# Patient Record
Sex: Male | Born: 1955 | Race: White | Hispanic: No | Smoking: Former smoker
Health system: Southern US, Community
[De-identification: ages and names within clinical notes are randomized; demographics above are authoritative.]

## PROBLEM LIST (undated history)

## (undated) DIAGNOSIS — E1142 Type 2 diabetes mellitus with diabetic polyneuropathy: Secondary | ICD-10-CM

## (undated) DIAGNOSIS — I509 Heart failure, unspecified: Secondary | ICD-10-CM

## (undated) DIAGNOSIS — N189 Chronic kidney disease, unspecified: Secondary | ICD-10-CM

## (undated) DIAGNOSIS — I219 Acute myocardial infarction, unspecified: Secondary | ICD-10-CM

## (undated) DIAGNOSIS — Z5189 Encounter for other specified aftercare: Secondary | ICD-10-CM

## (undated) DIAGNOSIS — E119 Type 2 diabetes mellitus without complications: Secondary | ICD-10-CM

## (undated) DIAGNOSIS — I10 Essential (primary) hypertension: Secondary | ICD-10-CM

## (undated) DIAGNOSIS — H269 Unspecified cataract: Secondary | ICD-10-CM

## (undated) DIAGNOSIS — E079 Disorder of thyroid, unspecified: Secondary | ICD-10-CM

## (undated) DIAGNOSIS — I4891 Unspecified atrial fibrillation: Secondary | ICD-10-CM

## (undated) DIAGNOSIS — Q249 Congenital malformation of heart, unspecified: Secondary | ICD-10-CM

## (undated) DIAGNOSIS — E7800 Pure hypercholesterolemia, unspecified: Secondary | ICD-10-CM

## (undated) HISTORY — DX: Disorder of thyroid, unspecified: E07.9

## (undated) HISTORY — DX: Essential (primary) hypertension: I10

## (undated) HISTORY — DX: Chronic kidney disease, unspecified: N18.9

## (undated) HISTORY — DX: Heart failure, unspecified: I50.9

## (undated) HISTORY — DX: Unspecified atrial fibrillation: I48.91

## (undated) HISTORY — DX: Pure hypercholesterolemia, unspecified: E78.00

## (undated) HISTORY — DX: Encounter for other specified aftercare: Z51.89

## (undated) HISTORY — DX: Acute myocardial infarction, unspecified: I21.9

## (undated) HISTORY — DX: Congenital malformation of heart, unspecified: Q24.9

## (undated) HISTORY — DX: Unspecified cataract: H26.9

## (undated) HISTORY — PX: OTHER SURGICAL HISTORY: SHX169

---

## 2016-07-07 ENCOUNTER — Emergency Department (HOSPITAL_COMMUNITY)
Admission: EM | Admit: 2016-07-07 | Discharge: 2016-07-07 | Disposition: A | Discharge status: Home / Self Care | Payer: BLUE CROSS/BLUE SHIELD | Attending: Emergency Medicine | Admitting: Emergency Medicine

## 2016-07-07 ENCOUNTER — Emergency Department (HOSPITAL_COMMUNITY): Payer: BLUE CROSS/BLUE SHIELD

## 2016-07-07 ENCOUNTER — Encounter (HOSPITAL_COMMUNITY): Payer: Self-pay | Admitting: Emergency Medicine

## 2016-07-07 DIAGNOSIS — Z7984 Long term (current) use of oral hypoglycemic drugs: Secondary | ICD-10-CM | POA: Insufficient documentation

## 2016-07-07 DIAGNOSIS — Z87891 Personal history of nicotine dependence: Secondary | ICD-10-CM | POA: Diagnosis not present

## 2016-07-07 DIAGNOSIS — M545 Low back pain, unspecified: Secondary | ICD-10-CM

## 2016-07-07 DIAGNOSIS — R739 Hyperglycemia, unspecified: Secondary | ICD-10-CM | POA: Insufficient documentation

## 2016-07-07 DIAGNOSIS — R109 Unspecified abdominal pain: Secondary | ICD-10-CM | POA: Diagnosis not present

## 2016-07-07 LAB — CBC WITH DIFFERENTIAL/PLATELET
BASOS PCT: 1 %
Basophils Absolute: 0.1 10*3/uL (ref 0.0–0.1)
EOS ABS: 0.3 10*3/uL (ref 0.0–0.7)
EOS PCT: 3 %
HCT: 42.5 % (ref 39.0–52.0)
Hemoglobin: 15.2 g/dL (ref 13.0–17.0)
LYMPHS PCT: 21 %
Lymphs Abs: 1.8 10*3/uL (ref 0.7–4.0)
MCH: 31.4 pg (ref 26.0–34.0)
MCHC: 35.8 g/dL (ref 30.0–36.0)
MCV: 87.8 fL (ref 78.0–100.0)
MONO ABS: 0.4 10*3/uL (ref 0.1–1.0)
Monocytes Relative: 5 %
Neutro Abs: 6.1 10*3/uL (ref 1.7–7.7)
Neutrophils Relative %: 70 %
PLATELETS: 203 10*3/uL (ref 150–400)
RBC: 4.84 MIL/uL (ref 4.22–5.81)
RDW: 12.9 % (ref 11.5–15.5)
WBC: 8.6 10*3/uL (ref 4.0–10.5)

## 2016-07-07 LAB — URINALYSIS, ROUTINE W REFLEX MICROSCOPIC
BACTERIA UA: NONE SEEN
BILIRUBIN URINE: NEGATIVE
Glucose, UA: >=500 mg/dL — AB
Ketones, ur: 5 mg/dL — AB
Leukocytes, UA: NEGATIVE
Nitrite: NEGATIVE
PROTEIN: NEGATIVE mg/dL
SPECIFIC GRAVITY, URINE: 1.029 (ref 1.005–1.030)
WBC UA: NONE SEEN WBC/hpf (ref 0–5)
pH: 5 (ref 5.0–8.0)

## 2016-07-07 LAB — BASIC METABOLIC PANEL
Anion gap: 14 (ref 5–15)
BUN: 19 mg/dL (ref 6–20)
CO2: 22 mmol/L (ref 22–32)
Calcium: 8.8 mg/dL — ABNORMAL LOW (ref 8.9–10.3)
Chloride: 91 mmol/L — ABNORMAL LOW (ref 101–111)
Creatinine, Ser: 1.07 mg/dL (ref 0.61–1.24)
GFR calc Af Amer: >60 mL/min (ref >60)
GLUCOSE: 494 mg/dL — AB (ref 65–99)
POTASSIUM: 4.3 mmol/L (ref 3.5–5.1)
SODIUM: 127 mmol/L — AB (ref 135–145)

## 2016-07-07 LAB — CBG MONITORING, ED
GLUCOSE-CAPILLARY: 430 mg/dL — AB (ref 65–99)
Glucose-Capillary: 503 mg/dL (ref 65–99)

## 2016-07-07 MED ORDER — KETOROLAC TROMETHAMINE 30 MG/ML IJ SOLN
15.0000 mg | Freq: Once | INTRAMUSCULAR | Status: AC
  Administered 2016-07-07: 15 mg via INTRAVENOUS
  Filled 2016-07-07: qty 1

## 2016-07-07 MED ORDER — ONDANSETRON HCL 4 MG/2ML IJ SOLN
4.0000 mg | Freq: Once | INTRAMUSCULAR | Status: AC
  Administered 2016-07-07: 4 mg via INTRAVENOUS
  Filled 2016-07-07: qty 2

## 2016-07-07 MED ORDER — SODIUM CHLORIDE 0.9 % IV BOLUS (SEPSIS)
1000.0000 mL | Freq: Once | INTRAVENOUS | Status: AC
  Administered 2016-07-07: 1000 mL via INTRAVENOUS

## 2016-07-07 MED ORDER — MORPHINE SULFATE (PF) 4 MG/ML IV SOLN
4.0000 mg | Freq: Once | INTRAVENOUS | Status: AC
Start: 2016-07-07 — End: 2016-07-07
  Administered 2016-07-07: 4 mg via INTRAVENOUS
  Filled 2016-07-07: qty 1

## 2016-07-07 MED ORDER — METFORMIN HCL 500 MG PO TABS: 500.0000 mg | ORAL_TABLET | Freq: Two times a day (BID) | ORAL | 0 refills | Status: DC

## 2016-07-07 NOTE — Discharge Instructions (Signed)
Your blood sugar was elevated here today. Please start taking the metformin 2 times a day. If it causes use severe upset stomach may reduced to 1 time a day. Make sure you are avoiding sugars and carbohydrates. As directed to follow-up with a primary care doctor this week or next week please try to make an appointment as soon as possible. CAT scan showed no signs of kidney stones. Your x-rays were normal. This could be nerve pain caused by her sciatica. Motrin and Tylenol for pain. Heating pad to the affected area. Warm compresses. May discuss further treatment with your primary care doctor. Return to the ED if he develops vomiting, fevers, lose control of her bowel or bladder, unable to urinate, numbness in her groin, numbness in her legs or for any reason.

## 2016-07-07 NOTE — ED Provider Notes (Signed)
[facility]EMERGENCY DEPT Provider Note   CSN: [id number] Arrival date & time: 07/07/16  4098     History   Chief Complaint Chief Complaint  Patient presents with  . Leg Pain    HPI LADELL LEA is a 61 y.o. male.  HPI 61 year old Caucasian male with no significant past medical history presents to the ED today with complaints of right-sided lower back pain that radiates to his right buttocks and down to his right thigh. Patient states that the pain started last night. States that it hurts to walk but does not appear his ambulation. He denies any known injury or trauma. He does not have a history of previous low back pain. Nothing makes better. Movement makes the pain worse. Reports associated urgency and frequency with mild irritation when urinating. Denies any hematuria. Patient describes the pain as a stabbing sensation that is constant. Patient denies any fever, chills, headache, vision changes, lightheadedness, dizziness, chest pain, shortness of breath, abdominal pain, nausea, emesis, change in bowel habits, melena, hematochezia, loss of bowel or bladder, urinary retention, saddle paresthesias, lower extremity paresthesias. Patient is able to ambulate with normal gait. Denies any history of IV drug use or cancer. History reviewed. No pertinent past medical history.  There are no active problems to display for this patient.   History reviewed. No pertinent surgical history.     Home Medications    Prior to Admission medications   Medication Sig Start Date End Date Taking? Authorizing Provider  ibuprofen (ADVIL,MOTRIN) 200 MG tablet Take 200 mg by mouth every 6 (six) hours as needed for mild pain.   Yes Historical Provider, MD  metFORMIN (GLUCOPHAGE) 500 MG tablet Take 1 tablet (500 mg total) by mouth 2 (two) times daily with a meal. 07/07/16   Rise Mu, PA-C    Family History No family history on file.  Social History Social History  Substance Use Topics  .  Smoking status: Former Smoker    Types: Cigarettes    Quit date: 64  . Smokeless tobacco: Never Used  . Alcohol use No     Allergies   Patient has no known allergies.   Review of Systems Review of Systems  Constitutional: Negative for chills and fever.  HENT: Negative for congestion.   Eyes: Negative for visual disturbance.  Respiratory: Negative for cough and shortness of breath.   Cardiovascular: Negative for chest pain, palpitations and leg swelling.  Gastrointestinal: Negative for abdominal pain, blood in stool, diarrhea, nausea and vomiting.  Endocrine: Positive for polydipsia and polyuria.  Genitourinary: Positive for frequency and urgency. Negative for dysuria, flank pain and hematuria.  Musculoskeletal: Positive for back pain. Negative for gait problem, neck pain and neck stiffness.  Skin: Negative for wound.  Neurological: Negative for dizziness, syncope, weakness, light-headedness, numbness and headaches.  All other systems reviewed and are negative.    Physical Exam Updated Vital Signs BP 118/56 (BP Location: Right Arm)   Pulse 82   Temp 97.9 F (36.6 C) (Oral)   Resp 16   Ht 5\' 3"  (1.6 m)   Wt 90.3 kg   SpO2 94%   BMI 35.25 kg/m   Physical Exam  Constitutional: He is oriented to person, place, and time. He appears well-developed and well-nourished. No distress.  Nontoxic appearing. Resting comfortably on the bed in no acute distress.  HENT:  Head: Normocephalic and atraumatic.  Mouth/Throat: Oropharynx is clear and moist.  Eyes: Conjunctivae are normal. Right eye exhibits no discharge. Left eye exhibits  no discharge. No scleral icterus.  Neck: Normal range of motion. Neck supple. No thyromegaly present.  Cardiovascular: Normal rate, regular rhythm, normal heart sounds and intact distal pulses.  Exam reveals no gallop and no friction rub.   No murmur heard. Pulmonary/Chest: Effort normal and breath sounds normal. No respiratory distress. He has no  wheezes. He has no rales. He exhibits no tenderness.  Abdominal: Soft. Bowel sounds are normal. He exhibits no distension. There is no tenderness. There is no rebound and no guarding.  No CVA tenderness.  Musculoskeletal: Normal range of motion.  Mild paraspinal tenderness that radiates down to his right buttock. However range of motion does not reproduce pain. Negative straight leg raise test on the right. No midline L-spine or T-spine tenderness. Full range of motion without any pain. No edema, ecchymosis, erythema to the lower sternum raise bilaterally. DP pulses are 2+ bilaterally. Cap refill normal. Sensation intact to sharp/total.  Lymphadenopathy:    He has no cervical adenopathy.  Neurological: He is alert and oriented to person, place, and time. He has normal strength and normal reflexes. No sensory deficit. GCS eye subscore is 4. GCS verbal subscore is 5. GCS motor subscore is 6.  Able to ambulate with normal gait and Romberg negative.  Skin: Skin is warm and dry. Capillary refill takes less than 2 seconds.  Nursing note and vitals reviewed.    ED Treatments / Results  Labs (all labs ordered are listed, but only abnormal results are displayed) Labs Reviewed  URINALYSIS, ROUTINE W REFLEX MICROSCOPIC - Abnormal; Notable for the following:       Result Value   Color, Urine STRAW (*)    Glucose, UA >=500 (*)    Hgb urine dipstick SMALL (*)    Ketones, ur 5 (*)    Squamous Epithelial / LPF 0-5 (*)    All other components within normal limits  BASIC METABOLIC PANEL - Abnormal; Notable for the following:    Sodium 127 (*)    Chloride 91 (*)    Glucose, Bld 494 (*)    Calcium 8.8 (*)    All other components within normal limits  CBG MONITORING, ED - Abnormal; Notable for the following:    Glucose-Capillary 503 (*)    All other components within normal limits  CBG MONITORING, ED - Abnormal; Notable for the following:    Glucose-Capillary 430 (*)    All other components within  normal limits  CBC WITH DIFFERENTIAL/PLATELET    EKG  EKG Interpretation None       Radiology Dg Lumbar Spine Complete  Result Date: 07/07/2016 CLINICAL DATA:  Lumbago with right-sided radicular symptoms EXAM: LUMBAR SPINE - COMPLETE 4+ VIEW COMPARISON:  None. FINDINGS: Frontal, lateral, spot lumbosacral lateral, and bilateral oblique views were obtained. There are 5 non-rib-bearing lumbar type vertebral bodies. S1 vertebra is transitional. There is no fracture or spondylolisthesis. There is mild disc space narrowing at L3-4. There are anterior osteophytes at all levels. There is facet osteoarthritic change at L4-5 and L5-S1 bilaterally. There are foci of atherosclerotic calcification in the aorta. IMPRESSION: Areas of osteoarthritic change. No fracture or spondylolisthesis. There is aortic atherosclerosis. Electronically Signed   By: Bretta BangWilliam  Woodruff III M.D.   On: 07/07/2016 07:57   Ct Renal Stone Study  Result Date: 07/07/2016 CLINICAL DATA:  Right-sided pain.  Evaluate for calculi EXAM: CT ABDOMEN AND PELVIS WITHOUT CONTRAST TECHNIQUE: Multidetector CT imaging of the abdomen and pelvis was performed following the standard protocol without IV  contrast. COMPARISON:  None. FINDINGS: Lower chest:  No contributory findings. Hepatobiliary: Hepatic steatosisNo evidence of biliary obstruction or stone. Pancreas: Unremarkable. Spleen: Unremarkable. Adrenals/Urinary Tract: Negative adrenals. No hydronephrosis or stone. Symmetric nonspecific perinephric stranding. Unremarkable bladder. Stomach/Bowel:  No obstruction. No appendicitis. Vascular/Lymphatic: No acute vascular abnormality. No mass or adenopathy. Reproductive:No pathologic findings. Other: No ascites or pneumoperitoneum. Musculoskeletal: No acute abnormalities. Spondylosis and low lumbar facet arthropathy. IMPRESSION: 1. No acute finding.  No urolithiasis or appendicitis. 2. Hepatic steatosis. Electronically Signed   By: Marnee Spring M.D.    On: 07/07/2016 09:25    Procedures Procedures (including critical care time)  Medications Ordered in ED Medications  morphine 4 MG/ML injection 4 mg (4 mg Intravenous Given 07/07/16 0905)  ondansetron (ZOFRAN) injection 4 mg (4 mg Intravenous Given 07/07/16 0905)  sodium chloride 0.9 % bolus 1,000 mL (0 mLs Intravenous Stopped 07/07/16 1041)  sodium chloride 0.9 % bolus 1,000 mL (0 mLs Intravenous Stopped 07/07/16 1215)  ketorolac (TORADOL) 30 MG/ML injection 15 mg (15 mg Intravenous Given 07/07/16 1214)     Initial Impression / Assessment and Plan / ED Course  I have reviewed the triage vital signs and the nursing notes.  Pertinent labs & imaging results that were available during my care of the patient were reviewed by me and considered in my medical decision making (see chart for details).     Patient presents to the ED with complaints of right lower back pain that radiates to his right buttocks into his right thigh. No associated trauma. No associated edema of the right lower extremity. Patient is able to ambulate but with mild pain. Range of motion does not reproduce pain. Abdominal exam is benign. Lumbar x-ray shows no acute abnormalities. No neurological deficits and normal neuro exam.  Patient can walk but states is painful.  No loss of bowel or bladder control.  No concern for cauda equina.  No fever, night sweats, weight loss, h/o cancer, IVDU. Urine with glucose small amount of hemoglobin and ketones. No signs of urinary tract infection. Patient does not have a history of diabetes. Ordered CBG that showed to be 500. This seems to be new onset for patient. Denies any abdominal pain, nausea, emesis. Does report mild increase in urinary urgency and frequency. Basic labs were obtained to assess for DKA. Glucose 494. Hyponatremia noted however corrected with the hyperglycemia is 133. No leukocytosis noted. Creatinine normal. Anion gap normal at 14. Bicarbonate normal at 22. Doubt DKA. All  other lab normal. Patient given 2 fluid boluses. Patient was given pain medicine was playing resolution in his symptoms. Patient was assessed by Dr. Tanna Savoy who recommended CT renal study due to patient's acute onset and does not have any pain with range of motion of his right hip. CT scan shows no acute abdomen allergies. No concern for kidney stone, appendicitis, metastases to the lumbar region, prostate cancer. Mild improvement of patient's blood sugar after 2 boluses. We'll start patient on metformin 2 times a day. Patient seen and evaluated by Dr. Tanna Savoy who was agreeable with the above plan. Patient is able to cannulate. Feels much improved. We'll give small dose of Toradol in case low back pain is due to radiculopathy. RICE protocol and pain medicine indicated and discussed with patient.  Have encouraged patient to find a high rate care doctor the next 1-2 weeks and follow-up. Have given her strict return precautions. He verbalized understanding the plan of care. He'll stable for discharge. All questions rancher prior  to discharge.      Final Clinical Impressions(s) / ED Diagnoses   Final diagnoses:  Acute right-sided low back pain without sciatica  Elevated blood sugar    New Prescriptions New Prescriptions   METFORMIN (GLUCOPHAGE) 500 MG TABLET    Take 1 tablet (500 mg total) by mouth 2 (two) times daily with a meal.     Rise Mu, PA-C 07/07/16 1240    Gwyneth Sprout, MD 07/07/16 2039

## 2016-07-07 NOTE — ED Triage Notes (Addendum)
Pt from home reports stabbing right sided leg and right buttock/lower back pain that started last night. Pt denies injury. Pt reports it hurts to walk but doesn't impair ambulation. Pt reports some irritation and difficulty when urinating.

## 2016-07-07 NOTE — ED Notes (Signed)
Patient transported to X-ray 

## 2016-07-08 ENCOUNTER — Encounter (HOSPITAL_COMMUNITY): Payer: Self-pay | Admitting: Emergency Medicine

## 2016-07-08 ENCOUNTER — Emergency Department (HOSPITAL_BASED_OUTPATIENT_CLINIC_OR_DEPARTMENT_OTHER)
Admit: 2016-07-08 | Discharge: 2016-07-08 | Disposition: A | Discharge status: Home / Self Care | Payer: BLUE CROSS/BLUE SHIELD

## 2016-07-08 ENCOUNTER — Emergency Department (HOSPITAL_COMMUNITY)
Admission: EM | Admit: 2016-07-08 | Discharge: 2016-07-08 | Disposition: A | Discharge status: Home / Self Care | Payer: BLUE CROSS/BLUE SHIELD | Attending: Emergency Medicine | Admitting: Emergency Medicine

## 2016-07-08 DIAGNOSIS — M79609 Pain in unspecified limb: Secondary | ICD-10-CM | POA: Diagnosis not present

## 2016-07-08 DIAGNOSIS — M79651 Pain in right thigh: Secondary | ICD-10-CM | POA: Insufficient documentation

## 2016-07-08 DIAGNOSIS — M79604 Pain in right leg: Secondary | ICD-10-CM

## 2016-07-08 DIAGNOSIS — Z7984 Long term (current) use of oral hypoglycemic drugs: Secondary | ICD-10-CM | POA: Diagnosis not present

## 2016-07-08 DIAGNOSIS — Z79899 Other long term (current) drug therapy: Secondary | ICD-10-CM | POA: Insufficient documentation

## 2016-07-08 DIAGNOSIS — Z87891 Personal history of nicotine dependence: Secondary | ICD-10-CM | POA: Diagnosis not present

## 2016-07-08 DIAGNOSIS — E119 Type 2 diabetes mellitus without complications: Secondary | ICD-10-CM | POA: Diagnosis not present

## 2016-07-08 MED ORDER — KETOROLAC TROMETHAMINE 60 MG/2ML IM SOLN
30.0000 mg | Freq: Once | INTRAMUSCULAR | Status: AC
  Administered 2016-07-08: 30 mg via INTRAMUSCULAR
  Filled 2016-07-08: qty 2

## 2016-07-08 NOTE — ED Notes (Signed)
Pt states he is ready to leave and is upset by how long he has been waiting. Dr. Moody BruinsIrick aware.

## 2016-07-08 NOTE — ED Notes (Signed)
Patient transported back from Ultrasound 

## 2016-07-08 NOTE — ED Notes (Signed)
Patient transported to Ultrasound 

## 2016-07-08 NOTE — Progress Notes (Signed)
*  PRELIMINARY RESULTS* Vascular Ultrasound Right lower extremity venous duplex has been completed.  Preliminary findings: No evidence of DVT or baker's cyst.   Farrel DemarkJill Eunice, RDMS, RVT  07/08/2016, 6:34 PM

## 2016-07-08 NOTE — ED Triage Notes (Addendum)
Rt groin pain was seen for same yeasterday was told to come back if it returned and it did states feels a swelling there today that was not there yesterday

## 2016-07-08 NOTE — ED Provider Notes (Signed)
[facility]PT Provider Note   CSN: [id number] Arrival date & time: 07/08/16  1339     History   Chief Complaint Chief Complaint  Patient presents with  . Groin Pain    HPI Antonio Cervantes is a 61 y.o. male.  HPI  61 year old male presenting with pain in his right thigh. Of note, patient was seen in the ED yesterday for similar symptoms. At that time he was diagnosed with new onset diabetes and started on metformin. States he's been taking this as prescribed. Yesterday his symptoms were thought to be secondary to a pinched nerve. He had a CT abdomen and pelvis that was negative, had no red flag symptoms of back pain, and was discharged with outpatient follow-up.  Reports that the pain initially started yesterday. Described as starting in the back, shooting down the front of the right thigh. Described as a severe, sharp, electrical sensation that wakes him up. Pain is constant. No numbness or tingling. Denies history of back trauma or back surgeries. No fevers or chills. Denies bowel or bladder incontinence or saddle anesthesia. He states that he initially felt well after leaving the ED yesterday, but once the medicine he was given wore off, the pain restarted so he decided to come back. He is also concerned that he may have a blood clot as he has a friend recently died from this. He denies history of DVT or PE.  History reviewed. No pertinent past medical history.  There are no active problems to display for this patient.   History reviewed. No pertinent surgical history.     Home Medications    Prior to Admission medications   Medication Sig Start Date End Date Taking? Authorizing Provider  ibuprofen (ADVIL,MOTRIN) 200 MG tablet Take 400 mg by mouth every 6 (six) hours as needed (for pain).    Yes Historical Provider, MD  metFORMIN (GLUCOPHAGE) 500 MG tablet Take 1 tablet (500 mg total) by mouth 2 (two) times daily with a meal. 07/07/16  Yes Rise MuKenneth T Leaphart, PA-C     Family History No family history on file.  Social History Social History  Substance Use Topics  . Smoking status: Former Smoker    Types: Cigarettes    Quit date: [id number]  . Smokeless tobacco: Never Used  . Alcohol use No     Allergies   Patient has no known allergies.   Review of Systems Review of Systems  Constitutional: Negative for chills and fever.  HENT: Negative for congestion.   Eyes: Negative for visual disturbance.  Respiratory: Negative for cough and shortness of breath.   Cardiovascular: Negative for chest pain and leg swelling.  Gastrointestinal: Negative for abdominal pain, nausea and vomiting.  Genitourinary: Negative for difficulty urinating, dysuria and frequency.  Musculoskeletal: Positive for back pain (R lower back) and myalgias (R thigh). Negative for arthralgias, gait problem, joint swelling, neck pain and neck stiffness.  Skin: Negative for rash.  Neurological: Negative for dizziness, weakness, light-headedness and headaches.  Psychiatric/Behavioral: Negative for agitation, behavioral problems and confusion.     Physical Exam Updated Vital Signs BP 153/72   Pulse 76   Temp 98.9 F (37.2 C) (Oral)   Resp 16   SpO2 96%   Physical Exam  Constitutional: He is oriented to person, place, and time. He appears well-developed and well-nourished. No distress.  HENT:  Head: Normocephalic and atraumatic.  Right Ear: External ear normal.  Left Ear: External ear normal.  Nose: Nose normal.  Eyes: Conjunctivae are  normal. Pupils are equal, round, and reactive to light. Right eye exhibits no discharge. Left eye exhibits no discharge. No scleral icterus.  Neck: Normal range of motion. Neck supple.  Cardiovascular: Normal rate, regular rhythm, normal heart sounds and intact distal pulses.  Exam reveals no gallop and no friction rub.   No murmur heard. Pulmonary/Chest: Effort normal and breath sounds normal. No respiratory distress. He has no wheezes. He  has no rales.  Abdominal: Soft. Bowel sounds are normal. He exhibits no distension. There is no tenderness.  Musculoskeletal:  TTP over the R perispinous muscles of the lower lumbar spine. No midline TTP over the C, T, or L spine. TTP over the anterior R thigh. TTP of the R groin, with no palpable inguinal hernias.   Neurological: He is alert and oriented to person, place, and time. He exhibits normal muscle tone.  Intact sensation to light touch in the R lower extremity, with no saddle anesthesia.   Skin: Skin is warm and dry. He is not diaphoretic.  Psychiatric: He has a normal mood and affect. His behavior is normal. Judgment and thought content normal.     ED Treatments / Results  Labs (all labs ordered are listed, but only abnormal results are displayed) Labs Reviewed - No data to display  EKG  EKG Interpretation None       Radiology Dg Lumbar Spine Complete  Result Date: 07/07/2016 CLINICAL DATA:  Lumbago with right-sided radicular symptoms EXAM: LUMBAR SPINE - COMPLETE 4+ VIEW COMPARISON:  None. FINDINGS: Frontal, lateral, spot lumbosacral lateral, and bilateral oblique views were obtained. There are 5 non-rib-bearing lumbar type vertebral bodies. S1 vertebra is transitional. There is no fracture or spondylolisthesis. There is mild disc space narrowing at L3-4. There are anterior osteophytes at all levels. There is facet osteoarthritic change at L4-5 and L5-S1 bilaterally. There are foci of atherosclerotic calcification in the aorta. IMPRESSION: Areas of osteoarthritic change. No fracture or spondylolisthesis. There is aortic atherosclerosis. Electronically Signed   By: Bretta Bang III M.D.   On: 07/07/2016 07:57   Ct Renal Stone Study  Result Date: 07/07/2016 CLINICAL DATA:  Right-sided pain.  Evaluate for calculi EXAM: CT ABDOMEN AND PELVIS WITHOUT CONTRAST TECHNIQUE: Multidetector CT imaging of the abdomen and pelvis was performed following the standard protocol without  IV contrast. COMPARISON:  None. FINDINGS: Lower chest:  No contributory findings. Hepatobiliary: Hepatic steatosisNo evidence of biliary obstruction or stone. Pancreas: Unremarkable. Spleen: Unremarkable. Adrenals/Urinary Tract: Negative adrenals. No hydronephrosis or stone. Symmetric nonspecific perinephric stranding. Unremarkable bladder. Stomach/Bowel:  No obstruction. No appendicitis. Vascular/Lymphatic: No acute vascular abnormality. No mass or adenopathy. Reproductive:No pathologic findings. Other: No ascites or pneumoperitoneum. Musculoskeletal: No acute abnormalities. Spondylosis and low lumbar facet arthropathy. IMPRESSION: 1. No acute finding.  No urolithiasis or appendicitis. 2. Hepatic steatosis. Electronically Signed   By: Marnee Spring M.D.   On: 07/07/2016 09:25    Procedures Procedures (including critical care time)  Medications Ordered in ED Medications  ketorolac (TORADOL) injection 30 mg (30 mg Intramuscular Given 07/08/16 1740)     Initial Impression / Assessment and Plan / ED Course  I have reviewed the triage vital signs and the nursing notes.  Pertinent labs & imaging results that were available during my care of the patient were reviewed by me and considered in my medical decision making (see chart for details).     Generally well-appearing. Afebrile and hemodynamically stable. Report of pain that begins in the right lower back and shoots down  the right leg is most consistent with radicular pain, however patient expresses concern for DVT and does have some tenderness to palpation of the right groin. No palpable inguinal hernias. Ultrasound of the right lower extremity ordered. Patient eloped prior to me being able to discuss these results with him in the emergency department, but I was able to find him out in the waiting room, and let him know that his study was negative. I encouraged him to follow up with his primary care physician for referral to physical therapy or a  spine doctor if his symptoms worsen, and to return to the emergency department for any bowel or bladder incontinence, saddle anesthesia, lower extremity weakness, or sudden worsening of his pain, none of which are present today. I have a low suspicion for cord compression or cauda equina.   Care of patient overseen by my attending, Dr. Rhunette Croft.   Final Clinical Impressions(s) / ED Diagnoses   Final diagnoses:  Leg pain, anterior, right    New Prescriptions Discharge Medication List as of 07/08/2016  8:42 PM       Jethro Radke Ernestina Penna, MD 07/09/16 1610    Derwood Kaplan, MD 07/10/16 [phone]

## 2016-07-08 NOTE — Progress Notes (Addendum)
Spoke to RN about vascular order. It was ordered routine and vascular tech not paged, but RN states this does need to be done today. Will get to patient next. 6:05PM  Farrel DemarkJill Eunice, RDMS, RVT 07/08/2016 Vascular lab

## 2016-07-08 NOTE — ED Notes (Signed)
Pt states he understands the risks of leaving without results and further treatment.

## 2016-07-14 ENCOUNTER — Emergency Department (HOSPITAL_COMMUNITY)
Admission: EM | Admit: 2016-07-14 | Discharge: 2016-07-14 | Disposition: A | Discharge status: Home / Self Care | Payer: BLUE CROSS/BLUE SHIELD | Attending: Emergency Medicine | Admitting: Emergency Medicine

## 2016-07-14 ENCOUNTER — Encounter (HOSPITAL_COMMUNITY): Payer: Self-pay

## 2016-07-14 DIAGNOSIS — Z7984 Long term (current) use of oral hypoglycemic drugs: Secondary | ICD-10-CM | POA: Diagnosis not present

## 2016-07-14 DIAGNOSIS — M5416 Radiculopathy, lumbar region: Secondary | ICD-10-CM | POA: Diagnosis not present

## 2016-07-14 DIAGNOSIS — E119 Type 2 diabetes mellitus without complications: Secondary | ICD-10-CM | POA: Insufficient documentation

## 2016-07-14 DIAGNOSIS — R109 Unspecified abdominal pain: Secondary | ICD-10-CM

## 2016-07-14 DIAGNOSIS — Z87891 Personal history of nicotine dependence: Secondary | ICD-10-CM | POA: Diagnosis not present

## 2016-07-14 DIAGNOSIS — M549 Dorsalgia, unspecified: Secondary | ICD-10-CM | POA: Diagnosis present

## 2016-07-14 HISTORY — DX: Type 2 diabetes mellitus without complications: E11.9

## 2016-07-14 LAB — URINALYSIS, MICROSCOPIC (REFLEX)
RBC / HPF: NONE SEEN RBC/hpf (ref 0–5)
WBC UA: NONE SEEN WBC/hpf (ref 0–5)

## 2016-07-14 LAB — URINALYSIS, ROUTINE W REFLEX MICROSCOPIC
BILIRUBIN URINE: NEGATIVE
Hgb urine dipstick: NEGATIVE
KETONES UR: 15 mg/dL — AB
LEUKOCYTES UA: NEGATIVE
NITRITE: NEGATIVE
PH: 5.5 (ref 5.0–8.0)
Protein, ur: NEGATIVE mg/dL
SPECIFIC GRAVITY, URINE: 1.01 (ref 1.005–1.030)

## 2016-07-14 MED ORDER — ACETAMINOPHEN 325 MG PO TABS: 650.0000 mg | ORAL_TABLET | Freq: Four times a day (QID) | ORAL | 0 refills | Status: DC | PRN

## 2016-07-14 MED ORDER — LIDOCAINE 5 % EX OINT
1.0000 | TOPICAL_OINTMENT | CUTANEOUS | 0 refills | Status: DC | PRN
Start: 2016-07-14 — End: 2021-05-23

## 2016-07-14 MED ORDER — LIDOCAINE 5 % EX PTCH
1.0000 | MEDICATED_PATCH | CUTANEOUS | Status: DC
  Administered 2016-07-14: 1 via TRANSDERMAL
  Filled 2016-07-14: qty 1

## 2016-07-14 MED ORDER — NAPROXEN 250 MG PO TABS
500.0000 mg | ORAL_TABLET | Freq: Once | ORAL | Status: AC
  Administered 2016-07-14: 500 mg via ORAL
  Filled 2016-07-14: qty 2

## 2016-07-14 MED ORDER — ACETAMINOPHEN 325 MG PO TABS
650.0000 mg | ORAL_TABLET | Freq: Once | ORAL | Status: AC
  Administered 2016-07-14: 650 mg via ORAL
  Filled 2016-07-14: qty 2

## 2016-07-14 MED ORDER — IBUPROFEN 200 MG PO TABS: 400.0000 mg | ORAL_TABLET | Freq: Four times a day (QID) | ORAL | 0 refills | Status: DC | PRN

## 2016-07-14 NOTE — ED Triage Notes (Signed)
PT is having RIGHT flank/back pain.He was seen multiple times this week for same. He reports pain is sharp and constant. PT states the pain radiates down right leg. Endorses burning with urination

## 2016-07-14 NOTE — Discharge Instructions (Signed)
Likely have lumbar impingement syndrome. Take the medicine prescribed. See your primary care doctor for further evaluation.

## 2016-07-14 NOTE — ED Provider Notes (Signed)
[facility]-EMERGENCY DEPT Provider Note   CSN: [id number] Arrival date & time: 07/14/16  0455     History   Chief Complaint Chief Complaint  Patient presents with  . Back Pain    HPI Antonio Cervantes is a 61 y.o. male.  HPI Pt comes in with cc of back pain. Pt reports that he has been having back pain, R sided for a while now. Pt has pain in the flank region and has pain radiating to the hip and sometimes even down his leg. Pain is constant. No specific aggravating or relieving factors. Pt has no n/v/f/c. Pt has some urinary burning, but he reports it is intermittent.  Pt has no associated numbness, weakness, urinary incontinence, urinary retention, bowel incontinence, pins and needle sensation in the perineal area.    Past Medical History:  Diagnosis Date  . Diabetes mellitus without complication (HCC)     There are no active problems to display for this patient.   History reviewed. No pertinent surgical history.     Home Medications    Prior to Admission medications   Medication Sig Start Date End Date Taking? Authorizing Provider  metFORMIN (GLUCOPHAGE) 500 MG tablet Take 1 tablet (500 mg total) by mouth 2 (two) times daily with a meal. 07/07/16  Yes Rise MuKenneth T Leaphart, PA-C  naproxen (NAPROSYN) 500 MG tablet Take 500 mg by mouth 2 (two) times daily with a meal.   Yes Historical Provider, MD  acetaminophen (TYLENOL) 325 MG tablet Take 2 tablets (650 mg total) by mouth every 6 (six) hours as needed. 07/14/16   Derwood KaplanAnkit Denym Rahimi, MD  ibuprofen (ADVIL,MOTRIN) 200 MG tablet Take 2 tablets (400 mg total) by mouth every 6 (six) hours as needed (for pain). 07/14/16   Derwood KaplanAnkit Kurk Corniel, MD  lidocaine (XYLOCAINE) 5 % ointment Apply 1 application topically as needed. 07/14/16   Derwood KaplanAnkit Adalina Dopson, MD    Family History No family history on file.  Social History Social History  Substance Use Topics  . Smoking status: Former Smoker    Types: Cigarettes    Quit date: [id number]  . Smokeless  tobacco: Never Used  . Alcohol use No     Allergies   Patient has no known allergies.   Review of Systems Review of Systems  Gastrointestinal: Negative for nausea and vomiting.  Musculoskeletal: Positive for back pain.   ROS 10 Systems reviewed and are negative for acute change except as noted in the HPI.      Physical Exam Updated Vital Signs BP (!) 150/75   Pulse 64   Temp 97.7 F (36.5 C) (Oral)   Resp 16   Ht 5\' 3"  (1.6 m)   Wt 199 lb (90.3 kg)   SpO2 95%   BMI 35.25 kg/m   Physical Exam  Constitutional: He is oriented to person, place, and time. He appears well-developed.  HENT:  Head: Normocephalic and atraumatic.  Eyes: Conjunctivae and EOM are normal. Pupils are equal, round, and reactive to light.  Neck: Normal range of motion. Neck supple.  Cardiovascular: Normal rate and regular rhythm.   Pulmonary/Chest: Effort normal and breath sounds normal.  Abdominal: Soft. Bowel sounds are normal. He exhibits no distension and no mass. There is no tenderness. There is no rebound and no guarding.  No reproducible flank tenderness  Genitourinary: Prostate normal.  Genitourinary Comments: Intact rectal tone  Musculoskeletal: He exhibits no deformity.  Pt has tenderness over the lumbar region No step offs, no erythema. Pt has 2+ patellar reflex  bilaterally. Able to discriminate between sharp and dull. Able to ambulate  + passive straight leg raise  Neurological: He is alert and oriented to person, place, and time. No cranial nerve deficit.  Skin: Skin is warm.  Nursing note and vitals reviewed.    ED Treatments / Results  Labs (all labs ordered are listed, but only abnormal results are displayed) Labs Reviewed  URINALYSIS, ROUTINE W REFLEX MICROSCOPIC - Abnormal; Notable for the following:       Result Value   Glucose, UA >=500 (*)    Ketones, ur 15 (*)    All other components within normal limits  URINALYSIS, MICROSCOPIC (REFLEX) - Abnormal; Notable  for the following:    Bacteria, UA RARE (*)    Squamous Epithelial / LPF 0-5 (*)    All other components within normal limits  URINE CULTURE    EKG  EKG Interpretation None       Radiology No results found.  Procedures Procedures (including critical care time)  Medications Ordered in ED Medications  lidocaine (LIDODERM) 5 % 1 patch (1 patch Transdermal Patch Applied 07/14/16 0550)  naproxen (NAPROSYN) tablet 500 mg (500 mg Oral Given 07/14/16 0550)  acetaminophen (TYLENOL) tablet 650 mg (650 mg Oral Given 07/14/16 0550)     Initial Impression / Assessment and Plan / ED Course  I have reviewed the triage vital signs and the nursing notes.  Pertinent labs & imaging results that were available during my care of the patient were reviewed by me and considered in my medical decision making (see chart for details).     Pt comes in with cc of back pain. There is some component of impingement present. 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 Pt also has some urinary complains intermittently - will check UA. Pt's prostate exam was normal.  Advised PCP f/u.  Final Clinical Impressions(s) / ED Diagnoses   Final diagnoses:  Lumbar nerve root impingement  Flank pain    New Prescriptions New Prescriptions   ACETAMINOPHEN (TYLENOL) 325 MG TABLET    Take 2 tablets (650 mg total) by mouth every 6 (six) hours as needed.   LIDOCAINE (XYLOCAINE) 5 % OINTMENT    Apply 1 application topically as needed.     Derwood Kaplan, MD 07/14/16 [phone]

## 2016-07-15 LAB — URINE CULTURE: CULTURE: NO GROWTH

## 2021-05-19 ENCOUNTER — Inpatient Hospital Stay (HOSPITAL_COMMUNITY)
Admission: EM | Admit: 2021-05-19 | Discharge: 2021-05-23 | DRG: 280 | Disposition: A | Discharge status: Home / Self Care | Payer: Medicare Other | Attending: Internal Medicine | Admitting: Internal Medicine

## 2021-05-19 ENCOUNTER — Emergency Department (HOSPITAL_COMMUNITY): Payer: Medicare Other

## 2021-05-19 ENCOUNTER — Encounter (HOSPITAL_COMMUNITY)
Admission: EM | Disposition: A | Discharge status: Home / Self Care | Payer: Self-pay | Source: Home / Self Care | Attending: Internal Medicine

## 2021-05-19 ENCOUNTER — Encounter (HOSPITAL_COMMUNITY): Payer: Self-pay | Admitting: Internal Medicine

## 2021-05-19 DIAGNOSIS — I499 Cardiac arrhythmia, unspecified: Secondary | ICD-10-CM | POA: Diagnosis not present

## 2021-05-19 DIAGNOSIS — E119 Type 2 diabetes mellitus without complications: Secondary | ICD-10-CM

## 2021-05-19 DIAGNOSIS — Z20822 Contact with and (suspected) exposure to covid-19: Secondary | ICD-10-CM | POA: Diagnosis present

## 2021-05-19 DIAGNOSIS — I2510 Atherosclerotic heart disease of native coronary artery without angina pectoris: Secondary | ICD-10-CM | POA: Diagnosis not present

## 2021-05-19 DIAGNOSIS — E871 Hypo-osmolality and hyponatremia: Secondary | ICD-10-CM | POA: Diagnosis present

## 2021-05-19 DIAGNOSIS — N179 Acute kidney failure, unspecified: Secondary | ICD-10-CM | POA: Diagnosis not present

## 2021-05-19 DIAGNOSIS — E1151 Type 2 diabetes mellitus with diabetic peripheral angiopathy without gangrene: Secondary | ICD-10-CM | POA: Diagnosis not present

## 2021-05-19 DIAGNOSIS — E1142 Type 2 diabetes mellitus with diabetic polyneuropathy: Secondary | ICD-10-CM

## 2021-05-19 DIAGNOSIS — I517 Cardiomegaly: Secondary | ICD-10-CM | POA: Diagnosis not present

## 2021-05-19 DIAGNOSIS — E1122 Type 2 diabetes mellitus with diabetic chronic kidney disease: Secondary | ICD-10-CM | POA: Diagnosis present

## 2021-05-19 DIAGNOSIS — J810 Acute pulmonary edema: Secondary | ICD-10-CM | POA: Diagnosis present

## 2021-05-19 DIAGNOSIS — J9601 Acute respiratory failure with hypoxia: Secondary | ICD-10-CM

## 2021-05-19 DIAGNOSIS — E785 Hyperlipidemia, unspecified: Secondary | ICD-10-CM | POA: Diagnosis present

## 2021-05-19 DIAGNOSIS — I214 Non-ST elevation (NSTEMI) myocardial infarction: Secondary | ICD-10-CM | POA: Diagnosis not present

## 2021-05-19 DIAGNOSIS — N183 Chronic kidney disease, stage 3 unspecified: Secondary | ICD-10-CM | POA: Diagnosis not present

## 2021-05-19 DIAGNOSIS — R0789 Other chest pain: Secondary | ICD-10-CM | POA: Diagnosis not present

## 2021-05-19 DIAGNOSIS — E1169 Type 2 diabetes mellitus with other specified complication: Secondary | ICD-10-CM | POA: Diagnosis not present

## 2021-05-19 DIAGNOSIS — I4891 Unspecified atrial fibrillation: Secondary | ICD-10-CM | POA: Diagnosis not present

## 2021-05-19 DIAGNOSIS — Z743 Need for continuous supervision: Secondary | ICD-10-CM | POA: Diagnosis not present

## 2021-05-19 DIAGNOSIS — R6889 Other general symptoms and signs: Secondary | ICD-10-CM | POA: Diagnosis not present

## 2021-05-19 DIAGNOSIS — I110 Hypertensive heart disease with heart failure: Secondary | ICD-10-CM | POA: Diagnosis not present

## 2021-05-19 DIAGNOSIS — I440 Atrioventricular block, first degree: Secondary | ICD-10-CM | POA: Diagnosis present

## 2021-05-19 DIAGNOSIS — M109 Gout, unspecified: Secondary | ICD-10-CM | POA: Diagnosis present

## 2021-05-19 DIAGNOSIS — E039 Hypothyroidism, unspecified: Secondary | ICD-10-CM | POA: Diagnosis present

## 2021-05-19 DIAGNOSIS — R0602 Shortness of breath: Secondary | ICD-10-CM | POA: Diagnosis not present

## 2021-05-19 DIAGNOSIS — Z79899 Other long term (current) drug therapy: Secondary | ICD-10-CM

## 2021-05-19 DIAGNOSIS — Z87891 Personal history of nicotine dependence: Secondary | ICD-10-CM

## 2021-05-19 DIAGNOSIS — I5040 Unspecified combined systolic (congestive) and diastolic (congestive) heart failure: Secondary | ICD-10-CM | POA: Diagnosis not present

## 2021-05-19 DIAGNOSIS — I509 Heart failure, unspecified: Secondary | ICD-10-CM

## 2021-05-19 DIAGNOSIS — I5021 Acute systolic (congestive) heart failure: Secondary | ICD-10-CM | POA: Diagnosis not present

## 2021-05-19 DIAGNOSIS — D72829 Elevated white blood cell count, unspecified: Secondary | ICD-10-CM | POA: Diagnosis present

## 2021-05-19 DIAGNOSIS — I480 Paroxysmal atrial fibrillation: Secondary | ICD-10-CM

## 2021-05-19 DIAGNOSIS — R079 Chest pain, unspecified: Secondary | ICD-10-CM | POA: Diagnosis not present

## 2021-05-19 DIAGNOSIS — I5043 Acute on chronic combined systolic (congestive) and diastolic (congestive) heart failure: Secondary | ICD-10-CM | POA: Diagnosis not present

## 2021-05-19 DIAGNOSIS — I4892 Unspecified atrial flutter: Secondary | ICD-10-CM | POA: Diagnosis not present

## 2021-05-19 HISTORY — DX: Type 2 diabetes mellitus with diabetic polyneuropathy: E11.42

## 2021-05-19 LAB — LIPID PANEL
Cholesterol: 204 mg/dL — ABNORMAL HIGH (ref 0–200)
HDL: 25 mg/dL — ABNORMAL LOW (ref >40)
LDL Cholesterol: 148 mg/dL — ABNORMAL HIGH (ref 0–99)
Total CHOL/HDL Ratio: 8.2 RATIO
Triglycerides: 154 mg/dL — ABNORMAL HIGH (ref <150)
VLDL: 31 mg/dL (ref 0–40)

## 2021-05-19 LAB — COMPREHENSIVE METABOLIC PANEL
ALT: 11 U/L (ref 0–44)
AST: 16 U/L (ref 15–41)
Albumin: 3.5 g/dL (ref 3.5–5.0)
Alkaline Phosphatase: 50 U/L (ref 38–126)
Anion gap: 9 (ref 5–15)
BUN: 15 mg/dL (ref 8–23)
CO2: 18 mmol/L — ABNORMAL LOW (ref 22–32)
Calcium: 8.4 mg/dL — ABNORMAL LOW (ref 8.9–10.3)
Chloride: 103 mmol/L (ref 98–111)
Creatinine, Ser: 1.4 mg/dL — ABNORMAL HIGH (ref 0.61–1.24)
GFR, Estimated: 55 mL/min — ABNORMAL LOW (ref >60)
Glucose, Bld: 283 mg/dL — ABNORMAL HIGH (ref 70–99)
Potassium: 4.4 mmol/L (ref 3.5–5.1)
Sodium: 130 mmol/L — ABNORMAL LOW (ref 135–145)
Total Bilirubin: 0.5 mg/dL (ref 0.3–1.2)
Total Protein: 6.9 g/dL (ref 6.5–8.1)

## 2021-05-19 LAB — CBC WITH DIFFERENTIAL/PLATELET
Abs Immature Granulocytes: 0.04 10*3/uL (ref 0.00–0.07)
Basophils Absolute: 0.2 10*3/uL — ABNORMAL HIGH (ref 0.0–0.1)
Basophils Relative: 2 %
Eosinophils Absolute: 0.6 10*3/uL — ABNORMAL HIGH (ref 0.0–0.5)
Eosinophils Relative: 5 %
HCT: 42.3 % (ref 39.0–52.0)
Hemoglobin: 13.4 g/dL (ref 13.0–17.0)
Immature Granulocytes: 0 %
Lymphocytes Relative: 31 %
Lymphs Abs: 4.1 10*3/uL — ABNORMAL HIGH (ref 0.7–4.0)
MCH: 30.2 pg (ref 26.0–34.0)
MCHC: 31.7 g/dL (ref 30.0–36.0)
MCV: 95.5 fL (ref 80.0–100.0)
Monocytes Absolute: 0.8 10*3/uL (ref 0.1–1.0)
Monocytes Relative: 6 %
Neutro Abs: 7.4 10*3/uL (ref 1.7–7.7)
Neutrophils Relative %: 56 %
Platelets: 285 10*3/uL (ref 150–400)
RBC: 4.43 MIL/uL (ref 4.22–5.81)
RDW: 13.6 % (ref 11.5–15.5)
WBC: 13.2 10*3/uL — ABNORMAL HIGH (ref 4.0–10.5)
nRBC: 0 % (ref 0.0–0.2)

## 2021-05-19 LAB — I-STAT CHEM 8, ED
BUN: 16 mg/dL (ref 8–23)
Calcium, Ion: 1.05 mmol/L — ABNORMAL LOW (ref 1.15–1.40)
Chloride: 103 mmol/L (ref 98–111)
Creatinine, Ser: 1.3 mg/dL — ABNORMAL HIGH (ref 0.61–1.24)
Glucose, Bld: 274 mg/dL — ABNORMAL HIGH (ref 70–99)
HCT: 43 % (ref 39.0–52.0)
Hemoglobin: 14.6 g/dL (ref 13.0–17.0)
Potassium: 4.6 mmol/L (ref 3.5–5.1)
Sodium: 133 mmol/L — ABNORMAL LOW (ref 135–145)
TCO2: 20 mmol/L — ABNORMAL LOW (ref 22–32)

## 2021-05-19 LAB — PROTIME-INR
INR: 1.1 (ref 0.8–1.2)
Prothrombin Time: 14.2 seconds (ref 11.4–15.2)

## 2021-05-19 LAB — BRAIN NATRIURETIC PEPTIDE: B Natriuretic Peptide: 814.9 pg/mL — ABNORMAL HIGH (ref 0.0–100.0)

## 2021-05-19 LAB — RESP PANEL BY RT-PCR (FLU A&B, COVID) ARPGX2
Influenza A by PCR: NEGATIVE
Influenza B by PCR: NEGATIVE
SARS Coronavirus 2 by RT PCR: NEGATIVE

## 2021-05-19 LAB — TROPONIN I (HIGH SENSITIVITY)
Troponin I (High Sensitivity): 17 ng/L (ref <18)
Troponin I (High Sensitivity): 1188 ng/L (ref <18)

## 2021-05-19 LAB — APTT: aPTT: 29 seconds (ref 24–36)

## 2021-05-19 LAB — CBG MONITORING, ED: Glucose-Capillary: 184 mg/dL — ABNORMAL HIGH (ref 70–99)

## 2021-05-19 SURGERY — LEFT HEART CATH AND CORONARY ANGIOGRAPHY
Anesthesia: LOCAL

## 2021-05-19 MED ORDER — ASPIRIN 81 MG PO CHEW
324.0000 mg | CHEWABLE_TABLET | Freq: Once | ORAL | Status: AC
  Administered 2021-05-19: 324 mg via ORAL

## 2021-05-19 MED ORDER — SODIUM CHLORIDE 0.9 % IV SOLN: INTRAVENOUS | Status: DC

## 2021-05-19 MED ORDER — FUROSEMIDE 10 MG/ML IJ SOLN
80.0000 mg | Freq: Once | INTRAMUSCULAR | Status: AC
  Administered 2021-05-19: 80 mg via INTRAVENOUS
  Filled 2021-05-19: qty 8

## 2021-05-19 MED ORDER — HEPARIN (PORCINE) 25000 UT/250ML-% IV SOLN
1250.0000 [IU]/h | INTRAVENOUS | Status: DC
  Administered 2021-05-19: 23:00:00 1000 [IU]/h via INTRAVENOUS
  Filled 2021-05-19 (×2): qty 250

## 2021-05-19 MED ORDER — NITROGLYCERIN IN D5W 200-5 MCG/ML-% IV SOLN: 0.0000 ug/min | INTRAVENOUS | Status: DC

## 2021-05-19 MED ORDER — HEPARIN SODIUM (PORCINE) 5000 UNIT/ML IJ SOLN
4000.0000 [IU] | Freq: Once | INTRAMUSCULAR | Status: AC
  Administered 2021-05-19: 4000 [IU] via INTRAVENOUS

## 2021-05-19 NOTE — Assessment & Plan Note (Signed)
Continue with NIV.  Wean to supplemental oxygen as needed.  Patient does not use supplemental oxygen at home.

## 2021-05-19 NOTE — ED Notes (Signed)
Dr. Imogene Burn ( admitting ) MD notified on elevated Troponin result .

## 2021-05-19 NOTE — ED Notes (Signed)
Admitting MD paged by secretary for RN for update on recent elevated troponin result.

## 2021-05-19 NOTE — Subjective & Objective (Signed)
CC: SOB, chest burning HPI:  66 year old male with a history of type 2 diabetes not taking medications, diabetic peripheral neuropathy presents to the ER today with respiratory distress.  Patient states that he has been having increasing shortness of breath, dyspnea exertion with only walking approximately 35 steps over the past 1 to 2 months.  This is affirmed by his son Antonio Cervantes.  Patient not only walking a few steps without shortness of breath.  Today, the patient was sitting in his chair developed sudden onset of shortness of breath.  This is associate with chest burning.  Never had any pain.  Never had any radiation.  EMS was called.  Initial O2 sats were 84% on room air.  Heart rate was 120.  Patient was noted to be pale and diaphoretic on arrival.  Patient mainly placed on NIV.  Per EDP report, patient almost need to be intubated due to respiratory distress.  Patient denies any recent weight gain.  There is been no lower extremity edema.  Patient has not had a family doctor in several years.  He has a history of diabetes but does not take any medications.  Only medication he takes on a regular basis is indomethacin for reported gout.  Lab evaluation showed a white count of 13  Total cholesterol 204 BNP of 814. BUN of 15, creatinine 1.4 Sodium 130 COVID negative, influenza negative. Chest x-ray positive for pulmonary edema. EKG shows atrial fibrillation.  Initially STEMI doctor was contacted but this was canceled.  [facility] hospitalist contacted for admission.

## 2021-05-19 NOTE — Assessment & Plan Note (Signed)
Chronic. 

## 2021-05-19 NOTE — ED Triage Notes (Addendum)
Pt here via EMS dt SOB and chest tightening beginning at 1600. Pt pale and diaphoretic on arrival.  140/60 120 HR 84% RA

## 2021-05-19 NOTE — ED Provider Notes (Addendum)
[facility] EMERGENCY DEPARTMENT Provider Note   CSN: [id number] Arrival date & time: 05/19/21  1830     History  Chief Complaint  Patient presents with   Shortness of Breath    Antonio Cervantes is a 66 y.o. male.  Patient is a 66 year old male who presents in respiratory distress.  He actually had initially presented as a code STEMI that was activated by EMS.  However he was brought into an ED room as the Cath Lab was not ready.  On my assessment, he was in respiratory distress.  He was complaining of some shortness of breath that had started earlier this afternoon.  He denies any associated chest pain.  No leg edema.  No known history of heart or lung problems.  He is not on diuretics.  He is not on any inhalers.  No recent illnesses.      Home Medications Prior to Admission medications   Medication Sig Start Date End Date Taking? Authorizing Provider  indomethacin (INDOCIN) 25 MG capsule Take 25 mg by mouth 2 (two) times daily as needed (gout flares).   Yes [provider]  acetaminophen (TYLENOL) 325 MG tablet Take 2 tablets (650 mg total) by mouth every 6 (six) hours as needed. Patient not taking: Reported on 05/19/2021 07/14/16   Varney Biles, MD  ibuprofen (ADVIL,MOTRIN) 200 MG tablet Take 2 tablets (400 mg total) by mouth every 6 (six) hours as needed (for pain). Patient not taking: Reported on 05/19/2021 07/14/16   Varney Biles, MD  lidocaine (XYLOCAINE) 5 % ointment Apply 1 application topically as needed. Patient not taking: Reported on 05/19/2021 07/14/16   Varney Biles, MD  metFORMIN (GLUCOPHAGE) 500 MG tablet Take 1 tablet (500 mg total) by mouth 2 (two) times daily with a meal. Patient not taking: Reported on 05/19/2021 07/07/16   Ocie Cornfield T, PA-C  naproxen (NAPROSYN) 500 MG tablet Take 500 mg by mouth 2 (two) times daily with a meal. Patient not taking: Reported on 05/19/2021    [provider]      Allergies     Patient has no known allergies.    Review of Systems   Review of Systems  Constitutional:  Positive for fatigue. Negative for chills, diaphoresis and fever.  HENT:  Negative for congestion, rhinorrhea and sneezing.   Eyes: Negative.   Respiratory:  Positive for cough and shortness of breath. Negative for chest tightness.   Cardiovascular:  Negative for chest pain and leg swelling.  Gastrointestinal:  Negative for abdominal pain, blood in stool, diarrhea, nausea and vomiting.  Genitourinary:  Negative for difficulty urinating, flank pain, frequency and hematuria.  Musculoskeletal:  Negative for arthralgias and back pain.  Skin:  Negative for rash.  Neurological:  Negative for dizziness, speech difficulty, weakness, numbness and headaches.   Physical Exam Updated Vital Signs BP 108/85    Pulse 93    Temp (!) 97.1 F (36.2 C) (Rectal)    Resp 15    Ht 5\' 5"  (1.651 m)    Wt 81.6 kg    SpO2 100%    BMI 29.95 kg/m  Physical Exam Constitutional:      General: He is in acute distress.     Appearance: He is well-developed. He is diaphoretic.  HENT:     Head: Normocephalic and atraumatic.  Eyes:     Pupils: Pupils are equal, round, and reactive to light.  Cardiovascular:     Rate and Rhythm: Normal rate and regular rhythm.  Heart sounds: Normal heart sounds.  Pulmonary:     Effort: Tachypnea and accessory muscle usage present. No respiratory distress.     Breath sounds: Decreased breath sounds and rales present. No wheezing.  Chest:     Chest wall: No tenderness.  Abdominal:     General: Bowel sounds are normal.     Palpations: Abdomen is soft.     Tenderness: There is no abdominal tenderness. There is no guarding or rebound.  Musculoskeletal:        General: Normal range of motion.     Cervical back: Normal range of motion and neck supple.     Right lower leg: No edema.     Left lower leg: No edema.  Lymphadenopathy:     Cervical: No cervical adenopathy.  Skin:    General:  Skin is warm.     Findings: No rash.  Neurological:     Mental Status: He is alert and oriented to person, place, and time.    ED Results / Procedures / Treatments   Labs (all labs ordered are listed, but only abnormal results are displayed) Labs Reviewed  CBC WITH DIFFERENTIAL/PLATELET - Abnormal; Notable for the following components:      Result Value   WBC 13.2 (*)    Lymphs Abs 4.1 (*)    Eosinophils Absolute 0.6 (*)    Basophils Absolute 0.2 (*)    All other components within normal limits  COMPREHENSIVE METABOLIC PANEL - Abnormal; Notable for the following components:   Sodium 130 (*)    CO2 18 (*)    Glucose, Bld 283 (*)    Creatinine, Ser 1.40 (*)    Calcium 8.4 (*)    GFR, Estimated 55 (*)    All other components within normal limits  LIPID PANEL - Abnormal; Notable for the following components:   Cholesterol 204 (*)    Triglycerides 154 (*)    HDL 25 (*)    LDL Cholesterol 148 (*)    All other components within normal limits  BRAIN NATRIURETIC PEPTIDE - Abnormal; Notable for the following components:   B Natriuretic Peptide 814.9 (*)    All other components within normal limits  I-STAT CHEM 8, ED - Abnormal; Notable for the following components:   Sodium 133 (*)    Creatinine, Ser 1.30 (*)    Glucose, Bld 274 (*)    Calcium, Ion 1.05 (*)    TCO2 20 (*)    All other components within normal limits  CBG MONITORING, ED - Abnormal; Notable for the following components:   Glucose-Capillary 184 (*)    All other components within normal limits  TROPONIN I (HIGH SENSITIVITY) - Abnormal; Notable for the following components:   Troponin I (High Sensitivity) 1,188 (*)    All other components within normal limits  RESP PANEL BY RT-PCR (FLU A&B, COVID) ARPGX2  PROTIME-INR  APTT  HEMOGLOBIN A1C  HEPARIN LEVEL (UNFRACTIONATED)  CBC  TROPONIN I (HIGH SENSITIVITY)    EKG EKG Interpretation  Date/Time:  Sunday May 19 2021 21:44:39 EST Ventricular Rate:  94 PR  Interval:  237 QRS Duration: 94 QT Interval:  380 QTC Calculation: 476 R Axis:   17 Text Interpretation: Incomplete analysis due to missing data in precordial lead(s) Sinus or ectopic atrial rhythm Prolonged PR interval Repol abnrm suggests ischemia, anterolateral Missing lead(s): V4 Confirmed by Malvin Johns [phone]) on 05/19/2021 10:57:22 PM  Radiology DG Chest Portable 1 View  Result Date: 05/19/2021 CLINICAL DATA:  Shortness  of breath code STEMI EXAM: PORTABLE CHEST 1 VIEW COMPARISON:  09/16/2017 FINDINGS: Diffuse interstitial opacity, suspicious for edema. No pleural effusion. Borderline cardiomegaly. No pneumothorax IMPRESSION: Moderate diffuse interstitial opacity is suspicious for pulmonary edema. Electronically Signed   By: Donavan Foil M.D.   On: 05/19/2021 18:52    Procedures Procedures    Medications Ordered in ED Medications  0.9 %  sodium chloride infusion ( Intravenous New Bag/Given 05/19/21 1917)  nitroGLYCERIN 50 mg in dextrose 5 % 250 mL (0.2 mg/mL) infusion (0 mcg/min Intravenous Not Given 05/19/21 1909)  heparin ADULT infusion 100 units/mL (25000 units/244mL) (1,000 Units/hr Intravenous New Bag/Given 05/19/21 2235)  aspirin chewable tablet 324 mg (324 mg Oral Given 05/19/21 1837)  heparin injection 4,000 Units (4,000 Units Intravenous Given 05/19/21 1840)  furosemide (LASIX) injection 80 mg (80 mg Intravenous Given 05/19/21 1847)    ED Course/ Medical Decision Making/ A&P                           Medical Decision Making Amount and/or Complexity of Data Reviewed Independent Historian: EMS External Data Reviewed: labs and notes. Labs: ordered. Decision-making details documented in ED Course. Radiology: ordered and independent interpretation performed. Decision-making details documented in ED Course. ECG/medicine tests: ordered and independent interpretation performed. Decision-making details documented in ED Course.  Risk OTC drugs. Prescription drug  management. Decision regarding hospitalization.  Critical Care Total time providing critical care: 30-74 minutes  Patient is a 66 year old male who presents in respiratory distress.  Initially he was activated as a code STEMI.  However on arrival to the ED, it appeared to be that he was in severe respiratory distress and was hypoxic, requiring nonrebreather.  EKG done at bedside does not show any ischemic changes.  Appears the patient is in atrial fibrillation.  Dr. Burt Knack at bedside and decision was made to cancel code STEMI.  Patient was immediately placed on BiPAP.  He was given IV Lasix.  He had marked improvement after this.  He was maintained on BiPAP for a couple of hours and was feeling much better.  Chest x-ray shows evidence of pulmonary edema.  This was interpreted by me.  His troponins are negative.  No ST elevation noted on EKG.  He does have some mild STdepressions.  He appears to be in atrial fibrillation. This was interpreted by me.  His COVID test is negative.  His BNP is elevated.  Patient likely has new onset congestive heart failure and possibly new onset atrial fibrillation.  I reviewed his records and do not see any known heart history.  He says he currently does not have a primary care provider.  I spoke with Dr. Bridgett Larsson he will admit the patient for further treatment.  Final Clinical Impression(s) / ED Diagnoses Final diagnoses:  Acute congestive heart failure, unspecified heart failure type ([place])  Acute respiratory failure with hypoxia (HCC)  Atrial fibrillation, unspecified type [facility])    Rx / DC Orders ED Discharge Orders     None         Malvin Johns, MD 05/19/21 2113    Malvin Johns, MD 05/19/21 2259    Malvin Johns, MD 05/19/21 2300

## 2021-05-19 NOTE — H&P (Signed)
History and Physical    Antonio Cervantes [id number] DOB: April 25, 1956 DOA: 05/19/2021  PCP: Patient, No Pcp Per (Inactive)   Patient coming from: Home  I have personally briefly reviewed patient's old medical records in [place]  CC: SOB, chest burning HPI:  66 year old male with a history of type 2 diabetes not taking medications, diabetic peripheral neuropathy presents to the ER today with respiratory distress.  Patient states that he has been having increasing shortness of breath, dyspnea exertion with only walking approximately 35 steps over the past 1 to 2 months.  This is affirmed by his son Corene Cornea.  Patient not only walking a few steps without shortness of breath.  Today, the patient was sitting in his chair developed sudden onset of shortness of breath.  This is associate with chest burning.  Never had any pain.  Never had any radiation.  EMS was called.  Initial O2 sats were 84% on room air.  Heart rate was 120.  Patient was noted to be pale and diaphoretic on arrival.  Patient mainly placed on NIV.  Per EDP report, patient almost need to be intubated due to respiratory distress.  Patient denies any recent weight gain.  There is been no lower extremity edema.  Patient has not had a family doctor in several years.  He has a history of diabetes but does not take any medications.  Only medication he takes on a regular basis is indomethacin for reported gout.  Lab evaluation showed a white count of 13  Total cholesterol 204 BNP of 814. BUN of 15, creatinine 1.4 Sodium 130 COVID negative, influenza negative. Chest x-ray positive for pulmonary edema. EKG shows atrial fibrillation.  Initially STEMI doctor was contacted but this was canceled.  [facility] hospitalist contacted for admission.   ED Course: started on NIV. CXR shows pulmonary edema.  Review of Systems:  Review of Systems  Constitutional:  Positive for malaise/fatigue.  HENT: Negative.    Eyes: Negative.    Respiratory:  Positive for shortness of breath.   Cardiovascular:        Increasing exertional shortness of breath.  Was able to walk 35 steps about a month ago.  Now becomes extremely short of breath with even walking 5 steps.  ??anginal equivalent??  +chest burning sensation. No pain or pressure. No radiation  Gastrointestinal: Negative.   Genitourinary: Negative.   Musculoskeletal: Negative.   Skin: Negative.   Neurological: Negative.   Endo/Heme/Allergies: Negative.   Psychiatric/Behavioral: Negative.    All other systems reviewed and are negative.  Past Medical History:  Diagnosis Date   Diabetes mellitus without complication ([place])    Diabetic peripheral neuropathy associated with type 2 diabetes mellitus ([place])     No past surgical history on file.   reports that he quit smoking about 40 years ago. His smoking use included cigarettes. He has never used smokeless tobacco. He reports that he does not drink alcohol and does not use drugs.  No Known Allergies  No family history on file.  Prior to Admission medications   Medication Sig Start Date End Date Taking? Authorizing Provider  acetaminophen (TYLENOL) 325 MG tablet Take 2 tablets (650 mg total) by mouth every 6 (six) hours as needed. 07/14/16   Varney Biles, MD  ibuprofen (ADVIL,MOTRIN) 200 MG tablet Take 2 tablets (400 mg total) by mouth every 6 (six) hours as needed (for pain). 07/14/16   Varney Biles, MD  lidocaine (XYLOCAINE) 5 % ointment Apply 1 application topically as  needed. 07/14/16   Varney Biles, MD  metFORMIN (GLUCOPHAGE) 500 MG tablet Take 1 tablet (500 mg total) by mouth 2 (two) times daily with a meal. 07/07/16   Leaphart, Zack Seal, PA-C  naproxen (NAPROSYN) 500 MG tablet Take 500 mg by mouth 2 (two) times daily with a meal.    [provider]    Physical Exam: Vitals:   05/19/21 1850 05/19/21 1900 05/19/21 2000 05/19/21 2030  BP:  105/77 101/73 117/87  Pulse:  (!) 102 95 94  Resp:  (!)  22 20 16   Temp:  (!) 97.1 F (36.2 C)    TempSrc:  Rectal    SpO2:  98% 100% 100%  Weight: 81.6 kg     Height: 5\' 5"  (1.651 m)       Physical Exam Vitals and nursing note reviewed.  Constitutional:      General: He is not in acute distress.    Appearance: Normal appearance. He is obese. He is not ill-appearing, toxic-appearing or diaphoretic.  HENT:     Head: Normocephalic and atraumatic.     Nose: Nose normal. No rhinorrhea.  Eyes:     General:        Right eye: No discharge.        Left eye: No discharge.  Cardiovascular:     Rate and Rhythm: Regular rhythm. Tachycardia present.     Pulses: Normal pulses.  Pulmonary:     Effort: Pulmonary effort is normal.     Breath sounds: Examination of the right-upper field reveals decreased breath sounds. Examination of the left-upper field reveals decreased breath sounds. Examination of the right-middle field reveals decreased breath sounds. Examination of the left-middle field reveals decreased breath sounds. Examination of the right-lower field reveals decreased breath sounds. Examination of the left-lower field reveals decreased breath sounds. Decreased breath sounds present. No wheezing or rhonchi.  Abdominal:     General: Bowel sounds are normal. There is no distension.     Tenderness: There is no abdominal tenderness. There is no guarding or rebound.  Musculoskeletal:     Right lower leg: No edema.     Left lower leg: No edema.  Skin:    General: Skin is warm and dry.     Capillary Refill: Capillary refill takes less than 2 seconds.  Neurological:     General: No focal deficit present.     Mental Status: He is alert and oriented to person, place, and time.     Labs on Admission: I have personally reviewed following labs and imaging studies  CBC: Recent Labs  Lab 05/19/21 1832 05/19/21 1847  WBC 13.2*  --   NEUTROABS 7.4  --   HGB 13.4 14.6  HCT 42.3 43.0  MCV 95.5  --   PLT 285  --    Basic Metabolic Panel: Recent  Labs  Lab 05/19/21 1832 05/19/21 1847  NA 130* 133*  K 4.4 4.6  CL 103 103  CO2 18*  --   GLUCOSE 283* 274*  BUN 15 16  CREATININE 1.40* 1.30*  CALCIUM 8.4*  --    GFR: Estimated Creatinine Clearance: 54.9 mL/min (A) (by C-G formula based on SCr of 1.3 mg/dL (H)). Liver Function Tests: Recent Labs  Lab 05/19/21 1832  AST 16  ALT 11  ALKPHOS 50  BILITOT 0.5  PROT 6.9  ALBUMIN 3.5   No results for input(s): LIPASE, AMYLASE in the last 168 hours. No results for input(s): AMMONIA in the last 168 hours.  Coagulation Profile: Recent Labs  Lab 05/19/21 1900  INR 1.1   Cardiac Enzymes: No results for input(s): CKTOTAL, CKMB, CKMBINDEX, TROPONINI in the last 168 hours. BNP (last 3 results) No results for input(s): PROBNP in the last 8760 hours. HbA1C: No results for input(s): HGBA1C in the last 72 hours. CBG: Recent Labs  Lab 05/19/21 1842  GLUCAP 184*   Lipid Profile: Recent Labs    05/19/21 1832  CHOL 204*  HDL 25*  LDLCALC 148*  TRIG 154*  CHOLHDL 8.2   Thyroid Function Tests: No results for input(s): TSH, T4TOTAL, FREET4, T3FREE, THYROIDAB in the last 72 hours. Anemia Panel: No results for input(s): VITAMINB12, FOLATE, FERRITIN, TIBC, IRON, RETICCTPCT in the last 72 hours. Urine analysis:    Component Value Date/Time   COLORURINE YELLOW 07/14/2016 [place] 07/14/2016 0551   LABSPEC 1.010 07/14/2016 0551   PHURINE 5.5 07/14/2016 0551   GLUCOSEU >=500 (A) 07/14/2016 0551   HGBUR NEGATIVE 07/14/2016 0551   BILIRUBINUR NEGATIVE 07/14/2016 0551   KETONESUR 15 (A) 07/14/2016 0551   PROTEINUR NEGATIVE 07/14/2016 0551   NITRITE NEGATIVE 07/14/2016 0551   LEUKOCYTESUR NEGATIVE 07/14/2016 0551    Radiological Exams on Admission: I have personally reviewed images DG Chest Portable 1 View  Result Date: 05/19/2021 CLINICAL DATA:  Shortness of breath code STEMI EXAM: PORTABLE CHEST 1 VIEW COMPARISON:  09/16/2017 FINDINGS: Diffuse  interstitial opacity, suspicious for edema. No pleural effusion. Borderline cardiomegaly. No pneumothorax IMPRESSION: Moderate diffuse interstitial opacity is suspicious for pulmonary edema. Electronically Signed   By: Donavan Foil M.D.   On: 05/19/2021 18:52    EKG: I have personally reviewed EKG: afib    Assessment/Plan Principal Problem:   Flash pulmonary edema (HCC) Active Problems:   Acute respiratory failure with hypoxia (HCC)   Unspecified atrial fibrillation (HCC)   Diabetic peripheral neuropathy associated with type 2 diabetes mellitus (HCC)   AKI (acute kidney injury) ([place])   DM (diabetes mellitus), type 2 ([place])    Flash pulmonary edema (HCC) Admit to progressive bed.  Continue with IV Lasix.  Monitor serum creatinine.  Likely heart failure.  Check echo.  Acute respiratory failure with hypoxia (HCC) Continue with NIV.  Wean to supplemental oxygen as needed.  Patient does not use supplemental oxygen at home.  Unspecified atrial fibrillation (HCC) Check echo.  Would not be surprised if he had decreased LV function.  Start Lovenox for now.  May need to switch to a NOAC later.  Diabetic peripheral neuropathy associated with type 2 diabetes mellitus (HCC) Chronic.  AKI (acute kidney injury) ([place]) Unclear of his elevated serum creatinine is due to nonsteroidal anti-inflammatories or chronic kidney disease.  He has no recent labs.  We will need to assume his elevated creatinine is from acute kidney injury until serial monitoring of his creatinine is performed.  DM (diabetes mellitus), type 2 (HCC) Check A1c.  Patient needs a PCP at discharge.  Start sliding scale insulin for now.  DVT prophylaxis: Lovenox Code Status: Full Code Family Communication: discussed with pt and son jason at bedside  Disposition Plan: return home  Consults called: cardiology consulted by EDP  Admission status: Inpatient,  progressive   Kristopher Oppenheim, DO [facility] 05/19/2021, 9:32 PM

## 2021-05-19 NOTE — Assessment & Plan Note (Signed)
Unclear of his elevated serum creatinine is due to nonsteroidal anti-inflammatories or chronic kidney disease.  He has no recent labs.  We will need to assume his elevated creatinine is from acute kidney injury until serial monitoring of his creatinine is performed.

## 2021-05-19 NOTE — Progress Notes (Signed)
ANTICOAGULATION CONSULT NOTE - Initial Consult  Pharmacy Consult for heparin Indication: chest pain/ACS  No Known Allergies  Patient Measurements: Height: 5\' 5"  (165.1 cm) Weight: 81.6 kg (180 lb) IBW/kg (Calculated) : 61.5 Heparin Dosing Weight: 78 kg   Vital Signs: Temp: 97.1 F (36.2 C) (01/22 1900) Temp Source: Rectal (01/22 1900) BP: 108/85 (01/22 2045) Pulse Rate: 93 (01/22 2045)  Labs: Recent Labs    05/19/21 1832 05/19/21 1847 05/19/21 1900 05/19/21 2032  HGB 13.4 14.6  --   --   HCT 42.3 43.0  --   --   PLT 285  --   --   --   APTT  --   --  29  --   LABPROT  --   --  14.2  --   INR  --   --  1.1  --   CREATININE 1.40* 1.30*  --   --   TROPONINIHS 17  --   --  1,188*    Estimated Creatinine Clearance: 54.9 mL/min (A) (by C-G formula based on SCr of 1.3 mg/dL (H)).   Medical History: Past Medical History:  Diagnosis Date   Diabetes mellitus without complication ([place])    Diabetic peripheral neuropathy associated with type 2 diabetes mellitus ([place])     Medications:  (Not in a hospital admission)   Assessment: 8 YOM who presents with respiratory distress and chest burning. Initial troponin is elevated. Pharmacy consulted to start IV heparin.   H/H and Plt wnl. SCr elevated   Goal of Therapy:  Heparin level 0.3-0.7 units/ml Monitor platelets by anticoagulation protocol: Yes   Plan:  -Heparin 4000 units IV bolus followed by heparin infusion at 1000 units/hr -F/u 6 hr HL -Monitor daily HL, CBC and s/s of bleeding  Albertina Parr, PharmD., BCPS, BCCCP Clinical Pharmacist Please refer to [facility] for unit-specific pharmacist

## 2021-05-19 NOTE — Assessment & Plan Note (Signed)
Check A1c.  Patient needs a PCP at discharge.  Start sliding scale insulin for now.

## 2021-05-19 NOTE — Assessment & Plan Note (Signed)
Admit to progressive bed.  Continue with IV Lasix.  Monitor serum creatinine.  Likely heart failure.  Check echo.

## 2021-05-19 NOTE — Progress Notes (Signed)
Pt taken off bipap and placed on [place] per RN.

## 2021-05-19 NOTE — Assessment & Plan Note (Signed)
Check echo.  Would not be surprised if he had decreased LV function.  Start Lovenox for now.  May need to switch to a NOAC later.

## 2021-05-20 ENCOUNTER — Other Ambulatory Visit (HOSPITAL_COMMUNITY): Payer: Self-pay

## 2021-05-20 ENCOUNTER — Inpatient Hospital Stay (HOSPITAL_COMMUNITY): Payer: Medicare Other

## 2021-05-20 DIAGNOSIS — J9601 Acute respiratory failure with hypoxia: Secondary | ICD-10-CM

## 2021-05-20 DIAGNOSIS — I214 Non-ST elevation (NSTEMI) myocardial infarction: Secondary | ICD-10-CM

## 2021-05-20 DIAGNOSIS — I4892 Unspecified atrial flutter: Secondary | ICD-10-CM

## 2021-05-20 DIAGNOSIS — J810 Acute pulmonary edema: Secondary | ICD-10-CM

## 2021-05-20 DIAGNOSIS — I5043 Acute on chronic combined systolic (congestive) and diastolic (congestive) heart failure: Secondary | ICD-10-CM

## 2021-05-20 DIAGNOSIS — I4891 Unspecified atrial fibrillation: Secondary | ICD-10-CM

## 2021-05-20 DIAGNOSIS — I5040 Unspecified combined systolic (congestive) and diastolic (congestive) heart failure: Secondary | ICD-10-CM

## 2021-05-20 LAB — CBC WITH DIFFERENTIAL/PLATELET
Abs Immature Granulocytes: 0.04 10*3/uL (ref 0.00–0.07)
Basophils Absolute: 0.1 10*3/uL (ref 0.0–0.1)
Basophils Relative: 1 %
Eosinophils Absolute: 0.2 10*3/uL (ref 0.0–0.5)
Eosinophils Relative: 2 %
HCT: 34.1 % — ABNORMAL LOW (ref 39.0–52.0)
Hemoglobin: 11.3 g/dL — ABNORMAL LOW (ref 13.0–17.0)
Immature Granulocytes: 0 %
Lymphocytes Relative: 21 %
Lymphs Abs: 2 10*3/uL (ref 0.7–4.0)
MCH: 30.6 pg (ref 26.0–34.0)
MCHC: 33.1 g/dL (ref 30.0–36.0)
MCV: 92.4 fL (ref 80.0–100.0)
Monocytes Absolute: 0.7 10*3/uL (ref 0.1–1.0)
Monocytes Relative: 8 %
Neutro Abs: 6.5 10*3/uL (ref 1.7–7.7)
Neutrophils Relative %: 68 %
Platelets: 242 10*3/uL (ref 150–400)
RBC: 3.69 MIL/uL — ABNORMAL LOW (ref 4.22–5.81)
RDW: 13.6 % (ref 11.5–15.5)
WBC: 9.5 10*3/uL (ref 4.0–10.5)
nRBC: 0 % (ref 0.0–0.2)

## 2021-05-20 LAB — ECHOCARDIOGRAM COMPLETE
AR max vel: 2.06 cm2
AV Area VTI: 1.74 cm2
AV Area mean vel: 1.99 cm2
AV Mean grad: 10 mmHg
AV Peak grad: 17.5 mmHg
Ao pk vel: 2.09 m/s
Area-P 1/2: 4.52 cm2
Calc EF: 28.7 %
Height: 65 in
P 1/2 time: 358 msec
S' Lateral: 4.3 cm
Single Plane A2C EF: 30.9 %
Single Plane A4C EF: 22 %
Weight: 2880 oz

## 2021-05-20 LAB — CBC
HCT: 35.9 % — ABNORMAL LOW (ref 39.0–52.0)
Hemoglobin: 12 g/dL — ABNORMAL LOW (ref 13.0–17.0)
MCH: 30.4 pg (ref 26.0–34.0)
MCHC: 33.4 g/dL (ref 30.0–36.0)
MCV: 90.9 fL (ref 80.0–100.0)
Platelets: 257 10*3/uL (ref 150–400)
RBC: 3.95 MIL/uL — ABNORMAL LOW (ref 4.22–5.81)
RDW: 13.5 % (ref 11.5–15.5)
WBC: 9.9 10*3/uL (ref 4.0–10.5)
nRBC: 0 % (ref 0.0–0.2)

## 2021-05-20 LAB — COMPREHENSIVE METABOLIC PANEL
ALT: 14 U/L (ref 0–44)
AST: 52 U/L — ABNORMAL HIGH (ref 15–41)
Albumin: 3.2 g/dL — ABNORMAL LOW (ref 3.5–5.0)
Alkaline Phosphatase: 44 U/L (ref 38–126)
Anion gap: 7 (ref 5–15)
BUN: 18 mg/dL (ref 8–23)
CO2: 22 mmol/L (ref 22–32)
Calcium: 8.3 mg/dL — ABNORMAL LOW (ref 8.9–10.3)
Chloride: 105 mmol/L (ref 98–111)
Creatinine, Ser: 1.48 mg/dL — ABNORMAL HIGH (ref 0.61–1.24)
GFR, Estimated: 52 mL/min — ABNORMAL LOW (ref >60)
Glucose, Bld: 118 mg/dL — ABNORMAL HIGH (ref 70–99)
Potassium: 4.5 mmol/L (ref 3.5–5.1)
Sodium: 134 mmol/L — ABNORMAL LOW (ref 135–145)
Total Bilirubin: 0.7 mg/dL (ref 0.3–1.2)
Total Protein: 6.2 g/dL — ABNORMAL LOW (ref 6.5–8.1)

## 2021-05-20 LAB — MAGNESIUM: Magnesium: 1.7 mg/dL (ref 1.7–2.4)

## 2021-05-20 LAB — GLUCOSE, CAPILLARY: Glucose-Capillary: 98 mg/dL (ref 70–99)

## 2021-05-20 LAB — HEPARIN LEVEL (UNFRACTIONATED)
Heparin Unfractionated: 0.14 IU/mL — ABNORMAL LOW (ref 0.30–0.70)
Heparin Unfractionated: 0.33 IU/mL (ref 0.30–0.70)
Heparin Unfractionated: 0.34 IU/mL (ref 0.30–0.70)

## 2021-05-20 LAB — CBG MONITORING, ED
Glucose-Capillary: 117 mg/dL — ABNORMAL HIGH (ref 70–99)
Glucose-Capillary: 127 mg/dL — ABNORMAL HIGH (ref 70–99)

## 2021-05-20 LAB — HIV ANTIBODY (ROUTINE TESTING W REFLEX): HIV Screen 4th Generation wRfx: NONREACTIVE

## 2021-05-20 LAB — T4, FREE: Free T4: 0.46 ng/dL — ABNORMAL LOW (ref 0.61–1.12)

## 2021-05-20 LAB — TSH: TSH: 109.243 u[IU]/mL — ABNORMAL HIGH (ref 0.350–4.500)

## 2021-05-20 MED ORDER — ALBUTEROL SULFATE (2.5 MG/3ML) 0.083% IN NEBU: 2.5000 mg | INHALATION_SOLUTION | RESPIRATORY_TRACT | Status: DC | PRN

## 2021-05-20 MED ORDER — CARVEDILOL 3.125 MG PO TABS: 3.1250 mg | ORAL_TABLET | Freq: Two times a day (BID) | ORAL | Status: DC

## 2021-05-20 MED ORDER — ACETAMINOPHEN 650 MG RE SUPP: 650.0000 mg | Freq: Four times a day (QID) | RECTAL | Status: DC | PRN

## 2021-05-20 MED ORDER — FUROSEMIDE 10 MG/ML IJ SOLN
40.0000 mg | Freq: Two times a day (BID) | INTRAMUSCULAR | Status: DC
  Administered 2021-05-20 (×2): 40 mg via INTRAVENOUS
  Filled 2021-05-20 (×2): qty 4

## 2021-05-20 MED ORDER — MAGNESIUM SULFATE 2 GM/50ML IV SOLN
2.0000 g | Freq: Once | INTRAVENOUS | Status: AC
  Administered 2021-05-20: 2 g via INTRAVENOUS
  Filled 2021-05-20: qty 50

## 2021-05-20 MED ORDER — SODIUM CHLORIDE 0.9% FLUSH
3.0000 mL | Freq: Two times a day (BID) | INTRAVENOUS | Status: DC
  Administered 2021-05-20 – 2021-05-23 (×5): 3 mL via INTRAVENOUS

## 2021-05-20 MED ORDER — INSULIN ASPART 100 UNIT/ML IJ SOLN
0.0000 [IU] | Freq: Three times a day (TID) | INTRAMUSCULAR | Status: DC
  Administered 2021-05-20 – 2021-05-23 (×2): 2 [IU] via SUBCUTANEOUS
  Administered 2021-05-23: 3 [IU] via SUBCUTANEOUS

## 2021-05-20 MED ORDER — ACETAMINOPHEN 325 MG PO TABS: 650.0000 mg | ORAL_TABLET | Freq: Four times a day (QID) | ORAL | Status: DC | PRN

## 2021-05-20 MED ORDER — ONDANSETRON HCL 4 MG/2ML IJ SOLN: 4.0000 mg | Freq: Four times a day (QID) | INTRAMUSCULAR | Status: DC | PRN

## 2021-05-20 MED ORDER — HEPARIN BOLUS VIA INFUSION
2000.0000 [IU] | Freq: Once | INTRAVENOUS | Status: AC
Start: 2021-05-20 — End: 2021-05-20
  Administered 2021-05-20: 2000 [IU] via INTRAVENOUS
  Filled 2021-05-20: qty 2000

## 2021-05-20 MED ORDER — NITROGLYCERIN IN D5W 200-5 MCG/ML-% IV SOLN
0.0000 ug/min | INTRAVENOUS | Status: DC
  Administered 2021-05-20: 10 ug/min via INTRAVENOUS
  Filled 2021-05-20: qty 250

## 2021-05-20 MED ORDER — ONDANSETRON HCL 4 MG PO TABS: 4.0000 mg | ORAL_TABLET | Freq: Four times a day (QID) | ORAL | Status: DC | PRN

## 2021-05-20 MED ORDER — INSULIN ASPART 100 UNIT/ML IJ SOLN: 0.0000 [IU] | Freq: Every day | INTRAMUSCULAR | Status: DC

## 2021-05-20 MED ORDER — ATORVASTATIN CALCIUM 80 MG PO TABS
80.0000 mg | ORAL_TABLET | Freq: Every day | ORAL | Status: DC
  Administered 2021-05-20 – 2021-05-23 (×4): 80 mg via ORAL
  Filled 2021-05-20: qty 2
  Filled 2021-05-20 (×3): qty 1

## 2021-05-20 MED ORDER — ASPIRIN EC 81 MG PO TBEC
81.0000 mg | DELAYED_RELEASE_TABLET | Freq: Every day | ORAL | Status: DC
  Administered 2021-05-20 – 2021-05-21 (×2): 81 mg via ORAL
  Filled 2021-05-20 (×2): qty 1

## 2021-05-20 NOTE — Progress Notes (Signed)
Discussed elevated troponin with cardiology fellow. He will see patient in consult. Placed on IV heparin gtts. Will likely need ischemic eval with LHC.

## 2021-05-20 NOTE — ED Notes (Signed)
Echo in process at bedside.

## 2021-05-20 NOTE — ED Notes (Signed)
[name] daughter in law [phone] requesting an update on the patient

## 2021-05-20 NOTE — Progress Notes (Signed)
ANTICOAGULATION CONSULT NOTE  Pharmacy Consult for heparin Indication: chest pain/ACS  No Known Allergies  Patient Measurements: Height: 5\' 5"  (165.1 cm) Weight: 81.6 kg (180 lb) IBW/kg (Calculated) : 61.5 Heparin Dosing Weight: 78 kg   Vital Signs: BP: 110/81 (01/23 0645) Pulse Rate: 80 (01/23 0645)  Labs: Recent Labs    05/19/21 1832 05/19/21 1847 05/19/21 1900 05/19/21 2032 05/20/21 0340 05/20/21 0655  HGB 13.4 14.6  --   --  12.0*  --   HCT 42.3 43.0  --   --  35.9*  --   PLT 285  --   --   --  257  --   APTT  --   --  29  --   --   --   LABPROT  --   --  14.2  --   --   --   INR  --   --  1.1  --   --   --   HEPARINUNFRC  --   --   --   --   --  0.14*  CREATININE 1.40* 1.30*  --   --  1.48*  --   TROPONINIHS 17  --   --  1,188*  --   --      Estimated Creatinine Clearance: 48.3 mL/min (A) (by C-G formula based on SCr of 1.48 mg/dL (H)).   Medical History: Past Medical History:  Diagnosis Date   Diabetes mellitus without complication (HCC)    Diabetic peripheral neuropathy associated with type 2 diabetes mellitus (HCC)     Medications:  (Not in a hospital admission)  Assessment: 67 YOM who presents with respiratory distress and chest burning. Pharmacy consulted to start IV heparin.   Heparin level came back subtherapeutic at 0.14, on 1000 units/hr. Hgb down slightly to 12, plt 257. No s/sx of bleeding or infusion issues.   Goal of Therapy:  Heparin level 0.3-0.7 units/ml Monitor platelets by anticoagulation protocol: Yes   Plan:  -Order heparin bolus 2000 units -Increase heparin infusion to 1200 units/hr -F/u 6 hr HL -Monitor daily HL, CBC and s/s of bleeding  70, PharmD, BCCCP Clinical Pharmacist  Phone: [phone] 05/20/2021 7:37 AM  Please check AMION for all [facility] Pharmacy phone numbers After 10:00 PM, call Main Pharmacy [phone]

## 2021-05-20 NOTE — ED Notes (Signed)
Cardiologist at bedside evaluating patient , explained plan of care to patient .

## 2021-05-20 NOTE — ED Notes (Signed)
Breakfast order placed ?

## 2021-05-20 NOTE — Progress Notes (Addendum)
The patient has been seen in conjunction with Vin Bhagat, PAC. All aspects of care have been considered and discussed. The patient has been personally interviewed, examined, and all clinical data has been reviewed.  1 month history of chest tightness and shortness of breath that suddenly worsened last evening prior to eating supper.  Having ongoing mild chest pressure. Medical problems include diabetes mellitus, prior smoker, and hyperlipidemia.  Scant prior records within [facility] since 2018. IMPRESSIONS: Progressive heart failure over past 1 to 2 months with decompensation leading to admission.  2D Doppler echocardiogram to assess LV systolic function.  IV diuresis.  Serial markers and coronary angiography once more stable.  Troponin suggest non-ST elevation MI.  Initial EKG raise question of atrial fibrillation.  Repeat EKG 3 to 4 hours later demonstrated sinus rhythm with first-degree AV block and ST-T wave abnormality.  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  2D Doppler echocardiogram.  Hydration as tolerated prior to cath.   Critical care time 35 minutes  Progress Note  Patient Name: Antonio Cervantes Date of Encounter: 05/20/2021  [facility] Cardiologist: None New to Dr. Tamala Julian  Subjective   Breathing stable. No chest pain.   Inpatient Medications    Scheduled Meds:  aspirin EC  81 mg Oral Daily   atorvastatin  80 mg Oral Daily   furosemide  40 mg  Intravenous Q12H   insulin aspart  0-15 Units Subcutaneous TID WC   insulin aspart  0-5 Units Subcutaneous QHS   Continuous Infusions:  heparin 1,200 Units/hr (05/20/21 0747)   magnesium sulfate bolus IVPB     PRN Meds: acetaminophen **OR** acetaminophen, albuterol, ondansetron **OR** ondansetron (ZOFRAN) IV   Vital Signs    Vitals:   05/20/21 0615 05/20/21 0630 05/20/21 0645 05/20/21 0730  BP: 105/77 102/71 110/81 127/86  Pulse: 75 73 80 (!) 108  Resp: 17 17 16 16   Temp:      TempSrc:      SpO2: 100% 100% 100% 100%  Weight:      Height:        Intake/Output Summary (Last 24 hours) at 05/20/2021 0800 Last data filed at 05/20/2021 0747 Gross per 24 hour  Intake 91.83 ml  Output 650 ml  Net -558.17 ml   Last 3 Weights 05/19/2021 07/14/2016 07/07/2016  Weight (lbs) 180 lb 199 lb 199 lb  Weight (kg) 81.647 kg 90.266 kg 90.266 kg      Telemetry    Sinus rhythm - Personally Reviewed  ECG    Sr with lateral ST depression.  - Personally Reviewed  Physical Exam   GEN: No acute distress.   Neck: No JVD Cardiac: RRR, no murmurs, rubs, or gallops.  Respiratory: Clear to auscultation bilaterally. GI: Soft, nontender, non-distended  MS: No edema; No deformity. Neuro:  Nonfocal  Psych: Normal affect   Labs    High Sensitivity Troponin:   Recent Labs  Lab 05/19/21 1832 05/19/21 2032  TROPONINIHS 17 1,188*     Chemistry Recent Labs  Lab  05/19/21 1832 05/19/21 1847 05/20/21 0340  NA 130* 133* 134*  K 4.4 4.6 4.5  CL 103 103 105  CO2 18*  --  22  GLUCOSE 283* 274* 118*  BUN 15 16 18   CREATININE 1.40* 1.30* 1.48*  CALCIUM 8.4*  --  8.3*  MG  --   --  1.7  PROT 6.9  --  6.2*  ALBUMIN 3.5  --  3.2*  AST 16  --  52*  ALT 11  --  14  ALKPHOS 50  --  44  BILITOT 0.5  --  0.7  GFRNONAA 55*  --  52*  ANIONGAP 9  --  7    Lipids  Recent Labs  Lab 05/19/21 1832  CHOL 204*  TRIG 154*  HDL 25*  LDLCALC 148*  CHOLHDL 8.2    Hematology Recent Labs  Lab  05/19/21 1832 05/19/21 1847 05/20/21 0340  WBC 13.2*  --  9.9  RBC 4.43  --  3.95*  HGB 13.4 14.6 12.0*  HCT 42.3 43.0 35.9*  MCV 95.5  --  90.9  MCH 30.2  --  30.4  MCHC 31.7  --  33.4  RDW 13.6  --  13.5  PLT 285  --  257   Thyroid No results for input(s): TSH, FREET4 in the last 168 hours.  BNP Recent Labs  Lab 05/19/21 1847  BNP 814.9*    DDimer No results for input(s): DDIMER in the last 168 hours.   Radiology    DG Chest Portable 1 View  Result Date: 05/19/2021 CLINICAL DATA:  Shortness of breath code STEMI EXAM: PORTABLE CHEST 1 VIEW COMPARISON:  09/16/2017 FINDINGS: Diffuse interstitial opacity, suspicious for edema. No pleural effusion. Borderline cardiomegaly. No pneumothorax IMPRESSION: Moderate diffuse interstitial opacity is suspicious for pulmonary edema. Electronically Signed   By: Donavan Foil M.D.   On: 05/19/2021 18:52    Cardiac Studies   Pending echo and cath   Patient Profile     66 y.o. male with a hx of DM2 and distant prior tobacco use (~15 py, 40 years ago) presented for acute onset SOB/DOE and palpitations and found to have elevated troponin and Afib RVR.   Assessment & Plan    1.NSTEMI - Cancelled code STEMI.  ECG with AF/RVR with subsequent ECG with NSR and lateral ST depressions. Breathing stable. Continue heparin. Keep NPO. Cath today or tomorrow. Continue ASA, Statin or BB.  2. New onset afib RVR - He felt palpitations with SOB. No prior similar episode. Spontaneously converted. Continue BB. Pending TSH and echo. Continue heparin>> change to NOAC prior to discharge.   3. AKI vs CKD  - Unknown baseline. Scr 1.3-1.4 here. Suspects CKD III due to DM  4. DM - Per primary team   5. Volume overload - BNP 814. CXR with ? Pulmonary edema. Given IV lasix x 1. Likely due to elevated HR. Breathing stable now. Pending echo. 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   For questions or updates, please contact [place] Please consult [email or web address] for contact info under        Signed, Leanor Kail, PA  05/20/2021, 8:00 AM

## 2021-05-20 NOTE — ED Notes (Signed)
Espiridion Supinski brother [phone] requesting an update on the patient

## 2021-05-20 NOTE — Progress Notes (Signed)
°  Echocardiogram 2D Echocardiogram has been performed.  Antonio Cervantes 05/20/2021, 4:13 PM

## 2021-05-20 NOTE — ED Notes (Signed)
Attempted to call report

## 2021-05-20 NOTE — Progress Notes (Signed)
PROGRESS NOTE  Antonio Cervantes  WVP:[id number] DOB: [birth year] DOA: 05/19/2021 PCP: Oneita Hurt, No  Brief Narrative: Antonio Cervantes is a 66 y.o. male with a history of T2DM no longer on medications, and gout who presented to the ED 1/22 with 1-2 months of progressive exertional dyspnea and abrupt onset of chest burning and dyspnea after dinner. EMS was called, found the patient to be hypoxic to 86%, tachycardic, pale and diaphoretic. NIPPV was placed and code STEMI called, though later cancelled based on ECG on arrival which showed no ST elevation, but suggested atrial fibrillation. CXR revealed pulmonary edema and the patient improved significantly with IV lasix. Initial troponin was 17, increased to 1,188 at time of admission. Cardiology consulted, IV heparin initiated.    Assessment & Plan: Principal Problem:   Flash pulmonary edema (HCC) Active Problems:   Acute respiratory failure with hypoxia (HCC)   Diabetic peripheral neuropathy associated with type 2 diabetes mellitus (HCC)   AKI (acute kidney injury) (HCC)   Unspecified atrial fibrillation (HCC)   DM (diabetes mellitus), type 2 (HCC)   Acute respiratory failure with hypoxia due to flash pulmonary edema: Improving.  - Continue supplemental oxygen as needed to maintain adequate oxygenation.  - Treat conditions below  NSTEMI:  - Heparin IV, ASA 81mg  daily - LHC/RHC tentatively planned 1/24  Acute CHF, NOS:  - Echocardiogram pending.  - Continue lasix 40mg  IV BID, monitoring I/O, weights.   Possible newly diagnosed PAF: Most recent ECG shows sinus vs. ectopic atrial rhythm.  - Anticoagulation as above for NSTEMI, will discuss with cardiology after cath.  - Rate is currently controlled. Consider coreg vs. metop succinate. - TSH pending  T2DM:  - HbA1c still pending - Will need to initiate therapy and reestablish PCP care going forward.  - SSI  HLD: LDL 148, HDL 25 - Start atorvastatin  Gout: Quiescent currently.  -  Monitor clinically with diuresis.   AKI: Putative diagnosis to be specified with trend. Element of CKD to be expected with untreated DM.  Leukocytosis:  WBC 13.2k > 9.9k. Afebrile, no consolidation on CXR. Covid and flu negative. Monitor off abx for now.   Hyponatremia: Monitor with diuresis.   Suspected PAD: Diminished peripheral pulses without evidence of critical limb ischemia.  - ASA, statin as above, suggest vascular surgery follow up.  DVT prophylaxis: IV heparin Code Status: Full Family Communication: None at bedside this AM Disposition Plan:  Status is: Inpatient  Remains inpatient appropriate because: NSTEMI, respiratory failure  Consultants:  Cardiology  Procedures:  LHC/RHC planned  Antimicrobials: None   Subjective: Breathing much better, no chest complaints now. No bleeding. Had nausea with contrast given for what sounds like LLE angiogram previously. No respiratory complaints or skin changes at that time.   Objective: Vitals:   05/20/21 0630 05/20/21 0645 05/20/21 0730 05/20/21 0800  BP: 102/71 110/81 127/86 117/85  Pulse: 73 80 (!) 108 74  Resp: 17 16 16 17   Temp:      TempSrc:      SpO2: 100% 100% 100% 100%  Weight:      Height:        Intake/Output Summary (Last 24 hours) at 05/20/2021 1120 Last data filed at 05/20/2021 0747 Gross per 24 hour  Intake 91.83 ml  Output 650 ml  Net -558.17 ml   Filed Weights   05/19/21 1850  Weight: 81.6 kg    Gen: 66 y.o. male in no distress Pulm: Non-labored breathing supplemental oxygen with SpO2 100%. Clear  to auscultation bilaterally.  CV: Regular rate and rhythm. No murmur, rub, or gallop. No JVD, no pitting pedal edema. Diminished radial and DP pulses bilaterally.  GI: Abdomen soft, non-tender, non-distended, with normoactive bowel sounds. No organomegaly or masses felt. Ext: Warm, no deformities, brisk cap refill Skin: Eschar on left lower shin without surrounding erythema (from fire ant bite) without  other rashes, lesions or ulcers on visualized skin Neuro: Alert and oriented. No focal neurological deficits. Psych: Judgement and insight appear normal. Mood & affect appropriate.   Data Reviewed: I have personally reviewed following labs and imaging studies  CBC: Recent Labs  Lab 05/19/21 1832 05/19/21 1847 05/20/21 0340  WBC 13.2*  --  9.9  NEUTROABS 7.4  --   --   HGB 13.4 14.6 12.0*  HCT 42.3 43.0 35.9*  MCV 95.5  --  90.9  PLT 285  --  99991111   Basic Metabolic Panel: Recent Labs  Lab 05/19/21 1832 05/19/21 1847 05/20/21 0340  NA 130* 133* 134*  K 4.4 4.6 4.5  CL 103 103 105  CO2 18*  --  22  GLUCOSE 283* 274* 118*  BUN 15 16 18   CREATININE 1.40* 1.30* 1.48*  CALCIUM 8.4*  --  8.3*  MG  --   --  1.7   GFR: Estimated Creatinine Clearance: 48.3 mL/min (A) (by C-G formula based on SCr of 1.48 mg/dL (H)). Liver Function Tests: Recent Labs  Lab 05/19/21 1832 05/20/21 0340  AST 16 52*  ALT 11 14  ALKPHOS 50 44  BILITOT 0.5 0.7  PROT 6.9 6.2*  ALBUMIN 3.5 3.2*   No results for input(s): LIPASE, AMYLASE in the last 168 hours. No results for input(s): AMMONIA in the last 168 hours. Coagulation Profile: Recent Labs  Lab 05/19/21 1900  INR 1.1   Cardiac Enzymes: No results for input(s): CKTOTAL, CKMB, CKMBINDEX, TROPONINI in the last 168 hours. BNP (last 3 results) No results for input(s): PROBNP in the last 8760 hours. HbA1C: No results for input(s): HGBA1C in the last 72 hours. CBG: Recent Labs  Lab 05/19/21 1842  GLUCAP 184*   Lipid Profile: Recent Labs    05/19/21 1832  CHOL 204*  HDL 25*  LDLCALC 148*  TRIG 154*  CHOLHDL 8.2   Thyroid Function Tests: No results for input(s): TSH, T4TOTAL, FREET4, T3FREE, THYROIDAB in the last 72 hours. Anemia Panel: No results for input(s): VITAMINB12, FOLATE, FERRITIN, TIBC, IRON, RETICCTPCT in the last 72 hours. Urine analysis:    Component Value Date/Time   COLORURINE YELLOW 07/14/2016 [place] 07/14/2016 0551   LABSPEC 1.010 07/14/2016 0551   PHURINE 5.5 07/14/2016 0551   GLUCOSEU >=500 (A) 07/14/2016 0551   HGBUR NEGATIVE 07/14/2016 0551   BILIRUBINUR NEGATIVE 07/14/2016 0551   KETONESUR 15 (A) 07/14/2016 0551   PROTEINUR NEGATIVE 07/14/2016 0551   NITRITE NEGATIVE 07/14/2016 0551   LEUKOCYTESUR NEGATIVE 07/14/2016 0551   Recent Results (from the past 240 hour(s))  Resp Panel by RT-PCR (Flu A&B, Covid) Nasopharyngeal Swab     Status: None   Collection Time: 05/19/21  6:34 PM   Specimen: Nasopharyngeal Swab; Nasopharyngeal(NP) swabs in vial transport medium  Result Value Ref Range Status   SARS Coronavirus 2 by RT PCR NEGATIVE NEGATIVE Final    Comment: (NOTE) SARS-CoV-2 target nucleic acids are NOT DETECTED.  The SARS-CoV-2 RNA is generally detectable in upper respiratory specimens during the acute phase of infection. The lowest concentration of SARS-CoV-2 viral copies this assay  can detect is 138 copies/mL. A negative result does not preclude SARS-Cov-2 infection and should not be used as the sole basis for treatment or other patient management decisions. A negative result may occur with  improper specimen collection/handling, submission of specimen other than nasopharyngeal swab, presence of viral mutation(s) within the areas targeted by this assay, and inadequate number of viral copies(<138 copies/mL). A negative result must be combined with clinical observations, patient history, and epidemiological information. The expected result is Negative.  Fact Sheet for Patients:  EntrepreneurPulse.com.au  Fact Sheet for Healthcare Providers:  IncredibleEmployment.be  This test is no t yet approved or cleared by the [place] FDA and  has been authorized for detection and/or diagnosis of SARS-CoV-2 by FDA under an Emergency Use Authorization (EUA). This EUA will remain  in effect (meaning this test can be used)  for the duration of the COVID-19 declaration under Section 564(b)(1) of the Act, 21 U.S.C.section 360bbb-3(b)(1), unless the authorization is terminated  or revoked sooner.       Influenza A by PCR NEGATIVE NEGATIVE Final   Influenza B by PCR NEGATIVE NEGATIVE Final    Comment: (NOTE) The Xpert Xpress SARS-CoV-2/FLU/RSV plus assay is intended as an aid in the diagnosis of influenza from Nasopharyngeal swab specimens and should not be used as a sole basis for treatment. Nasal washings and aspirates are unacceptable for Xpert Xpress SARS-CoV-2/FLU/RSV testing.  Fact Sheet for Patients: EntrepreneurPulse.com.au  Fact Sheet for Healthcare Providers: IncredibleEmployment.be  This test is not yet approved or cleared by the [place] FDA and has been authorized for detection and/or diagnosis of SARS-CoV-2 by FDA under an Emergency Use Authorization (EUA). This EUA will remain in effect (meaning this test can be used) for the duration of the COVID-19 declaration under Section 564(b)(1) of the Act, 21 U.S.C. section 360bbb-3(b)(1), unless the authorization is terminated or revoked.  Performed at [facility], [place] [address], [place], [place] [address]       Radiology Studies: DG Chest Portable 1 View  Result Date: 05/19/2021 CLINICAL DATA:  Shortness of breath code STEMI EXAM: PORTABLE CHEST 1 VIEW COMPARISON:  09/16/2017 FINDINGS: Diffuse interstitial opacity, suspicious for edema. No pleural effusion. Borderline cardiomegaly. No pneumothorax IMPRESSION: Moderate diffuse interstitial opacity is suspicious for pulmonary edema. Electronically Signed   By: Donavan Foil M.D.   On: 05/19/2021 18:52    Scheduled Meds:  aspirin EC  81 mg Oral Daily   atorvastatin  80 mg Oral Daily   furosemide  40 mg Intravenous Q12H   insulin aspart  0-15 Units Subcutaneous TID WC   insulin aspart  0-5 Units Subcutaneous QHS   Continuous Infusions:   heparin 1,200 Units/hr (05/20/21 0747)   nitroGLYCERIN 10 mcg/min (05/20/21 0912)     LOS: 1 day     Patrecia Pour, MD [facility] [email or web address] 05/20/2021, 11:20 AM

## 2021-05-20 NOTE — Consult Note (Signed)
Cardiology Consultation:   Patient ID: [name] MRN: [id number]; DOB: June 12, 1955  Admit date: 05/19/2021 Date of Consult: 05/20/2021  Primary Care Provider: Merryl Hacker No [facility] Cardiologist: None  [facility] Electrophysiologist:  None   Patient Profile:   Antonio Cervantes is a 66 y.o. male with a hx of DM2 and distant prior tobacco use (~15 py, 40 years ago) who is being seen today for the evaluation of SOB/DOE at the request of Dr. Kristopher Oppenheim.  History of Present Illness:   Antonio Cervantes reports that he was at his house after dinner and was sitting down to watch TV when he noticed acute onset shortness of breath at rest.  He denied any chest pressure or pain.  He has had no recent orthopnea, PND, lower extremity swelling.  He does report that over the past week he has had new onset dyspnea on exertion without any associated symptoms and that whenever he stopped to catch his breath his symptoms were improving.  He sleeps on his side at night and does not have any orthopnea.  He has not noticed any weight gain or lower extremity swelling is actually lost almost 50 pounds in the past year intentionally with change in diet.  He lives at home with his mother and is otherwise functional on his own.  He has no restrictions to his ADLs.    On initial presentation he was initially activated for STEMI by EMS however ECG in the ED did not confirm this and so he was not taken to the Cath Lab emergently.  He has not had any prior coronary assessment or stress test.  He does report CT scan last year to "check for blood clots" but otherwise has had no recent imaging.  VS on ED presentation: P 120, BP 140/60, O2 84%/RA, pale/diaphoretic.   He was in respiratory distress on presentation.  Lab work notable for mild leukocytosis, elevated BNP (814), mild hyponatremia (Na 130), flu/covid negative, CXR with pulmonary edema. ECG with AF/RVR with subsequent ECG with NSR and lateral ST  depressions.  During my evaluation he is symptom-free following Lasix and BiPAP and on heparin gtt.   Past Medical History:  Diagnosis Date   Diabetes mellitus without complication ([place])    Diabetic peripheral neuropathy associated with type 2 diabetes mellitus ([place])    No past surgical history on file.   Home Medications:  Prior to Admission medications   Medication Sig Start Date End Date Taking? Authorizing Provider  indomethacin (INDOCIN) 25 MG capsule Take 25 mg by mouth 2 (two) times daily as needed (gout flares).   Yes [provider]  acetaminophen (TYLENOL) 325 MG tablet Take 2 tablets (650 mg total) by mouth every 6 (six) hours as needed. Patient not taking: Reported on 05/19/2021 07/14/16   Varney Biles, MD  ibuprofen (ADVIL,MOTRIN) 200 MG tablet Take 2 tablets (400 mg total) by mouth every 6 (six) hours as needed (for pain). Patient not taking: Reported on 05/19/2021 07/14/16   Varney Biles, MD  lidocaine (XYLOCAINE) 5 % ointment Apply 1 application topically as needed. Patient not taking: Reported on 05/19/2021 07/14/16   Varney Biles, MD  metFORMIN (GLUCOPHAGE) 500 MG tablet Take 1 tablet (500 mg total) by mouth 2 (two) times daily with a meal. Patient not taking: Reported on 05/19/2021 07/07/16   Ocie Cornfield T, PA-C  naproxen (NAPROSYN) 500 MG tablet Take 500 mg by mouth 2 (two) times daily with a meal. Patient not taking: Reported on 05/19/2021  [provider]    Inpatient Medications: Scheduled Meds:  furosemide  40 mg Intravenous Q12H   insulin aspart  0-15 Units Subcutaneous TID WC   insulin aspart  0-5 Units Subcutaneous QHS   Continuous Infusions:  heparin 1,000 Units/hr (05/20/21 0724)   PRN Meds: acetaminophen **OR** acetaminophen, albuterol, ondansetron **OR** ondansetron (ZOFRAN) IV  Allergies:   No Known Allergies  Social History:   Social History   Socioeconomic History   Marital status: Married    Spouse name: Not on  file   Number of children: Not on file   Years of education: Not on file   Highest education level: Not on file  Occupational History   Not on file  Tobacco Use   Smoking status: Former    Types: Cigarettes    Quit date: 52    Years since quitting: 40.0   Smokeless tobacco: Never  Substance and Sexual Activity   Alcohol use: No   Drug use: No   Sexual activity: Not on file  Other Topics Concern   Not on file  Social History Narrative   Not on file   Social Determinants of Health   Financial Resource Strain: Not on file  Food Insecurity: Not on file  Transportation Needs: Not on file  Physical Activity: Not on file  Stress: Not on file  Social Connections: Not on file  Intimate Partner Violence: Not on file    Family History:   No family history on file.   ROS:  Review of Systems: [y] = yes, [ ]  = no      General: Weight gain [ ] ; Weight loss [ ] ; Anorexia [ ] ; Fatigue [ ] ; Fever [ ] ; Chills [ ] ; Weakness [ ]    Cardiac: Chest pain/pressure [ ] ; Resting SOB [y]; Exertional SOB [y]; Orthopnea [ ] ; Pedal Edema [ ] ; Palpitations [ ] ; Syncope [ ] ; Presyncope [ ] ; Paroxysmal nocturnal dyspnea [ ]    Pulmonary: Cough [ ] ; Wheezing [ ] ; Hemoptysis [ ] ; Sputum [ ] ; Snoring [ ]    GI: Vomiting [ ] ; Dysphagia [ ] ; Melena [ ] ; Hematochezia [ ] ; Heartburn [ ] ; Abdominal pain [ ] ; Constipation [ ] ; Diarrhea [ ] ; BRBPR [ ]    GU: Hematuria [ ] ; Dysuria [ ] ; Nocturia [ ]  Vascular: Pain in legs with walking [ ] ; Pain in feet with lying flat [ ] ; Non-healing sores [ ] ; Stroke [ ] ; TIA [ ] ; Slurred speech [ ] ;   Neuro: Headaches [ ] ; Vertigo [ ] ; Seizures [ ] ; Paresthesias [ ] ;Blurred vision [ ] ; Diplopia [ ] ; Vision changes [ ]    Ortho/Skin: Arthritis [ ] ; Joint pain [ ] ; Muscle pain [ ] ; Joint swelling [ ] ; Back Pain [ ] ; Rash [ ]    Psych: Depression [ ] ; Anxiety [ ]    Heme: Bleeding problems [ ] ; Clotting disorders [ ] ; Anemia [ ]    Endocrine: Diabetes [ ] ; Thyroid dysfunction [ ]     Physical Exam/Data:   Vitals:   05/20/21 0600 05/20/21 0615 05/20/21 0630 05/20/21 0645  BP: 103/75 105/77 102/71 110/81  Pulse: 75 75 73 80  Resp: 15 17 17 16   Temp:      TempSrc:      SpO2: 100% 100% 100% 100%  Weight:      Height:        Intake/Output Summary (Last 24 hours) at 05/20/2021 0727 Last data filed at 05/20/2021 0724 Gross per 24 hour  Intake 87.98 ml  Output  650 ml  Net -562.02 ml   Last 3 Weights 05/19/2021 07/14/2016 07/07/2016  Weight (lbs) 180 lb 199 lb 199 lb  Weight (kg) 81.647 kg 90.266 kg 90.266 kg     Body mass index is 29.95 kg/m.  General:  Well nourished, well developed, in no acute distress HEENT: normal Lymph: no adenopathy Neck: no JVD Endocrine:  No thryomegaly Vascular: No carotid bruits; FA pulses 2+ bilaterally without bruits  Cardiac:  normal S1, S2; RRR; no murmur  Lungs:  clear to auscultation bilaterally, no wheezing, rhonchi or rales  Abd: soft, nontender, no hepatomegaly  Ext: no edema Musculoskeletal:  No deformities, BUE and BLE strength normal and equal Skin: warm and dry  Neuro:  CNs 2-12 intact, no focal abnormalities noted Psych:  Normal affect   EKG:  The EKG was personally reviewed and demonstrates: (05/19/21, 18:31:34) with AF/RVR with ventricular rate of 111, PR 192, QRS 111, Qtc 411. Repeat ECG with NSR (05/19/21, 21:44:39), ventricular rate 94, PR 237, QRS 94, Qtc 476, lateral ST depressions and 1st deg AVB Telemetry:  Telemetry was personally reviewed and demonstrates: AF and NSR  Relevant CV Studies: None   Laboratory Data:  High Sensitivity Troponin:   Recent Labs  Lab 05/19/21 1832 05/19/21 2032  TROPONINIHS 17 1,188*     Chemistry Recent Labs  Lab 05/19/21 1832 05/19/21 1847 05/20/21 0340  NA 130* 133* 134*  K 4.4 4.6 4.5  CL 103 103 105  CO2 18*  --  22  GLUCOSE 283* 274* 118*  BUN 15 16 18   CREATININE 1.40* 1.30* 1.48*  CALCIUM 8.4*  --  8.3*  GFRNONAA 55*  --  52*  ANIONGAP 9  --  7     Recent Labs  Lab 05/19/21 1832 05/20/21 0340  PROT 6.9 6.2*  ALBUMIN 3.5 3.2*  AST 16 52*  ALT 11 14  ALKPHOS 50 44  BILITOT 0.5 0.7   Hematology Recent Labs  Lab 05/19/21 1832 05/19/21 1847 05/20/21 0340  WBC 13.2*  --  9.9  RBC 4.43  --  3.95*  HGB 13.4 14.6 12.0*  HCT 42.3 43.0 35.9*  MCV 95.5  --  90.9  MCH 30.2  --  30.4  MCHC 31.7  --  33.4  RDW 13.6  --  13.5  PLT 285  --  257   BNP Recent Labs  Lab 05/19/21 1847  BNP 814.9*    DDimer No results for input(s): DDIMER in the last 168 hours.  Radiology/Studies:  DG Chest Portable 1 View  Result Date: 05/19/2021 CLINICAL DATA:  Shortness of breath code STEMI EXAM: PORTABLE CHEST 1 VIEW COMPARISON:  09/16/2017 FINDINGS: Diffuse interstitial opacity, suspicious for edema. No pleural effusion. Borderline cardiomegaly. No pneumothorax IMPRESSION: Moderate diffuse interstitial opacity is suspicious for pulmonary edema. Electronically Signed   By: Donavan Foil M.D.   On: 05/19/2021 18:52    TIMI Risk Score for Unstable Angina or Non-ST Elevation MI:   The patient's TIMI risk score is 2, which indicates a 8% risk of all cause mortality, new or recurrent myocardial infarction or need for urgent revascularization in the next 14 days.  Assessment and Plan:   NSTEMI AF/RVR DM2 HF Antonio Cervantes presents for signs and symptoms consistent with ACS along with significant hsT delta (17->1118).  He has had symptoms of stable angina and possible heart failure the past week but had an acute episode that presentation consistent with an NSTEMI.  Risk factors include DM2 without any medications  and distant tobacco use.  He does not have significant family history of premature CAD or sudden cardiac death.  Plan for echo this morning and coronary angiography (+/- RHC) following echo.  He is currently symptom-free other than mild shortness of breath at rest which has significantly improved from presentation.  Plan to continue diuresis  with elevated BNP and pulmonary edema on presentation.  New onset AF/RVR now with CHA2DS2-VASc (age, DM2, CAD) of 4 so should be continued on [place] at discharge.  Plan for aspirin/Plavix if obstructive CAD present with plan for transition to Plavix/apixaban at discharge. - continue heparin gtt - start asa 81 mg daily  - continue lasix 40 mg IV bid - TTE today  - coronary angiography (+/- RHC) today/tomorrow pending availability and sx, RRA access, no prior issues with contrast, no recent bleeding  - start atorva 80 mg daily, LDL (148), TG (154) - A1c pending, TSH added on  - coreg 3.125 mg bid started, HR 70s, BP  - hold ACEi/ARB and BB with borderline BP (100/70s), have HR to add BB once stable (HR 70s)  For questions or updates, please contact [facility] Please consult [email or web address] for contact info under   Signed, Dion Body, MD  05/20/2021 7:27 AM

## 2021-05-20 NOTE — Progress Notes (Signed)
ANTICOAGULATION CONSULT NOTE  Pharmacy Consult for heparin Indication: chest pain/ACS  No Known Allergies  Patient Measurements: Height: 5\' 5"  (165.1 cm) Weight: 81.6 kg (180 lb) IBW/kg (Calculated) : 61.5 Heparin Dosing Weight: 78 kg   Vital Signs: BP: 99/71 (01/23 1500) Pulse Rate: 71 (01/23 1500)  Labs: Recent Labs    05/19/21 1832 05/19/21 1847 05/19/21 1900 05/19/21 2032 05/20/21 0340 05/20/21 0655 05/20/21 1420  HGB 13.4 14.6  --   --  12.0*  --  11.3*  HCT 42.3 43.0  --   --  35.9*  --  34.1*  PLT 285  --   --   --  257  --  242  APTT  --   --  29  --   --   --   --   LABPROT  --   --  14.2  --   --   --   --   INR  --   --  1.1  --   --   --   --   HEPARINUNFRC  --   --   --   --   --  0.14* 0.33  CREATININE 1.40* 1.30*  --   --  1.48*  --   --   TROPONINIHS 17  --   --  1,188*  --   --   --      Estimated Creatinine Clearance: 48.3 mL/min (A) (by C-G formula based on SCr of 1.48 mg/dL (H)).   Medical History: Past Medical History:  Diagnosis Date   Diabetes mellitus without complication ([place])    Diabetic peripheral neuropathy associated with type 2 diabetes mellitus ([place])     Medications:  (Not in a hospital admission)  Assessment: 53 YOM who presents with respiratory distress and chest burning. Pharmacy consulted to start IV heparin.   Heparin level 0.31 on check and therapeutic, however on lower end of goal. Will plan to proactively increase by 50 units/h to keep in goal.  Hgb decreased at 11.3, platelets within normal limits. No signs of bleeding noted  Goal of Therapy:  Heparin level 0.3-0.7 units/ml Monitor platelets by anticoagulation protocol: Yes   Plan:  Increase IV heparin gtt to 1250 units/hr 6h confirmatory heparin level check Daily heparin level, CBC Monitor for signs/symptoms of bleeding  Thank you for involving pharmacy in this patient's care.  Elita Quick, PharmD PGY1 Ambulatory Care Pharmacy Resident 05/20/2021 4:15  PM  **Pharmacist phone directory can be found on [email or web address] listed under [place]**

## 2021-05-20 NOTE — TOC Benefit Eligibility Note (Addendum)
Patient Product/process development scientist completed.    The patient is currently admitted and upon discharge could be taking Eliquis 5 mg.  The current 30 day co-pay is, $47.00.   The patient is currently admitted and upon discharge could be taking Entresto 24-26 mg.  The current 30 day co-pay is, $47.00.   The patient is currently admitted and upon discharge could be taking Farxiga 10 mg.  The current 30 day co-pay is, $47.00.   The patient is currently admitted and upon discharge could be taking Jardiance 10 mg.  The current 30 day co-pay is, $47.00.   The patient is insured through [employer]     Antonio Cervantes, CPhT Pharmacy Patient Advocate Specialist [facility] Patient Advocate Team Direct Number: [phone]  Fax: [phone]

## 2021-05-21 ENCOUNTER — Encounter (HOSPITAL_COMMUNITY): Payer: Self-pay | Admitting: Internal Medicine

## 2021-05-21 ENCOUNTER — Other Ambulatory Visit (HOSPITAL_COMMUNITY): Payer: Self-pay

## 2021-05-21 ENCOUNTER — Encounter (HOSPITAL_COMMUNITY)
Admission: EM | Disposition: A | Discharge status: Home / Self Care | Payer: Self-pay | Source: Home / Self Care | Attending: Internal Medicine

## 2021-05-21 DIAGNOSIS — I5021 Acute systolic (congestive) heart failure: Secondary | ICD-10-CM

## 2021-05-21 DIAGNOSIS — I2510 Atherosclerotic heart disease of native coronary artery without angina pectoris: Secondary | ICD-10-CM

## 2021-05-21 DIAGNOSIS — E1169 Type 2 diabetes mellitus with other specified complication: Secondary | ICD-10-CM

## 2021-05-21 HISTORY — PX: RIGHT/LEFT HEART CATH AND CORONARY ANGIOGRAPHY: CATH118266

## 2021-05-21 LAB — CBC
HCT: 35.2 % — ABNORMAL LOW (ref 39.0–52.0)
Hemoglobin: 11.7 g/dL — ABNORMAL LOW (ref 13.0–17.0)
MCH: 30.1 pg (ref 26.0–34.0)
MCHC: 33.2 g/dL (ref 30.0–36.0)
MCV: 90.5 fL (ref 80.0–100.0)
Platelets: 239 10*3/uL (ref 150–400)
RBC: 3.89 MIL/uL — ABNORMAL LOW (ref 4.22–5.81)
RDW: 13.3 % (ref 11.5–15.5)
WBC: 9.8 10*3/uL (ref 4.0–10.5)
nRBC: 0 % (ref 0.0–0.2)

## 2021-05-21 LAB — POCT I-STAT EG7
Acid-Base Excess: 0 mmol/L (ref 0.0–2.0)
Acid-base deficit: 2 mmol/L (ref 0.0–2.0)
Bicarbonate: 23.6 mmol/L (ref 20.0–28.0)
Bicarbonate: 25.3 mmol/L (ref 20.0–28.0)
Calcium, Ion: 1.21 mmol/L (ref 1.15–1.40)
Calcium, Ion: 1.21 mmol/L (ref 1.15–1.40)
HCT: 37 % — ABNORMAL LOW (ref 39.0–52.0)
HCT: 38 % — ABNORMAL LOW (ref 39.0–52.0)
Hemoglobin: 12.6 g/dL — ABNORMAL LOW (ref 13.0–17.0)
Hemoglobin: 12.9 g/dL — ABNORMAL LOW (ref 13.0–17.0)
O2 Saturation: 69 %
O2 Saturation: 71 %
Potassium: 4.1 mmol/L (ref 3.5–5.1)
Potassium: 4.1 mmol/L (ref 3.5–5.1)
Sodium: 134 mmol/L — ABNORMAL LOW (ref 135–145)
Sodium: 135 mmol/L (ref 135–145)
TCO2: 25 mmol/L (ref 22–32)
TCO2: 27 mmol/L (ref 22–32)
pCO2, Ven: 42.6 mmHg — ABNORMAL LOW (ref 44.0–60.0)
pCO2, Ven: 44.5 mmHg (ref 44.0–60.0)
pH, Ven: 7.352 (ref 7.250–7.430)
pH, Ven: 7.363 (ref 7.250–7.430)
pO2, Ven: 38 mmHg (ref 32.0–45.0)
pO2, Ven: 39 mmHg (ref 32.0–45.0)

## 2021-05-21 LAB — BASIC METABOLIC PANEL
Anion gap: 11 (ref 5–15)
BUN: 21 mg/dL (ref 8–23)
CO2: 20 mmol/L — ABNORMAL LOW (ref 22–32)
Calcium: 8.6 mg/dL — ABNORMAL LOW (ref 8.9–10.3)
Chloride: 99 mmol/L (ref 98–111)
Creatinine, Ser: 1.6 mg/dL — ABNORMAL HIGH (ref 0.61–1.24)
GFR, Estimated: 47 mL/min — ABNORMAL LOW (ref >60)
Glucose, Bld: 104 mg/dL — ABNORMAL HIGH (ref 70–99)
Potassium: 4.2 mmol/L (ref 3.5–5.1)
Sodium: 130 mmol/L — ABNORMAL LOW (ref 135–145)

## 2021-05-21 LAB — POCT I-STAT 7, (LYTES, BLD GAS, ICA,H+H)
Acid-base deficit: 1 mmol/L (ref 0.0–2.0)
Bicarbonate: 23.8 mmol/L (ref 20.0–28.0)
Calcium, Ion: 1.17 mmol/L (ref 1.15–1.40)
HCT: 37 % — ABNORMAL LOW (ref 39.0–52.0)
Hemoglobin: 12.6 g/dL — ABNORMAL LOW (ref 13.0–17.0)
O2 Saturation: 99 %
Potassium: 4.1 mmol/L (ref 3.5–5.1)
Sodium: 134 mmol/L — ABNORMAL LOW (ref 135–145)
TCO2: 25 mmol/L (ref 22–32)
pCO2 arterial: 37.4 mmHg (ref 32.0–48.0)
pH, Arterial: 7.41 (ref 7.350–7.450)
pO2, Arterial: 123 mmHg — ABNORMAL HIGH (ref 83.0–108.0)

## 2021-05-21 LAB — HEPARIN LEVEL (UNFRACTIONATED): Heparin Unfractionated: 0.38 IU/mL (ref 0.30–0.70)

## 2021-05-21 LAB — TROPONIN I (HIGH SENSITIVITY): Troponin I (High Sensitivity): 8787 ng/L (ref <18)

## 2021-05-21 LAB — GLUCOSE, CAPILLARY
Glucose-Capillary: 110 mg/dL — ABNORMAL HIGH (ref 70–99)
Glucose-Capillary: 106 mg/dL — ABNORMAL HIGH (ref 70–99)
Glucose-Capillary: 88 mg/dL (ref 70–99)
Glucose-Capillary: 142 mg/dL — ABNORMAL HIGH (ref 70–99)

## 2021-05-21 LAB — HEMOGLOBIN A1C
Hgb A1c MFr Bld: 7.3 % — ABNORMAL HIGH (ref 4.8–5.6)
Mean Plasma Glucose: 163 mg/dL

## 2021-05-21 LAB — TSH: TSH: 108.608 u[IU]/mL — ABNORMAL HIGH (ref 0.350–4.500)

## 2021-05-21 SURGERY — RIGHT/LEFT HEART CATH AND CORONARY ANGIOGRAPHY
Anesthesia: LOCAL

## 2021-05-21 MED ORDER — SODIUM CHLORIDE 0.9 % IV SOLN: 250.0000 mL | INTRAVENOUS | Status: DC | PRN

## 2021-05-21 MED ORDER — HEPARIN SODIUM (PORCINE) 1000 UNIT/ML IJ SOLN
INTRAMUSCULAR | Status: DC | PRN
  Administered 2021-05-21: 4000 [IU] via INTRAVENOUS

## 2021-05-21 MED ORDER — MIDAZOLAM HCL 2 MG/2ML IJ SOLN
INTRAMUSCULAR | Status: AC
  Filled 2021-05-21: qty 2

## 2021-05-21 MED ORDER — HYDRALAZINE HCL 20 MG/ML IJ SOLN: 10.0000 mg | INTRAMUSCULAR | Status: AC | PRN

## 2021-05-21 MED ORDER — VERAPAMIL HCL 2.5 MG/ML IV SOLN
INTRAVENOUS | Status: AC
  Filled 2021-05-21: qty 2

## 2021-05-21 MED ORDER — LIDOCAINE HCL (PF) 1 % IJ SOLN
INTRAMUSCULAR | Status: DC | PRN
  Administered 2021-05-21 (×2): 2 mL

## 2021-05-21 MED ORDER — HEPARIN (PORCINE) IN NACL 1000-0.9 UT/500ML-% IV SOLN
INTRAVENOUS | Status: DC | PRN
  Administered 2021-05-21 (×2): 500 mL

## 2021-05-21 MED ORDER — ASPIRIN 81 MG PO CHEW: 81.0000 mg | CHEWABLE_TABLET | ORAL | Status: DC

## 2021-05-21 MED ORDER — SODIUM CHLORIDE 0.9 % IV SOLN: INTRAVENOUS | Status: DC

## 2021-05-21 MED ORDER — MIDAZOLAM HCL 2 MG/2ML IJ SOLN
INTRAMUSCULAR | Status: DC | PRN
  Administered 2021-05-21: 2 mg via INTRAVENOUS

## 2021-05-21 MED ORDER — HEPARIN SODIUM (PORCINE) 1000 UNIT/ML IJ SOLN
INTRAMUSCULAR | Status: AC
  Filled 2021-05-21: qty 10

## 2021-05-21 MED ORDER — LABETALOL HCL 5 MG/ML IV SOLN: 10.0000 mg | INTRAVENOUS | Status: AC | PRN

## 2021-05-21 MED ORDER — SODIUM CHLORIDE 0.9% FLUSH: 3.0000 mL | INTRAVENOUS | Status: DC | PRN

## 2021-05-21 MED ORDER — ONDANSETRON HCL 4 MG/2ML IJ SOLN: 4.0000 mg | Freq: Four times a day (QID) | INTRAMUSCULAR | Status: DC | PRN

## 2021-05-21 MED ORDER — SODIUM CHLORIDE 0.9 % WEIGHT BASED INFUSION
3.0000 mL/kg/h | INTRAVENOUS | Status: DC
  Administered 2021-05-21: 11:00:00 3 mL/kg/h via INTRAVENOUS

## 2021-05-21 MED ORDER — VERAPAMIL HCL 2.5 MG/ML IV SOLN
INTRAVENOUS | Status: DC | PRN
  Administered 2021-05-21 (×2): 10 mL via INTRA_ARTERIAL

## 2021-05-21 MED ORDER — CLOPIDOGREL BISULFATE 75 MG PO TABS
ORAL_TABLET | ORAL | Status: AC
  Filled 2021-05-21: qty 1

## 2021-05-21 MED ORDER — METOPROLOL SUCCINATE ER 25 MG PO TB24
12.5000 mg | ORAL_TABLET | Freq: Every day | ORAL | Status: DC
  Administered 2021-05-22 – 2021-05-23 (×2): 12.5 mg via ORAL
  Filled 2021-05-21 (×3): qty 1

## 2021-05-21 MED ORDER — DAPAGLIFLOZIN PROPANEDIOL 10 MG PO TABS
10.0000 mg | ORAL_TABLET | Freq: Every day | ORAL | Status: DC
  Administered 2021-05-21 – 2021-05-23 (×3): 10 mg via ORAL
  Filled 2021-05-21 (×3): qty 1

## 2021-05-21 MED ORDER — CLOPIDOGREL BISULFATE 75 MG PO TABS
75.0000 mg | ORAL_TABLET | Freq: Every day | ORAL | Status: DC
  Administered 2021-05-21: 19:00:00 75 mg via ORAL

## 2021-05-21 MED ORDER — SODIUM CHLORIDE 0.9% FLUSH
3.0000 mL | Freq: Two times a day (BID) | INTRAVENOUS | Status: DC
  Administered 2021-05-21 – 2021-05-23 (×3): 3 mL via INTRAVENOUS

## 2021-05-21 MED ORDER — LEVOTHYROXINE SODIUM 100 MCG/5ML IV SOLN
25.0000 ug | Freq: Every day | INTRAVENOUS | Status: DC
  Administered 2021-05-21: 09:00:00 25 ug via INTRAVENOUS
  Filled 2021-05-21 (×2): qty 5

## 2021-05-21 MED ORDER — FENTANYL CITRATE (PF) 100 MCG/2ML IJ SOLN
INTRAMUSCULAR | Status: DC | PRN
  Administered 2021-05-21: 25 ug via INTRAVENOUS

## 2021-05-21 MED ORDER — HEPARIN (PORCINE) IN NACL 1000-0.9 UT/500ML-% IV SOLN
INTRAVENOUS | Status: AC
  Filled 2021-05-21: qty 1000

## 2021-05-21 MED ORDER — LIDOCAINE HCL (PF) 1 % IJ SOLN
INTRAMUSCULAR | Status: AC
  Filled 2021-05-21: qty 30

## 2021-05-21 MED ORDER — IOHEXOL 350 MG/ML SOLN
INTRAVENOUS | Status: DC | PRN
  Administered 2021-05-21: 17:00:00 40 mL

## 2021-05-21 MED ORDER — DIGOXIN 125 MCG PO TABS
0.1250 mg | ORAL_TABLET | Freq: Every day | ORAL | Status: DC
  Administered 2021-05-21 – 2021-05-23 (×3): 0.125 mg via ORAL
  Filled 2021-05-21 (×3): qty 1

## 2021-05-21 MED ORDER — ACETAMINOPHEN 325 MG PO TABS
650.0000 mg | ORAL_TABLET | ORAL | Status: DC | PRN
  Administered 2021-05-22: 08:00:00 650 mg via ORAL
  Filled 2021-05-21: qty 2

## 2021-05-21 MED ORDER — FENTANYL CITRATE (PF) 100 MCG/2ML IJ SOLN
INTRAMUSCULAR | Status: AC
  Filled 2021-05-21: qty 2

## 2021-05-21 MED ORDER — SODIUM CHLORIDE 0.9 % WEIGHT BASED INFUSION: 1.0000 mL/kg/h | INTRAVENOUS | Status: DC

## 2021-05-21 SURGICAL SUPPLY — 12 items
CATH 5FR JL3.5 JR4 ANG PIG MP (CATHETERS) ×1 IMPLANT
CATH BALLN WEDGE 5F 110CM (CATHETERS) ×1 IMPLANT
DEVICE RAD COMP TR BAND LRG (VASCULAR PRODUCTS) ×1 IMPLANT
GLIDESHEATH SLEND SS 6F .021 (SHEATH) ×1 IMPLANT
GUIDEWIRE INQWIRE 1.5J.035X260 (WIRE) IMPLANT
INQWIRE 1.5J .035X260CM (WIRE) ×2
KIT HEART LEFT (KITS) ×2 IMPLANT
PACK CARDIAC CATHETERIZATION (CUSTOM PROCEDURE TRAY) ×2 IMPLANT
SHEATH GLIDE SLENDER 4/5FR (SHEATH) ×1 IMPLANT
SHEATH PROBE COVER 6X72 (BAG) ×1 IMPLANT
TRANSDUCER W/STOPCOCK (MISCELLANEOUS) ×2 IMPLANT
TUBING CIL FLEX 10 FLL-RA (TUBING) ×2 IMPLANT

## 2021-05-21 NOTE — Progress Notes (Signed)
Progress Note  Patient Name: Antonio Cervantes Date of Encounter: 05/21/2021  [facility] HeartCare Cardiologist: None New  Subjective   No chest pain. Breathing better.  Having cath at time of note.  Inpatient Medications    Scheduled Meds:  aspirin  81 mg Oral Pre-Cath   [MAR Hold] aspirin EC  81 mg Oral Daily   [MAR Hold] atorvastatin  80 mg Oral Daily   [MAR Hold] insulin aspart  0-15 Units Subcutaneous TID WC   [MAR Hold] insulin aspart  0-5 Units Subcutaneous QHS   [MAR Hold] levothyroxine  25 mcg Intravenous Daily   [MAR Hold] sodium chloride flush  3 mL Intravenous Q12H   Continuous Infusions:  sodium chloride     sodium chloride 10 mL/hr at 05/21/21 0924   sodium chloride 1 mL/kg/hr (05/21/21 1225)   heparin 1,250 Units/hr (05/20/21 1634)   nitroGLYCERIN 10 mcg/min (05/20/21 0912)   PRN Meds: sodium chloride, [MAR Hold] acetaminophen **OR** [MAR Hold] acetaminophen, [MAR Hold] albuterol, fentaNYL, Heparin (Porcine) in NaCl, heparin sodium (porcine), iohexol, lidocaine (PF), midazolam, [MAR Hold] ondansetron **OR** [MAR Hold] ondansetron (ZOFRAN) IV, ondansetron (ZOFRAN) IV, Radial Cocktail/Verapamil only, sodium chloride flush   Vital Signs    Vitals:   05/21/21 1627 05/21/21 1632 05/21/21 1656 05/21/21 1700  BP: 116/71 107/70  107/67  Pulse: 83 85 78 79  Resp: 13 13 14 15   Temp:      TempSrc:      SpO2: 99% 100% 96% 99%  Weight:      Height:        Intake/Output Summary (Last 24 hours) at 05/21/2021 1726 Last data filed at 05/21/2021 1511 Gross per 24 hour  Intake 1189.8 ml  Output 600 ml  Net 589.8 ml   Last 3 Weights 05/19/2021 07/14/2016 07/07/2016  Weight (lbs) 180 lb 199 lb 199 lb  Weight (kg) 81.647 kg 90.266 kg 90.266 kg      Telemetry    Atrial fib with borderline rate control. - Personally Reviewed  ECG    Not repeated today. - Personally Reviewed  Physical Exam  Appears older than chronological age. HHands and skin warm and dry c/w  yesterday. GEN: No acute distress.   Neck: No JVD Cardiac: RRR, no murmurs, rubs, or gallops.  Respiratory: Clear to auscultation bilaterally. GI: Soft, nontender, non-distended  MS: No edema; No deformity. Neuro:  Nonfocal  Psych: Normal affect   Labs    High Sensitivity Troponin:   Recent Labs  Lab 05/19/21 1832 05/19/21 2032 05/21/21 0101  TROPONINIHS 17 1,188* 8,787*     Chemistry Recent Labs  Lab 05/19/21 1832 05/19/21 1847 05/20/21 0340 05/21/21 0101 05/21/21 1617 05/21/21 1622  NA 130* 133* 134* 130* 134* 135   134*  K 4.4 4.6 4.5 4.2 4.1 4.1   4.1  CL 103 103 105 99  --   --   CO2 18*  --  22 20*  --   --   GLUCOSE 283* 274* 118* 104*  --   --   BUN 15 16 18 21   --   --   CREATININE 1.40* 1.30* 1.48* 1.60*  --   --   CALCIUM 8.4*  --  8.3* 8.6*  --   --   MG  --   --  1.7  --   --   --   PROT 6.9  --  6.2*  --   --   --   ALBUMIN 3.5  --  3.2*  --   --   --  AST 16  --  52*  --   --   --   ALT 11  --  14  --   --   --   ALKPHOS 50  --  44  --   --   --   BILITOT 0.5  --  0.7  --   --   --   GFRNONAA 55*  --  52* 47*  --   --   ANIONGAP 9  --  7 11  --   --     Lipids  Recent Labs  Lab 05/19/21 1832  CHOL 204*  TRIG 154*  HDL 25*  LDLCALC 148*  CHOLHDL 8.2    Hematology Recent Labs  Lab 05/20/21 0340 05/20/21 1420 05/21/21 0101 05/21/21 1617 05/21/21 1622  WBC 9.9 9.5 9.8  --   --   RBC 3.95* 3.69* 3.89*  --   --   HGB 12.0* 11.3* 11.7* 12.6* 12.6*   12.9*  HCT 35.9* 34.1* 35.2* 37.0* 37.0*   38.0*  MCV 90.9 92.4 90.5  --   --   MCH 30.4 30.6 30.1  --   --   MCHC 33.4 33.1 33.2  --   --   RDW 13.5 13.6 13.3  --   --   PLT 257 242 239  --   --    Thyroid  Recent Labs  Lab 05/20/21 2231  TSH 108.608*  FREET4 0.46*    BNP Recent Labs  Lab 05/19/21 1847  BNP 814.9*    DDimer No results for input(s): DDIMER in the last 168 hours.   Radiology    CARDIAC CATHETERIZATION  Result Date: 05/21/2021   Prox LAD to Mid LAD lesion  is 75% stenosed.   Dist LAD lesion is 80% stenosed.   1st Diag lesion is 80% stenosed.   Ost RCA to Mid RCA lesion is 99% stenosed.   2nd Mrg lesion is 99% stenosed.   Dist Cx lesion is 90% stenosed.   Prox Cx to Mid Cx lesion is 75% stenosed.   LV end diastolic pressure is normal.   There is no aortic valve stenosis. Severe, multivessel coronary artery disease.  Normal LVEDP. Given diffuse, distal vessel disease, there are no good options for revascularization.  Plan for medical therapy to try to improve his LV function.  Probable heart failure consultation as well.   DG Chest Portable 1 View  Result Date: 05/19/2021 CLINICAL DATA:  Shortness of breath code STEMI EXAM: PORTABLE CHEST 1 VIEW COMPARISON:  09/16/2017 FINDINGS: Diffuse interstitial opacity, suspicious for edema. No pleural effusion. Borderline cardiomegaly. No pneumothorax IMPRESSION: Moderate diffuse interstitial opacity is suspicious for pulmonary edema. Electronically Signed   By: Donavan Foil M.D.   On: 05/19/2021 18:52   ECHOCARDIOGRAM COMPLETE  Result Date: 05/20/2021    ECHOCARDIOGRAM REPORT   Patient Name:   Antonio Cervantes Date of Exam: 05/20/2021 Medical Rec #:  [id number]         Height:       65.0 in Accession #:    [id number]        Weight:       180.0 lb Date of Birth:  [birth year]        BSA:          1.892 m Patient Age:    66 years          BP:           113/81 mmHg Patient Gender: M  HR:           78 bpm. Exam Location:  Inpatient Procedure: 2D Echo, 3D Echo, Cardiac Doppler and Color Doppler REPORT CONTAINS CRITICAL RESULT Indications:    I50.40* Unspecified combined systolic (congestive) and diastolic                 (congestive) heart failure  History:        Patient has no prior history of Echocardiogram examinations.                 Abnormal ECG, Arrythmias:Atrial Fibrillation,                 Signs/Symptoms:Shortness of Breath and Dyspnea; Risk                 Factors:Diabetes. Pulmoary edema.   Sonographer:    Roseanna Rainbow RDCS Referring Phys: [place]  1. Septal apical mid/distal anterior wall and inferior apex akinetic suggesting LAD infarct. Left ventricular ejection fraction, by estimation, is 20 to 25%. The left ventricle has severely decreased function. The left ventricle demonstrates regional wall motion abnormalities (see scoring diagram/findings for description). The left ventricular internal cavity size was severely dilated. There is mild left ventricular hypertrophy. Left ventricular diastolic parameters were normal.  2. Right ventricular systolic function is normal. The right ventricular size is normal. There is normal pulmonary artery systolic pressure.  3. The mitral valve is normal in structure. Mild mitral valve regurgitation. No evidence of mitral stenosis.  4. Degree of AS hard to judge due to severe decrease in LV function DVI 0.55 AVA 2 cm2 but morphology and gradients suggest AS could be moderate . The aortic valve is normal in structure. There is moderate calcification of the aortic valve. There is moderate thickening of the aortic valve. Aortic valve regurgitation is mild. Aortic valve sclerosis/calcification is present, without any evidence of aortic stenosis.  5. The inferior vena cava is normal in size with greater than 50% respiratory variability, suggesting right atrial pressure of 3 mmHg. FINDINGS  Left Ventricle: Septal apical mid/distal anterior wall and inferior apex akinetic suggesting LAD infarct. Left ventricular ejection fraction, by estimation, is 20 to 25%. The left ventricle has severely decreased function. The left ventricle demonstrates regional wall motion abnormalities. The left ventricular internal cavity size was severely dilated. There is mild left ventricular hypertrophy. Left ventricular diastolic parameters were normal. Right Ventricle: The right ventricular size is normal. No increase in right ventricular wall thickness. Right ventricular  systolic function is normal. There is normal pulmonary artery systolic pressure. The tricuspid regurgitant velocity is 2.35 m/s, and  with an assumed right atrial pressure of 3 mmHg, the estimated right ventricular systolic pressure is [id number] mmHg. Left Atrium: Left atrial size was normal in size. Right Atrium: Right atrial size was normal in size. Pericardium: There is no evidence of pericardial effusion. Mitral Valve: The mitral valve is normal in structure. Mild mitral valve regurgitation. No evidence of mitral valve stenosis. Tricuspid Valve: The tricuspid valve is normal in structure. Tricuspid valve regurgitation is not demonstrated. No evidence of tricuspid stenosis. Aortic Valve: Degree of AS hard to judge due to severe decrease in LV function DVI 0.55 AVA 2 cm2 but morphology and gradients suggest AS could be moderate. The aortic valve is normal in structure. There is moderate calcification of the aortic valve. There is moderate thickening of the aortic valve. Aortic valve regurgitation is mild. Aortic regurgitation PHT measures 358 msec. Aortic valve sclerosis/calcification is  present, without any evidence of aortic stenosis. Aortic valve mean gradient measures 10.0 mmHg. Aortic valve peak gradient measures 17.5 mmHg. Aortic valve area, by VTI measures 1.74 cm. Pulmonic Valve: The pulmonic valve was normal in structure. Pulmonic valve regurgitation is not visualized. No evidence of pulmonic stenosis. Aorta: The aortic root is normal in size and structure. Venous: The inferior vena cava is normal in size with greater than 50% respiratory variability, suggesting right atrial pressure of 3 mmHg. IAS/Shunts: No atrial level shunt detected by color flow Doppler.  LEFT VENTRICLE PLAX 2D LVIDd:         5.10 cm     Diastology LVIDs:         4.30 cm     LV e' medial:    4.88 cm/s LV PW:         1.00 cm     LV E/e' medial:  13.9 LV IVS:        1.10 cm     LV e' lateral:   7.13 cm/s LVOT diam:     2.00 cm     LV E/e'  lateral: 9.5 LV SV:         69 LV SV Index:   37 LVOT Area:     3.14 cm  LV Volumes (MOD) LV vol d, MOD A2C: 84.4 ml LV vol d, MOD A4C: 95.3 ml LV vol s, MOD A2C: 58.3 ml LV vol s, MOD A4C: 74.3 ml LV SV MOD A2C:     26.1 ml LV SV MOD A4C:     95.3 ml LV SV MOD BP:      27.1 ml RIGHT VENTRICLE            IVC RV S prime:     6.68 cm/s  IVC diam: 1.90 cm TAPSE (M-mode): 1.1 cm LEFT ATRIUM           Index        RIGHT ATRIUM           Index LA diam:      3.60 cm 1.90 cm/m   RA Area:     11.40 cm LA Vol (A2C): 31.2 ml 16.49 ml/m  RA Volume:   23.60 ml  12.48 ml/m LA Vol (A4C): 25.6 ml 13.53 ml/m  AORTIC VALVE AV Area (Vmax):    2.06 cm AV Area (Vmean):   1.99 cm AV Area (VTI):     1.74 cm AV Vmax:           209.40 cm/s AV Vmean:          146.200 cm/s AV VTI:            0.400 m AV Peak Grad:      17.5 mmHg AV Mean Grad:      10.0 mmHg LVOT Vmax:         137.00 cm/s LVOT Vmean:        92.700 cm/s LVOT VTI:          0.221 m LVOT/AV VTI ratio: 0.55 AI PHT:            358 msec  AORTA Ao Root diam: 3.20 cm Ao Asc diam:  3.30 cm MITRAL VALVE               TRICUSPID VALVE MV Area (PHT): 4.52 cm    TR Peak grad:   22.1 mmHg MV Decel Time: 168 msec    TR Vmax:  235.00 cm/s MV E velocity: 67.83 cm/s MV A velocity: 73.13 cm/s  SHUNTS MV E/A ratio:  0.93        Systemic VTI:  0.22 m                            Systemic Diam: 2.00 cm Jenkins Rouge MD Electronically signed by Jenkins Rouge MD Signature Date/Time: 05/20/2021/4:39:12 PM    Final     Cardiac Studies   ECHO : EF 25% suggesting LAD territory infarction.  Patient Profile     66 y.o. male a hx of DM2 and distant prior tobacco use (~15 py, 40 years ago) presented for acute onset SOB/DOE and palpitations and found to have elevated troponin and Afib RVR.   Assessment & Plan    Non-ST elevation myocardial infarction: Severe diffuse underlying CAD with 100% RCA, and diffuse LAD and Cfx disease that is not graftable.. DC IV NTG. Acute on chronic systolic  heart failure: Wedge and EDP < 10 mmHg. Stop IV NTG. Gently hydrate. Atrial fibrillation with rapid ventricular response: add digoxin Diabetes mellitus type II: SGLT-2 will be added. Severe hypothyroidism: TSH > 100. Replacement will need to be very slow to prevent unstable angina.     For questions or updates, please contact [place] Please consult [email or web address] for contact info under        Signed, Sinclair Grooms, MD  05/21/2021, 5:26 PM

## 2021-05-21 NOTE — TOC Initial Note (Signed)
Transition of Care [facility]) - Initial/Assessment Note    Patient Details  Name: Antonio Cervantes MRN: [id number] Date of Birth: [birth year]  [facility] CM/SW Contact:    Harriet Masson, RN Phone Number: 05/21/2021, 3:17 PM  Clinical Narrative:      Spoke to patient regarding no PCP.  Patient would like RNCM to make apt for PCP in [place]. I have sent a request for [facility] assistant to make a PCP apt for this patient.  He lives and helps take care of his 67 year old Mother.    He has someone that can help him get to apts.        Patient Goals and CMS Choice        Expected Discharge Plan and Services                                                Prior Living Arrangements/Services                       Activities of Daily Living      Permission Sought/Granted                  Emotional Assessment              Admission diagnosis:  Flash pulmonary edema (HCC) [J81.0] Acute respiratory failure with hypoxia (HCC) [J96.01] Atrial fibrillation, unspecified type (HCC) [I48.91] Acute congestive heart failure, unspecified heart failure type (HCC) [I50.9] Patient Active Problem List   Diagnosis Date Noted   Flash pulmonary edema (HCC) 05/19/2021   Acute respiratory failure with hypoxia (HCC) 05/19/2021   Diabetic peripheral neuropathy associated with type 2 diabetes mellitus (HCC) 05/19/2021   AKI (acute kidney injury) (HCC) 05/19/2021   Unspecified atrial fibrillation (HCC) 05/19/2021   DM (diabetes mellitus), type 2 (HCC) 05/19/2021   PCP:  Pcp, No Pharmacy:   [id number] [place], [place] - [address] AT [facility] [address] [place] [address] Phone: [phone] Fax: [phone]     Social Determinants of Health (SDOH) Interventions Food Insecurity Interventions: Intervention Not Indicated Financial Strain Interventions: Intervention Not Indicated Housing Interventions:  Intervention Not Indicated Transportation Interventions: Intervention Not Indicated  Readmission Risk Interventions No flowsheet data found.

## 2021-05-21 NOTE — Progress Notes (Signed)
Pt said he didn't want to wear CPAP tonight. Says he didn't like to wear the mask at night. Rt will continue to monitor as needed.

## 2021-05-21 NOTE — Progress Notes (Incomplete)
Heart Failure Stewardship Pharmacist Progress Note   PCP: Pcp, No PCP-Cardiologist: None    HPI:  ***  Current HF Medications: {HF MEDS CURRENT:26665}  Prior to admission HF Medications: {HF MEDS PTA:26664}  Pertinent Lab Values: Serum creatinine ***, BUN ***, Potassium ***, Sodium ***, BNP ***, Magnesium ***, A1c ***, Digoxin ***   Vital Signs: Weight: *** lbs (admission weight: *** lbs) Blood pressure: ***  Heart rate: ***  I/O: -***L yesterday; net -***L ReDS reading: ***  Medication Assistance / Insurance Benefits Check: Does the patient have prescription insurance?  {Yes/No/Pending:24180} Type of insurance plan: ***  Does the patient qualify for medication assistance through manufacturers or grants?   {Yes/No/Pending:24180} Eligible grants and/or patient assistance programs: *** Medication assistance applications in progress: ***  Medication assistance applications approved: *** Approved medication assistance renewals will be completed by: ***  Outpatient Pharmacy:  Prior to admission outpatient pharmacy: *** Is the patient willing to use [facility] TOC pharmacy at discharge? {Yes/No/Pending:24180} Is the patient willing to transition their outpatient pharmacy to utilize a [facility] outpatient pharmacy?   {Yes/No/Pending:24180}    Assessment: 1. ***Acute on chronic systolic CHF (EF ***), due to ***. NYHA class *** symptoms. -    Plan: 1) Medication changes recommended at this time: -  2) Patient assistance: -  3)  Education  - To be completed prior to discharge  Sharen Hones, PharmD, BCPS Heart Failure Stewardship Pharmacist Phone [phone]

## 2021-05-21 NOTE — Progress Notes (Signed)
PROGRESS NOTE  EDDIE ILACQUA  [id number] DOB: 1956-03-31 DOA: 05/19/2021 PCP: Merryl Hacker, No  Brief Narrative: OSHANE BORUNDA is a 66 y.o. male with a history of T2DM and hypothyroidism no longer on medications, and gout who presented to the ED 1/22 with 1-2 months of progressive exertional dyspnea and abrupt onset of chest burning and dyspnea after dinner. EMS was called, found the patient to be hypoxic to 86%, tachycardic, pale and diaphoretic. NIPPV was placed and code STEMI called, though later cancelled based on ECG on arrival which showed no ST elevation, but suggested atrial fibrillation. CXR revealed pulmonary edema and the patient improved significantly with IV lasix. Initial troponin was 17, increased to 1,188 at time of admission. Cardiology consulted, IV heparin initiated. Cardiac catheterization is planned and NTG gtt is continued. Thyroid studies confirm severe, untreated hypothyroidism for which synthroid is restarted.   Assessment & Plan: Principal Problem:   Flash pulmonary edema (HCC) Active Problems:   Acute respiratory failure with hypoxia (HCC)   Diabetic peripheral neuropathy associated with type 2 diabetes mellitus (HCC)   AKI (acute kidney injury) ([place])   Unspecified atrial fibrillation (HCC)   DM (diabetes mellitus), type 2 (HCC)  Acute respiratory failure with hypoxia due to flash pulmonary edema: Improving.  - Continue supplemental oxygen as needed to maintain adequate oxygenation.  - Treat conditions below  NSTEMI: Troponin up to 8k this AM. Based on wall motion abnormalities, LAD infarct suspected. - Heparin and NTG gtt - ASA 81mg  - LHC/RHC tentatively planned 1/24  Acute CHF: Echo reveals LVEF 20-25%.   - Continue lasix 40mg  IV BID, monitoring I/O, weights.  - Will initiate GDMT as tolerated.   Possible newly diagnosed PAF: Most recent ECG shows sinus vs. ectopic atrial rhythm.  - Anticoagulation as above for NSTEMI, will discuss with cardiology after  cath.  - Rate is currently controlled. Consider coreg vs. metop succinate.  Severe untreated hypothyroidism: Pt stopped taking synthroid >1 year ago. TSH 109, free T4 0.46. No myxedema noted, though does confirm cold intolerance, brittle nails on ROS.  - Start IV synthroid currently. Given his active ACS, AFib and chronic nature of TFT derangement, will start at 28mcg (36mcg po equivalent) with plans to raise dose as tolerated.   T2DM: HbA1c 7.3%.  - Will need to initiate therapy and reestablish PCP care going forward.  - Continue SSI here  HLD: LDL 148, HDL 25 - Started atorvastatin  Gout: Quiescent currently.  - Monitor clinically with diuresis.   AKI: Putative diagnosis to be specified with trend. Element of CKD to be expected with untreated DM.  Leukocytosis: Resolved without antimicrobial Tx. Afebrile, no consolidation on CXR. Covid and flu negative.  - Monitor off abx for now.   Hyponatremia: Worsened with diuresis.  - Continue monitoring  Suspected PAD: Diminished peripheral pulses without evidence of critical limb ischemia.  - ASA, statin as above, suggest vascular surgery follow up.  DVT prophylaxis: IV heparin Code Status: Full Family Communication: None at bedside this AM Disposition Plan:  Status is: Inpatient  Remains inpatient appropriate because: NSTEMI, respiratory failure  Consultants:  Cardiology  Procedures:  LHC/RHC planned  Antimicrobials: None   Subjective: Feels his breathing is much better, denies chest pain. Forgot to mention that he had hypothyroidism and hadn't taken any medications in > 1 year, but confirms this now. Denies hair falling out (that is recent), skin texture changes, leg swelling, eye/facial changes, constipation, lethargy, syncope, palpitations. Does have worsening cold intolerance.  Objective: Vitals:   05/21/21 0400 05/21/21 0529 05/21/21 0742 05/21/21 0841  BP: (!) 92/58  (!) 125/94   Pulse: 63 88 74   Resp: 10 13 14     Temp:   98.3 F (36.8 C)   TempSrc:   Oral   SpO2: 99% 96% 99% 99%  Weight:      Height:        Intake/Output Summary (Last 24 hours) at 05/21/2021 1101 Last data filed at 05/21/2021 1027 Gross per 24 hour  Intake 50 ml  Output 1850 ml  Net -1800 ml   Filed Weights   05/19/21 1850  Weight: 81.6 kg   Gen: 66 y.o. male in no distress Pulm: Nonlabored breathing. Clear. CV: Regular rate and rhythm. No murmur, rub, or gallop. No JVD, no pitting dependent edema. Stable diminished peripheral pulses. GI: Abdomen soft, non-tender, non-distended, with normoactive bowel sounds.  Ext: Warm, no deformities. Multiple chipped fingernails. Some cupped. No myxedema.  Skin: No new rashes. Feet, lower left leg with eschars scattered and healed small wounds where fire ants bit him. No chronic wounds. Neuro: Alert and oriented. No focal neurological deficits. Psych: Judgement and insight appear marginal. Mood euthymic & affect congruent. Behavior is appropriate.    Data Reviewed: I have personally reviewed following labs and imaging studies  CBC: Recent Labs  Lab 05/19/21 1832 05/19/21 1847 05/20/21 0340 05/20/21 1420 05/21/21 0101  WBC 13.2*  --  9.9 9.5 9.8  NEUTROABS 7.4  --   --  6.5  --   HGB 13.4 14.6 12.0* 11.3* 11.7*  HCT 42.3 43.0 35.9* 34.1* 35.2*  MCV 95.5  --  90.9 92.4 90.5  PLT 285  --  257 242 [id number]   Basic Metabolic Panel: Recent Labs  Lab 05/19/21 1832 05/19/21 1847 05/20/21 0340 05/21/21 0101  NA 130* 133* 134* 130*  K 4.4 4.6 4.5 4.2  CL 103 103 105 99  CO2 18*  --  22 20*  GLUCOSE 283* 274* 118* 104*  BUN 15 16 18 21   CREATININE 1.40* 1.30* 1.48* 1.60*  CALCIUM 8.4*  --  8.3* 8.6*  MG  --   --  1.7  --    GFR: Estimated Creatinine Clearance: 44.6 mL/min (A) (by C-G formula based on SCr of 1.6 mg/dL (H)). Liver Function Tests: Recent Labs  Lab 05/19/21 1832 05/20/21 0340  AST 16 52*  ALT 11 14  ALKPHOS 50 44  BILITOT 0.5 0.7  PROT 6.9 6.2*   ALBUMIN 3.5 3.2*   No results for input(s): LIPASE, AMYLASE in the last 168 hours. No results for input(s): AMMONIA in the last 168 hours. Coagulation Profile: Recent Labs  Lab 05/19/21 1900  INR 1.1   Cardiac Enzymes: No results for input(s): CKTOTAL, CKMB, CKMBINDEX, TROPONINI in the last 168 hours. BNP (last 3 results) No results for input(s): PROBNP in the last 8760 hours. HbA1C: Recent Labs    05/20/21 1420  HGBA1C 7.3*   CBG: Recent Labs  Lab 05/19/21 1842 05/20/21 1240 05/20/21 1720 05/20/21 2116 05/21/21 0741  GLUCAP 184* 117* 127* 98 110*   Lipid Profile: Recent Labs    05/19/21 1832  CHOL 204*  HDL 25*  LDLCALC 148*  TRIG 154*  CHOLHDL 8.2   Thyroid Function Tests: Recent Labs    05/20/21 2231  TSH 108.608*  FREET4 0.46*   Anemia Panel: No results for input(s): VITAMINB12, FOLATE, FERRITIN, TIBC, IRON, RETICCTPCT in the last 72 hours. Urine analysis:    Component  Value Date/Time   COLORURINE YELLOW 07/14/2016 [place] 07/14/2016 0551   LABSPEC 1.010 07/14/2016 0551   PHURINE 5.5 07/14/2016 0551   GLUCOSEU >=500 (A) 07/14/2016 0551   HGBUR NEGATIVE 07/14/2016 0551   BILIRUBINUR NEGATIVE 07/14/2016 0551   KETONESUR 15 (A) 07/14/2016 0551   PROTEINUR NEGATIVE 07/14/2016 0551   NITRITE NEGATIVE 07/14/2016 0551   LEUKOCYTESUR NEGATIVE 07/14/2016 0551   Recent Results (from the past 240 hour(s))  Resp Panel by RT-PCR (Flu A&B, Covid) Nasopharyngeal Swab     Status: None   Collection Time: 05/19/21  6:34 PM   Specimen: Nasopharyngeal Swab; Nasopharyngeal(NP) swabs in vial transport medium  Result Value Ref Range Status   SARS Coronavirus 2 by RT PCR NEGATIVE NEGATIVE Final    Comment: (NOTE) SARS-CoV-2 target nucleic acids are NOT DETECTED.  The SARS-CoV-2 RNA is generally detectable in upper respiratory specimens during the acute phase of infection. The lowest concentration of SARS-CoV-2 viral copies this assay can  detect is 138 copies/mL. A negative result does not preclude SARS-Cov-2 infection and should not be used as the sole basis for treatment or other patient management decisions. A negative result may occur with  improper specimen collection/handling, submission of specimen other than nasopharyngeal swab, presence of viral mutation(s) within the areas targeted by this assay, and inadequate number of viral copies(<138 copies/mL). A negative result must be combined with clinical observations, patient history, and epidemiological information. The expected result is Negative.  Fact Sheet for Patients:  EntrepreneurPulse.com.au  Fact Sheet for Healthcare Providers:  IncredibleEmployment.be  This test is no t yet approved or cleared by the [place] FDA and  has been authorized for detection and/or diagnosis of SARS-CoV-2 by FDA under an Emergency Use Authorization (EUA). This EUA will remain  in effect (meaning this test can be used) for the duration of the COVID-19 declaration under Section 564(b)(1) of the Act, 21 U.S.C.section 360bbb-3(b)(1), unless the authorization is terminated  or revoked sooner.       Influenza A by PCR NEGATIVE NEGATIVE Final   Influenza B by PCR NEGATIVE NEGATIVE Final    Comment: (NOTE) The Xpert Xpress SARS-CoV-2/FLU/RSV plus assay is intended as an aid in the diagnosis of influenza from Nasopharyngeal swab specimens and should not be used as a sole basis for treatment. Nasal washings and aspirates are unacceptable for Xpert Xpress SARS-CoV-2/FLU/RSV testing.  Fact Sheet for Patients: EntrepreneurPulse.com.au  Fact Sheet for Healthcare Providers: IncredibleEmployment.be  This test is not yet approved or cleared by the [place] FDA and has been authorized for detection and/or diagnosis of SARS-CoV-2 by FDA under an Emergency Use Authorization (EUA). This EUA will remain in  effect (meaning this test can be used) for the duration of the COVID-19 declaration under Section 564(b)(1) of the Act, 21 U.S.C. section 360bbb-3(b)(1), unless the authorization is terminated or revoked.  Performed at [facility], [place] [address], [place], [place] [address]       Radiology Studies: DG Chest Portable 1 View  Result Date: 05/19/2021 CLINICAL DATA:  Shortness of breath code STEMI EXAM: PORTABLE CHEST 1 VIEW COMPARISON:  09/16/2017 FINDINGS: Diffuse interstitial opacity, suspicious for edema. No pleural effusion. Borderline cardiomegaly. No pneumothorax IMPRESSION: Moderate diffuse interstitial opacity is suspicious for pulmonary edema. Electronically Signed   By: Donavan Foil M.D.   On: 05/19/2021 18:52   ECHOCARDIOGRAM COMPLETE  Result Date: 05/20/2021    ECHOCARDIOGRAM REPORT   Patient Name:   CASHTON KUSTRA Date of  Exam: 05/20/2021 Medical Rec #:  [id number]         Height:       65.0 in Accession #:    [id number]        Weight:       180.0 lb Date of Birth:  17-Aug-1955        BSA:          1.892 m Patient Age:    48 years          BP:           113/81 mmHg Patient Gender: M                 HR:           78 bpm. Exam Location:  Inpatient Procedure: 2D Echo, 3D Echo, Cardiac Doppler and Color Doppler REPORT CONTAINS CRITICAL RESULT Indications:    I50.40* Unspecified combined systolic (congestive) and diastolic                 (congestive) heart failure  History:        Patient has no prior history of Echocardiogram examinations.                 Abnormal ECG, Arrythmias:Atrial Fibrillation,                 Signs/Symptoms:Shortness of Breath and Dyspnea; Risk                 Factors:Diabetes. Pulmoary edema.  Sonographer:    Roseanna Rainbow RDCS Referring Phys: [place]  1. Septal apical mid/distal anterior wall and inferior apex akinetic suggesting LAD infarct. Left ventricular ejection fraction, by estimation, is 20 to 25%. The left ventricle has severely  decreased function. The left ventricle demonstrates regional wall motion abnormalities (see scoring diagram/findings for description). The left ventricular internal cavity size was severely dilated. There is mild left ventricular hypertrophy. Left ventricular diastolic parameters were normal.  2. Right ventricular systolic function is normal. The right ventricular size is normal. There is normal pulmonary artery systolic pressure.  3. The mitral valve is normal in structure. Mild mitral valve regurgitation. No evidence of mitral stenosis.  4. Degree of AS hard to judge due to severe decrease in LV function DVI 0.55 AVA 2 cm2 but morphology and gradients suggest AS could be moderate . The aortic valve is normal in structure. There is moderate calcification of the aortic valve. There is moderate thickening of the aortic valve. Aortic valve regurgitation is mild. Aortic valve sclerosis/calcification is present, without any evidence of aortic stenosis.  5. The inferior vena cava is normal in size with greater than 50% respiratory variability, suggesting right atrial pressure of 3 mmHg. FINDINGS  Left Ventricle: Septal apical mid/distal anterior wall and inferior apex akinetic suggesting LAD infarct. Left ventricular ejection fraction, by estimation, is 20 to 25%. The left ventricle has severely decreased function. The left ventricle demonstrates regional wall motion abnormalities. The left ventricular internal cavity size was severely dilated. There is mild left ventricular hypertrophy. Left ventricular diastolic parameters were normal. Right Ventricle: The right ventricular size is normal. No increase in right ventricular wall thickness. Right ventricular systolic function is normal. There is normal pulmonary artery systolic pressure. The tricuspid regurgitant velocity is 2.35 m/s, and  with an assumed right atrial pressure of 3 mmHg, the estimated right ventricular systolic pressure is [id number] mmHg. Left Atrium: Left  atrial size was normal in size. Right Atrium: Right atrial size  was normal in size. Pericardium: There is no evidence of pericardial effusion. Mitral Valve: The mitral valve is normal in structure. Mild mitral valve regurgitation. No evidence of mitral valve stenosis. Tricuspid Valve: The tricuspid valve is normal in structure. Tricuspid valve regurgitation is not demonstrated. No evidence of tricuspid stenosis. Aortic Valve: Degree of AS hard to judge due to severe decrease in LV function DVI 0.55 AVA 2 cm2 but morphology and gradients suggest AS could be moderate. The aortic valve is normal in structure. There is moderate calcification of the aortic valve. There is moderate thickening of the aortic valve. Aortic valve regurgitation is mild. Aortic regurgitation PHT measures 358 msec. Aortic valve sclerosis/calcification is present, without any evidence of aortic stenosis. Aortic valve mean gradient measures 10.0 mmHg. Aortic valve peak gradient measures 17.5 mmHg. Aortic valve area, by VTI measures 1.74 cm. Pulmonic Valve: The pulmonic valve was normal in structure. Pulmonic valve regurgitation is not visualized. No evidence of pulmonic stenosis. Aorta: The aortic root is normal in size and structure. Venous: The inferior vena cava is normal in size with greater than 50% respiratory variability, suggesting right atrial pressure of 3 mmHg. IAS/Shunts: No atrial level shunt detected by color flow Doppler.  LEFT VENTRICLE PLAX 2D LVIDd:         5.10 cm     Diastology LVIDs:         4.30 cm     LV e' medial:    4.88 cm/s LV PW:         1.00 cm     LV E/e' medial:  13.9 LV IVS:        1.10 cm     LV e' lateral:   7.13 cm/s LVOT diam:     2.00 cm     LV E/e' lateral: 9.5 LV SV:         69 LV SV Index:   37 LVOT Area:     3.14 cm  LV Volumes (MOD) LV vol d, MOD A2C: 84.4 ml LV vol d, MOD A4C: 95.3 ml LV vol s, MOD A2C: 58.3 ml LV vol s, MOD A4C: 74.3 ml LV SV MOD A2C:     26.1 ml LV SV MOD A4C:     95.3 ml LV SV MOD  BP:      27.1 ml RIGHT VENTRICLE            IVC RV S prime:     6.68 cm/s  IVC diam: 1.90 cm TAPSE (M-mode): 1.1 cm LEFT ATRIUM           Index        RIGHT ATRIUM           Index LA diam:      3.60 cm 1.90 cm/m   RA Area:     11.40 cm LA Vol (A2C): 31.2 ml 16.49 ml/m  RA Volume:   23.60 ml  12.48 ml/m LA Vol (A4C): 25.6 ml 13.53 ml/m  AORTIC VALVE AV Area (Vmax):    2.06 cm AV Area (Vmean):   1.99 cm AV Area (VTI):     1.74 cm AV Vmax:           209.40 cm/s AV Vmean:          146.200 cm/s AV VTI:            0.400 m AV Peak Grad:      17.5 mmHg AV Mean Grad:      10.0 mmHg  LVOT Vmax:         137.00 cm/s LVOT Vmean:        92.700 cm/s LVOT VTI:          0.221 m LVOT/AV VTI ratio: 0.55 AI PHT:            358 msec  AORTA Ao Root diam: 3.20 cm Ao Asc diam:  3.30 cm MITRAL VALVE               TRICUSPID VALVE MV Area (PHT): 4.52 cm    TR Peak grad:   22.1 mmHg MV Decel Time: 168 msec    TR Vmax:        235.00 cm/s MV E velocity: 67.83 cm/s MV A velocity: 73.13 cm/s  SHUNTS MV E/A ratio:  0.93        Systemic VTI:  0.22 m                            Systemic Diam: 2.00 cm Jenkins Rouge MD Electronically signed by Jenkins Rouge MD Signature Date/Time: 05/20/2021/4:39:12 PM    Final     Scheduled Meds:  aspirin  81 mg Oral Pre-Cath   aspirin EC  81 mg Oral Daily   atorvastatin  80 mg Oral Daily   insulin aspart  0-15 Units Subcutaneous TID WC   insulin aspart  0-5 Units Subcutaneous QHS   levothyroxine  25 mcg Intravenous Daily   sodium chloride flush  3 mL Intravenous Q12H   Continuous Infusions:  sodium chloride     sodium chloride 10 mL/hr at 05/21/21 [id number]   sodium chloride     Followed by   sodium chloride     heparin 1,250 Units/hr (05/20/21 1634)   nitroGLYCERIN 10 mcg/min (05/20/21 0912)     LOS: 2 days     Patrecia Pour, MD [facility] [email or web address] 05/21/2021, 11:01 AM

## 2021-05-21 NOTE — Progress Notes (Signed)
ANTICOAGULATION CONSULT NOTE - Follow Up Consult  Pharmacy Consult for heparin Indication:  NSTEMI  Labs: Recent Labs    05/19/21 1832 05/19/21 1847 05/19/21 1900 05/19/21 2032 05/20/21 0340 05/20/21 0655 05/20/21 1420 05/20/21 2231  HGB 13.4 14.6  --   --  12.0*  --  11.3*  --   HCT 42.3 43.0  --   --  35.9*  --  34.1*  --   PLT 285  --   --   --  257  --  242  --   APTT  --   --  29  --   --   --   --   --   LABPROT  --   --  14.2  --   --   --   --   --   INR  --   --  1.1  --   --   --   --   --   HEPARINUNFRC  --   --   --   --   --  0.14* 0.33 0.34  CREATININE 1.40* 1.30*  --   --  1.48*  --   --   --   TROPONINIHS 17  --   --  1,188*  --   --   --   --     Assessment/Plan:  66yo male therapeutic on heparin after rate change. Will continue infusion at current rate of 1250 units/hr and monitor daily level.   Vernard Gambles, PharmD, BCPS  05/21/2021,12:07 AM

## 2021-05-21 NOTE — Progress Notes (Signed)
Heart Failure Nurse Navigator Progress Note  PCP: Pcp, No PCP-Cardiologist: none (new) Admission Diagnosis: NSTEMI, flash pulmonary edema. NEW CHF (HFrEF) Admitted from: home with mother  Presentation:   Antonio Cervantes presented 1/23 with increased SOB and chest burning. Pt resting in bed, HOB 30 on room air. Patient interactive with interview process. 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 Pt states mother dress and bathes herself and cooks for both of them. Pt states his bother or son are staying with mother at night while the patient is hospitalized.  Pt states he drives a reliable vehicle. Pt is retired, on fixed income. States only medication prior to admission is for gout flares, which he hasn't had to use often. Briefly explained plans to change medication regimen to help increase heart strength. Educated pt on diet and fluid modifications. Pt states he does not have PCP--consulted CSW/RNCM to assist. Lives in [place], [place]. Willing to drive to [facility] for [place] clinic. Explained benefits of Heart & Vascular Transitions of Care Clinic appointment, patient agreeable.    ECHO/ LVEF: 20-25%, mild LVH  Clinical Course:  Past Medical History:  Diagnosis Date   Diabetes mellitus without complication ([place])    Diabetic peripheral neuropathy associated with type 2 diabetes mellitus ([place])      Social History   Socioeconomic History   Marital status: Married    Spouse name: Not on file   Number of children: 1   Years of education: Not on file   Highest education level: 10th grade  Occupational History   Occupation: retired  Tobacco Use   Smoking status: Former    Packs/day: 1.50    Years: 15.00    Pack years: 22.50    Types: Cigarettes    Quit date: 1983    Years since quitting: 40.0   Smokeless tobacco: Never  Vaping Use   Vaping Use: Never used  Substance and Sexual Activity   Alcohol use: No   Drug use: No   Sexual activity: Not on file   Other Topics Concern   Not on file  Social History Narrative   Not on file   Social Determinants of Health   Financial Resource Strain: Low Risk    Difficulty of Paying Living Expenses: Not very hard  Food Insecurity: No Food Insecurity   Worried About Charity fundraiser in the Last Year: Never true   [place] in the Last Year: Never true  Transportation Needs: No Transportation Needs   Lack of Transportation (Medical): No   Lack of Transportation (Non-Medical): No  Physical Activity: Not on file  Stress: Not on file  Social Connections: Not on file    High Risk Criteria for Readmission and/or Poor Patient Outcomes: Heart failure hospital admissions (last 6 months): 1  No Show rate: NA Difficult social situation: no Demonstrates medication adherence: NA Primary Language: English Literacy level: able to read/write and comprehend. Wears reading glasses.  Education Assessment and Provision:  Detailed education and instructions provided on heart failure disease management including the following:  Signs and symptoms of Heart Failure When to call the physician Importance of daily weights Low sodium diet Fluid restriction Medication management Anticipated future follow-up appointments  Patient education given on each of the above topics.  Patient acknowledges understanding via teach back method and acceptance of all instructions.  Education Materials:  "Living Better With Heart Failure" Booklet, HF zone tool, & Daily Weight Tracker Tool.  Patient has scale at home: yes Patient has pill box at home: no, pt considering   Barriers of Care:   -new dx -dietary indiscretions  Considerations/Referrals:   Referral made to Heart Failure Pharmacist Stewardship: yes, appreciated Referral made to Heart Failure CSW/NCM TOC: yes, appreciated (PCP, cardiology around [place], [place]) Referral made to Heart & Vascular TOC clinic: yes, Friday 2/3 @ 11AM  Items for Follow-up on  DC/TOC: -optimize -HF education -dietary modification -cardiology -PCP    Pricilla Holm, MSN, RN Heart Failure Nurse Navigator [phone]

## 2021-05-21 NOTE — Progress Notes (Signed)
ANTICOAGULATION CONSULT NOTE  Pharmacy Consult for heparin Indication: chest pain/ACS  No Known Allergies  Patient Measurements: Height: 5\' 5"  (165.1 cm) Weight: 81.6 kg (180 lb) IBW/kg (Calculated) : 61.5 Heparin Dosing Weight: 78 kg   Vital Signs: Temp: 98.3 F (36.8 C) (01/24 0742) Temp Source: Oral (01/24 0742) BP: 125/94 (01/24 0742) Pulse Rate: 74 (01/24 0742)  Labs: Recent Labs    05/19/21 1832 05/19/21 1847 05/19/21 1900 05/19/21 2032 05/20/21 0340 05/20/21 0655 05/20/21 1420 05/20/21 2231 05/21/21 0101  HGB 13.4 14.6  --   --  12.0*  --  11.3*  --  11.7*  HCT 42.3 43.0  --   --  35.9*  --  34.1*  --  35.2*  PLT 285  --   --   --  257  --  242  --  239  APTT  --   --  29  --   --   --   --   --   --   LABPROT  --   --  14.2  --   --   --   --   --   --   INR  --   --  1.1  --   --   --   --   --   --   HEPARINUNFRC  --   --   --   --   --    < > 0.33 0.34 0.38  CREATININE 1.40* 1.30*  --   --  1.48*  --   --   --  1.60*  TROPONINIHS 17  --   --  1,188*  --   --   --   --  8,787*   < > = values in this interval not displayed.     Estimated Creatinine Clearance: 44.6 mL/min (A) (by C-G formula based on SCr of 1.6 mg/dL (H)).   Medical History: Past Medical History:  Diagnosis Date   Diabetes mellitus without complication ([place])    Diabetic peripheral neuropathy associated with type 2 diabetes mellitus ([facility])     Medications:  Medications Prior to Admission  Medication Sig Dispense Refill Last Dose   indomethacin (INDOCIN) 25 MG capsule Take 25 mg by mouth 2 (two) times daily as needed (gout flares).   05/18/2021   acetaminophen (TYLENOL) 325 MG tablet Take 2 tablets (650 mg total) by mouth every 6 (six) hours as needed. (Patient not taking: Reported on 05/19/2021) 30 tablet 0 Not Taking   ibuprofen (ADVIL,MOTRIN) 200 MG tablet Take 2 tablets (400 mg total) by mouth every 6 (six) hours as needed (for pain). (Patient not taking: Reported on 05/19/2021) 30  tablet 0 Not Taking   lidocaine (XYLOCAINE) 5 % ointment Apply 1 application topically as needed. (Patient not taking: Reported on 05/19/2021) 35.44 g 0 Not Taking   metFORMIN (GLUCOPHAGE) 500 MG tablet Take 1 tablet (500 mg total) by mouth 2 (two) times daily with a meal. (Patient not taking: Reported on 05/19/2021) 60 tablet 0 Not Taking   naproxen (NAPROSYN) 500 MG tablet Take 500 mg by mouth 2 (two) times daily with a meal. (Patient not taking: Reported on 05/19/2021)   Not Taking    Assessment: 33 YOM who presents with respiratory distress and chest burning. Pharmacy consulted to start IV heparin.   Heparin level 0.38 on check this morning, at goal. Hgb stable 11s, platelets within normal limits. No signs of bleeding noted.   Goal of Therapy:  Heparin level  0.3-0.7 units/ml Monitor platelets by anticoagulation protocol: Yes   Plan:  Continue IV heparin gtt at 1250 units/hr Daily heparin level, CBC Monitor for signs/symptoms of bleeding  Thank you for involving pharmacy in this patient's care.  Erin Hearing PharmD., BCPS Clinical Pharmacist 05/21/2021 10:31 AM

## 2021-05-22 ENCOUNTER — Encounter (HOSPITAL_COMMUNITY): Payer: Self-pay | Admitting: Interventional Cardiology

## 2021-05-22 DIAGNOSIS — N179 Acute kidney failure, unspecified: Secondary | ICD-10-CM

## 2021-05-22 LAB — COMPREHENSIVE METABOLIC PANEL
ALT: 12 U/L (ref 0–44)
AST: 30 U/L (ref 15–41)
Albumin: 3.3 g/dL — ABNORMAL LOW (ref 3.5–5.0)
Alkaline Phosphatase: 51 U/L (ref 38–126)
Anion gap: 8 (ref 5–15)
BUN: 20 mg/dL (ref 8–23)
CO2: 23 mmol/L (ref 22–32)
Calcium: 8.9 mg/dL (ref 8.9–10.3)
Chloride: 99 mmol/L (ref 98–111)
Creatinine, Ser: 1.58 mg/dL — ABNORMAL HIGH (ref 0.61–1.24)
GFR, Estimated: 48 mL/min — ABNORMAL LOW (ref >60)
Glucose, Bld: 93 mg/dL (ref 70–99)
Potassium: 4.2 mmol/L (ref 3.5–5.1)
Sodium: 130 mmol/L — ABNORMAL LOW (ref 135–145)
Total Bilirubin: 0.6 mg/dL (ref 0.3–1.2)
Total Protein: 6.6 g/dL (ref 6.5–8.1)

## 2021-05-22 LAB — CBC
HCT: 35.4 % — ABNORMAL LOW (ref 39.0–52.0)
Hemoglobin: 12.5 g/dL — ABNORMAL LOW (ref 13.0–17.0)
MCH: 31.6 pg (ref 26.0–34.0)
MCHC: 35.3 g/dL (ref 30.0–36.0)
MCV: 89.6 fL (ref 80.0–100.0)
Platelets: 261 10*3/uL (ref 150–400)
RBC: 3.95 MIL/uL — ABNORMAL LOW (ref 4.22–5.81)
RDW: 13.1 % (ref 11.5–15.5)
WBC: 9.9 10*3/uL (ref 4.0–10.5)
nRBC: 0 % (ref 0.0–0.2)

## 2021-05-22 LAB — T3, FREE: T3, Free: 1.9 pg/mL — ABNORMAL LOW (ref 2.0–4.4)

## 2021-05-22 LAB — GLUCOSE, CAPILLARY
Glucose-Capillary: 94 mg/dL (ref 70–99)
Glucose-Capillary: 117 mg/dL — ABNORMAL HIGH (ref 70–99)
Glucose-Capillary: 108 mg/dL — ABNORMAL HIGH (ref 70–99)
Glucose-Capillary: 160 mg/dL — ABNORMAL HIGH (ref 70–99)

## 2021-05-22 LAB — MAGNESIUM: Magnesium: 1.7 mg/dL (ref 1.7–2.4)

## 2021-05-22 MED ORDER — LEVOTHYROXINE SODIUM 75 MCG PO TABS
75.0000 ug | ORAL_TABLET | Freq: Every day | ORAL | Status: DC
  Administered 2021-05-22 – 2021-05-23 (×2): 75 ug via ORAL
  Filled 2021-05-22 (×2): qty 1

## 2021-05-22 MED ORDER — FLUTICASONE PROPIONATE 50 MCG/ACT NA SUSP
2.0000 | Freq: Every day | NASAL | Status: DC
  Administered 2021-05-22 – 2021-05-23 (×2): 2 via NASAL
  Filled 2021-05-22: qty 16

## 2021-05-22 MED ORDER — SALINE SPRAY 0.65 % NA SOLN
1.0000 | NASAL | Status: DC | PRN
  Filled 2021-05-22: qty 44

## 2021-05-22 MED ORDER — APIXABAN 5 MG PO TABS
5.0000 mg | ORAL_TABLET | Freq: Two times a day (BID) | ORAL | Status: DC
  Administered 2021-05-22 – 2021-05-23 (×3): 5 mg via ORAL
  Filled 2021-05-22 (×3): qty 1

## 2021-05-22 NOTE — Evaluation (Signed)
Occupational Therapy Evaluation Patient Details Name: Antonio Cervantes MRN: [id number] DOB: 11-Aug-1955 Today's Date: 05/22/2021   History of Present Illness Pt is a 66 y/o M presenting to ED on 1/22 with respiratory distress, 1-2 months of progressive exertional dyspnea. CXR significant for pulmonary edema, on IV lasix. Code STEMI called and later cancelled due to no ST elevation, found to have NSTEMI, A fib with RVR and increased troponins. PMH significant for DMII and tobacco use.   Clinical Impression   Pt reports independence at baseline with ADLs and mobility, lives with parent on the bottom floor/basement of 2-story home. Pt  currently min A - Supervision for ADLs, mod I for bed mobility, and min guard for transfers. Pt VSS throughout session, reports son may be coming to stay with pt at d/c to assist. Pt presenting with impairments listed below, recommend HHOT pending pt progress, may not need if son is able to stay with pt and assist.     Recommendations for follow up therapy are one component of a multi-disciplinary discharge planning process, led by the attending physician.  Recommendations may be updated based on patient status, additional functional criteria and insurance authorization.   Follow Up Recommendations  Home health OT (pending pt progress and if pt is able to get assist from son at home)    Assistance Recommended at Discharge Set up Supervision/Assistance  Patient can return home with the following A little help with bathing/dressing/bathroom;Help with stairs or ramp for entrance;Assistance with cooking/housework;A little help with walking and/or transfers    Functional Status Assessment  Patient has had a recent decline in their functional status and demonstrates the ability to make significant improvements in function in a reasonable and predictable amount of time.  Equipment Recommendations  Tub/shower seat    Recommendations for Other Services PT consult      Precautions / Restrictions Precautions Precautions: None Restrictions Weight Bearing Restrictions: No      Mobility Bed Mobility Overal bed mobility: Modified Independent             General bed mobility comments: able to sit EOB without external assist    Transfers Overall transfer level: Needs assistance Equipment used: None Transfers: Sit to/from Stand Sit to Stand: Min guard                  Balance                                           ADL either performed or assessed with clinical judgement   ADL Overall ADL's : Needs assistance/impaired Eating/Feeding: Independent;Sitting   Grooming: Independent;Sitting   Upper Body Bathing: Minimal assistance;Sitting   Lower Body Bathing: Minimal assistance;Sitting/lateral leans   Upper Body Dressing : Supervision/safety;Sitting   Lower Body Dressing: Supervision/safety;Sitting/lateral leans Lower Body Dressing Details (indicate cue type and reason): reaches down to pull up socks sitting EOB Toilet Transfer: Ambulation;Regular Toilet;Min guard Toilet Transfer Details (indicate cue type and reason): min guard for catheter mgmt Toileting- Clothing Manipulation and Hygiene: Supervision/safety;Sitting/lateral lean Toileting - Clothing Manipulation Details (indicate cue type and reason): able to manage clothing prior to sitting on commode     Functional mobility during ADLs: Min guard       Vision Baseline Vision/History: 1 Wears glasses Vision Assessment?: No apparent visual deficits     Perception     Praxis  Pertinent Vitals/Pain Pain Assessment Pain Assessment: No/denies pain     Hand Dominance Right   Extremity/Trunk Assessment Upper Extremity Assessment Upper Extremity Assessment: Overall WFL for tasks assessed   Lower Extremity Assessment Lower Extremity Assessment: Defer to PT evaluation       Communication Communication Communication: No difficulties    Cognition Arousal/Alertness: Awake/alert Behavior During Therapy: WFL for tasks assessed/performed Overall Cognitive Status: Within Functional Limits for tasks assessed                                       General Comments  VSS on RA    Exercises     Shoulder Instructions      Home Living Family/patient expects to be discharged to:: Private residence Living Arrangements: Parent Available Help at Discharge: Family Type of Home: House Home Access: Other (comment) (reports no steps to enter basement where he resides, has a few steps to enter house from main entrance and a flight if he goes upstairs)     Home Layout: Two level;Able to live on main level with bedroom/bathroom     Bathroom Shower/Tub: Tub/shower unit;Curtain   Firefighter: Standard     Home Equipment: None   Additional Comments: lives with mom in basement level, plans to ask son if he can stay with pt for a while      Prior Functioning/Environment Prior Level of Function : Independent/Modified Independent;Driving             Mobility Comments: no AD ADLs Comments: does IADLs        OT Problem List: Decreased strength;Decreased range of motion;Decreased activity tolerance;Impaired balance (sitting and/or standing);Decreased safety awareness      OT Treatment/Interventions: Self-care/ADL training;Therapeutic exercise;Energy conservation;DME and/or AE instruction;Therapeutic activities;Patient/family education;Balance training    OT Goals(Current goals can be found in the care plan section) Acute Rehab OT Goals Patient Stated Goal: none stated OT Goal Formulation: With patient Time For Goal Achievement: 06/05/21 Potential to Achieve Goals: Good ADL Goals Pt Will Perform Upper Body Dressing: with modified independence;sitting Pt Will Perform Lower Body Dressing: with modified independence;sit to/from stand Pt Will Transfer to Toilet: with modified independence;regular height  toilet;ambulating Pt Will Perform Tub/Shower Transfer: with modified independence;ambulating;shower seat  OT Frequency: Min 2X/week    Co-evaluation              AM-PAC OT "6 Clicks" Daily Activity     Outcome Measure Help from another person eating meals?: None Help from another person taking care of personal grooming?: None Help from another person toileting, which includes using toliet, bedpan, or urinal?: A Little Help from another person bathing (including washing, rinsing, drying)?: A Little Help from another person to put on and taking off regular upper body clothing?: A Little Help from another person to put on and taking off regular lower body clothing?: A Little 6 Click Score: 20   End of Session Nurse Communication: Mobility status;Other (comment) (RN reported no need for chair alarm)  Activity Tolerance: Patient tolerated treatment well Patient left: in chair;with call bell/phone within reach;with chair alarm set  OT Visit Diagnosis: Unsteadiness on feet (R26.81);Other abnormalities of gait and mobility (R26.89);Muscle weakness (generalized) (M62.81)                Time: 3235-5732 OT Time Calculation (min): 23 min Charges:  OT General Charges $OT Visit: 1 Visit OT Evaluation $OT Eval Low Complexity:  1 Low OT Treatments $Self Care/Home Management : 8-22 mins  Alfonzo Beers, OTD, OTR/L Acute Rehab [phone]   Mayer Masker 05/22/2021, 11:19 AM

## 2021-05-22 NOTE — Progress Notes (Signed)
CARDIAC REHAB PHASE I   PRE:  Rate/Rhythm: 75 sR    BP: sitting 110/64    SaO2:   MODE:  Ambulation: 280 ft   POST:  Rate/Rhythm: 109 ST    BP: sitting 109/74     SaO2: 95 RA  Pt slightly unsteady initially, contact guard. Steadier with distance but did c/o SOB and fatigue. VSS, in NSR. To bed after walk.   Discussed with pt MI, HF, daily wts, low sodium diet, DM, exercise, and CRPII. Pt receptive. Did not discuss NTG as he has never had CP. Will refer to [facility]. Left materials for pt to review. [phone]   Harriet Masson CES, ACSM 05/22/2021 1:30 PM

## 2021-05-22 NOTE — Plan of Care (Signed)

## 2021-05-22 NOTE — Progress Notes (Addendum)
The patient has been seen in conjunction with Reino Bellis, NP. All aspects of care have been considered and discussed. The patient has been personally interviewed, examined, and all clinical data has been reviewed.  Agree with note. We will see what her kidney function is tomorrow. We will see how he does with rehab today.   Progress Note  Patient Name: Antonio Cervantes Date of Encounter: 05/22/2021  [facility] Cardiologist: None   Subjective   Feeling well this morning.   Inpatient Medications    Scheduled Meds:  aspirin EC  81 mg Oral Daily   atorvastatin  80 mg Oral Daily   clopidogrel  75 mg Oral Daily   dapagliflozin propanediol  10 mg Oral Daily   digoxin  0.125 mg Oral Daily   insulin aspart  0-15 Units Subcutaneous TID WC   insulin aspart  0-5 Units Subcutaneous QHS   levothyroxine  25 mcg Intravenous Daily   metoprolol succinate  12.5 mg Oral Daily   sodium chloride flush  3 mL Intravenous Q12H   sodium chloride flush  3 mL Intravenous Q12H   Continuous Infusions:  sodium chloride     sodium chloride Stopped (05/21/21 2045)   PRN Meds: sodium chloride, acetaminophen, albuterol, ondansetron **OR** ondansetron (ZOFRAN) IV, ondansetron (ZOFRAN) IV, sodium chloride flush   Vital Signs    Vitals:   05/21/21 1930 05/21/21 2012 05/22/21 0146 05/22/21 0504  BP: (!) 95/51 136/81 113/78 129/82  Pulse: 70 74 83 72  Resp: 16  16 16   Temp:  97.8 F (36.6 C) 98.7 F (37.1 C) 97.8 F (36.6 C)  TempSrc:  Oral Oral Oral  SpO2: 94% 98% 93% 96%  Weight:  75.9 kg    Height:  5\' 6"  (1.676 m)      Intake/Output Summary (Last 24 hours) at 05/22/2021 0747 Last data filed at 05/22/2021 0700 Gross per 24 hour  Intake 1544.72 ml  Output 1800 ml  Net -255.28 ml   Last 3 Weights 05/21/2021 05/19/2021 07/14/2016  Weight (lbs) 167 lb 6.4 oz 180 lb 199 lb  Weight (kg) 75.932 kg 81.647 kg 90.266 kg      Telemetry    SR  - Personally Reviewed  ECG    No new  tracing this morning  Physical Exam   GEN: No acute distress.   Neck: No JVD Cardiac: RRR, no murmurs, rubs, or gallops.  Respiratory: Clear to auscultation bilaterally. GI: Soft, nontender, non-distended  MS: No edema; No deformity. Right radial cath site stable.  Neuro:  Nonfocal  Psych: Normal affect   Labs    High Sensitivity Troponin:   Recent Labs  Lab 05/19/21 1832 05/19/21 2032 05/21/21 0101  TROPONINIHS 17 1,188* 8,787*     Chemistry Recent Labs  Lab 05/19/21 1832 05/19/21 1847 05/20/21 0340 05/21/21 0101 05/21/21 1617 05/21/21 1622 05/22/21 0415  NA 130*   < > 134* 130* 134* 135   134* 130*  K 4.4   < > 4.5 4.2 4.1 4.1   4.1 4.2  CL 103   < > 105 99  --   --  99  CO2 18*  --  22 20*  --   --  23  GLUCOSE 283*   < > 118* 104*  --   --  93  BUN 15   < > 18 21  --   --  20  CREATININE 1.40*   < > 1.48* 1.60*  --   --  1.58*  CALCIUM 8.4*  --  8.3* 8.6*  --   --  8.9  MG  --   --  1.7  --   --   --  1.7  PROT 6.9  --  6.2*  --   --   --  6.6  ALBUMIN 3.5  --  3.2*  --   --   --  3.3*  AST 16  --  52*  --   --   --  30  ALT 11  --  14  --   --   --  12  ALKPHOS 50  --  44  --   --   --  51  BILITOT 0.5  --  0.7  --   --   --  0.6  GFRNONAA 55*  --  52* 47*  --   --  48*  ANIONGAP 9  --  7 11  --   --  8   < > = values in this interval not displayed.    Lipids  Recent Labs  Lab 05/19/21 1832  CHOL 204*  TRIG 154*  HDL 25*  LDLCALC 148*  CHOLHDL 8.2    Hematology Recent Labs  Lab 05/20/21 1420 05/21/21 0101 05/21/21 1617 05/21/21 1622 05/22/21 0415  WBC 9.5 9.8  --   --  9.9  RBC 3.69* 3.89*  --   --  3.95*  HGB 11.3* 11.7* 12.6* 12.6*   12.9* 12.5*  HCT 34.1* 35.2* 37.0* 37.0*   38.0* 35.4*  MCV 92.4 90.5  --   --  89.6  MCH 30.6 30.1  --   --  31.6  MCHC 33.1 33.2  --   --  35.3  RDW 13.6 13.3  --   --  13.1  PLT 242 239  --   --  261   Thyroid  Recent Labs  Lab 05/20/21 2231  TSH 108.608*  FREET4 0.46*    BNP Recent Labs   Lab 05/19/21 1847  BNP 814.9*    DDimer No results for input(s): DDIMER in the last 168 hours.   Radiology    CARDIAC CATHETERIZATION  Result Date: 05/21/2021   Prox LAD to Mid LAD lesion is 75% stenosed.   Dist LAD lesion is 80% stenosed.   1st Diag lesion is 80% stenosed.   Ost RCA to Mid RCA lesion is 99% stenosed.   2nd Mrg lesion is 99% stenosed.   Dist Cx lesion is 90% stenosed.   Prox Cx to Mid Cx lesion is 75% stenosed.   LV end diastolic pressure is normal.   There is no aortic valve stenosis. Severe, multivessel coronary artery disease.  Normal LVEDP. Given diffuse, distal vessel disease, there are no good options for revascularization.  Plan for medical therapy to try to improve his LV function.  Probable heart failure consultation as well.   ECHOCARDIOGRAM COMPLETE  Result Date: 05/20/2021    ECHOCARDIOGRAM REPORT   Patient Name:   Antonio Cervantes Date of Exam: 05/20/2021 Medical Rec #:  [id number]         Height:       65.0 in Accession #:    [id number]        Weight:       180.0 lb Date of Birth:  [birth year]        BSA:          1.892 m Patient Age:    66 years  BP:           113/81 mmHg Patient Gender: M                 HR:           78 bpm. Exam Location:  Inpatient Procedure: 2D Echo, 3D Echo, Cardiac Doppler and Color Doppler REPORT CONTAINS CRITICAL RESULT Indications:    I50.40* Unspecified combined systolic (congestive) and diastolic                 (congestive) heart failure  History:        Patient has no prior history of Echocardiogram examinations.                 Abnormal ECG, Arrythmias:Atrial Fibrillation,                 Signs/Symptoms:Shortness of Breath and Dyspnea; Risk                 Factors:Diabetes. Pulmoary edema.  Sonographer:    Roseanna Rainbow RDCS Referring Phys: [place]  1. Septal apical mid/distal anterior wall and inferior apex akinetic suggesting LAD infarct. Left ventricular ejection fraction, by estimation, is 20 to 25%. The left  ventricle has severely decreased function. The left ventricle demonstrates regional wall motion abnormalities (see scoring diagram/findings for description). The left ventricular internal cavity size was severely dilated. There is mild left ventricular hypertrophy. Left ventricular diastolic parameters were normal.  2. Right ventricular systolic function is normal. The right ventricular size is normal. There is normal pulmonary artery systolic pressure.  3. The mitral valve is normal in structure. Mild mitral valve regurgitation. No evidence of mitral stenosis.  4. Degree of AS hard to judge due to severe decrease in LV function DVI 0.55 AVA 2 cm2 but morphology and gradients suggest AS could be moderate . The aortic valve is normal in structure. There is moderate calcification of the aortic valve. There is moderate thickening of the aortic valve. Aortic valve regurgitation is mild. Aortic valve sclerosis/calcification is present, without any evidence of aortic stenosis.  5. The inferior vena cava is normal in size with greater than 50% respiratory variability, suggesting right atrial pressure of 3 mmHg. FINDINGS  Left Ventricle: Septal apical mid/distal anterior wall and inferior apex akinetic suggesting LAD infarct. Left ventricular ejection fraction, by estimation, is 20 to 25%. The left ventricle has severely decreased function. The left ventricle demonstrates regional wall motion abnormalities. The left ventricular internal cavity size was severely dilated. There is mild left ventricular hypertrophy. Left ventricular diastolic parameters were normal. Right Ventricle: The right ventricular size is normal. No increase in right ventricular wall thickness. Right ventricular systolic function is normal. There is normal pulmonary artery systolic pressure. The tricuspid regurgitant velocity is 2.35 m/s, and  with an assumed right atrial pressure of 3 mmHg, the estimated right ventricular systolic pressure is 99991111  mmHg. Left Atrium: Left atrial size was normal in size. Right Atrium: Right atrial size was normal in size. Pericardium: There is no evidence of pericardial effusion. Mitral Valve: The mitral valve is normal in structure. Mild mitral valve regurgitation. No evidence of mitral valve stenosis. Tricuspid Valve: The tricuspid valve is normal in structure. Tricuspid valve regurgitation is not demonstrated. No evidence of tricuspid stenosis. Aortic Valve: Degree of AS hard to judge due to severe decrease in LV function DVI 0.55 AVA 2 cm2 but morphology and gradients suggest AS could be moderate. The aortic valve is normal in  structure. There is moderate calcification of the aortic valve. There is moderate thickening of the aortic valve. Aortic valve regurgitation is mild. Aortic regurgitation PHT measures 358 msec. Aortic valve sclerosis/calcification is present, without any evidence of aortic stenosis. Aortic valve mean gradient measures 10.0 mmHg. Aortic valve peak gradient measures 17.5 mmHg. Aortic valve area, by VTI measures 1.74 cm. Pulmonic Valve: The pulmonic valve was normal in structure. Pulmonic valve regurgitation is not visualized. No evidence of pulmonic stenosis. Aorta: The aortic root is normal in size and structure. Venous: The inferior vena cava is normal in size with greater than 50% respiratory variability, suggesting right atrial pressure of 3 mmHg. IAS/Shunts: No atrial level shunt detected by color flow Doppler.  LEFT VENTRICLE PLAX 2D LVIDd:         5.10 cm     Diastology LVIDs:         4.30 cm     LV e' medial:    4.88 cm/s LV PW:         1.00 cm     LV E/e' medial:  13.9 LV IVS:        1.10 cm     LV e' lateral:   7.13 cm/s LVOT diam:     2.00 cm     LV E/e' lateral: 9.5 LV SV:         69 LV SV Index:   37 LVOT Area:     3.14 cm  LV Volumes (MOD) LV vol d, MOD A2C: 84.4 ml LV vol d, MOD A4C: 95.3 ml LV vol s, MOD A2C: 58.3 ml LV vol s, MOD A4C: 74.3 ml LV SV MOD A2C:     26.1 ml LV SV MOD A4C:      95.3 ml LV SV MOD BP:      27.1 ml RIGHT VENTRICLE            IVC RV S prime:     6.68 cm/s  IVC diam: 1.90 cm TAPSE (M-mode): 1.1 cm LEFT ATRIUM           Index        RIGHT ATRIUM           Index LA diam:      3.60 cm 1.90 cm/m   RA Area:     11.40 cm LA Vol (A2C): 31.2 ml 16.49 ml/m  RA Volume:   23.60 ml  12.48 ml/m LA Vol (A4C): 25.6 ml 13.53 ml/m  AORTIC VALVE AV Area (Vmax):    2.06 cm AV Area (Vmean):   1.99 cm AV Area (VTI):     1.74 cm AV Vmax:           209.40 cm/s AV Vmean:          146.200 cm/s AV VTI:            0.400 m AV Peak Grad:      17.5 mmHg AV Mean Grad:      10.0 mmHg LVOT Vmax:         137.00 cm/s LVOT Vmean:        92.700 cm/s LVOT VTI:          0.221 m LVOT/AV VTI ratio: 0.55 AI PHT:            358 msec  AORTA Ao Root diam: 3.20 cm Ao Asc diam:  3.30 cm MITRAL VALVE               TRICUSPID VALVE  MV Area (PHT): 4.52 cm    TR Peak grad:   22.1 mmHg MV Decel Time: 168 msec    TR Vmax:        235.00 cm/s MV E velocity: 67.83 cm/s MV A velocity: 73.13 cm/s  SHUNTS MV E/A ratio:  0.93        Systemic VTI:  0.22 m                            Systemic Diam: 2.00 cm Jenkins Rouge MD Electronically signed by Jenkins Rouge MD Signature Date/Time: 05/20/2021/4:39:12 PM    Final     Cardiac Studies   Echo: 05/20/21  IMPRESSIONS     1. Septal apical mid/distal anterior wall and inferior apex akinetic  suggesting LAD infarct. Left ventricular ejection fraction, by estimation,  is 20 to 25%. The left ventricle has severely decreased function. The left  ventricle demonstrates regional  wall motion abnormalities (see scoring diagram/findings for description).  The left ventricular internal cavity size was severely dilated. There is  mild left ventricular hypertrophy. Left ventricular diastolic parameters  were normal.   2. Right ventricular systolic function is normal. The right ventricular  size is normal. There is normal pulmonary artery systolic pressure.   3. The mitral valve is  normal in structure. Mild mitral valve  regurgitation. No evidence of mitral stenosis.   4. Degree of AS hard to judge due to severe decrease in LV function DVI  0.55 AVA 2 cm2 but morphology and gradients suggest AS could be moderate .  The aortic valve is normal in structure. There is moderate calcification  of the aortic valve. There is  moderate thickening of the aortic valve. Aortic valve regurgitation is  mild. Aortic valve sclerosis/calcification is present, without any  evidence of aortic stenosis.   5. The inferior vena cava is normal in size with greater than 50%  respiratory variability, suggesting right atrial pressure of 3 mmHg.   FINDINGS   Left Ventricle: Septal apical mid/distal anterior wall and inferior apex  akinetic suggesting LAD infarct. Left ventricular ejection fraction, by  estimation, is 20 to 25%. The left ventricle has severely decreased  function. The left ventricle  demonstrates regional wall motion abnormalities. The left ventricular  internal cavity size was severely dilated. There is mild left ventricular  hypertrophy. Left ventricular diastolic parameters were normal.   Right Ventricle: The right ventricular size is normal. No increase in  right ventricular wall thickness. Right ventricular systolic function is  normal. There is normal pulmonary artery systolic pressure. The tricuspid  regurgitant velocity is 2.35 m/s, and   with an assumed right atrial pressure of 3 mmHg, the estimated right  ventricular systolic pressure is 99991111 mmHg.   Left Atrium: Left atrial size was normal in size.   Right Atrium: Right atrial size was normal in size.   Pericardium: There is no evidence of pericardial effusion.   Mitral Valve: The mitral valve is normal in structure. Mild mitral valve  regurgitation. No evidence of mitral valve stenosis.   Tricuspid Valve: The tricuspid valve is normal in structure. Tricuspid  valve regurgitation is not demonstrated. No  evidence of tricuspid  stenosis.   Aortic Valve: Degree of AS hard to judge due to severe decrease in LV  function DVI 0.55 AVA 2 cm2 but morphology and gradients suggest AS could  be moderate. The aortic valve is normal in structure. There is moderate  calcification  of the aortic valve.  There is moderate thickening of the aortic valve. Aortic valve  regurgitation is mild. Aortic regurgitation PHT measures 358 msec. Aortic  valve sclerosis/calcification is present, without any evidence of aortic  stenosis. Aortic valve mean gradient  measures 10.0 mmHg. Aortic valve peak gradient measures 17.5 mmHg. Aortic  valve area, by VTI measures 1.74 cm.   Pulmonic Valve: The pulmonic valve was normal in structure. Pulmonic valve  regurgitation is not visualized. No evidence of pulmonic stenosis.   Aorta: The aortic root is normal in size and structure.   Venous: The inferior vena cava is normal in size with greater than 50%  respiratory variability, suggesting right atrial pressure of 3 mmHg.   IAS/Shunts: No atrial level shunt detected by color flow Doppler.   Cath: 05/21/21    Prox LAD to Mid LAD lesion is 75% stenosed.   Dist LAD lesion is 80% stenosed.   1st Diag lesion is 80% stenosed.   Ost RCA to Mid RCA lesion is 99% stenosed.   2nd Mrg lesion is 99% stenosed.   Dist Cx lesion is 90% stenosed.   Prox Cx to Mid Cx lesion is 75% stenosed.   LV end diastolic pressure is normal.   There is no aortic valve stenosis.   Severe, multivessel coronary artery disease.  Normal LVEDP.   Given diffuse, distal vessel disease, there are no good options for revascularization.  Plan for medical therapy to try to improve his LV function.  Probable heart failure consultation as well.  Diagnostic Dominance: Right   Patient Profile     66 y.o. male with PMH of DM, hypothyroidism, tobacco use who presented with dyspnea, palpitations and found to be in Afib RVR.   Assessment & Plan     NSTEMI: hsTn 8787, underwent cardiac cath noted above with severe multivessel CAD, but unfortunately no targets for intervention or revascularization. Recommendations for medical therapy. IV nitro stopped post cath.  -- on ASA, plavix, atorvastatin 80mg  daily, BB. Given no intervention, will stop ASA/plavix for now after discussion with MD -- order CR  HFrEF: LVEF 20-25% with septal apical mid/distal anterior wall and inferior apex akinesis. Normal LVEDP.  -- GDMT: on Toprol 12.5mg  daily, Farxgia, Dig 0.125mg  daily. Unable to add ARB/Entresto with Cr today, consider prior to DC if renal function stable/tolerable BP  Atrial fibrillation with RVR: remains in SR this morning -- needs OAC, will add Eliquis 5mg  BID  -- continue Toprol 12.5mg  daily  DM: Hgb A1c 7.3 -- added Farxgia   HLD: LDL 148 -- on atorvastatin 80mg  daily  Hypothyroidism: TSH 108 -- management per primary   Needs to ambulate today with CR. TOC CHF clinic follow up when ready for DC.   For questions or updates, please contact [place] Please consult [email or web address] for contact info under        Signed, Reino Bellis, NP  05/22/2021, 7:47 AM

## 2021-05-22 NOTE — Progress Notes (Signed)
Heart Failure Stewardship Pharmacist Progress Note   PCP: Jeannene Patella, PA PCP-Cardiologist: None    HPI:  66 yo M with PMH of T2DM and peripheral neuropathy. He presented to the ED on 1/22 with shortness of breath and chest burning (not painful). CXR suspicious of pulmonary edema. An ECHO was done on 1/23 and LVEF is 20-25%. R/LHC on 1/24 with multivessel CAD (medical management), PA 17, PCWP 6, CO 5.4.   Current HF Medications: Beta Blocker: metoprolol XL 12.5 mg daily SGLT2i: Farxiga 10 mg daily Other: digoxin 0.125 mg daily  Prior to admission HF Medications: None  Pertinent Lab Values: Serum creatinine 1.58, BUN 20, Potassium 4.2, Sodium 130, BNP 814.9, Magnesium 1.7, A1c 7.3, Digoxin due in 1 week   Vital Signs: Weight: 167 lbs (admission weight: 167 lbs) Blood pressure: 120/80s  Heart rate: 70s  I/O: -646mL yesterday; net -2.6L  Medication Assistance / Insurance Benefits Check: Does the patient have prescription insurance?  Yes Type of insurance plan: [facility] Medicare  Outpatient Pharmacy:  Prior to admission outpatient pharmacy: [facility] Is the patient willing to use [facility] at discharge? Yes Is the patient willing to transition their outpatient pharmacy to utilize a [facility] outpatient pharmacy?   Pending   Assessment: 1. 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 NYHA class II symptoms. - Continue metoprolol XL 12.5 mg daily - No ACE/ARB/ARNI or spironolactone with Cr bump, stable today  - Continue Farxiga 10 mg daily - Continue digoxin 0.125 mg daily - level due in 1 week   Plan: 1) Medication changes recommended at this time: - None  2) Patient assistance: - Entresto copay $47 per month - Farxiga copay $47 per month - Can help enroll in pt assistance if copays are unaffordable  3)  Education  - To be completed prior to discharge  Kerby Nora, PharmD, BCPS Heart Failure Cytogeneticist Phone [phone]

## 2021-05-22 NOTE — Evaluation (Signed)
Physical Therapy Evaluation Patient Details Name: Antonio Cervantes MRN: [id number] DOB: [birth year] Today's Date: 05/22/2021  History of Present Illness  Pt is a 66 y/o M presenting to ED on 1/22 with respiratory distress, 1-2 months of progressive exertional dyspnea. CXR significant for pulmonary edema, on IV lasix. Found to have NSTEMI, A fib with RVR and increased troponins. PMH significant for DMII and tobacco use.  Clinical Impression  Pt admitted with/for problem as stated above.  Pt needing min guard for generally mobility overall.  Pt presently mildly weak and decondition, but should make steady improvements..  Pt currently limited functionally due to the problems listed below.  (see problems list.)  Pt will benefit from PT to maximize function and safety to be able to get home safely with available assist .        Recommendations for follow up therapy are one component of a multi-disciplinary discharge planning process, led by the attending physician.  Recommendations may be updated based on patient status, additional functional criteria and insurance authorization.  Follow Up Recommendations Other (comment) (Consider CRP2)    Assistance Recommended at Discharge Intermittent Supervision/Assistance (initially)  Patient can return home with the following  Assistance with cooking/housework;Assist for transportation    Equipment Recommendations Other (comment) (TBD)  Recommendations for Other Services       Functional Status Assessment Patient has had a recent decline in their functional status and demonstrates the ability to make significant improvements in function in a reasonable and predictable amount of time.     Precautions / Restrictions Precautions Precautions: None      Mobility  Bed Mobility Overal bed mobility: Modified Independent             General bed mobility comments: able to sit EOB without external assist    Transfers Overall transfer level:  Needs assistance Equipment used: None Transfers: Sit to/from Stand Sit to Stand: Min guard           General transfer comment: using legs against the bed for stability, no assist    Ambulation/Gait Ambulation/Gait assistance: Min guard Gait Distance (Feet): 175 Feet Assistive device: None Gait Pattern/deviations: Step-through pattern   Gait velocity interpretation: 1.31 - 2.62 ft/sec, indicative of limited community ambulator   General Gait Details: mildly unsteady with no LOB.  Pt able to increase speed, change direction abruptly, step over obstacles.  No over dyspnea.  HR in the mid to upper 110's bpm with exertion.  [facility]  Stairs            Wheelchair Mobility    Modified Rankin (Stroke Patients Only)       Balance Overall balance assessment: No apparent balance deficits (not formally assessed), Needs assistance   Sitting balance-Leahy Scale: Good       Standing balance-Leahy Scale: Good                               Pertinent Vitals/Pain Pain Assessment Pain Assessment: No/denies pain    Home Living Family/patient expects to be discharged to:: Private residence Living Arrangements: Parent Available Help at Discharge: Family Type of Home: House Home Access: Other (comment) (reports no steps to enter basement where he resides, has a few steps to enter house from main entrance and a flight if he goes upstairs)       Home Layout: Two level;Able to live on main level with bedroom/bathroom Home Equipment: None Additional Comments: lives with  mom in basement level, plans to ask son if he can stay with pt for a while    Prior Function Prior Level of Function : Independent/Modified Independent;Driving             Mobility Comments: no AD ADLs Comments: does IADLs     Hand Dominance   Dominant Hand: Right    Extremity/Trunk Assessment   Upper Extremity Assessment Upper Extremity Assessment: Overall WFL for tasks assessed     Lower Extremity Assessment Lower Extremity Assessment: Overall WFL for tasks assessed       Communication   Communication: No difficulties  Cognition Arousal/Alertness: Awake/alert Behavior During Therapy: WFL for tasks assessed/performed Overall Cognitive Status: Within Functional Limits for tasks assessed                                          General Comments General comments (skin integrity, edema, etc.): VSS    Exercises     Assessment/Plan    PT Assessment Patient needs continued PT services  PT Problem List Decreased activity tolerance;Decreased mobility;Cardiopulmonary status limiting activity       PT Treatment Interventions Gait training;Stair training;Functional mobility training;Therapeutic activities;Patient/family education    PT Goals (Current goals can be found in the Care Plan section)  Acute Rehab PT Goals Patient Stated Goal: Home independent, PT Goal Formulation: With patient Time For Goal Achievement: 06/05/21 Potential to Achieve Goals: Good    Frequency Min 3X/week     Co-evaluation               AM-PAC PT "6 Clicks" Mobility  Outcome Measure Help needed turning from your back to your side while in a flat bed without using bedrails?: None Help needed moving from lying on your back to sitting on the side of a flat bed without using bedrails?: None Help needed moving to and from a bed to a chair (including a wheelchair)?: A Little Help needed standing up from a chair using your arms (e.g., wheelchair or bedside chair)?: A Little Help needed to walk in hospital room?: A Little Help needed climbing 3-5 steps with a railing? : A Little 6 Click Score: 20    End of Session   Activity Tolerance: Patient tolerated treatment well Patient left: in bed;with call bell/phone within reach Nurse Communication: Mobility status PT Visit Diagnosis: Other abnormalities of gait and mobility (R26.89)    Time: [id number] PT Time  Calculation (min) (ACUTE ONLY): 19 min   Charges:   PT Evaluation $PT Eval Moderate Complexity: 1 Mod          05/22/2021  Ginger Carne., PT Acute Rehabilitation Services [phone]  (pager) [phone]  (office)  Tessie Fass Dave Mannes 05/22/2021, 3:55 PM

## 2021-05-22 NOTE — Progress Notes (Signed)
PROGRESS NOTE    Antonio Cervantes  [id number] DOB: [birth year] DOA: 05/19/2021 PCP: Jeannene Patella, PA   Chief Complain: Chest pain, dyspnea  Brief Narrative:  Patient is a 66 year old male  with history of diabetes type 2, hypothyroidism, gout who presented here with complaints of progressive exertional dyspnea, chest burning after dinner.  On presentation he was found to be hypoxic saturating 86% on room air, tachycardic, diaphoretic.  EKG showed A. fib on presentation.  Chest x-ray showed pulmonary edema.  Patient improved significantly after given IV Lasix.  Found to have elevated troponin.  Cardiology consulted, started on heparin drip.  Cardiac cath was done with finding of diffuse coronary artery disease, medical management recommended.  His respiratory status is currently stable, on room air.  Cardiology following.  Assessment & Plan:   Principal Problem:   Flash pulmonary edema (HCC) Active Problems:   Acute respiratory failure with hypoxia (HCC)   Diabetic peripheral neuropathy associated with type 2 diabetes mellitus (HCC)   AKI (acute kidney injury) ([place])   Unspecified atrial fibrillation (HCC)   DM (diabetes mellitus), type 2 (HCC)   Acute systolic heart failure (HCC)   Acute respiratory failure with hypoxia due to flash pulmonary edema: Resolved with IV Lasix.  Currently on room air.  Currently euvolemic.  NSTEMI: Presented with chest discomfort.  Elevated troponin.  Initially started on heparin drip.  Echo showed regional wall motion abnormalities Underwent cardiac cath with finding of severe multivessel coronary disease, no targets for intervention or revascularization.  Medical management recommended.  On aspirin, Plavix, Lipitor.  Cardiology is recommending cardiac rehab  Heart failure with reduced ejection fraction:  Currently euvolemic.  Echo showed EF of 20 to 25%.  Started on Toprol, Farxiga, digoxin  New onset A. fib: Was in A. fib on presentation.   Continue metoprolol for rate control.  On Eliquis  Diabetes type 2: Hemoglobin A1c of 7.3.  On Farxiga  Severe hypothyroidism: Previously taking levothyroxine but had to stop taking it about a year ago.  TSH more than 100 with low T4 and T3.  Continue levothyroxine 75 mcg for now.  Needs to follow-up with PCP and repeat TFT in 3 to 4 weeks for dose titration.  Hyperlipidemia: Lipitor.  LDL of 148  AKI: Baseline kidney function normal.  Currently gentle IV fluids  Leukocytosis: Resolved  Hyponatremia: Continue monitoring  Suspected peripheral vascular disease: Already on a statin, aspirin, follow-up with vascular surgery, as an outpatient  Debility/deconditioning: PT/OT recommend home health on discharge      DVT prophylaxis:Eliquis Code Status: Full Family Communication: None at bedside Patient status:Inpatient  Dispo: The patient is from: home              Anticipated d/c is to: home               Anticipated d/c date is: tomorrow  Consultants: Cardiology  Procedures:None  Antimicrobials:  Anti-infectives (From admission, onward)    None       Subjective:  Patient seen and examined at the bedside this morning.  Hemodynamically stable comfortable.  No complaints today  Objective: Vitals:   05/22/21 0729 05/22/21 1023 05/22/21 1139 05/22/21 1200  BP: (!) 137/98 116/77 104/72 110/64  Pulse: 90 84 78 76  Resp: 16  16 16   Temp: 98.2 F (36.8 C)  97.8 F (36.6 C) 98.2 F (36.8 C)  TempSrc: Oral  Oral Oral  SpO2: 95%     Weight:  Height:        Intake/Output Summary (Last 24 hours) at 05/22/2021 1321 Last data filed at 05/22/2021 1033 Gross per 24 hour  Intake 1547.72 ml  Output 1800 ml  Net -252.28 ml   Filed Weights   05/19/21 1850 05/21/21 2012  Weight: 81.6 kg 75.9 kg    Examination:  General exam: Overall comfortable, not in distress HEENT: PERRL Respiratory system:  no wheezes or crackles  Cardiovascular system: S1 & S2 heard, RRR.   Gastrointestinal system: Abdomen is nondistended, soft and nontender. Central nervous system: Alert and oriented Extremities: No edema, no clubbing ,no cyanosis Skin: No rashes, no ulcers,no icterus      Data Reviewed: I have personally reviewed following labs and imaging studies  CBC: Recent Labs  Lab 05/19/21 1832 05/19/21 1847 05/20/21 0340 05/20/21 1420 05/21/21 0101 05/21/21 1617 05/21/21 1622 05/22/21 0415  WBC 13.2*  --  9.9 9.5 9.8  --   --  9.9  NEUTROABS 7.4  --   --  6.5  --   --   --   --   HGB 13.4   < > 12.0* 11.3* 11.7* 12.6* 12.6*   12.9* 12.5*  HCT 42.3   < > 35.9* 34.1* 35.2* 37.0* 37.0*   38.0* 35.4*  MCV 95.5  --  90.9 92.4 90.5  --   --  89.6  PLT 285  --  257 242 239  --   --  261   < > = values in this interval not displayed.   Basic Metabolic Panel: Recent Labs  Lab 05/19/21 1832 05/19/21 1847 05/20/21 0340 05/21/21 0101 05/21/21 1617 05/21/21 1622 05/22/21 0415  NA 130* 133* 134* 130* 134* 135   134* 130*  K 4.4 4.6 4.5 4.2 4.1 4.1   4.1 4.2  CL 103 103 105 99  --   --  99  CO2 18*  --  22 20*  --   --  23  GLUCOSE 283* 274* 118* 104*  --   --  93  BUN 15 16 18 21   --   --  20  CREATININE 1.40* 1.30* 1.48* 1.60*  --   --  1.58*  CALCIUM 8.4*  --  8.3* 8.6*  --   --  8.9  MG  --   --  1.7  --   --   --  1.7   GFR: Estimated Creatinine Clearance: 41.5 mL/min (A) (by C-G formula based on SCr of 1.58 mg/dL (H)). Liver Function Tests: Recent Labs  Lab 05/19/21 1832 05/20/21 0340 05/22/21 0415  AST 16 52* 30  ALT 11 14 12   ALKPHOS 50 44 51  BILITOT 0.5 0.7 0.6  PROT 6.9 6.2* 6.6  ALBUMIN 3.5 3.2* 3.3*   No results for input(s): LIPASE, AMYLASE in the last 168 hours. No results for input(s): AMMONIA in the last 168 hours. Coagulation Profile: Recent Labs  Lab 05/19/21 1900  INR 1.1   Cardiac Enzymes: No results for input(s): CKTOTAL, CKMB, CKMBINDEX, TROPONINI in the last 168 hours. BNP (last 3 results) No results for  input(s): PROBNP in the last 8760 hours. HbA1C: Recent Labs    05/20/21 1420  HGBA1C 7.3*   CBG: Recent Labs  Lab 05/21/21 1140 05/21/21 1708 05/21/21 2022 05/22/21 0754 05/22/21 1138  GLUCAP 106* 88 142* 94 117*   Lipid Profile: Recent Labs    05/19/21 1832  CHOL 204*  HDL 25*  LDLCALC 148*  TRIG 154*  CHOLHDL 8.2  Thyroid Function Tests: Recent Labs    05/20/21 2231  TSH 108.608*  FREET4 0.46*  T3FREE 1.9*   Anemia Panel: No results for input(s): VITAMINB12, FOLATE, FERRITIN, TIBC, IRON, RETICCTPCT in the last 72 hours. Sepsis Labs: No results for input(s): PROCALCITON, LATICACIDVEN in the last 168 hours.  Recent Results (from the past 240 hour(s))  Resp Panel by RT-PCR (Flu A&B, Covid) Nasopharyngeal Swab     Status: None   Collection Time: 05/19/21  6:34 PM   Specimen: Nasopharyngeal Swab; Nasopharyngeal(NP) swabs in vial transport medium  Result Value Ref Range Status   SARS Coronavirus 2 by RT PCR NEGATIVE NEGATIVE Final    Comment: (NOTE) SARS-CoV-2 target nucleic acids are NOT DETECTED.  The SARS-CoV-2 RNA is generally detectable in upper respiratory specimens during the acute phase of infection. The lowest concentration of SARS-CoV-2 viral copies this assay can detect is 138 copies/mL. A negative result does not preclude SARS-Cov-2 infection and should not be used as the sole basis for treatment or other patient management decisions. A negative result may occur with  improper specimen collection/handling, submission of specimen other than nasopharyngeal swab, presence of viral mutation(s) within the areas targeted by this assay, and inadequate number of viral copies(<138 copies/mL). A negative result must be combined with clinical observations, patient history, and epidemiological information. The expected result is Negative.  Fact Sheet for Patients:  EntrepreneurPulse.com.au  Fact Sheet for Healthcare Providers:   IncredibleEmployment.be  This test is no t yet approved or cleared by the [place] FDA and  has been authorized for detection and/or diagnosis of SARS-CoV-2 by FDA under an Emergency Use Authorization (EUA). This EUA will remain  in effect (meaning this test can be used) for the duration of the COVID-19 declaration under Section 564(b)(1) of the Act, 21 U.S.C.section 360bbb-3(b)(1), unless the authorization is terminated  or revoked sooner.       Influenza A by PCR NEGATIVE NEGATIVE Final   Influenza B by PCR NEGATIVE NEGATIVE Final    Comment: (NOTE) The Xpert Xpress SARS-CoV-2/FLU/RSV plus assay is intended as an aid in the diagnosis of influenza from Nasopharyngeal swab specimens and should not be used as a sole basis for treatment. Nasal washings and aspirates are unacceptable for Xpert Xpress SARS-CoV-2/FLU/RSV testing.  Fact Sheet for Patients: EntrepreneurPulse.com.au  Fact Sheet for Healthcare Providers: IncredibleEmployment.be  This test is not yet approved or cleared by the [place] FDA and has been authorized for detection and/or diagnosis of SARS-CoV-2 by FDA under an Emergency Use Authorization (EUA). This EUA will remain in effect (meaning this test can be used) for the duration of the COVID-19 declaration under Section 564(b)(1) of the Act, 21 U.S.C. section 360bbb-3(b)(1), unless the authorization is terminated or revoked.  Performed at [facility], [place] [address] [place], [place] [address]          Radiology Studies: CARDIAC CATHETERIZATION  Result Date: 05/21/2021   Prox LAD to Mid LAD lesion is 75% stenosed.   Dist LAD lesion is 80% stenosed.   1st Diag lesion is 80% stenosed.   Ost RCA to Mid RCA lesion is 99% stenosed.   2nd Mrg lesion is 99% stenosed.   Dist Cx lesion is 90% stenosed.   Prox Cx to Mid Cx lesion is 75% stenosed.   LV end diastolic pressure is normal.   There is  no aortic valve stenosis. Severe, multivessel coronary artery disease.  Normal LVEDP. Given diffuse, distal vessel disease, there are no good  options for revascularization.  Plan for medical therapy to try to improve his LV function.  Probable heart failure consultation as well.   ECHOCARDIOGRAM COMPLETE  Result Date: 05/20/2021    ECHOCARDIOGRAM REPORT   Patient Name:   Antonio Cervantes Date of Exam: 05/20/2021 Medical Rec #:  [id number]         Height:       65.0 in Accession #:    [id number]        Weight:       180.0 lb Date of Birth:  20-Jun-1955        BSA:          1.892 m Patient Age:    65 years          BP:           113/81 mmHg Patient Gender: M                 HR:           78 bpm. Exam Location:  Inpatient Procedure: 2D Echo, 3D Echo, Cardiac Doppler and Color Doppler REPORT CONTAINS CRITICAL RESULT Indications:    I50.40* Unspecified combined systolic (congestive) and diastolic                 (congestive) heart failure  History:        Patient has no prior history of Echocardiogram examinations.                 Abnormal ECG, Arrythmias:Atrial Fibrillation,                 Signs/Symptoms:Shortness of Breath and Dyspnea; Risk                 Factors:Diabetes. Pulmoary edema.  Sonographer:    Sheralyn Boatman RDCS Referring Phys: [id number] ERIC CHEN IMPRESSIONS  1. Septal apical mid/distal anterior wall and inferior apex akinetic suggesting LAD infarct. Left ventricular ejection fraction, by estimation, is 20 to 25%. The left ventricle has severely decreased function. The left ventricle demonstrates regional wall motion abnormalities (see scoring diagram/findings for description). The left ventricular internal cavity size was severely dilated. There is mild left ventricular hypertrophy. Left ventricular diastolic parameters were normal.  2. Right ventricular systolic function is normal. The right ventricular size is normal. There is normal pulmonary artery systolic pressure.  3. The mitral valve is normal in  structure. Mild mitral valve regurgitation. No evidence of mitral stenosis.  4. Degree of AS hard to judge due to severe decrease in LV function DVI 0.55 AVA 2 cm2 but morphology and gradients suggest AS could be moderate . The aortic valve is normal in structure. There is moderate calcification of the aortic valve. There is moderate thickening of the aortic valve. Aortic valve regurgitation is mild. Aortic valve sclerosis/calcification is present, without any evidence of aortic stenosis.  5. The inferior vena cava is normal in size with greater than 50% respiratory variability, suggesting right atrial pressure of 3 mmHg. FINDINGS  Left Ventricle: Septal apical mid/distal anterior wall and inferior apex akinetic suggesting LAD infarct. Left ventricular ejection fraction, by estimation, is 20 to 25%. The left ventricle has severely decreased function. The left ventricle demonstrates regional wall motion abnormalities. The left ventricular internal cavity size was severely dilated. There is mild left ventricular hypertrophy. Left ventricular diastolic parameters were normal. Right Ventricle: The right ventricular size is normal. No increase in right ventricular wall thickness. Right ventricular systolic function is normal. There is normal  pulmonary artery systolic pressure. The tricuspid regurgitant velocity is 2.35 m/s, and  with an assumed right atrial pressure of 3 mmHg, the estimated right ventricular systolic pressure is [id number] mmHg. Left Atrium: Left atrial size was normal in size. Right Atrium: Right atrial size was normal in size. Pericardium: There is no evidence of pericardial effusion. Mitral Valve: The mitral valve is normal in structure. Mild mitral valve regurgitation. No evidence of mitral valve stenosis. Tricuspid Valve: The tricuspid valve is normal in structure. Tricuspid valve regurgitation is not demonstrated. No evidence of tricuspid stenosis. Aortic Valve: Degree of AS hard to judge due to severe  decrease in LV function DVI 0.55 AVA 2 cm2 but morphology and gradients suggest AS could be moderate. The aortic valve is normal in structure. There is moderate calcification of the aortic valve. There is moderate thickening of the aortic valve. Aortic valve regurgitation is mild. Aortic regurgitation PHT measures 358 msec. Aortic valve sclerosis/calcification is present, without any evidence of aortic stenosis. Aortic valve mean gradient measures 10.0 mmHg. Aortic valve peak gradient measures 17.5 mmHg. Aortic valve area, by VTI measures 1.74 cm. Pulmonic Valve: The pulmonic valve was normal in structure. Pulmonic valve regurgitation is not visualized. No evidence of pulmonic stenosis. Aorta: The aortic root is normal in size and structure. Venous: The inferior vena cava is normal in size with greater than 50% respiratory variability, suggesting right atrial pressure of 3 mmHg. IAS/Shunts: No atrial level shunt detected by color flow Doppler.  LEFT VENTRICLE PLAX 2D LVIDd:         5.10 cm     Diastology LVIDs:         4.30 cm     LV e' medial:    4.88 cm/s LV PW:         1.00 cm     LV E/e' medial:  13.9 LV IVS:        1.10 cm     LV e' lateral:   7.13 cm/s LVOT diam:     2.00 cm     LV E/e' lateral: 9.5 LV SV:         69 LV SV Index:   37 LVOT Area:     3.14 cm  LV Volumes (MOD) LV vol d, MOD A2C: 84.4 ml LV vol d, MOD A4C: 95.3 ml LV vol s, MOD A2C: 58.3 ml LV vol s, MOD A4C: 74.3 ml LV SV MOD A2C:     26.1 ml LV SV MOD A4C:     95.3 ml LV SV MOD BP:      27.1 ml RIGHT VENTRICLE            IVC RV S prime:     6.68 cm/s  IVC diam: 1.90 cm TAPSE (M-mode): 1.1 cm LEFT ATRIUM           Index        RIGHT ATRIUM           Index LA diam:      3.60 cm 1.90 cm/m   RA Area:     11.40 cm LA Vol (A2C): 31.2 ml 16.49 ml/m  RA Volume:   23.60 ml  12.48 ml/m LA Vol (A4C): 25.6 ml 13.53 ml/m  AORTIC VALVE AV Area (Vmax):    2.06 cm AV Area (Vmean):   1.99 cm AV Area (VTI):     1.74 cm AV Vmax:           209.40 cm/s AV  Vmean:  146.200 cm/s AV VTI:            0.400 m AV Peak Grad:      17.5 mmHg AV Mean Grad:      10.0 mmHg LVOT Vmax:         137.00 cm/s LVOT Vmean:        92.700 cm/s LVOT VTI:          0.221 m LVOT/AV VTI ratio: 0.55 AI PHT:            358 msec  AORTA Ao Root diam: 3.20 cm Ao Asc diam:  3.30 cm MITRAL VALVE               TRICUSPID VALVE MV Area (PHT): 4.52 cm    TR Peak grad:   22.1 mmHg MV Decel Time: 168 msec    TR Vmax:        235.00 cm/s MV E velocity: 67.83 cm/s MV A velocity: 73.13 cm/s  SHUNTS MV E/A ratio:  0.93        Systemic VTI:  0.22 m                            Systemic Diam: 2.00 cm Jenkins Rouge MD Electronically signed by Jenkins Rouge MD Signature Date/Time: 05/20/2021/4:39:12 PM    Final         Scheduled Meds:  apixaban  5 mg Oral BID   atorvastatin  80 mg Oral Daily   dapagliflozin propanediol  10 mg Oral Daily   digoxin  0.125 mg Oral Daily   fluticasone  2 spray Each Nare Daily   insulin aspart  0-15 Units Subcutaneous TID WC   insulin aspart  0-5 Units Subcutaneous QHS   levothyroxine  75 mcg Oral Q0600   metoprolol succinate  12.5 mg Oral Daily   sodium chloride flush  3 mL Intravenous Q12H   sodium chloride flush  3 mL Intravenous Q12H   Continuous Infusions:  sodium chloride     sodium chloride Stopped (05/21/21 2045)     LOS: 3 days    Time spent: More than 50% of that time was spent in counseling and/or coordination of care.      Shelly Coss, MD [facility] P1/25/2023, 1:21 PM

## 2021-05-22 NOTE — Progress Notes (Signed)
Pt refuses Bi-PAP via [employer] for the night. Pt not in any distress and vitals are stable. RT will continue to monitor as needed.

## 2021-05-22 NOTE — Discharge Instructions (Addendum)

## 2021-05-23 ENCOUNTER — Other Ambulatory Visit (HOSPITAL_COMMUNITY): Payer: Self-pay

## 2021-05-23 DIAGNOSIS — E1142 Type 2 diabetes mellitus with diabetic polyneuropathy: Secondary | ICD-10-CM

## 2021-05-23 LAB — BASIC METABOLIC PANEL
Anion gap: 9 (ref 5–15)
BUN: 21 mg/dL (ref 8–23)
CO2: 26 mmol/L (ref 22–32)
Calcium: 9.2 mg/dL (ref 8.9–10.3)
Chloride: 99 mmol/L (ref 98–111)
Creatinine, Ser: 1.54 mg/dL — ABNORMAL HIGH (ref 0.61–1.24)
GFR, Estimated: 49 mL/min — ABNORMAL LOW (ref >60)
Glucose, Bld: 122 mg/dL — ABNORMAL HIGH (ref 70–99)
Potassium: 4 mmol/L (ref 3.5–5.1)
Sodium: 134 mmol/L — ABNORMAL LOW (ref 135–145)

## 2021-05-23 LAB — GLUCOSE, CAPILLARY
Glucose-Capillary: 143 mg/dL — ABNORMAL HIGH (ref 70–99)
Glucose-Capillary: 126 mg/dL — ABNORMAL HIGH (ref 70–99)

## 2021-05-23 MED ORDER — APIXABAN 5 MG PO TABS
5.0000 mg | ORAL_TABLET | Freq: Two times a day (BID) | ORAL | 0 refills | Status: DC
Start: 2021-05-23 — End: 2021-05-31
  Filled 2021-05-23: qty 60, 30d supply, fill #0

## 2021-05-23 MED ORDER — ASPIRIN EC 81 MG PO TBEC
81.0000 mg | DELAYED_RELEASE_TABLET | Freq: Every day | ORAL | Status: DC
  Administered 2021-05-23: 81 mg via ORAL
  Filled 2021-05-23: qty 1

## 2021-05-23 MED ORDER — ATORVASTATIN CALCIUM 80 MG PO TABS
80.0000 mg | ORAL_TABLET | Freq: Every day | ORAL | 0 refills | Status: AC
  Filled 2021-05-23: qty 30, 30d supply, fill #0

## 2021-05-23 MED ORDER — ASPIRIN 81 MG PO TBEC
81.0000 mg | DELAYED_RELEASE_TABLET | Freq: Every day | ORAL | 11 refills | Status: DC
  Filled 2021-05-23: qty 30, 30d supply, fill #0

## 2021-05-23 MED ORDER — DIGOXIN 125 MCG PO TABS
0.1250 mg | ORAL_TABLET | Freq: Every day | ORAL | 0 refills | Status: DC
  Filled 2021-05-23: qty 30, 30d supply, fill #0

## 2021-05-23 MED ORDER — DAPAGLIFLOZIN PROPANEDIOL 10 MG PO TABS
10.0000 mg | ORAL_TABLET | Freq: Every day | ORAL | 0 refills | Status: DC
  Filled 2021-05-23: qty 30, 30d supply, fill #0

## 2021-05-23 MED ORDER — METOPROLOL SUCCINATE ER 25 MG PO TB24
12.5000 mg | ORAL_TABLET | Freq: Every day | ORAL | 0 refills | Status: DC
  Filled 2021-05-23: qty 30, 60d supply, fill #0

## 2021-05-23 MED ORDER — LEVOTHYROXINE SODIUM 75 MCG PO TABS
75.0000 ug | ORAL_TABLET | Freq: Every day | ORAL | 0 refills | Status: DC
  Filled 2021-05-23: qty 30, 30d supply, fill #0

## 2021-05-23 NOTE — Plan of Care (Signed)
  Problem: Cardiac: Goal: Ability to achieve and maintain adequate cardiopulmonary perfusion will improve Outcome: Progressing   

## 2021-05-23 NOTE — Progress Notes (Signed)
Heart Failure Nurse Navigator Progress Note  Updated AVS with HV TOC clinic appt for Friday 2/3 @ 11AM. Will have dig level drawn at appt. Pt has reliable vehicle and states he plans to attend.   Ozella Rocks, MSN, RN Heart Failure Nurse Navigator [phone]

## 2021-05-23 NOTE — Care Management (Signed)
05-23-21 1303 Case Manager received consult for home needs. Prior to arrival patient was from home with family support. Patient states he takes his medications appropriately and gets them from the pharmacy without any issues. Patient has transportation to appointments. Case Manager discussed home health services and the patient declined services at this time. Family at the bedside to transport patient home via private vehicle. No further needs from Case Manager at this time.

## 2021-05-23 NOTE — Progress Notes (Addendum)
The patient has been seen in conjunction with Harlan Stains, NP. All aspects of care have been considered and discussed. The patient has been personally interviewed, examined, and all clinical data has been reviewed.  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  Diffuse inoperable CAD with coronary artery targets that are not amenable to PCI.  Guideline directed therapy with goal of quadruple therapy for systolic dysfunction.  We will have outpatient heart failure clinic evaluation and comanagement. Unable to add Arni or spironolactone prior to discharge due to relatively soft blood pressures.  Currently on low-dose metoprolol succinate, and dapagliflozin.  Digoxin also added. 75 mcg of levothyroxine added.  Monitor for development of angina. May be a candidate for AICD given relatively young age. Kidney function is stable and now downtrending.  Perhaps as OP, if blood pressure is okay, [name] can be started.   Progress Note  Patient Name: Antonio Cervantes Date of Encounter: 05/23/2021  [facility] Cardiologist: None   Subjective   Feeling well this morning. Walked with PT yesterday and did well.   Inpatient Medications    Scheduled Meds:  apixaban  5 mg Oral BID   atorvastatin  80 mg Oral Daily   dapagliflozin propanediol  10 mg Oral Daily   digoxin  0.125 mg Oral Daily   fluticasone  2 spray Each Nare Daily   insulin aspart  0-15 Units Subcutaneous TID WC   insulin aspart  0-5 Units Subcutaneous QHS   levothyroxine  75 mcg Oral Q0600   metoprolol succinate  12.5 mg Oral Daily   sodium chloride flush  3 mL Intravenous Q12H   sodium chloride flush  3 mL Intravenous Q12H   Continuous Infusions:  sodium chloride     sodium chloride Stopped (05/21/21 2045)   PRN Meds: sodium chloride, acetaminophen, albuterol, ondansetron **OR** ondansetron (ZOFRAN) IV, sodium chloride, sodium chloride flush   Vital Signs    Vitals:   05/22/21 1139  05/22/21 1200 05/22/21 2006 05/23/21 0547  BP: 104/72 110/64 111/64 107/61  Pulse: 78 76 66 65  Resp: 16 16 16 16   Temp: 97.8 F (36.6 C) 98.2 F (36.8 C) 97.9 F (36.6 C) 98.2 F (36.8 C)  TempSrc: Oral Oral Oral Oral  SpO2:   98% 100%  Weight:    74.8 kg  Height:        Intake/Output Summary (Last 24 hours) at 05/23/2021 0813 Last data filed at 05/23/2021 0549 Gross per 24 hour  Intake 243 ml  Output 1350 ml  Net -1107 ml   Last 3 Weights 05/23/2021 05/21/2021 05/19/2021  Weight (lbs) 164 lb 12.8 oz 167 lb 6.4 oz 180 lb  Weight (kg) 74.753 kg 75.932 kg 81.647 kg      Telemetry    SR - Personally Reviewed  ECG    No new tracing  Physical Exam   GEN: No acute distress.   Neck: No JVD Cardiac: RRR, no murmurs, rubs, or gallops.  Respiratory: Clear to auscultation bilaterally. GI: Soft, nontender, non-distended  MS: No edema; No deformity. Neuro:  Nonfocal  Psych: Normal affect   Labs    High Sensitivity Troponin:   Recent Labs  Lab 05/19/21 1832 05/19/21 2032 05/21/21 0101  TROPONINIHS 17 1,188* 8,787*     Chemistry Recent Labs  Lab 05/19/21 1832 05/19/21 1847 05/20/21 0340 05/21/21 0101 05/21/21 1617 05/21/21 1622 05/22/21 0415 05/23/21 0547  NA 130*   < > 134* 130*   < > 135  134* 130* 134*  K 4.4   < > 4.5 4.2   < > 4.1   4.1 4.2 4.0  CL 103   < > 105 99  --   --  99 99  CO2 18*  --  22 20*  --   --  23 26  GLUCOSE 283*   < > 118* 104*  --   --  93 122*  BUN 15   < > 18 21  --   --  20 21  CREATININE 1.40*   < > 1.48* 1.60*  --   --  1.58* 1.54*  CALCIUM 8.4*  --  8.3* 8.6*  --   --  8.9 9.2  MG  --   --  1.7  --   --   --  1.7  --   PROT 6.9  --  6.2*  --   --   --  6.6  --   ALBUMIN 3.5  --  3.2*  --   --   --  3.3*  --   AST 16  --  52*  --   --   --  30  --   ALT 11  --  14  --   --   --  12  --   ALKPHOS 50  --  44  --   --   --  51  --   BILITOT 0.5  --  0.7  --   --   --  0.6  --   GFRNONAA 55*  --  52* 47*  --   --  48* 49*   ANIONGAP 9  --  7 11  --   --  8 9   < > = values in this interval not displayed.    Lipids  Recent Labs  Lab 05/19/21 1832  CHOL 204*  TRIG 154*  HDL 25*  LDLCALC 148*  CHOLHDL 8.2    Hematology Recent Labs  Lab 05/20/21 1420 05/21/21 0101 05/21/21 1617 05/21/21 1622 05/22/21 0415  WBC 9.5 9.8  --   --  9.9  RBC 3.69* 3.89*  --   --  3.95*  HGB 11.3* 11.7* 12.6* 12.6*   12.9* 12.5*  HCT 34.1* 35.2* 37.0* 37.0*   38.0* 35.4*  MCV 92.4 90.5  --   --  89.6  MCH 30.6 30.1  --   --  31.6  MCHC 33.1 33.2  --   --  35.3  RDW 13.6 13.3  --   --  13.1  PLT 242 239  --   --  261   Thyroid  Recent Labs  Lab 05/20/21 2231  TSH 108.608*  FREET4 0.46*    BNP Recent Labs  Lab 05/19/21 1847  BNP 814.9*    DDimer No results for input(s): DDIMER in the last 168 hours.   Radiology    CARDIAC CATHETERIZATION  Result Date: 05/21/2021   Prox LAD to Mid LAD lesion is 75% stenosed.   Dist LAD lesion is 80% stenosed.   1st Diag lesion is 80% stenosed.   Ost RCA to Mid RCA lesion is 99% stenosed.   2nd Mrg lesion is 99% stenosed.   Dist Cx lesion is 90% stenosed.   Prox Cx to Mid Cx lesion is 75% stenosed.   LV end diastolic pressure is normal.   There is no aortic valve stenosis. Severe, multivessel coronary artery disease.  Normal LVEDP. Given diffuse, distal vessel disease, there are  no good options for revascularization.  Plan for medical therapy to try to improve his LV function.  Probable heart failure consultation as well.    Cardiac Studies   Cath: 05/21/21    Prox LAD to Mid LAD lesion is 75% stenosed.   Dist LAD lesion is 80% stenosed.   1st Diag lesion is 80% stenosed.   Ost RCA to Mid RCA lesion is 99% stenosed.   2nd Mrg lesion is 99% stenosed.   Dist Cx lesion is 90% stenosed.   Prox Cx to Mid Cx lesion is 75% stenosed.   LV end diastolic pressure is normal.   There is no aortic valve stenosis.   Severe, multivessel coronary artery disease.  Normal LVEDP.    Given diffuse, distal vessel disease, there are no good options for revascularization.  Plan for medical therapy to try to improve his LV function.  Probable heart failure consultation as well.  Diagnostic Dominance: Right   Patient Profile     66 y.o. male with PMH of DM, hypothyroidism, tobacco use who presented with dyspnea, palpitations and found to be in Afib RVR.   Assessment & Plan    NSTEMI: hsTn 8787, underwent cardiac cath noted above with severe multivessel CAD, but unfortunately no targets for intervention or revascularization. Recommendations for medical therapy. IV nitro stopped post cath.  -- on ASA, plavix, atorvastatin 80mg  daily, BB. Given no intervention will treat with ASA 81mg  daily -- Ordered and seen by CR   HFrEF: LVEF 20-25% with septal apical mid/distal anterior wall and inferior apex akinesis. Normal LVEDP.  -- GDMT: on Toprol 12.5mg  daily, Farxgia, Dig 0.125mg  daily. Unable to add ARB/Entresto/spiro with Cr. Ideally add at outpatient follow up. -- will need PRN lasix at discharge   Atrial fibrillation with RVR: remains in SR this morning -- needs OAC, added Eliquis 5mg  BID  -- continue Toprol 12.5mg  daily   DM: Hgb A1c 7.3 -- added Farxgia    HLD: LDL 148 -- on atorvastatin 80mg  daily -- FLP/LFTs in 8 weeks, will send lipid clinic referral at [place]   Hypothyroidism: TSH 108 -- management per primary   TOC CHF appt arranged 2/3, needs dig level at that visit  For questions or updates, please contact [facility] Please consult [email or web address] for contact info under        Signed, Reino Bellis, NP  05/23/2021, 8:13 AM

## 2021-05-23 NOTE — Care Management Important Message (Signed)
Important Message  Patient Details  Name: Antonio Cervantes MRN: [id number] Date of Birth: [birth year]   Medicare Important Message Given:  Yes     Renie Ora 05/23/2021, 12:00 PM

## 2021-05-23 NOTE — Discharge Summary (Addendum)
Physician Discharge Summary  Antonio Cervantes [id number] DOB: Jan 07, 1956 DOA: 05/19/2021  PCP: Jeannene Patella, PA  Admit date: 05/19/2021 Discharge date: 05/23/2021  Admitted From: Home Disposition:  Home  Discharge Condition:Stable CODE STATUS:FULL Diet recommendation: Heart Healthy  Brief/Interim Summary: Patient is a 67 year old male  with history of diabetes type 2, hypothyroidism, gout who presented here with complaints of progressive exertional dyspnea, chest burning after dinner.  On presentation he was found to be hypoxic saturating 86% on room air, tachycardic, diaphoretic.  EKG showed A. fib on presentation.  Chest x-ray showed pulmonary edema.  Patient improved significantly after given IV Lasix.  Found to have elevated troponin.  Cardiology consulted, started on heparin drip.  Cardiac cath was done with finding of diffuse coronary artery disease, medical management recommended.  His respiratory status is currently stable, on room air.  Cardiology has cleared for discharge and he will follow-up as an outpatient.  He is medically stable for discharge home today.  Following problems were addressed during his hospitalization:  Acute respiratory failure with hypoxia due to flash pulmonary edema: Resolved with IV Lasix.  Currently on room air.  Currently euvolemic.   NSTEMI: Presented with chest discomfort.  Elevated troponin.  Initially started on heparin drip.  Echo showed regional wall motion abnormalities Underwent cardiac cath with finding of severe multivessel coronary disease, no targets for intervention or revascularization.  Medical management recommended.  On  Lipitor.  Cardiology is recommending cardiac rehab   Heart failure with reduced ejection fraction:  Currently euvolemic.  Echo showed EF of 20 to 25%.  Started on Toprol, Farxiga, digoxin   New onset A. fib: Was in A. fib on presentation.  Continue metoprolol for rate control.  On Eliquis   Diabetes type 2:  Hemoglobin A1c of 7.3.  On Farxiga   Severe hypothyroidism: Previously taking levothyroxine but had to stop taking it about a year ago.  TSH more than 100 with low T4 and T3.  Continue levothyroxine 75 mcg for now.  Needs to follow-up with PCP and repeat TFT in 3 to 4 weeks for dose titration.   Hyperlipidemia: On Lipitor.  LDL of 148   AKI: Baseline kidney function normal.  His creatinine has plateaued at 1.5.  Do BMP test in 1 to 2 weeks   Leukocytosis: Resolved   Hyponatremia: Mild   Suspected peripheral vascular disease: Already on a statin, aspirin, follow-up with vascular surgery, as an outpatient   Debility/deconditioning: PT/OT recommend home health on discharge     Discharge Diagnoses:  Principal Problem:   Flash pulmonary edema (HCC) Active Problems:   Acute respiratory failure with hypoxia ([place])   Diabetic peripheral neuropathy associated with type 2 diabetes mellitus (HCC)   AKI (acute kidney injury) ([place])   Unspecified atrial fibrillation (HCC)   DM (diabetes mellitus), type 2 ([place])   Acute systolic heart failure [facility])    Discharge Instructions  Discharge Instructions     AMB Referral to Heartcare Pharm-D   Complete by: As directed    Reason For Referral: Lipids   Amb Referral to Cardiac Rehabilitation   Complete by: As directed    To [facility]   Diagnosis: NSTEMI   After initial evaluation and assessments completed: Virtual Based Care may be provided alone or in conjunction with Phase 2 Cardiac Rehab based on patient barriers.: Yes   Diet - low sodium heart healthy   Complete by: As directed    Discharge instructions   Complete by: As directed  1)Please take prescribed medications as instructed 2)Follow up with cardiology on February 3 3)Follow up with your PCP in 2 weeks.  Do a thyroid function test in 4 to 6 weeks   Increase activity slowly   Complete by: As directed       Allergies as of 05/23/2021   No Known Allergies      Medication  List     STOP taking these medications    ibuprofen 200 MG tablet Commonly known as: ADVIL   lidocaine 5 % ointment Commonly known as: XYLOCAINE   metFORMIN 500 MG tablet Commonly known as: GLUCOPHAGE   naproxen 500 MG tablet Commonly known as: NAPROSYN       TAKE these medications    acetaminophen 325 MG tablet Commonly known as: Tylenol Take 2 tablets (650 mg total) by mouth every 6 (six) hours as needed.   apixaban 5 MG Tabs tablet Commonly known as: ELIQUIS Take 1 tablet (5 mg total) by mouth 2 (two) times daily.   aspirin 81 MG EC tablet Take 1 tablet (81 mg total) by mouth daily. Swallow whole.   atorvastatin 80 MG tablet Commonly known as: LIPITOR Take 1 tablet (80 mg total) by mouth daily. Start taking on: May 24, 2021   dapagliflozin propanediol 10 MG Tabs tablet Commonly known as: FARXIGA Take 1 tablet (10 mg total) by mouth daily. Start taking on: May 24, 2021   digoxin 0.125 MG tablet Commonly known as: LANOXIN Take 1 tablet (0.125 mg total) by mouth daily. Start taking on: May 24, 2021   indomethacin 25 MG capsule Commonly known as: INDOCIN Take 25 mg by mouth 2 (two) times daily as needed (gout flares).   levothyroxine 75 MCG tablet Commonly known as: SYNTHROID Take 1 tablet (75 mcg total) by mouth daily at 6 (six) AM. Start taking on: May 24, 2021   metoprolol succinate 25 MG 24 hr tablet Commonly known as: TOPROL-XL Take 1/2 tablet (12.5 mg total) by mouth daily. Start taking on: May 24, 2021        Follow-up Information     Jeannene Patella, [place]. Go on 05/31/2021.   Specialty: Physician Assistant Why: Hospital follow up apt at 1:30 Contact information: [address] [place] [address] [facility]. Go to.   Specialty: Cardiology Why: Friday, February 3 @ 11AM for [facility] within [place]. Bring all medications with  you. FREE valet parking at [employer], off [address]. at [facility] information: [address] [id number] [place] [phone]               No Known Allergies  Consultations: Cardiology   Procedures/Studies: CARDIAC CATHETERIZATION  Result Date: 05/21/2021   Prox LAD to Mid LAD lesion is 75% stenosed.   Dist LAD lesion is 80% stenosed.   1st Diag lesion is 80% stenosed.   Ost RCA to Mid RCA lesion is 99% stenosed.   2nd Mrg lesion is 99% stenosed.   Dist Cx lesion is 90% stenosed.   Prox Cx to Mid Cx lesion is 75% stenosed.   LV end diastolic pressure is normal.   There is no aortic valve stenosis. Severe, multivessel coronary artery disease.  Normal LVEDP. Given diffuse, distal vessel disease, there are no good options for revascularization.  Plan for medical therapy to try to improve his LV function.  Probable heart failure consultation as well.   DG Chest Portable 1 View  Result Date: 05/19/2021 CLINICAL DATA:  Shortness of breath code STEMI EXAM: PORTABLE CHEST 1 VIEW COMPARISON:  09/16/2017 FINDINGS: Diffuse interstitial opacity, suspicious for edema. No pleural effusion. Borderline cardiomegaly. No pneumothorax IMPRESSION: Moderate diffuse interstitial opacity is suspicious for pulmonary edema. Electronically Signed   By: Donavan Foil M.D.   On: 05/19/2021 18:52   ECHOCARDIOGRAM COMPLETE  Result Date: 05/20/2021    ECHOCARDIOGRAM REPORT   Patient Name:   Antonio Cervantes Date of Exam: 05/20/2021 Medical Rec #:  [id number]         Height:       65.0 in Accession #:    [id number]        Weight:       180.0 lb Date of Birth:  1955-05-12        BSA:          1.892 m Patient Age:    82 years          BP:           113/81 mmHg Patient Gender: M                 HR:           78 bpm. Exam Location:  Inpatient Procedure: 2D Echo, 3D Echo, Cardiac Doppler and Color Doppler REPORT CONTAINS CRITICAL RESULT Indications:     I50.40* Unspecified combined systolic (congestive) and diastolic                 (congestive) heart failure  History:        Patient has no prior history of Echocardiogram examinations.                 Abnormal ECG, Arrythmias:Atrial Fibrillation,                 Signs/Symptoms:Shortness of Breath and Dyspnea; Risk                 Factors:Diabetes. Pulmoary edema.  Sonographer:    Roseanna Rainbow RDCS Referring Phys: [place]  1. Septal apical mid/distal anterior wall and inferior apex akinetic suggesting LAD infarct. Left ventricular ejection fraction, by estimation, is 20 to 25%. The left ventricle has severely decreased function. The left ventricle demonstrates regional wall motion abnormalities (see scoring diagram/findings for description). The left ventricular internal cavity size was severely dilated. There is mild left ventricular hypertrophy. Left ventricular diastolic parameters were normal.  2. Right ventricular systolic function is normal. The right ventricular size is normal. There is normal pulmonary artery systolic pressure.  3. The mitral valve is normal in structure. Mild mitral valve regurgitation. No evidence of mitral stenosis.  4. Degree of AS hard to judge due to severe decrease in LV function DVI 0.55 AVA 2 cm2 but morphology and gradients suggest AS could be moderate . The aortic valve is normal in structure. There is moderate calcification of the aortic valve. There is moderate thickening of the aortic valve. Aortic valve regurgitation is mild. Aortic valve sclerosis/calcification is present, without any evidence of aortic stenosis.  5. The inferior vena cava is normal in size with greater than 50% respiratory variability, suggesting right atrial pressure of 3 mmHg. FINDINGS  Left Ventricle: Septal apical mid/distal anterior wall and inferior apex akinetic suggesting LAD infarct. Left ventricular ejection fraction, by estimation, is 20 to 25%. The left ventricle has severely  decreased function. The left ventricle  demonstrates regional wall motion abnormalities. The left ventricular internal cavity size was severely dilated. There is mild left ventricular hypertrophy. Left ventricular diastolic parameters were normal. Right Ventricle: The right ventricular size is normal. No increase in right ventricular wall thickness. Right ventricular systolic function is normal. There is normal pulmonary artery systolic pressure. The tricuspid regurgitant velocity is 2.35 m/s, and  with an assumed right atrial pressure of 3 mmHg, the estimated right ventricular systolic pressure is [id number] mmHg. Left Atrium: Left atrial size was normal in size. Right Atrium: Right atrial size was normal in size. Pericardium: There is no evidence of pericardial effusion. Mitral Valve: The mitral valve is normal in structure. Mild mitral valve regurgitation. No evidence of mitral valve stenosis. Tricuspid Valve: The tricuspid valve is normal in structure. Tricuspid valve regurgitation is not demonstrated. No evidence of tricuspid stenosis. Aortic Valve: Degree of AS hard to judge due to severe decrease in LV function DVI 0.55 AVA 2 cm2 but morphology and gradients suggest AS could be moderate. The aortic valve is normal in structure. There is moderate calcification of the aortic valve. There is moderate thickening of the aortic valve. Aortic valve regurgitation is mild. Aortic regurgitation PHT measures 358 msec. Aortic valve sclerosis/calcification is present, without any evidence of aortic stenosis. Aortic valve mean gradient measures 10.0 mmHg. Aortic valve peak gradient measures 17.5 mmHg. Aortic valve area, by VTI measures 1.74 cm. Pulmonic Valve: The pulmonic valve was normal in structure. Pulmonic valve regurgitation is not visualized. No evidence of pulmonic stenosis. Aorta: The aortic root is normal in size and structure. Venous: The inferior vena cava is normal in size with greater than 50% respiratory  variability, suggesting right atrial pressure of 3 mmHg. IAS/Shunts: No atrial level shunt detected by color flow Doppler.  LEFT VENTRICLE PLAX 2D LVIDd:         5.10 cm     Diastology LVIDs:         4.30 cm     LV e' medial:    4.88 cm/s LV PW:         1.00 cm     LV E/e' medial:  13.9 LV IVS:        1.10 cm     LV e' lateral:   7.13 cm/s LVOT diam:     2.00 cm     LV E/e' lateral: 9.5 LV SV:         69 LV SV Index:   37 LVOT Area:     3.14 cm  LV Volumes (MOD) LV vol d, MOD A2C: 84.4 ml LV vol d, MOD A4C: 95.3 ml LV vol s, MOD A2C: 58.3 ml LV vol s, MOD A4C: 74.3 ml LV SV MOD A2C:     26.1 ml LV SV MOD A4C:     95.3 ml LV SV MOD BP:      27.1 ml RIGHT VENTRICLE            IVC RV S prime:     6.68 cm/s  IVC diam: 1.90 cm TAPSE (M-mode): 1.1 cm LEFT ATRIUM           Index        RIGHT ATRIUM           Index LA diam:      3.60 cm 1.90 cm/m   RA Area:     11.40 cm LA Vol (A2C): 31.2 ml 16.49 ml/m  RA Volume:   23.60 ml  12.48 ml/m LA  Vol (A4C): 25.6 ml 13.53 ml/m  AORTIC VALVE AV Area (Vmax):    2.06 cm AV Area (Vmean):   1.99 cm AV Area (VTI):     1.74 cm AV Vmax:           209.40 cm/s AV Vmean:          146.200 cm/s AV VTI:            0.400 m AV Peak Grad:      17.5 mmHg AV Mean Grad:      10.0 mmHg LVOT Vmax:         137.00 cm/s LVOT Vmean:        92.700 cm/s LVOT VTI:          0.221 m LVOT/AV VTI ratio: 0.55 AI PHT:            358 msec  AORTA Ao Root diam: 3.20 cm Ao Asc diam:  3.30 cm MITRAL VALVE               TRICUSPID VALVE MV Area (PHT): 4.52 cm    TR Peak grad:   22.1 mmHg MV Decel Time: 168 msec    TR Vmax:        235.00 cm/s MV E velocity: 67.83 cm/s MV A velocity: 73.13 cm/s  SHUNTS MV E/A ratio:  0.93        Systemic VTI:  0.22 m                            Systemic Diam: 2.00 cm Jenkins Rouge MD Electronically signed by Jenkins Rouge MD Signature Date/Time: 05/20/2021/4:39:12 PM    Final       Subjective: Patient seen and examined at the bedside this morning.  Hemodynamically stable for  discharge today  Discharge Exam: Vitals:   05/23/21 0800 05/23/21 1155  BP: 114/75 106/67  Pulse: 72 69  Resp: 15 16  Temp: 98.6 F (37 C) 98.1 F (36.7 C)  SpO2: 100%    Vitals:   05/22/21 2006 05/23/21 0547 05/23/21 0800 05/23/21 1155  BP: 111/64 107/61 114/75 106/67  Pulse: 66 65 72 69  Resp: 16 16 15 16   Temp: 97.9 F (36.6 C) 98.2 F (36.8 C) 98.6 F (37 C) 98.1 F (36.7 C)  TempSrc: Oral Oral Oral Oral  SpO2: 98% 100% 100%   Weight:  74.8 kg    Height:        General: Pt is alert, awake, not in acute distress Cardiovascular: RRR, S1/S2 +, no rubs, no gallops Respiratory: CTA bilaterally, no wheezing, no rhonchi Abdominal: Soft, NT, ND, bowel sounds + Extremities: no edema, no cyanosis    The results of significant diagnostics from this hospitalization (including imaging, microbiology, ancillary and laboratory) are listed below for reference.     Microbiology: Recent Results (from the past 240 hour(s))  Resp Panel by RT-PCR (Flu A&B, Covid) Nasopharyngeal Swab     Status: None   Collection Time: 05/19/21  6:34 PM   Specimen: Nasopharyngeal Swab; Nasopharyngeal(NP) swabs in vial transport medium  Result Value Ref Range Status   SARS Coronavirus 2 by RT PCR NEGATIVE NEGATIVE Final    Comment: (NOTE) SARS-CoV-2 target nucleic acids are NOT DETECTED.  The SARS-CoV-2 RNA is generally detectable in upper respiratory specimens during the acute phase of infection. The lowest concentration of SARS-CoV-2 viral copies this assay can detect is 138 copies/mL. A negative result does not preclude SARS-Cov-2  infection and should not be used as the sole basis for treatment or other patient management decisions. A negative result may occur with  improper specimen collection/handling, submission of specimen other than nasopharyngeal swab, presence of viral mutation(s) within the areas targeted by this assay, and inadequate number of viral copies(<138 copies/mL). A  negative result must be combined with clinical observations, patient history, and epidemiological information. The expected result is Negative.  Fact Sheet for Patients:  EntrepreneurPulse.com.au  Fact Sheet for Healthcare Providers:  IncredibleEmployment.be  This test is no t yet approved or cleared by the [place] FDA and  has been authorized for detection and/or diagnosis of SARS-CoV-2 by FDA under an Emergency Use Authorization (EUA). This EUA will remain  in effect (meaning this test can be used) for the duration of the COVID-19 declaration under Section 564(b)(1) of the Act, 21 U.S.C.section 360bbb-3(b)(1), unless the authorization is terminated  or revoked sooner.       Influenza A by PCR NEGATIVE NEGATIVE Final   Influenza B by PCR NEGATIVE NEGATIVE Final    Comment: (NOTE) The Xpert Xpress SARS-CoV-2/FLU/RSV plus assay is intended as an aid in the diagnosis of influenza from Nasopharyngeal swab specimens and should not be used as a sole basis for treatment. Nasal washings and aspirates are unacceptable for Xpert Xpress SARS-CoV-2/FLU/RSV testing.  Fact Sheet for Patients: EntrepreneurPulse.com.au  Fact Sheet for Healthcare Providers: IncredibleEmployment.be  This test is not yet approved or cleared by the [place] FDA and has been authorized for detection and/or diagnosis of SARS-CoV-2 by FDA under an Emergency Use Authorization (EUA). This EUA will remain in effect (meaning this test can be used) for the duration of the COVID-19 declaration under Section 564(b)(1) of the Act, 21 U.S.C. section 360bbb-3(b)(1), unless the authorization is terminated or revoked.  Performed at [facility], [place] [address], [place], [place] [address]      Labs: BNP (last 3 results) Recent Labs    05/19/21 1847  BNP [id number]*   Basic Metabolic Panel: Recent Labs  Lab 05/19/21 1832  05/19/21 1847 05/20/21 0340 05/21/21 0101 05/21/21 1617 05/21/21 1622 05/22/21 0415 05/23/21 0547  NA 130* 133* 134* 130* 134* 135   134* 130* 134*  K 4.4 4.6 4.5 4.2 4.1 4.1   4.1 4.2 4.0  CL 103 103 105 99  --   --  99 99  CO2 18*  --  22 20*  --   --  23 26  GLUCOSE 283* 274* 118* 104*  --   --  93 122*  BUN 15 16 18 21   --   --  20 21  CREATININE 1.40* 1.30* 1.48* 1.60*  --   --  1.58* 1.54*  CALCIUM 8.4*  --  8.3* 8.6*  --   --  8.9 9.2  MG  --   --  1.7  --   --   --  1.7  --    Liver Function Tests: Recent Labs  Lab 05/19/21 1832 05/20/21 0340 05/22/21 0415  AST 16 52* 30  ALT 11 14 12   ALKPHOS 50 44 51  BILITOT 0.5 0.7 0.6  PROT 6.9 6.2* 6.6  ALBUMIN 3.5 3.2* 3.3*   No results for input(s): LIPASE, AMYLASE in the last 168 hours. No results for input(s): AMMONIA in the last 168 hours. CBC: Recent Labs  Lab 05/19/21 1832 05/19/21 1847 05/20/21 0340 05/20/21 1420 05/21/21 0101 05/21/21 1617 05/21/21 1622 05/22/21 0415  WBC 13.2*  --  9.9 9.5 9.8  --   --  9.9  NEUTROABS 7.4  --   --  6.5  --   --   --   --   HGB 13.4   < > 12.0* 11.3* 11.7* 12.6* 12.6*   12.9* 12.5*  HCT 42.3   < > 35.9* 34.1* 35.2* 37.0* 37.0*   38.0* 35.4*  MCV 95.5  --  90.9 92.4 90.5  --   --  89.6  PLT 285  --  257 242 239  --   --  261   < > = values in this interval not displayed.   Cardiac Enzymes: No results for input(s): CKTOTAL, CKMB, CKMBINDEX, TROPONINI in the last 168 hours. BNP: Invalid input(s): POCBNP CBG: Recent Labs  Lab 05/22/21 1138 05/22/21 1624 05/22/21 2004 05/23/21 0813 05/23/21 1126  GLUCAP 117* 108* 160* 143* 126*   D-Dimer No results for input(s): DDIMER in the last 72 hours. Hgb A1c Recent Labs    05/20/21 1420  HGBA1C 7.3*   Lipid Profile No results for input(s): CHOL, HDL, LDLCALC, TRIG, CHOLHDL, LDLDIRECT in the last 72 hours. Thyroid function studies Recent Labs    05/20/21 2231  TSH 108.608*  T3FREE 1.9*   Anemia work up No  results for input(s): VITAMINB12, FOLATE, FERRITIN, TIBC, IRON, RETICCTPCT in the last 72 hours. Urinalysis    Component Value Date/Time   COLORURINE YELLOW 07/14/2016 0551   APPEARANCEUR CLEAR 07/14/2016 0551   LABSPEC 1.010 07/14/2016 0551   PHURINE 5.5 07/14/2016 0551   GLUCOSEU >=500 (A) 07/14/2016 0551   HGBUR NEGATIVE 07/14/2016 0551   BILIRUBINUR NEGATIVE 07/14/2016 0551   KETONESUR 15 (A) 07/14/2016 0551   PROTEINUR NEGATIVE 07/14/2016 0551   NITRITE NEGATIVE 07/14/2016 0551   LEUKOCYTESUR NEGATIVE 07/14/2016 0551   Sepsis Labs Invalid input(s): PROCALCITONIN,  WBC,  LACTICIDVEN Microbiology Recent Results (from the past 240 hour(s))  Resp Panel by RT-PCR (Flu A&B, Covid) Nasopharyngeal Swab     Status: None   Collection Time: 05/19/21  6:34 PM   Specimen: Nasopharyngeal Swab; Nasopharyngeal(NP) swabs in vial transport medium  Result Value Ref Range Status   SARS Coronavirus 2 by RT PCR NEGATIVE NEGATIVE Final    Comment: (NOTE) SARS-CoV-2 target nucleic acids are NOT DETECTED.  The SARS-CoV-2 RNA is generally detectable in upper respiratory specimens during the acute phase of infection. The lowest concentration of SARS-CoV-2 viral copies this assay can detect is 138 copies/mL. A negative result does not preclude SARS-Cov-2 infection and should not be used as the sole basis for treatment or other patient management decisions. A negative result may occur with  improper specimen collection/handling, submission of specimen other than nasopharyngeal swab, presence of viral mutation(s) within the areas targeted by this assay, and inadequate number of viral copies(<138 copies/mL). A negative result must be combined with clinical observations, patient history, and epidemiological information. The expected result is Negative.  Fact Sheet for Patients:  EntrepreneurPulse.com.au  Fact Sheet for Healthcare Providers:   IncredibleEmployment.be  This test is no t yet approved or cleared by the [place] FDA and  has been authorized for detection and/or diagnosis of SARS-CoV-2 by FDA under an Emergency Use Authorization (EUA). This EUA will remain  in effect (meaning this test can be used) for the duration of the COVID-19 declaration under Section 564(b)(1) of the Act, 21 U.S.C.section 360bbb-3(b)(1), unless the authorization is terminated  or revoked sooner.       Influenza A by PCR NEGATIVE NEGATIVE Final  Influenza B by PCR NEGATIVE NEGATIVE Final    Comment: (NOTE) The Xpert Xpress SARS-CoV-2/FLU/RSV plus assay is intended as an aid in the diagnosis of influenza from Nasopharyngeal swab specimens and should not be used as a sole basis for treatment. Nasal washings and aspirates are unacceptable for Xpert Xpress SARS-CoV-2/FLU/RSV testing.  Fact Sheet for Patients: EntrepreneurPulse.com.au  Fact Sheet for Healthcare Providers: IncredibleEmployment.be  This test is not yet approved or cleared by the [place] FDA and has been authorized for detection and/or diagnosis of SARS-CoV-2 by FDA under an Emergency Use Authorization (EUA). This EUA will remain in effect (meaning this test can be used) for the duration of the COVID-19 declaration under Section 564(b)(1) of the Act, 21 U.S.C. section 360bbb-3(b)(1), unless the authorization is terminated or revoked.  Performed at [facility], [place] [address], [place], [place] [address]     Please note: You were cared for by a hospitalist during your hospital stay. Once you are discharged, your primary care physician will handle any further medical issues. Please note that NO REFILLS for any discharge medications will be authorized once you are discharged, as it is imperative that you return to your primary care physician (or establish a relationship with a primary care physician if  you do not have one) for your post hospital discharge needs so that they can reassess your need for medications and monitor your lab values.    Time coordinating discharge: 40 minutes  SIGNED:   Shelly Coss, MD  [facility] 05/23/2021, 12:49 PM Pager [phone]  If 7PM-7AM, please contact night-coverage [email or web address] Password [email or web address]

## 2021-05-23 NOTE — Progress Notes (Signed)
Heart Failure Stewardship Pharmacist Progress Note   PCP: Jeannene Patella, PA PCP-Cardiologist: None    HPI:  66 yo M with PMH of T2DM and peripheral neuropathy. He presented to the ED on 1/22 with shortness of breath and chest burning (not painful). CXR suspicious of pulmonary edema. An ECHO was done on 1/23 and LVEF is 20-25%. R/LHC on 1/24 with multivessel CAD (medical management), PA 17, PCWP 6, CO 5.4.   Current HF Medications: Beta Blocker: metoprolol XL 12.5 mg daily SGLT2i: Farxiga 10 mg daily Other: digoxin 0.125 mg daily  Prior to admission HF Medications: None  Pertinent Lab Values: Serum creatinine 1.54, BUN 21, Potassium 4.0, Sodium 134, BNP 814.9, Magnesium 1.7, A1c 7.3, Digoxin due in 1 week   Vital Signs: Weight: 164 lbs (admission weight: 167 lbs) Blood pressure: 110/70s  Heart rate: 70s  I/O: -1.7L yesterday; net -4.1L  Medication Assistance / Insurance Benefits Check: Does the patient have prescription insurance?  Yes Type of insurance plan: [facility] Medicare  Outpatient Pharmacy:  [facility] Is the patient willing to use [facility] at discharge? Yes Is the patient willing to transition their outpatient pharmacy to utilize a [facility] outpatient pharmacy?   Pending   Assessment: 1. 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 NYHA class II symptoms. - Continue metoprolol XL 12.5 mg daily - No ACE/ARB/ARNI or spironolactone with Cr bump, stable today  - Continue Farxiga 10 mg daily - Continue digoxin 0.125 mg daily - level due in 1 week   Plan: 1) Medication changes recommended at this time: - None  2) Patient assistance: - Entresto copay $47 per month - Farxiga copay $47 per month - Can help enroll in pt assistance if copays are unaffordable  3)  Education  - To be completed prior to discharge  Kerby Nora, PharmD, BCPS Heart Failure Cytogeneticist Phone [phone]

## 2021-05-23 NOTE — Progress Notes (Signed)
Occupational Therapy Treatment Patient Details Name: Antonio Cervantes MRN: [id number] DOB: 08-02-1955 Today's Date: 05/23/2021   History of present illness Pt is a 66 y/o M presenting to ED on 1/22 with respiratory distress, 1-2 months of progressive exertional dyspnea. CXR significant for pulmonary edema, on IV lasix. Found to have NSTEMI, A fib with RVR and increased troponins. PMH significant for DMII and tobacco use.   OT comments  Pt reported they are going to be able to have son come during the day to assist as needed in the home and IADls. Pt was able to complete LB dressing while sitting at EOB with supervision to pace self with activity, pt able to complete 2 intervals of community mobility with supervision and no reports in changes in SOB or pain. Pt continued educated on compensations with the return to home as he normally assists mother  with IADLS. Pt currently with functional limitations due to the deficits listed below (see OT Problem List).  Pt will benefit from skilled OT to increase their safety and independence with ADL and functional mobility for ADL to facilitate discharge to venue listed below.     Recommendations for follow up therapy are one component of a multi-disciplinary discharge planning process, led by the attending physician.  Recommendations may be updated based on patient status, additional functional criteria and insurance authorization.    Follow Up Recommendations   (OP cardiac rehab)    Assistance Recommended at Discharge Intermittent Supervision/Assistance  Patient can return home with the following  Assist for transportation;Assistance with cooking/housework   Equipment Recommendations  Tub/shower seat    Recommendations for Other Services      Precautions / Restrictions Precautions Precautions: None Restrictions Weight Bearing Restrictions: No       Mobility Bed Mobility Overal bed mobility: Modified Independent             General  bed mobility comments: able to sit EOB without external assist    Transfers Overall transfer level: Needs assistance Equipment used: None Transfers: Sit to/from Stand Sit to Stand: Supervision           General transfer comment: cues to pace self     Balance     Sitting balance-Leahy Scale: Good       Standing balance-Leahy Scale: Good                             ADL either performed or assessed with clinical judgement   ADL Overall ADL's : Needs assistance/impaired Eating/Feeding: Independent;Sitting   Grooming: Independent;Sitting   Upper Body Bathing: Supervision/ safety;Cueing for safety;Cueing for sequencing;Sitting   Lower Body Bathing: Supervison/ safety;Cueing for safety;Cueing for sequencing;Sitting/lateral leans   Upper Body Dressing : Supervision/safety;Sitting   Lower Body Dressing: Supervision/safety   Toilet Transfer: Supervision/safety   Toileting- Clothing Manipulation and Hygiene: Supervision/safety       Functional mobility during ADLs: Supervision/safety      Extremity/Trunk Assessment Upper Extremity Assessment Upper Extremity Assessment: Overall WFL for tasks assessed   Lower Extremity Assessment Lower Extremity Assessment: Defer to PT evaluation        Vision   Vision Assessment?: No apparent visual deficits Additional Comments: uses reading glassess   Perception     Praxis      Cognition Arousal/Alertness: Awake/alert Behavior During Therapy: WFL for tasks assessed/performed Overall Cognitive Status: Within Functional Limits for tasks assessed  Exercises      Shoulder Instructions       General Comments      Pertinent Vitals/ Pain       Pain Assessment Pain Assessment: No/denies pain  Home Living                                          Prior Functioning/Environment              Frequency  Min 2X/week         Progress Toward Goals  OT Goals(current goals can now be found in the care plan section)  Progress towards OT goals: Progressing toward goals  Acute Rehab OT Goals Patient Stated Goal: to go home today OT Goal Formulation: With patient Time For Goal Achievement: 06/05/21 Potential to Achieve Goals: Good ADL Goals Pt Will Perform Upper Body Dressing: with modified independence;sitting Pt Will Perform Lower Body Dressing: with modified independence;sit to/from stand Pt Will Transfer to Toilet: with modified independence;regular height toilet;ambulating Pt Will Perform Tub/Shower Transfer: with modified independence;ambulating;shower seat  Plan Discharge plan remains appropriate    Co-evaluation                 AM-PAC OT "6 Clicks" Daily Activity     Outcome Measure   Help from another person eating meals?: None Help from another person taking care of personal grooming?: None Help from another person toileting, which includes using toliet, bedpan, or urinal?: A Little Help from another person bathing (including washing, rinsing, drying)?: A Little Help from another person to put on and taking off regular upper body clothing?: A Little Help from another person to put on and taking off regular lower body clothing?: A Little 6 Click Score: 20    End of Session Equipment Utilized During Treatment: Gait belt  OT Visit Diagnosis: Unsteadiness on feet (R26.81);Other abnormalities of gait and mobility (R26.89);Muscle weakness (generalized) (M62.81)   Activity Tolerance Patient tolerated treatment well   Patient Left in chair;with call bell/phone within reach   Nurse Communication Mobility status        Time: [id number] OT Time Calculation (min): 20 min  Charges: OT General Charges $OT Visit: 1 Visit OT Treatments $Self Care/Home Management : 8-22 mins  Joeseph Amor OTR/L  Acute Rehab Services  [phone] office number [phone] pager number   Joeseph Amor 05/23/2021, 11:45 AM

## 2021-05-24 ENCOUNTER — Telehealth (HOSPITAL_COMMUNITY): Payer: Self-pay

## 2021-05-24 LAB — HEMOGLOBIN A1C
Hgb A1c MFr Bld: 6.9 % — ABNORMAL HIGH (ref 4.8–5.6)
Mean Plasma Glucose: 151.33 mg/dL

## 2021-05-24 NOTE — Telephone Encounter (Signed)
Per phase I cardiac rehab, fax cardiac rehab referral to [place] cardiac rehab. 

## 2021-05-30 ENCOUNTER — Other Ambulatory Visit (HOSPITAL_COMMUNITY): Payer: Self-pay

## 2021-05-30 DIAGNOSIS — E113399 Type 2 diabetes mellitus with moderate nonproliferative diabetic retinopathy without macular edema, unspecified eye: Secondary | ICD-10-CM | POA: Diagnosis not present

## 2021-05-30 DIAGNOSIS — H25813 Combined forms of age-related cataract, bilateral: Secondary | ICD-10-CM | POA: Diagnosis not present

## 2021-05-30 DIAGNOSIS — E079 Disorder of thyroid, unspecified: Secondary | ICD-10-CM | POA: Insufficient documentation

## 2021-05-31 ENCOUNTER — Telehealth (HOSPITAL_COMMUNITY): Payer: Self-pay | Admitting: Pharmacy Technician

## 2021-05-31 ENCOUNTER — Telehealth (HOSPITAL_COMMUNITY): Payer: Self-pay | Admitting: Surgery

## 2021-05-31 ENCOUNTER — Ambulatory Visit (HOSPITAL_COMMUNITY)
Admit: 2021-05-31 | Discharge: 2021-05-31 | Disposition: A | Discharge status: Home / Self Care | Payer: Medicare Other | Source: Ambulatory Visit | Attending: Cardiology | Admitting: Cardiology

## 2021-05-31 ENCOUNTER — Encounter (HOSPITAL_COMMUNITY): Payer: Self-pay

## 2021-05-31 ENCOUNTER — Other Ambulatory Visit: Payer: Self-pay

## 2021-05-31 VITALS — BP 100/60 | HR 75 | Wt 165.0 lb

## 2021-05-31 DIAGNOSIS — E1122 Type 2 diabetes mellitus with diabetic chronic kidney disease: Secondary | ICD-10-CM | POA: Diagnosis not present

## 2021-05-31 DIAGNOSIS — Z7984 Long term (current) use of oral hypoglycemic drugs: Secondary | ICD-10-CM | POA: Diagnosis not present

## 2021-05-31 DIAGNOSIS — N1831 Chronic kidney disease, stage 3a: Secondary | ICD-10-CM | POA: Insufficient documentation

## 2021-05-31 DIAGNOSIS — I255 Ischemic cardiomyopathy: Secondary | ICD-10-CM | POA: Diagnosis not present

## 2021-05-31 DIAGNOSIS — I252 Old myocardial infarction: Secondary | ICD-10-CM | POA: Diagnosis not present

## 2021-05-31 DIAGNOSIS — E039 Hypothyroidism, unspecified: Secondary | ICD-10-CM | POA: Insufficient documentation

## 2021-05-31 DIAGNOSIS — Z7901 Long term (current) use of anticoagulants: Secondary | ICD-10-CM | POA: Diagnosis not present

## 2021-05-31 DIAGNOSIS — I5022 Chronic systolic (congestive) heart failure: Secondary | ICD-10-CM | POA: Insufficient documentation

## 2021-05-31 DIAGNOSIS — Z79899 Other long term (current) drug therapy: Secondary | ICD-10-CM | POA: Insufficient documentation

## 2021-05-31 DIAGNOSIS — E785 Hyperlipidemia, unspecified: Secondary | ICD-10-CM | POA: Insufficient documentation

## 2021-05-31 DIAGNOSIS — Z7982 Long term (current) use of aspirin: Secondary | ICD-10-CM | POA: Diagnosis not present

## 2021-05-31 DIAGNOSIS — I2510 Atherosclerotic heart disease of native coronary artery without angina pectoris: Secondary | ICD-10-CM | POA: Diagnosis not present

## 2021-05-31 DIAGNOSIS — I480 Paroxysmal atrial fibrillation: Secondary | ICD-10-CM | POA: Insufficient documentation

## 2021-05-31 DIAGNOSIS — Z09 Encounter for follow-up examination after completed treatment for conditions other than malignant neoplasm: Secondary | ICD-10-CM | POA: Diagnosis not present

## 2021-05-31 DIAGNOSIS — Z87891 Personal history of nicotine dependence: Secondary | ICD-10-CM | POA: Diagnosis not present

## 2021-05-31 DIAGNOSIS — I5023 Acute on chronic systolic (congestive) heart failure: Secondary | ICD-10-CM

## 2021-05-31 LAB — DIGOXIN LEVEL: Digoxin Level: 0.9 ng/mL (ref 0.8–2.0)

## 2021-05-31 LAB — BASIC METABOLIC PANEL
Anion gap: 13 (ref 5–15)
BUN: 26 mg/dL — ABNORMAL HIGH (ref 8–23)
CO2: 23 mmol/L (ref 22–32)
Calcium: 10.2 mg/dL (ref 8.9–10.3)
Chloride: 102 mmol/L (ref 98–111)
Creatinine, Ser: 1.51 mg/dL — ABNORMAL HIGH (ref 0.61–1.24)
GFR, Estimated: 51 mL/min — ABNORMAL LOW (ref >60)
Glucose, Bld: 145 mg/dL — ABNORMAL HIGH (ref 70–99)
Potassium: 4.5 mmol/L (ref 3.5–5.1)
Sodium: 138 mmol/L (ref 135–145)

## 2021-05-31 LAB — CBC
HCT: 43.1 % (ref 39.0–52.0)
Hemoglobin: 14.5 g/dL (ref 13.0–17.0)
MCH: 30.2 pg (ref 26.0–34.0)
MCHC: 33.6 g/dL (ref 30.0–36.0)
MCV: 89.8 fL (ref 80.0–100.0)
Platelets: 324 10*3/uL (ref 150–400)
RBC: 4.8 MIL/uL (ref 4.22–5.81)
RDW: 12.8 % (ref 11.5–15.5)
WBC: 10.5 10*3/uL (ref 4.0–10.5)
nRBC: 0 % (ref 0.0–0.2)

## 2021-05-31 MED ORDER — SPIRONOLACTONE 25 MG PO TABS: 12.5000 mg | ORAL_TABLET | Freq: Every day | ORAL | 3 refills | Status: DC

## 2021-05-31 MED ORDER — APIXABAN 5 MG PO TABS: 5.0000 mg | ORAL_TABLET | Freq: Two times a day (BID) | ORAL | 1 refills | Status: DC

## 2021-05-31 MED ORDER — DIGOXIN 125 MCG PO TABS: 0.0625 mg | ORAL_TABLET | Freq: Every day | ORAL | 1 refills | Status: DC

## 2021-05-31 MED ORDER — DIGOXIN 125 MCG PO TABS: 0.1250 mg | ORAL_TABLET | Freq: Every day | ORAL | 1 refills | Status: DC

## 2021-05-31 MED ORDER — DAPAGLIFLOZIN PROPANEDIOL 10 MG PO TABS: 10.0000 mg | ORAL_TABLET | Freq: Every day | ORAL | 2 refills | Status: DC

## 2021-05-31 MED ORDER — FUROSEMIDE 20 MG PO TABS: 20.0000 mg | ORAL_TABLET | ORAL | 1 refills | Status: DC | PRN

## 2021-05-31 MED ORDER — METOPROLOL SUCCINATE ER 25 MG PO TB24: 12.5000 mg | ORAL_TABLET | Freq: Every day | ORAL | 1 refills | Status: DC

## 2021-05-31 NOTE — Telephone Encounter (Signed)
Patient called and made aware of results and recommendations per Robbie Lis PA.  I have added lab appt to next Friday and updated medication list in [facility].

## 2021-05-31 NOTE — Progress Notes (Signed)
HEART & VASCULAR TRANSITION OF CARE CONSULT NOTE     Referring Physician: Dr. Katrinka BlazingSmith  Primary Care: Remo LippsWaddell, Daniel P, PA  Primary Cardiologist: Dr. Katrinka BlazingSmith   HPI: Referred to clinic by Dr. Katrinka BlazingSmith for heart failure consultation.   66 y/o male w/ T2DM and remote tobacco use but no prior cardiac history, [facility]/ [facility] onset dysp[facility], ruled in for NSTEMI and found to be in acute CHF and afib w/ RVR of unknown duration but spontaneously converted back to NSR. HS trop peaked 1118. Echo showed severely reduced LVEF, 20-25%, RV normal. LHC showed severe MVCAD, however given diffuse, distal vessel disease, there were no good options for revascularization (Diffuse inoperable CAD, targets not amendable to PCI). RHC showed normal filling pressures and normal CO  5.4 L/min, cardiac index 2.88. Medical therapy for CAD was recommended. Placed on ASA, statin and ? blocker. GDMT for systolic heart failure was limited by soft BP and renal insuffiency, SCr ~1.5 during hospitalization (baseline unknown). In addition to metoprolol, he was placed on Farxiga and digoxin. No ARB/ARNi nor spiro. No loop diuretic prescribed at discharge. Eliquis started for Afib. [facility]   Of note, there was also concern for potential aortic stenosis base on echo interpretation, "degree of AS hard to judge due to severe decrease in LV function DVI 0.55 AVA 2 cm2 but morphology and gradients suggest AS could be moderate" .  Presents to clinic today for post hospital f/u. Here w/ family member. He is retired from [employer] business. Lives w/ his mother and helps care for her. Has been doing well post hospital. Denies CP. No further dyspnea. Able to perform basic ADLs w/o significant limitation, NYHA Class I-II. No orthopnea/PND. No LEE nor wt gain since discharge. Reports full med compliance. No side effects or intolerances. Denies abnormal bleeding w/ Eliquis. BP soft, 100/60, but no orthostatic symptoms. EKG  shows NSR w/ 1st degree AVB, 69 bpm. Denies palpitations.    Cardiac Testing  2D Echo 05/20/21 Septal apical mid/distal anterior wall and inferior apex akinetic suggesting LAD infarct. Left ventricular ejection fraction, by estimation, is 20 to 25%. The left ventricle has severely decreased function. The left ventricle demonstrates regional wall motion abnormalities (see scoring diagram/findings for description). The left ventricular internal cavity size was severely dilated. There is mild left ventricular hypertrophy. Left ventricular diastolic parameters were normal. 1. Right ventricular systolic function is normal. The right ventricular size is normal. There is normal pulmonary artery systolic pressure. 2. The mitral valve is normal in structure. Mild mitral valve regurgitation. No evidence of mitral stenosis. 3. Degree of AS hard to judge due to severe decrease in LV function DVI 0.55 AVA 2 cm2 but morphology and gradients suggest AS could be moderate . The aortic valve is normal in structure. There is moderate calcification of the aortic valve. There is moderate thickening of the aortic valve. Aortic valve regurgitation is mild. Aortic valve sclerosis/calcification is present, without any evidence of aortic stenosis. 4. The inferior vena cava is normal in size with greater than 50% respiratory variability, suggesting right atrial pressure of 3 mmHg.  LHC/RHC 05/21/21   Prox LAD to Mid LAD lesion is 75% stenosed.   Dist LAD lesion is 80% stenosed.   1st Diag lesion is 80% stenosed.   Ost RCA to Mid RCA lesion is 99% stenosed.   2nd Mrg lesion is 99% stenosed.   Dist Cx lesion is 90% stenosed.   Prox Cx to  Mid Cx lesion is 75% stenosed.   LV end diastolic pressure is normal.   There is no aortic valve stenosis.   Severe, multivessel coronary artery disease.  Normal LVEDP.   Given diffuse, distal vessel disease, there are no good options for revascularization.   Right  Heart  Right Heart Pressures Ao sat 99%, PA sat 70%, PA pressure 26/10, mean PA pressure 17 mmHg, mean pulmonary capillary wedge pressure 6 mmHg; cardiac output 5.4 L/min, cardiac index 2.88.    Review of Systems: [y] = yes, [ ]  = no   General: Weight gain [ ] ; Weight loss [ ] ; Anorexia [ ] ; Fatigue [ ] ; Fever [ ] ; Chills [ ] ; Weakness [ ]   Cardiac: Chest pain/pressure [ ] ; Resting SOB [ ] ; Exertional SOB [ ] ; Orthopnea [ ] ; Pedal Edema [ ] ; Palpitations [ ] ; Syncope [ ] ; Presyncope [ ] ; Paroxysmal nocturnal dyspnea[ ]   Pulmonary: Cough [ ] ; Wheezing[ ] ; Hemoptysis[ ] ; Sputum [ ] ; Snoring [ ]   GI: Vomiting[ ] ; Dysphagia[ ] ; Melena[ ] ; Hematochezia [ ] ; Heartburn[ ] ; Abdominal pain [ ] ; Constipation [ ] ; Diarrhea [ ] ; BRBPR [ ]   GU: Hematuria[ ] ; Dysuria [ ] ; Nocturia[ ]   Vascular: Pain in legs with walking [ ] ; Pain in feet with lying flat [ ] ; Non-healing sores [ ] ; Stroke [ ] ; TIA [ ] ; Slurred speech [ ] ;  Neuro: Headaches[ ] ; Vertigo[ ] ; Seizures[ ] ; Paresthesias[ ] ;Blurred vision [ ] ; Diplopia [ ] ; Vision changes [ ]   Ortho/Skin: Arthritis [ ] ; Joint pain [ ] ; Muscle pain [ ] ; Joint swelling [ ] ; Back Pain [ ] ; Rash [ ]   Psych: Depression[ ] ; Anxiety[ ]   Heme: Bleeding problems [ ] ; Clotting disorders [ ] ; Anemia [ ]   Endocrine: Diabetes [ Y]; Thyroid dysfunction[Y ]   Past Medical History:  Diagnosis Date   Diabetes mellitus without complication ([place])    Diabetic peripheral neuropathy associated with type 2 diabetes mellitus ([facility])     Current Outpatient Medications  Medication Sig Dispense Refill   acetaminophen (TYLENOL) 325 MG tablet Take 2 tablets (650 mg total) by mouth every 6 (six) hours as needed. 30 tablet 0   apixaban (ELIQUIS) 5 MG TABS tablet Take 1 tablet (5 mg total) by mouth 2 (two) times daily. 60 tablet 0   aspirin 81 MG EC tablet Take 1 tablet (81 mg total) by mouth daily. Swallow whole. 30 tablet 11   atorvastatin (LIPITOR) 80 MG tablet Take 1 tablet (80 mg  total) by mouth daily. 30 tablet 0   dapagliflozin propanediol (FARXIGA) 10 MG TABS tablet Take 1 tablet (10 mg total) by mouth daily. 30 tablet 0   digoxin (LANOXIN) 0.125 MG tablet Take 1 tablet (0.125 mg total) by mouth daily. 30 tablet 0   furosemide (LASIX) 20 MG tablet Take 1 tablet (20 mg total) by mouth as needed. 30 tablet 1   indomethacin (INDOCIN) 25 MG capsule Take 25 mg by mouth 2 (two) times daily as needed (gout flares).     levothyroxine (SYNTHROID) 75 MCG tablet Take 1 tablet (75 mcg total) by mouth daily at 6 (six) AM. 30 tablet 0   metoprolol succinate (TOPROL-XL) 25 MG 24 hr tablet Take 1/2 tablet (12.5 mg total) by mouth daily. 30 tablet 0   No current facility-administered medications for this encounter.    No Known Allergies    Social History   Socioeconomic History   Marital status: Married    Spouse name: Not on file  Number of children: 1   Years of education: Not on file   Highest education level: 10th grade  Occupational History   Occupation: retired  Tobacco Use   Smoking status: Former    Packs/day: 1.50    Years: 15.00    Pack years: 22.50    Types: Cigarettes    Quit date: 1983    Years since quitting: 40.1   Smokeless tobacco: Never  Vaping Use   Vaping Use: Never used  Substance and Sexual Activity   Alcohol use: No   Drug use: No   Sexual activity: Not on file  Other Topics Concern   Not on file  Social History Narrative   Not on file   Social Determinants of Health   Financial Resource Strain: Low Risk    Difficulty of Paying Living Expenses: Not very hard  Food Insecurity: No Food Insecurity   Worried About Charity fundraiser in the Last Year: Never true   [place] in the Last Year: Never true  Transportation Needs: No Transportation Needs   Lack of Transportation (Medical): No   Lack of Transportation (Non-Medical): No  Physical Activity: Not on file  Stress: Not on file  Social Connections: Not on file  Intimate  Partner Violence: Not on file     History reviewed. No pertinent family history.  Vitals:   05/31/21 1104  BP: 100/60  Pulse: 75  SpO2: 99%  Weight: 74.8 kg (165 lb)    PHYSICAL EXAM: General:  Well appearing. No respiratory difficulty HEENT: normal Neck: supple. no JVD. Carotids 2+ bilat; no bruits. No lymphadenopathy or thryomegaly appreciated. Cor: PMI nondisplaced. Regular rate & rhythm. No rubs, gallops or murmurs. Lungs: clear Abdomen: soft, nontender, nondistended. No hepatosplenomegaly. No bruits or masses. Good bowel sounds. Extremities: no cyanosis, clubbing, rash, edema Neuro: alert & oriented x 3, cranial nerves grossly intact. moves all 4 extremities w/o difficulty. Affect pleasant.  ECG: NSR w/ 1st degree AVB 69 bpm    ASSESSMENT & PLAN:  1. 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 Echo 1/23 EF 20-25%, RV ok - LHC w/ severe MVCAD. RHC w/ normal filling pressures and preserved CO 5.4 L/min, cardiac index 2.88 - NYHA Class II. Euvolemic on exam - Continue Farxiga 10 mg daily  - BP too soft for ARNi/ARB currently  - Add Spiro 12.5 mg qhs - Continue Toprol XL 12.5 mg daily  - Continue Digoxin 0.125 mg daily. Check Dig level today  - Add Lasix 20 mg PRN  - Check BMP today and again in 7 days  - Refer to [facility] for ongoing management/observation. With un-revascularized multivessel CAD, worry about long term trajectory of HF. Will need to follow for potential need for advanced therapies in the future - Continue gradual GDMT titration as BP and renal fx permits - F/u in 3-4 more weeks - Plan repeat echo 3 months after meds fully optimized. EP referral for ICD if EF remains < 35%  - Discussed continuation of daily wts, low sodium diet and fluid restriction    2. CAD - NSTEMI 1/23 - LHC w/ severe, diffuse MVCAD - Medical Management (Diffuse inoperable CAD, targets not amendable to PCI) - Stable w/o CP - Continue ASA 81 mg daily - Atorvastatin 80 mg daily  -  Toprol XL 12.5 mg daily   3. PAF - Afib w/ RVR during recent admit (new) w/ spontaneous conversion to NSR - NSR on EKG today. Well rate controlled w/ ?  blocker - Eliquis for a/c. Denies abnormal bleeding. CBC today  - Continue Toprol XL 12.5 mg daily   4. ? Aortic Stenosis  - ? Mod AS on echo 1/23 (degree of AS hard to judge due to severe decrease in LV function DVI 0.55 AVA 2 cm2 but morphology and gradients suggest AS could be moderate) - will follow, repeat echo in 3 months  5. CKD IIIa - Baseline SCr ~1.5 - BMP today and again in 7 days w/ spiro addition  - Continue Farxiga 10 mg daily   6. Type 2DM - Hgb A1c 7.3  - Farxiga 10 mg   7. HLD w/ LDL Goal < 70  - LP 1/23 LDL 148 - now on high intensity statin, Atorva 80 mg - needs repeat FLP and HFTs in 6-8 weeks  8. Remote Tobacco Use - quit 40 years ago  9. Hypothyroidism  - on levothyroxine - management per PCP    NYHA II GDMT  Diuretic- Lasix 20 mg PRN BB- Toprol XL 12.5 mg daily  Ace/ARB/ARNI Not yet. BP too soft  MRA Adding Spiro 12.5 mg qhs  SGLT2i Farxiga 10 mg daily    Referred to HFSW (PCP, Medications, Transportation, ETOH Abuse, Drug Abuse, Insurance, Museum/gallery curator ): No Refer to Pharmacy: Yes  Refer to Home Health:  No Refer to Advanced Heart Failure Clinic: Yes Refer to General Cardiology:  No  Follow up  w/ the Advanced Heart Failure clinic in 3-4 weeks for further med titration. Assign to Dr. Haroldine Laws

## 2021-05-31 NOTE — Progress Notes (Signed)
Grant obtained to assist with pt [name] copay  Member ID: [id number] Group ID: [id number] RxBin ID: [id number] PCN: [id number] Eligibility Start Date: 03/02/2021 Eligibility End Date: 05/30/2022 Assistance Amount: $1,200.00  Burna Sis, LCSW Clinical Social Worker Advanced Heart Failure Clinic Desk#: [phone] Cell#: [phone]

## 2021-05-31 NOTE — Telephone Encounter (Signed)
-----   Message from Allayne Butcher, [place] sent at 05/31/2021  3:17 PM EST ----- Reduce digoxin to 0.0625. Repeat Dig level in 1 week w/ repeat BMP

## 2021-05-31 NOTE — Telephone Encounter (Signed)
Advanced Heart Failure Patient Advocate Encounter  Sent in BMS application. Patient is aware that it will initially be denied until the 3% OOP is spent.

## 2021-05-31 NOTE — Progress Notes (Signed)
Met with pt and pt dtr-in-law to assist with concern for lack of PCP and with medication expense.  Pt confirms since hospital stay he has located a PCP so no longer in need of assistance with this.  Reports concerns with paying for Farxiga and Eliquis- helped obtain PAN foundation grant for [place] and began application process for Eliquis assistance  No further needs at this time  Jorge Ny, [facility] Desk#: [phone] Cell#: [phone]

## 2021-05-31 NOTE — Patient Instructions (Signed)
Medication Changes:  Take Lasix 20 mg as needed for any leg edema or weight gain  Lab Work:  Lab work done today,we will call you with any abnormal results  Testing/Procedures:  none  Referrals: none   Special Instructions // Education:  none  Follow-Up in: 3 weeks with app clinic  At the Advanced Heart Failure Clinic, you and your health needs are our priority. We have a designated team specialized in the treatment of Heart Failure. This Care Team includes your primary Heart Failure Specialized Cardiologist (physician), Advanced Practice Providers (APPs- Physician Assistants and Nurse Practitioners), and Pharmacist who all work together to provide you with the care you need, when you need it.   You may see any of the following providers on your designated Care Team at your next follow up:  Dr Arvilla Meres Dr Carron Curie, NP Robbie Lis, [place] [facility] [place], [place] Karle Plumber, PharmD   Please be sure to bring in all your medications bottles to every appointment.   Need to Contact us:  If you have any questions or concerns before your next appointment please send [place] a message through [place] or call our office at [phone].    TO LEAVE A MESSAGE FOR THE NURSE SELECT OPTION 2, PLEASE LEAVE A MESSAGE INCLUDING: YOUR NAME DATE OF BIRTH CALL BACK NUMBER REASON FOR CALL**this is important as we prioritize the call backs  YOU WILL RECEIVE A CALL BACK THE SAME DAY AS LONG AS YOU CALL BEFORE 4:00 PM  Do the following things EVERYDAY: Weigh yourself in the morning before breakfast. Write it down and keep it in a log. Take your medicines as prescribed Eat low salt foods--Limit salt (sodium) to 2000 mg per day.  Stay as active as you can everyday Limit all fluids for the day to less than 2 liters

## 2021-06-03 ENCOUNTER — Telehealth (HOSPITAL_COMMUNITY): Payer: Self-pay | Admitting: Surgery

## 2021-06-03 NOTE — Telephone Encounter (Signed)
Patient's daughter called to clarify instructions and new prescriptions reviewed with patient on Friday.  I reviewed with her and she acknowledges understanding.

## 2021-06-07 ENCOUNTER — Ambulatory Visit (HOSPITAL_COMMUNITY)
Admission: RE | Admit: 2021-06-07 | Discharge: 2021-06-07 | Disposition: A | Discharge status: Home / Self Care | Payer: Medicare Other | Source: Ambulatory Visit | Attending: Cardiology | Admitting: Cardiology

## 2021-06-07 ENCOUNTER — Other Ambulatory Visit (HOSPITAL_BASED_OUTPATIENT_CLINIC_OR_DEPARTMENT_OTHER): Payer: Self-pay

## 2021-06-07 ENCOUNTER — Other Ambulatory Visit: Payer: Self-pay

## 2021-06-07 DIAGNOSIS — I5023 Acute on chronic systolic (congestive) heart failure: Secondary | ICD-10-CM | POA: Insufficient documentation

## 2021-06-07 LAB — BASIC METABOLIC PANEL
Anion gap: 9 (ref 5–15)
BUN: 34 mg/dL — ABNORMAL HIGH (ref 8–23)
CO2: 23 mmol/L (ref 22–32)
Calcium: 9.3 mg/dL (ref 8.9–10.3)
Chloride: 106 mmol/L (ref 98–111)
Creatinine, Ser: 1.61 mg/dL — ABNORMAL HIGH (ref 0.61–1.24)
GFR, Estimated: 47 mL/min — ABNORMAL LOW (ref >60)
Glucose, Bld: 148 mg/dL — ABNORMAL HIGH (ref 70–99)
Potassium: 4.2 mmol/L (ref 3.5–5.1)
Sodium: 138 mmol/L (ref 135–145)

## 2021-06-10 DIAGNOSIS — E782 Mixed hyperlipidemia: Secondary | ICD-10-CM | POA: Diagnosis not present

## 2021-06-10 DIAGNOSIS — M109 Gout, unspecified: Secondary | ICD-10-CM | POA: Diagnosis not present

## 2021-06-10 DIAGNOSIS — Z139 Encounter for screening, unspecified: Secondary | ICD-10-CM | POA: Diagnosis not present

## 2021-06-10 DIAGNOSIS — E039 Hypothyroidism, unspecified: Secondary | ICD-10-CM | POA: Diagnosis not present

## 2021-06-10 DIAGNOSIS — I5020 Unspecified systolic (congestive) heart failure: Secondary | ICD-10-CM | POA: Diagnosis not present

## 2021-06-10 DIAGNOSIS — I214 Non-ST elevation (NSTEMI) myocardial infarction: Secondary | ICD-10-CM | POA: Diagnosis not present

## 2021-06-10 DIAGNOSIS — I2510 Atherosclerotic heart disease of native coronary artery without angina pectoris: Secondary | ICD-10-CM | POA: Diagnosis not present

## 2021-06-10 DIAGNOSIS — Z09 Encounter for follow-up examination after completed treatment for conditions other than malignant neoplasm: Secondary | ICD-10-CM | POA: Diagnosis not present

## 2021-06-10 DIAGNOSIS — Z9181 History of falling: Secondary | ICD-10-CM | POA: Diagnosis not present

## 2021-06-10 DIAGNOSIS — E1129 Type 2 diabetes mellitus with other diabetic kidney complication: Secondary | ICD-10-CM | POA: Diagnosis not present

## 2021-06-10 DIAGNOSIS — E1165 Type 2 diabetes mellitus with hyperglycemia: Secondary | ICD-10-CM | POA: Diagnosis not present

## 2021-06-10 DIAGNOSIS — I4891 Unspecified atrial fibrillation: Secondary | ICD-10-CM | POA: Diagnosis not present

## 2021-06-11 ENCOUNTER — Other Ambulatory Visit: Payer: Self-pay

## 2021-06-11 ENCOUNTER — Other Ambulatory Visit (HOSPITAL_COMMUNITY): Payer: Self-pay

## 2021-06-11 ENCOUNTER — Ambulatory Visit: Payer: Medicare Other | Admitting: Pharmacist

## 2021-06-11 DIAGNOSIS — E785 Hyperlipidemia, unspecified: Secondary | ICD-10-CM | POA: Diagnosis not present

## 2021-06-11 DIAGNOSIS — I214 Non-ST elevation (NSTEMI) myocardial infarction: Secondary | ICD-10-CM

## 2021-06-11 NOTE — Progress Notes (Signed)
Patient ID: Antonio Cervantes                 DOB: [birth year]                    MRN: [id number]     HPI: Antonio Cervantes is a 66 y.o. male patient referred to lipid clinic by Dr. Katrinka Blazing. PMH is significant for T2DM and remote tobacco use but no prior cardiac history, recently admitted to [facility] w/ new onset dyspnea, ruled in for NSTEMI and found to be in acute CHF and afib w/ RVR of unknown duration but spontaneously converted back to NSR. HS trop peaked 1118. Echo showed severely reduced LVEF, 20-25%, RV normal. LHC showed severe MVCAD, however given diffuse, distal vessel disease, there were no good options for revascularization (Diffuse inoperable CAD, targets not amendable to PCI).   Patient started on atorvastatin post NSTEMI. He presents today to lipid clinic accompanied by his daughter and granddaughter. He is doing well on atorvastatin. Is interested in cardiac rehab. Supposed to do at [facility]. Has labs drawn at PCP office in [place] yesterday. I have requested these labs be faxed to [place].   Current Medications: atorvastatin 80mg  daily Risk Factors: CAD, DM LDL goal: <55  Diet: breakfast: cheerios, egg Lunch: nothing Dinner: fruit (grapes or apples and cheese sandwich) hibachi  Snack:  Drink: 1/2 and 1/2 tea, water  Exercise: is planning to cardiac rehab  Family History: No family history on file.  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 30 tablet 0   apixaban (ELIQUIS) 5 MG TABS tablet Take 1 tablet (5 mg total) by mouth 2 (two) times daily. 60  tablet 1   aspirin 81 MG EC tablet Take 1 tablet (81 mg total) by mouth daily. Swallow whole. 30 tablet 11   atorvastatin (LIPITOR) 80 MG tablet Take 1 tablet (80 mg total) by mouth daily. 30 tablet 0   dapagliflozin propanediol (FARXIGA) 10 MG TABS tablet Take 1 tablet (10 mg total) by mouth daily. 30 tablet 2   digoxin (LANOXIN) 0.125 MG tablet Take 0.5 tablets (0.0625 mg total) by mouth daily. 30 tablet 1   furosemide (LASIX) 20 MG tablet Take 1 tablet (20 mg total) by mouth as needed. 30 tablet 1   indomethacin (INDOCIN) 25 MG capsule Take 25 mg by mouth 2 (two) times daily as needed (gout flares).     levothyroxine (SYNTHROID) 75 MCG tablet Take 1 tablet (75 mcg total) by mouth daily at 6 (six) AM. 30 tablet 0   metoprolol succinate (TOPROL-XL) 25 MG 24 hr tablet Take 1/2 tablet (12.5 mg total) by mouth daily. 30 tablet 1   spironolactone (ALDACTONE) 25 MG tablet Take 0.5 tablets (12.5 mg total) by mouth daily. 90 tablet 3   No current facility-administered medications on file prior to visit.    No Known Allergies  Assessment/Plan:  1. Hyperlipidemia - Discussed PCSK9i and zetia with patient. Reviewed efficacy, cost and injection technique of both. Patient agreeable to trying PCSK9i. Will submit prior  authorization when labs are faxed over and then apply for health[employer] grant. Patient aware to continue atorvastatin 80mg  daily.    Thank you,   , Pharm.D, BCPS, CPP [facility]  [address], [place], [place]  Phone: [phone]; Fax: [phone]

## 2021-06-11 NOTE — Patient Instructions (Addendum)
I will submit a prior authorization for Repatha or Praluent Once approved I will apply for a healthwell grant for you  Make sure you look into cardiac rehab  Continue atorvastatin 80mg  daily  Call me at [phone] with any questions

## 2021-06-12 ENCOUNTER — Telehealth: Payer: Self-pay | Admitting: Pharmacist

## 2021-06-12 MED ORDER — EZETIMIBE 10 MG PO TABS: 10.0000 mg | ORAL_TABLET | Freq: Every day | ORAL | 3 refills | Status: DC

## 2021-06-12 NOTE — Telephone Encounter (Signed)
Labs from [facility]: 2/133/23 LDL-C 62 TG 114 Will not be able to get PCSK9i approved since LDL is <70. Will instead add zetia 10mg  daily. Called pt and he is in agreement with the plan. Continue atorvastatin 80mg  daily. Lipids in 3 months

## 2021-06-19 NOTE — Progress Notes (Incomplete)
ADVANCED HF CLINIC CONSULT NOTE  Referring Provider: Lyda Jester, PA Primary Care: Jeannene Patella, PA HF Cardiologist: Dr. Haroldine Laws  HPI: 66 y.o. male w/ T2DM and remote tobacco use but no prior cardiac history, recently admitted to [facility] w/ new onset dyspnea, ruled in for NSTEMI and found to be in acute CHF and afib w/ RVR of unknown duration but spontaneously converted back to NSR. HS trop peaked 1118. Echo showed severely reduced LVEF, 20-25%, RV normal. LHC showed severe MVCAD, however given diffuse, distal vessel disease, there were no good options for revascularization (Diffuse inoperable CAD, targets not amendable to PCI). RHC showed normal filling pressures and normal CO  5.4 L/min, cardiac index 2.88. Medical therapy for CAD was recommended. Placed on ASA, statin and ? blocker. GDMT for systolic heart failure was limited by soft BP and renal insuffiency, SCr ~1.5 during hospitalization (baseline unknown). In addition to metoprolol, he was placed on Farxiga and digoxin. No ARB/ARNi nor spiro. No loop diuretic prescribed at discharge. Eliquis started for Afib. Referred to [facility] clinic.    Of note, there was also concern for potential aortic stenosis base on echo interpretation, "degree of AS hard to judge due to severe decrease in LV function DVI 0.55 AVA 2 cm2 but morphology and gradients suggest AS could be moderate" .   Presents to clinic today for post hospital f/u. Here w/ family member. He is retired from [employer]. Lives w/ his mother and helps care for her. Has been doing well post hospital. Denies CP. No further dyspnea. Able to perform basic ADLs w/o significant limitation, NYHA Class I-II. No orthopnea/PND. No LEE nor wt gain since discharge. Reports full med compliance. No side effects or intolerances. Denies abnormal bleeding w/ Eliquis. BP soft, 100/60, but no orthostatic symptoms. EKG shows NSR w/ 1st degree AVB, 69 bpm. Denies palpitations.      Cardiac Testing  2D  Echo 05/20/21 Septal apical mid/distal anterior wall and inferior apex akinetic suggesting LAD infarct. Left ventricular ejection fraction, by estimation, is 20 to 25%. The left ventricle has severely decreased function. The left ventricle demonstrates regional wall motion abnormalities (see scoring diagram/findings for description). The left ventricular internal cavity size was severely dilated. There is mild left ventricular hypertrophy. Left ventricular diastolic parameters were normal. 1. Right ventricular systolic function is normal. The right ventricular size is normal. There is normal pulmonary artery systolic pressure. 2. The mitral valve is normal in structure. Mild mitral valve regurgitation. No evidence of mitral stenosis. 3. Degree of AS hard to judge due to severe decrease in LV function DVI 0.55 AVA 2 cm2 but morphology and gradients suggest AS could be moderate . The aortic valve is normal in structure. There is moderate calcification of the aortic valve. There is moderate thickening of the aortic valve. Aortic valve regurgitation is mild. Aortic valve sclerosis/calcification is present, without any evidence of aortic stenosis. 4. The inferior vena cava is normal in size with greater than 50% respiratory variability, suggesting right atrial pressure of 3 mmHg.   LHC/RHC 05/21/21   Prox LAD to Mid LAD lesion is 75% stenosed.   Dist LAD lesion is 80% stenosed.   1st Diag lesion is 80% stenosed.   Ost RCA to Mid RCA lesion is 99% stenosed.   2nd Mrg lesion is 99% stenosed.   Dist Cx lesion is 90% stenosed.   Prox Cx to Mid Cx lesion is 75% stenosed.   LV end diastolic pressure is normal.  There is no aortic valve stenosis.   Severe, multivessel coronary artery disease.  Normal LVEDP.   Given diffuse, distal vessel disease, there are no good options for revascularization.    Right Heart   Right Heart Pressures Ao sat 99%, PA sat 70%, PA pressure 26/10, mean PA  pressure 17 mmHg, mean pulmonary capillary wedge pressure 6 mmHg; cardiac output 5.4 L/min, cardiac index 2.88.          Review of Systems: [y] = yes, [ ]  = no   General: Weight gain [ ] ; Weight loss [ ] ; Anorexia [ ] ; Fatigue [ ] ; Fever [ ] ; Chills [ ] ; Weakness [ ]   Cardiac: Chest pain/pressure [ ] ; Resting SOB [ ] ; Exertional SOB [ ] ; Orthopnea [ ] ; Pedal Edema [ ] ; Palpitations [ ] ; Syncope [ ] ; Presyncope [ ] ; Paroxysmal nocturnal dyspnea[ ]   Pulmonary: Cough [ ] ; Wheezing[ ] ; Hemoptysis[ ] ; Sputum [ ] ; Snoring [ ]   GI: Vomiting[ ] ; Dysphagia[ ] ; Melena[ ] ; Hematochezia [ ] ; Heartburn[ ] ; Abdominal pain [ ] ; Constipation [ ] ; Diarrhea [ ] ; BRBPR [ ]   GU: Hematuria[ ] ; Dysuria [ ] ; Nocturia[ ]   Vascular: Pain in legs with walking [ ] ; Pain in feet with lying flat [ ] ; Non-healing sores [ ] ; Stroke [ ] ; TIA [ ] ; Slurred speech [ ] ;  Neuro: Headaches[ ] ; Vertigo[ ] ; Seizures[ ] ; Paresthesias[ ] ;Blurred vision [ ] ; Diplopia [ ] ; Vision changes [ ]   Ortho/Skin: Arthritis [ ] ; Joint pain [ ] ; Muscle pain [ ] ; Joint swelling [ ] ; Back Pain [ ] ; Rash [ ]   Psych: Depression[ ] ; Anxiety[ ]   Heme: Bleeding problems [ ] ; Clotting disorders [ ] ; Anemia [ ]   Endocrine: Diabetes [ ] ; Thyroid dysfunction[ ]    Past Medical History:  Diagnosis Date   Diabetes mellitus without complication (HCC)    Diabetic peripheral neuropathy associated with type 2 diabetes mellitus (HCC)     Current Outpatient Medications  Medication Sig Dispense Refill   acetaminophen (TYLENOL) 325 MG tablet Take 2 tablets (650 mg total) by mouth every 6 (six) hours as needed. 30 tablet 0   apixaban (ELIQUIS) 5 MG TABS tablet Take 1 tablet (5 mg total) by mouth 2 (two) times daily. 60 tablet 1   aspirin 81 MG EC tablet Take 1 tablet (81 mg total) by mouth daily. Swallow whole. 30 tablet 11   atorvastatin (LIPITOR) 80 MG tablet Take 1 tablet (80 mg total) by mouth daily. 30 tablet 0   dapagliflozin propanediol (FARXIGA) 10  MG TABS tablet Take 1 tablet (10 mg total) by mouth daily. 30 tablet 2   digoxin (LANOXIN) 0.125 MG tablet Take 0.5 tablets (0.0625 mg total) by mouth daily. 30 tablet 1   ezetimibe (ZETIA) 10 MG tablet Take 1 tablet (10 mg total) by mouth daily. 90 tablet 3   furosemide (LASIX) 20 MG tablet Take 1 tablet (20 mg total) by mouth as needed. 30 tablet 1   indomethacin (INDOCIN) 25 MG capsule Take 25 mg by mouth 2 (two) times daily as needed (gout flares).     levothyroxine (SYNTHROID) 75 MCG tablet Take 1 tablet (75 mcg total) by mouth daily at 6 (six) AM. 30 tablet 0   metoprolol succinate (TOPROL-XL) 25 MG 24 hr tablet Take 1/2 tablet (12.5 mg total) by mouth daily. 30 tablet 1   spironolactone (ALDACTONE) 25 MG tablet Take 0.5 tablets (12.5 mg total) by mouth daily. 90 tablet 3   No current facility-administered medications for  this visit.    No Known Allergies    Social History   Socioeconomic History   Marital status: Married    Spouse name: Not on file   Number of children: 1   Years of education: Not on file   Highest education level: 10th grade  Occupational History   Occupation: retired  Tobacco Use   Smoking status: Former    Packs/day: 1.50    Years: 15.00    Pack years: 22.50    Types: Cigarettes    Quit date: 1983    Years since quitting: 40.1   Smokeless tobacco: Never  Vaping Use   Vaping Use: Never used  Substance and Sexual Activity   Alcohol use: No   Drug use: No   Sexual activity: Not on file  Other Topics Concern   Not on file  Social History Narrative   Not on file   Social Determinants of Health   Financial Resource Strain: Low Risk    Difficulty of Paying Living Expenses: Not very hard  Food Insecurity: No Food Insecurity   Worried About Charity fundraiser in the Last Year: Never true   [place] in the Last Year: Never true  Transportation Needs: No Transportation Needs   Lack of Transportation (Medical): No   Lack of Transportation  (Non-Medical): No  Physical Activity: Not on file  Stress: Not on file  Social Connections: Not on file  Intimate Partner Violence: Not on file     No family history on file.  There were no vitals filed for this visit.  PHYSICAL EXAM: General:  Well appearing. No respiratory difficulty HEENT: normal Neck: supple. no JVD. Carotids 2+ bilat; no bruits. No lymphadenopathy or thryomegaly appreciated. Cor: PMI nondisplaced. Regular rate & rhythm. No rubs, gallops or murmurs. Lungs: clear Abdomen: soft, nontender, nondistended. No hepatosplenomegaly. No bruits or masses. Good bowel sounds. Extremities: no cyanosis, clubbing, rash, edema Neuro: alert & oriented x 3, cranial nerves grossly intact. moves all 4 extremities w/o difficulty. Affect pleasant.  ECG:   ASSESSMENT & PLAN: 1. 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 Echo 1/23 EF 20-25%, RV ok - LHC w/ severe MVCAD. RHC w/ normal filling pressures and preserved CO 5.4 L/min, cardiac index 2.88 - NYHA Class II. Euvolemic on exam - Continue Farxiga 10 mg daily  - BP too soft for ARNi/ARB currently  - Add Spiro 12.5 mg qhs - Continue Toprol XL 12.5 mg daily  - Continue Digoxin 0.125 mg daily. Check Dig level today  - Add Lasix 20 mg PRN  - Check BMP today and again in 7 days  - Refer to [facility] for ongoing management/observation. With un-revascularized multivessel CAD, worry about long term trajectory of HF. Will need to follow for potential need for advanced therapies in the future - Continue gradual GDMT titration as BP and renal fx permits - F/u in 3-4 more weeks - Plan repeat echo 3 months after meds fully optimized. EP referral for ICD if EF remains < 35%  - Discussed continuation of daily wts, low sodium diet and fluid restriction     2. CAD - NSTEMI 1/23 - LHC w/ severe, diffuse MVCAD - Medical Management (Diffuse inoperable CAD, targets not amendable to PCI) - Stable w/o CP - Continue ASA 81 mg daily -  Atorvastatin 80 mg daily  - Toprol XL 12.5 mg daily    3. PAF - Afib w/ RVR during recent admit (new) w/ spontaneous conversion to NSR -  NSR on EKG today. Well rate controlled w/ ? blocker - Eliquis for a/c. Denies abnormal bleeding. CBC today  - Continue Toprol XL 12.5 mg daily    4. ? Aortic Stenosis  - ? Mod AS on echo 1/23 (degree of AS hard to judge due to severe decrease in LV function DVI 0.55 AVA 2 cm2 but morphology and gradients suggest AS could be moderate) - will follow, repeat echo in 3 months   5. CKD IIIa - Baseline SCr ~1.5 - BMP today and again in 7 days w/ spiro addition  - Continue Farxiga 10 mg daily    6. Type 2DM - Hgb A1c 7.3  - Farxiga 10 mg    7. HLD w/ LDL Goal < 70  - LP 1/23 LDL 148 - now on high intensity statin, Atorva 80 mg - needs repeat FLP and HFTs in 6-8 weeks   8. Remote Tobacco Use - quit 40 years ago   9. Hypothyroidism  - on levothyroxine - management per PCP      NYHA II GDMT  Diuretic- Lasix 20 mg PRN BB- Toprol XL 12.5 mg daily  Ace/ARB/ARNI Not yet. BP too soft  MRA Adding Spiro 12.5 mg qhs  SGLT2i Farxiga 10 mg daily      Referred to HFSW (PCP, Medications, Transportation, ETOH Abuse, Drug Abuse, Insurance, Museum/gallery curator ): No Refer to Pharmacy: Yes  Refer to Home Health:  No Refer to Advanced Heart Failure Clinic: Yes Refer to General Cardiology:  No   Follow up  w/ the Advanced Heart Failure clinic in 3-4 weeks for further med titration. Assign to Dr. Haroldine Laws

## 2021-06-21 ENCOUNTER — Encounter (HOSPITAL_COMMUNITY): Payer: Medicare Other

## 2021-06-26 ENCOUNTER — Other Ambulatory Visit (HOSPITAL_COMMUNITY): Payer: Self-pay | Admitting: Cardiology

## 2021-07-02 DIAGNOSIS — H3581 Retinal edema: Secondary | ICD-10-CM | POA: Insufficient documentation

## 2021-07-02 DIAGNOSIS — H2513 Age-related nuclear cataract, bilateral: Secondary | ICD-10-CM | POA: Diagnosis not present

## 2021-07-02 DIAGNOSIS — E113393 Type 2 diabetes mellitus with moderate nonproliferative diabetic retinopathy without macular edema, bilateral: Secondary | ICD-10-CM | POA: Diagnosis not present

## 2021-07-02 DIAGNOSIS — E1136 Type 2 diabetes mellitus with diabetic cataract: Secondary | ICD-10-CM | POA: Diagnosis not present

## 2021-07-09 ENCOUNTER — Telehealth (HOSPITAL_COMMUNITY): Payer: Self-pay | Admitting: *Deleted

## 2021-07-09 ENCOUNTER — Encounter (HOSPITAL_COMMUNITY): Payer: Self-pay

## 2021-07-09 ENCOUNTER — Other Ambulatory Visit: Payer: Self-pay

## 2021-07-09 ENCOUNTER — Ambulatory Visit (HOSPITAL_COMMUNITY)
Admission: RE | Admit: 2021-07-09 | Discharge: 2021-07-09 | Disposition: A | Discharge status: Home / Self Care | Payer: Medicare Other | Source: Ambulatory Visit | Attending: Cardiology | Admitting: Cardiology

## 2021-07-09 VITALS — BP 122/84 | HR 75 | Wt 158.6 lb

## 2021-07-09 DIAGNOSIS — I2510 Atherosclerotic heart disease of native coronary artery without angina pectoris: Secondary | ICD-10-CM

## 2021-07-09 DIAGNOSIS — I482 Chronic atrial fibrillation, unspecified: Secondary | ICD-10-CM | POA: Diagnosis not present

## 2021-07-09 DIAGNOSIS — I255 Ischemic cardiomyopathy: Secondary | ICD-10-CM | POA: Diagnosis not present

## 2021-07-09 DIAGNOSIS — E1122 Type 2 diabetes mellitus with diabetic chronic kidney disease: Secondary | ICD-10-CM | POA: Insufficient documentation

## 2021-07-09 DIAGNOSIS — Z7901 Long term (current) use of anticoagulants: Secondary | ICD-10-CM | POA: Diagnosis not present

## 2021-07-09 DIAGNOSIS — E785 Hyperlipidemia, unspecified: Secondary | ICD-10-CM | POA: Diagnosis not present

## 2021-07-09 DIAGNOSIS — Z7982 Long term (current) use of aspirin: Secondary | ICD-10-CM | POA: Insufficient documentation

## 2021-07-09 DIAGNOSIS — Z79899 Other long term (current) drug therapy: Secondary | ICD-10-CM | POA: Insufficient documentation

## 2021-07-09 DIAGNOSIS — Z7984 Long term (current) use of oral hypoglycemic drugs: Secondary | ICD-10-CM | POA: Insufficient documentation

## 2021-07-09 DIAGNOSIS — N183 Chronic kidney disease, stage 3 unspecified: Secondary | ICD-10-CM

## 2021-07-09 DIAGNOSIS — I480 Paroxysmal atrial fibrillation: Secondary | ICD-10-CM | POA: Insufficient documentation

## 2021-07-09 DIAGNOSIS — N1831 Chronic kidney disease, stage 3a: Secondary | ICD-10-CM | POA: Diagnosis not present

## 2021-07-09 DIAGNOSIS — I5022 Chronic systolic (congestive) heart failure: Secondary | ICD-10-CM | POA: Diagnosis not present

## 2021-07-09 DIAGNOSIS — E1136 Type 2 diabetes mellitus with diabetic cataract: Secondary | ICD-10-CM | POA: Diagnosis not present

## 2021-07-09 DIAGNOSIS — E1142 Type 2 diabetes mellitus with diabetic polyneuropathy: Secondary | ICD-10-CM | POA: Diagnosis not present

## 2021-07-09 DIAGNOSIS — Z87891 Personal history of nicotine dependence: Secondary | ICD-10-CM

## 2021-07-09 DIAGNOSIS — Z79890 Hormone replacement therapy (postmenopausal): Secondary | ICD-10-CM | POA: Diagnosis not present

## 2021-07-09 DIAGNOSIS — I5023 Acute on chronic systolic (congestive) heart failure: Secondary | ICD-10-CM

## 2021-07-09 DIAGNOSIS — I350 Nonrheumatic aortic (valve) stenosis: Secondary | ICD-10-CM | POA: Diagnosis not present

## 2021-07-09 DIAGNOSIS — I252 Old myocardial infarction: Secondary | ICD-10-CM | POA: Insufficient documentation

## 2021-07-09 DIAGNOSIS — E039 Hypothyroidism, unspecified: Secondary | ICD-10-CM | POA: Insufficient documentation

## 2021-07-09 LAB — BASIC METABOLIC PANEL
Anion gap: 10 (ref 5–15)
BUN: 66 mg/dL — ABNORMAL HIGH (ref 8–23)
CO2: 23 mmol/L (ref 22–32)
Calcium: 9.9 mg/dL (ref 8.9–10.3)
Chloride: 102 mmol/L (ref 98–111)
Creatinine, Ser: 1.95 mg/dL — ABNORMAL HIGH (ref 0.61–1.24)
GFR, Estimated: 37 mL/min — ABNORMAL LOW (ref >60)
Glucose, Bld: 233 mg/dL — ABNORMAL HIGH (ref 70–99)
Potassium: 5.2 mmol/L — ABNORMAL HIGH (ref 3.5–5.1)
Sodium: 135 mmol/L (ref 135–145)

## 2021-07-09 LAB — DIGOXIN LEVEL: Digoxin Level: 0.7 ng/mL — ABNORMAL LOW (ref 0.8–2.0)

## 2021-07-09 MED ORDER — LOSARTAN POTASSIUM 25 MG PO TABS: 12.5000 mg | ORAL_TABLET | Freq: Every day | ORAL | 2 refills | Status: DC

## 2021-07-09 MED ORDER — INDOMETHACIN 25 MG PO CAPS: 25.0000 mg | ORAL_CAPSULE | Freq: Two times a day (BID) | ORAL | 3 refills | Status: DC | PRN

## 2021-07-09 NOTE — Telephone Encounter (Signed)
-----   Message from Jacklynn Ganong, [place] sent at 07/09/2021  4:45 PM EDT ----- ?K and kidney function elevated. Do NOT start losartan as discussed.  ? ?Change lasix to PRN only weight gain/edema.  ? ?Stop spiro.  Repeat BMET in 1 week ?

## 2021-07-09 NOTE — Telephone Encounter (Signed)
Spoke w/pt, he request I call his daughter in law [name], called [name], she is aware, agreeable, and verbalized understanding, med list updated, pt already sch for repeat labs ?

## 2021-07-09 NOTE — Patient Instructions (Signed)
Thank you for coming in today ? ?Labs were done today, if any labs are abnormal the clinic will call you ? ?Your physician recommends that you schedule a follow-up appointment in:  ?3 weeks with pharmacy ?6 week in clinic  ?12 with Dr. Haroldine Laws with echocardiogram ? ?Your physician has requested that you have an echocardiogram. Echocardiography is a painless test that uses sound waves to create images of your heart. It provides your doctor with information about the size and shape of your heart and how well your heart?s chambers and valves are working. This procedure takes approximately one hour. There are no restrictions for this procedure. ?  ? ?START Losartan 12.5 mg 1/2 tablet daily ? ?PLEASE take your Eliquis 1 tablet twice daily  ? ?At the [facility], you and your health needs are our priority. As part of our continuing mission to provide you with exceptional heart care, we have created designated Provider Care Teams. These Care Teams include your primary Cardiologist (physician) and Advanced Practice Providers (APPs- Physician Assistants and Nurse Practitioners) who all work together to provide you with the care you need, when you need it.  ? ?You may see any of the following providers on your designated Care Team at your next follow up: ?Dr Glori Bickers ?Dr Loralie Champagne ?Darrick Grinder, NP ?Lyda Jester, PA ?Jessica Milford,NP ?Marlyce Huge, PA ?Audry Riles, PharmD ? ? ?Please be sure to bring in all your medications bottles to every appointment.  ? ?If you have any questions or concerns before your next appointment please send [place] a message through [place] or call our office at [phone].   ? ?TO LEAVE A MESSAGE FOR THE NURSE SELECT OPTION 2, PLEASE LEAVE A MESSAGE INCLUDING: ?YOUR NAME ?DATE OF BIRTH ?CALL BACK NUMBER ?REASON FOR CALL**this is important as we prioritize the call backs ? ?YOU WILL RECEIVE A CALL BACK THE SAME DAY AS LONG AS YOU CALL BEFORE 4:00 PM ? ? ?

## 2021-07-09 NOTE — Progress Notes (Signed)
? ?ADVANCED HF CLINIC CONSULT NOTE ? ?Referring Provider: Lyda Jester, PA ?Primary Care: Jeannene Patella, PA ?HF Cardiologist: Dr. Haroldine Laws ? ?HPI: ?Antonio Cervantes 66 y.o. male w/ T2DM and remote tobacco use but no prior cardiac history, recently admitted to [facility] w/ new onset dyspnea, ruled in for NSTEMI and found to be in acute CHF and afib w/ RVR of unknown duration but spontaneously converted back to NSR. HS trop peaked 1118. Echo showed severely reduced LVEF, 20-25%, RV normal. LHC showed severe MVCAD, however given diffuse, distal vessel disease, there were no good options for revascularization (Diffuse inoperable CAD, targets not amendable to PCI). RHC showed normal filling pressures and normal CO  5.4 L/min, cardiac index 2.88. Medical therapy for CAD was recommended. Placed on ASA, statin and ? blocker. GDMT for systolic heart failure was limited by soft BP and renal insuffiency, SCr ~1.5 during hospitalization (baseline unknown). In addition to metoprolol, he was placed on Farxiga and digoxin. No ARB/ARNi nor spiro. No loop diuretic prescribed at discharge. Eliquis started for Afib. Referred to [facility] clinic.  ?  ?Of note, there was also concern for potential aortic stenosis base on echo interpretation, "degree of AS hard to judge due to severe decrease in LV function DVI 0.55 AVA 2 cm2 but morphology and gradients suggest AS could be moderate" . ?  ?Seen in [facility] 2/23, stable NYHA I-II symptoms. GDMT titrated.  ? ?Today he returns for HF follow up with grand-daugther. Overall feeling fine. He gets SOB with increased activity or walking further distances on flat ground. Slight SOB with stairs and occasional dizziness, but he attributes this to his cataracts. Denies abnormal bleeding, palpitations, CP, edema, or PND/Orthopnea. Appetite ok. No fever or chills. Weight at home 144 pounds. Taking all medications. Retired from [employer], lives with mother and helps care for her. He has been taking lasix  daily. ?  ?Cardiac Testing  ?- Echo (1/23): EF 20-25%, severe LV dysfunction, mild LVH, RV ok, mild MR, degree of AS hard to judge due to severe decrease in LV function DVI 0.55 AVA 2 cm2 but morphology and gradients suggest AS could be moderate .  ? ?  ?- LHC/RHC (05/21/21): ?  Prox LAD to Mid LAD lesion is 75% stenosed. ?  Dist LAD lesion is 80% stenosed. ?  1st Diag lesion is 80% stenosed. ?  Ost RCA to Mid RCA lesion is 99% stenosed. ?  2nd Mrg lesion is 99% stenosed. ?  Dist Cx lesion is 90% stenosed. ?  Prox Cx to Mid Cx lesion is 75% stenosed. ?  LV end diastolic pressure is normal. ?  There is no aortic valve stenosis. ?  ?Severe, multivessel coronary artery disease.   ?Normal LVEDP. ?Given diffuse, distal vessel disease, there are no good options for revascularization.  ? ?Right heart pressures: ?RA mean 2 ?PA pressure 26/10 mean 17 ?PCWP mean 6 ?CO/CI 5.4/2.88 (Fick) ? ?Review of Systems: [y] = yes, [ ]  = no  ? ?General: Weight gain [ ] ; Weight loss Blue.Reese ]; Anorexia [ ] ; Fatigue [ ] ; Fever [ ] ; Chills [ ] ; Weakness [ ]   ?Cardiac: Chest pain/pressure [ ] ; Resting SOB [ ] ; Exertional SOB Blue.Reese ]; Orthopnea [ ] ; Pedal Edema [ ] ; Palpitations [ ] ; Syncope [ ] ; Presyncope [ ] ; Paroxysmal nocturnal dyspnea[ ]   ?Pulmonary: Cough [ ] ; Wheezing[ ] ; Hemoptysis[ ] ; Sputum [ ] ; Snoring [ ]   ?GI: Vomiting[ ] ; Dysphagia[ ] ; Melena[ ] ; Hematochezia [ ] ; Heartburn[ ] ; Abdominal  pain [ ] ; Constipation [ ] ; Diarrhea [ ] ; BRBPR [ ]   ?GU: Hematuria[ ] ; Dysuria [ ] ; Nocturia[ ]   ?Vascular: Pain in legs with walking [ ] ; Pain in feet with lying flat [ ] ; Non-healing sores [ ] ; Stroke [ ] ; TIA [ ] ; Slurred speech [ ] ;  ?Neuro: Headaches[ ] ; Vertigo[ ] ; Seizures[ ] ; Paresthesias[ ] ;Blurred vision [ ] ; Diplopia [ ] ; Vision changes [ ]   ?Ortho/Skin: Arthritis [ ] ; Joint pain [ ] ; Muscle pain [ ] ; Joint swelling [ ] ; Back Pain [ ] ; Rash [ ]   ?Psych: Depression[ ] ; Anxiety[ ]   ?Heme: Bleeding problems [ ] ; Clotting disorders [ ] ; Anemia [  ]  ?Endocrine: Diabetes [y]; Thyroid dysfunction [y] ? ?Past Medical History:  ?Diagnosis Date  ? Diabetes mellitus without complication ([place])   ? Diabetic peripheral neuropathy associated with type 2 diabetes mellitus ([place])   ? ?Current Outpatient Medications  ?Medication Sig Dispense Refill  ? acetaminophen (TYLENOL) 325 MG tablet Take 2 tablets (650 mg total) by mouth every 6 (six) hours as needed. 30 tablet 0  ? apixaban (ELIQUIS) 5 MG TABS tablet Take 1 tablet (5 mg total) by mouth 2 (two) times daily. 60 tablet 1  ? aspirin 81 MG EC tablet Take 1 tablet (81 mg total) by mouth daily. Swallow whole. 30 tablet 11  ? atorvastatin (LIPITOR) 80 MG tablet Take 1 tablet (80 mg total) by mouth daily. 30 tablet 0  ? dapagliflozin propanediol (FARXIGA) 10 MG TABS tablet Take 1 tablet (10 mg total) by mouth daily. 30 tablet 2  ? digoxin (LANOXIN) 0.125 MG tablet Take 0.5 tablets (0.0625 mg total) by mouth daily. 30 tablet 1  ? ezetimibe (ZETIA) 10 MG tablet Take 1 tablet (10 mg total) by mouth daily. 90 tablet 3  ? furosemide (LASIX) 20 MG tablet TAKE 1 TABLET BY MOUTH AS NEEDED 30 tablet 1  ? indomethacin (INDOCIN) 25 MG capsule Take 25 mg by mouth 2 (two) times daily as needed (gout flares).    ? levothyroxine (SYNTHROID) 75 MCG tablet Take 1 tablet (75 mcg total) by mouth daily at 6 (six) AM. 30 tablet 0  ? metoprolol succinate (TOPROL-XL) 25 MG 24 hr tablet Take 1/2 tablet (12.5 mg total) by mouth daily. 30 tablet 1  ? spironolactone (ALDACTONE) 25 MG tablet Take 0.5 tablets (12.5 mg total) by mouth daily. 90 tablet 3  ? ?No current facility-administered medications for this encounter.  ? ?No Known Allergies ? ?Social History  ? ?Socioeconomic History  ? Marital status: Married  ?  Spouse name: Not on file  ? Number of children: 1  ? Years of education: Not on file  ? Highest education level: 10th grade  ?Occupational History  ? Occupation: retired  ?Tobacco Use  ? Smoking status: Former  ?  Packs/day: 1.50  ?  Years:  15.00  ?  Pack years: 22.50  ?  Types: Cigarettes  ?  Quit date: 68  ?  Years since quitting: 40.2  ? Smokeless tobacco: Never  ?Vaping Use  ? Vaping Use: Never used  ?Substance and Sexual Activity  ? Alcohol use: No  ? Drug use: No  ? Sexual activity: Not on file  ?Other Topics Concern  ? Not on file  ?Social History Narrative  ? Not on file  ? ?Social Determinants of Health  ? ?Financial Resource Strain: Low Risk   ? Difficulty of Paying Living Expenses: Not very hard  ?Food Insecurity: No Food Insecurity  ?  Worried About Charity fundraiser in the Last Year: Never true  ? Ran Out of Food in the Last Year: Never true  ?Transportation Needs: No Transportation Needs  ? Lack of Transportation (Medical): No  ? Lack of Transportation (Non-Medical): No  ?Physical Activity: Not on file  ?Stress: Not on file  ?Social Connections: Not on file  ?Intimate Partner Violence: Not on file  ? ?No family history on file. ? ?Wt Readings from Last 3 Encounters:  ?07/09/21 71.9 kg  ?05/31/21 74.8 kg  ?05/23/21 74.8 kg  ? ?BP 122/84   Pulse 75   Wt 71.9 kg   SpO2 98%   BMI 25.60 kg/m?  ? ?PHYSICAL EXAM: ?General:  NAD. No resp difficulty ?HEENT: Normal ?Neck: Supple. No JVD. Carotids 2+ bilat; no bruits. No lymphadenopathy or thryomegaly appreciated. ?Cor: PMI nondisplaced. Regular rate & rhythm. No rubs, gallops or murmurs. ?Lungs: Clear ?Abdomen: Soft, nontender, nondistended. No hepatosplenomegaly. No bruits or masses. Good bowel sounds. ?Extremities: No cyanosis, clubbing, rash, edema ?Neuro: Alert & oriented x 3, cranial nerves grossly intact. Moves all 4 extremities w/o difficulty. Affect pleasant. ? ?ASSESSMENT & PLAN: ?1. Chronic Systolic Heart Failure ?- Ischemic CM.  ?- Echo 1/23 EF 20-25%, RV ok ?- LHC (1/23): w/ severe MVCAD. RHC w/ normal filling pressures and preserved CO 5.4 L/min, cardiac index 2.88 ?- NYHA Class II. Euvolemic on exam. ?- Start losartan 12.5 mg daily. ?- Continue Farxiga 10 mg daily.  ?- Continue  spiro 12.5 mg qhs ?- Continue Toprol XL 12.5 mg daily  ?- Continue digoxin 0.125 mg daily.   ?- Continue Lasix 20 mg daily for now. Can likely switch to PRN w/ addition of Entresto. ?- With un-revascula

## 2021-07-12 ENCOUNTER — Telehealth (HOSPITAL_COMMUNITY): Payer: Self-pay

## 2021-07-12 NOTE — Telephone Encounter (Signed)
Pt would like to attend cardiac rehab at [facility]. Will refer pt to [facility] cardiac rehab ?

## 2021-07-22 ENCOUNTER — Other Ambulatory Visit: Payer: Self-pay

## 2021-07-22 ENCOUNTER — Ambulatory Visit (HOSPITAL_COMMUNITY)
Admission: RE | Admit: 2021-07-22 | Discharge: 2021-07-22 | Disposition: A | Discharge status: Home / Self Care | Payer: Medicare Other | Source: Ambulatory Visit | Attending: Cardiology | Admitting: Cardiology

## 2021-07-22 ENCOUNTER — Emergency Department (HOSPITAL_COMMUNITY): Payer: Medicare Other

## 2021-07-22 ENCOUNTER — Inpatient Hospital Stay (HOSPITAL_COMMUNITY)
Admission: EM | Admit: 2021-07-22 | Discharge: 2021-08-05 | DRG: 220 | Disposition: A | Discharge status: Home / Self Care | Payer: Medicare Other | Attending: Surgery | Admitting: Surgery

## 2021-07-22 ENCOUNTER — Encounter (HOSPITAL_COMMUNITY): Payer: Self-pay | Admitting: Emergency Medicine

## 2021-07-22 DIAGNOSIS — E1165 Type 2 diabetes mellitus with hyperglycemia: Secondary | ICD-10-CM | POA: Diagnosis present

## 2021-07-22 DIAGNOSIS — Z7984 Long term (current) use of oral hypoglycemic drugs: Secondary | ICD-10-CM

## 2021-07-22 DIAGNOSIS — Z7982 Long term (current) use of aspirin: Secondary | ICD-10-CM

## 2021-07-22 DIAGNOSIS — Z87891 Personal history of nicotine dependence: Secondary | ICD-10-CM

## 2021-07-22 DIAGNOSIS — Z951 Presence of aortocoronary bypass graft: Secondary | ICD-10-CM | POA: Diagnosis not present

## 2021-07-22 DIAGNOSIS — I5032 Chronic diastolic (congestive) heart failure: Secondary | ICD-10-CM | POA: Diagnosis present

## 2021-07-22 DIAGNOSIS — Z952 Presence of prosthetic heart valve: Secondary | ICD-10-CM

## 2021-07-22 DIAGNOSIS — I252 Old myocardial infarction: Secondary | ICD-10-CM | POA: Diagnosis not present

## 2021-07-22 DIAGNOSIS — I480 Paroxysmal atrial fibrillation: Secondary | ICD-10-CM | POA: Diagnosis present

## 2021-07-22 DIAGNOSIS — D62 Acute posthemorrhagic anemia: Secondary | ICD-10-CM | POA: Diagnosis not present

## 2021-07-22 DIAGNOSIS — I5022 Chronic systolic (congestive) heart failure: Secondary | ICD-10-CM | POA: Diagnosis present

## 2021-07-22 DIAGNOSIS — E039 Hypothyroidism, unspecified: Secondary | ICD-10-CM | POA: Diagnosis present

## 2021-07-22 DIAGNOSIS — R001 Bradycardia, unspecified: Secondary | ICD-10-CM | POA: Diagnosis not present

## 2021-07-22 DIAGNOSIS — I083 Combined rheumatic disorders of mitral, aortic and tricuspid valves: Secondary | ICD-10-CM | POA: Diagnosis not present

## 2021-07-22 DIAGNOSIS — I350 Nonrheumatic aortic (valve) stenosis: Secondary | ICD-10-CM | POA: Diagnosis not present

## 2021-07-22 DIAGNOSIS — I25118 Atherosclerotic heart disease of native coronary artery with other forms of angina pectoris: Secondary | ICD-10-CM | POA: Diagnosis not present

## 2021-07-22 DIAGNOSIS — I213 ST elevation (STEMI) myocardial infarction of unspecified site: Secondary | ICD-10-CM | POA: Diagnosis not present

## 2021-07-22 DIAGNOSIS — I130 Hypertensive heart and chronic kidney disease with heart failure and stage 1 through stage 4 chronic kidney disease, or unspecified chronic kidney disease: Secondary | ICD-10-CM | POA: Diagnosis present

## 2021-07-22 DIAGNOSIS — I2510 Atherosclerotic heart disease of native coronary artery without angina pectoris: Secondary | ICD-10-CM | POA: Diagnosis not present

## 2021-07-22 DIAGNOSIS — N183 Chronic kidney disease, stage 3 unspecified: Secondary | ICD-10-CM | POA: Diagnosis not present

## 2021-07-22 DIAGNOSIS — Z79890 Hormone replacement therapy (postmenopausal): Secondary | ICD-10-CM

## 2021-07-22 DIAGNOSIS — R6889 Other general symptoms and signs: Secondary | ICD-10-CM | POA: Diagnosis not present

## 2021-07-22 DIAGNOSIS — Z79899 Other long term (current) drug therapy: Secondary | ICD-10-CM

## 2021-07-22 DIAGNOSIS — I255 Ischemic cardiomyopathy: Secondary | ICD-10-CM | POA: Diagnosis present

## 2021-07-22 DIAGNOSIS — Z7901 Long term (current) use of anticoagulants: Secondary | ICD-10-CM

## 2021-07-22 DIAGNOSIS — Z20822 Contact with and (suspected) exposure to covid-19: Secondary | ICD-10-CM | POA: Diagnosis present

## 2021-07-22 DIAGNOSIS — I248 Other forms of acute ischemic heart disease: Secondary | ICD-10-CM | POA: Diagnosis not present

## 2021-07-22 DIAGNOSIS — Z743 Need for continuous supervision: Secondary | ICD-10-CM | POA: Diagnosis not present

## 2021-07-22 DIAGNOSIS — D72829 Elevated white blood cell count, unspecified: Secondary | ICD-10-CM | POA: Diagnosis present

## 2021-07-22 DIAGNOSIS — J90 Pleural effusion, not elsewhere classified: Secondary | ICD-10-CM | POA: Diagnosis not present

## 2021-07-22 DIAGNOSIS — I358 Other nonrheumatic aortic valve disorders: Secondary | ICD-10-CM | POA: Diagnosis not present

## 2021-07-22 DIAGNOSIS — I5020 Unspecified systolic (congestive) heart failure: Secondary | ICD-10-CM | POA: Diagnosis not present

## 2021-07-22 DIAGNOSIS — I517 Cardiomegaly: Secondary | ICD-10-CM | POA: Diagnosis not present

## 2021-07-22 DIAGNOSIS — I5023 Acute on chronic systolic (congestive) heart failure: Secondary | ICD-10-CM | POA: Insufficient documentation

## 2021-07-22 DIAGNOSIS — E785 Hyperlipidemia, unspecified: Secondary | ICD-10-CM | POA: Diagnosis present

## 2021-07-22 DIAGNOSIS — I351 Nonrheumatic aortic (valve) insufficiency: Secondary | ICD-10-CM | POA: Diagnosis not present

## 2021-07-22 DIAGNOSIS — I952 Hypotension due to drugs: Secondary | ICD-10-CM | POA: Diagnosis not present

## 2021-07-22 DIAGNOSIS — I25110 Atherosclerotic heart disease of native coronary artery with unstable angina pectoris: Secondary | ICD-10-CM | POA: Diagnosis not present

## 2021-07-22 DIAGNOSIS — Z4682 Encounter for fitting and adjustment of non-vascular catheter: Secondary | ICD-10-CM | POA: Diagnosis not present

## 2021-07-22 DIAGNOSIS — I352 Nonrheumatic aortic (valve) stenosis with insufficiency: Secondary | ICD-10-CM | POA: Diagnosis not present

## 2021-07-22 DIAGNOSIS — E1142 Type 2 diabetes mellitus with diabetic polyneuropathy: Secondary | ICD-10-CM | POA: Diagnosis not present

## 2021-07-22 DIAGNOSIS — R079 Chest pain, unspecified: Secondary | ICD-10-CM | POA: Diagnosis not present

## 2021-07-22 DIAGNOSIS — K029 Dental caries, unspecified: Secondary | ICD-10-CM | POA: Diagnosis not present

## 2021-07-22 DIAGNOSIS — T462X5A Adverse effect of other antidysrhythmic drugs, initial encounter: Secondary | ICD-10-CM | POA: Diagnosis not present

## 2021-07-22 DIAGNOSIS — E119 Type 2 diabetes mellitus without complications: Secondary | ICD-10-CM | POA: Diagnosis not present

## 2021-07-22 DIAGNOSIS — R109 Unspecified abdominal pain: Secondary | ICD-10-CM | POA: Diagnosis not present

## 2021-07-22 DIAGNOSIS — N179 Acute kidney failure, unspecified: Secondary | ICD-10-CM | POA: Diagnosis not present

## 2021-07-22 DIAGNOSIS — E1122 Type 2 diabetes mellitus with diabetic chronic kidney disease: Secondary | ICD-10-CM | POA: Diagnosis not present

## 2021-07-22 DIAGNOSIS — I499 Cardiac arrhythmia, unspecified: Secondary | ICD-10-CM | POA: Diagnosis not present

## 2021-07-22 DIAGNOSIS — I214 Non-ST elevation (NSTEMI) myocardial infarction: Secondary | ICD-10-CM | POA: Diagnosis not present

## 2021-07-22 DIAGNOSIS — Z01810 Encounter for preprocedural cardiovascular examination: Secondary | ICD-10-CM | POA: Diagnosis not present

## 2021-07-22 LAB — TROPONIN I (HIGH SENSITIVITY): Troponin I (High Sensitivity): 473 ng/L (ref <18)

## 2021-07-22 LAB — DIGOXIN LEVEL: Digoxin Level: 0.5 ng/mL — ABNORMAL LOW (ref 0.8–2.0)

## 2021-07-22 LAB — CBC WITH DIFFERENTIAL/PLATELET
Abs Immature Granulocytes: 0.03 10*3/uL (ref 0.00–0.07)
Basophils Absolute: 0.1 10*3/uL (ref 0.0–0.1)
Basophils Relative: 1 %
Eosinophils Absolute: 0.4 10*3/uL (ref 0.0–0.5)
Eosinophils Relative: 4 %
HCT: 36.9 % — ABNORMAL LOW (ref 39.0–52.0)
Hemoglobin: 12.4 g/dL — ABNORMAL LOW (ref 13.0–17.0)
Immature Granulocytes: 0 %
Lymphocytes Relative: 17 %
Lymphs Abs: 1.8 10*3/uL (ref 0.7–4.0)
MCH: 30.2 pg (ref 26.0–34.0)
MCHC: 33.6 g/dL (ref 30.0–36.0)
MCV: 90 fL (ref 80.0–100.0)
Monocytes Absolute: 0.6 10*3/uL (ref 0.1–1.0)
Monocytes Relative: 5 %
Neutro Abs: 7.7 10*3/uL (ref 1.7–7.7)
Neutrophils Relative %: 73 %
Platelets: 263 10*3/uL (ref 150–400)
RBC: 4.1 MIL/uL — ABNORMAL LOW (ref 4.22–5.81)
RDW: 14.3 % (ref 11.5–15.5)
WBC: 10.6 10*3/uL — ABNORMAL HIGH (ref 4.0–10.5)
nRBC: 0 % (ref 0.0–0.2)

## 2021-07-22 LAB — BASIC METABOLIC PANEL
Anion gap: 7 (ref 5–15)
BUN: 33 mg/dL — ABNORMAL HIGH (ref 8–23)
CO2: 22 mmol/L (ref 22–32)
Calcium: 9.8 mg/dL (ref 8.9–10.3)
Chloride: 111 mmol/L (ref 98–111)
Creatinine, Ser: 1.43 mg/dL — ABNORMAL HIGH (ref 0.61–1.24)
GFR, Estimated: 54 mL/min — ABNORMAL LOW (ref >60)
Glucose, Bld: 181 mg/dL — ABNORMAL HIGH (ref 70–99)
Potassium: 4 mmol/L (ref 3.5–5.1)
Sodium: 140 mmol/L (ref 135–145)

## 2021-07-22 LAB — COMPREHENSIVE METABOLIC PANEL
ALT: 20 U/L (ref 0–44)
AST: 25 U/L (ref 15–41)
Albumin: 3.7 g/dL (ref 3.5–5.0)
Alkaline Phosphatase: 57 U/L (ref 38–126)
Anion gap: 10 (ref 5–15)
BUN: 35 mg/dL — ABNORMAL HIGH (ref 8–23)
CO2: 19 mmol/L — ABNORMAL LOW (ref 22–32)
Calcium: 9.3 mg/dL (ref 8.9–10.3)
Chloride: 108 mmol/L (ref 98–111)
Creatinine, Ser: 1.61 mg/dL — ABNORMAL HIGH (ref 0.61–1.24)
GFR, Estimated: 47 mL/min — ABNORMAL LOW (ref >60)
Glucose, Bld: 280 mg/dL — ABNORMAL HIGH (ref 70–99)
Potassium: 3.9 mmol/L (ref 3.5–5.1)
Sodium: 137 mmol/L (ref 135–145)
Total Bilirubin: 0.7 mg/dL (ref 0.3–1.2)
Total Protein: 7 g/dL (ref 6.5–8.1)

## 2021-07-22 NOTE — ED Provider Triage Note (Signed)
Emergency Medicine Provider Triage Evaluation Note ? ?Antonio Cervantes , a 66 y.o. male  was evaluated in triage.  Pt complains of chest pain that began 1 hour PTA. Sharp/aching to central chest w/ some radiation to the jaw, associated mild dyspnea/lightheadedness, no syncope. Aspirin & NTG w/ EMS w/ improvement. ? ?Review of Systems  ?Positive: Chest pain, dyspnea, lightheaded ?Negative: Nausea, vomiting, diaphoresis, syncope ? ?Physical Exam  ?Ht 5\' 6"  (1.676 m)   Wt 71.9 kg   BMI 25.58 kg/m?  ?Gen:   Awake, no distress   ?Resp:  Normal effort  ?MSK:   Moves extremities without difficulty  ?Other:  No significant pitting edema to lower legs.  ? ?Medical Decision Making  ?Medically screening exam initiated at 10:07 PM.  Appropriate orders placed.  Antonio Cervantes was informed that the remainder of the evaluation will be completed by another provider, this initial triage assessment does not replace that evaluation, and the importance of remaining in the ED until their evaluation is complete. ? ?Chest pain ?BP 80s/60s--> will remain in triage.  ?Current pain 2/10 in severity. ?Charge RN made aware of need for room in acute care ED.   ?  ?4/10, PA-C ?07/22/21 2249 ? ?

## 2021-07-22 NOTE — ED Triage Notes (Signed)
Pt arrive from home by PTAR for CP that started today, MI on January, new CHF pt. 324mg  ASA given by EMS pta and 2 nitro, pt is pain free now. ?

## 2021-07-22 NOTE — ED Notes (Signed)
Antonio Cervantes [phone] would like an update ?

## 2021-07-22 NOTE — ED Provider Notes (Signed)
?[facility] EMERGENCY DEPARTMENT ?Provider Note ? ? ?CSN: [id number] ?Arrival date & time: 07/22/21  2148 ? ?  ? ?History ? ?Chief Complaint  ?Patient presents with  ? Chest Pain  ? ? ?Antonio Cervantes is a 66 y.o. male. ? ?The history is provided by the patient and medical records.  ?Chest Pain ? ?66 year old male with history of CHF with a EF of 20 to 25%, diabetes, peripheral neuropathy, hyperlipidemia, A-fib on Eliquis, NSTEMI in January 2023 with known occlusions not amenable to stenting/bypass, presenting to the ED with chest pain.  Started about 1 hour PTA after he had walked from outside back into the house.  States pain was left sided, radiating into the neck/jaw area.  He did report some mild diaphoresis and lightheadedness with nausea but no vomiting or syncope.  He was given aspirin and 2 sublingual nitro by EMS and states now chest feels "tight" but pain he was experiencing has fully resolved.  States he has been compliant with all of his new medications.  He has been followed closely by cardiology as an outpatient after his MI in January. ? ?Last heart cath 05/21/21: ?  Prox LAD to Mid LAD lesion is 75% stenosed. ?  Dist LAD lesion is 80% stenosed. ?  1st Diag lesion is 80% stenosed. ?  Ost RCA to Mid RCA lesion is 99% stenosed. ?  2nd Mrg lesion is 99% stenosed. ?  Dist Cx lesion is 90% stenosed. ?  Prox Cx to Mid Cx lesion is 75% stenosed. ?  LV end diastolic pressure is normal. ?  There is no aortic valve stenosis. ?  ?Severe, multivessel coronary artery disease.  Normal LVEDP. ? ?Home Medications ?Prior to Admission medications   ?Medication Sig Start Date End Date Taking? Authorizing Provider  ?acetaminophen (TYLENOL) 325 MG tablet Take 2 tablets (650 mg total) by mouth every 6 (six) hours as needed. 07/14/16   Derwood Kaplan, MD  ?allopurinol (ZYLOPRIM) 100 MG tablet Take 100 mg by mouth daily. 06/11/21   [provider]  ?apixaban (ELIQUIS) 5 MG TABS tablet Take 5 mg by  mouth daily.    [provider]  ?aspirin 81 MG EC tablet Take 1 tablet (81 mg total) by mouth daily. Swallow whole. 05/23/21   Arty Baumgartner, NP  ?atorvastatin (LIPITOR) 80 MG tablet Take 1 tablet (80 mg total) by mouth daily. 05/24/21   Burnadette Pop, MD  ?dapagliflozin propanediol (FARXIGA) 10 MG TABS tablet Take 1 tablet (10 mg total) by mouth daily. 05/31/21   Robbie Lis M, PA-C  ?digoxin (LANOXIN) 0.125 MG tablet Take 0.5 tablets (0.0625 mg total) by mouth daily. 05/31/21   Allayne Butcher, PA-C  ?ezetimibe (ZETIA) 10 MG tablet Take 1 tablet (10 mg total) by mouth daily. 06/12/21   Lyn Records, MD  ?furosemide (LASIX) 20 MG tablet TAKE 1 TABLET BY MOUTH AS NEEDED 07/01/21   Robbie Lis M, PA-C  ?indomethacin (INDOCIN) 25 MG capsule Take 1 capsule (25 mg total) by mouth 2 (two) times daily as needed (gout flares). 07/09/21   Jacklynn Ganong, FNP  ?levothyroxine (SYNTHROID) 75 MCG tablet Take 1 tablet (75 mcg total) by mouth daily at 6 (six) AM. 05/24/21   Burnadette Pop, MD  ?metoprolol succinate (TOPROL-XL) 25 MG 24 hr tablet Take 1/2 tablet (12.5 mg total) by mouth daily. 05/31/21   Allayne Butcher, PA-C  ?   ? ?Allergies    ?Patient has no known allergies.   ? ?  Review of Systems   ?Review of Systems  ?Cardiovascular:  Positive for chest pain.  ?All other systems reviewed and are negative. ? ?Physical Exam ?Updated Vital Signs ?BP (!) 82/54 (BP Location: Right Arm)   Pulse 73   Temp 98.3 ?F (36.8 ?C)   Resp 17   Ht 5\' 6"  (1.676 m)   Wt 71.9 kg   SpO2 99%   BMI 25.58 kg/m?  ? ?Physical Exam ?Vitals and nursing note reviewed.  ?Constitutional:   ?   Appearance: He is well-developed.  ?   Comments: Appears older than stated age  ?HENT:  ?   Head: Normocephalic and atraumatic.  ?Eyes:  ?   Conjunctiva/sclera: Conjunctivae normal.  ?   Pupils: Pupils are equal, round, and reactive to light.  ?Neck:  ?   Comments: No JVD ?Cardiovascular:  ?   Rate and Rhythm: Normal rate  and regular rhythm.  ?   Heart sounds: Normal heart sounds.  ?Pulmonary:  ?   Effort: Pulmonary effort is normal.  ?   Breath sounds: Normal breath sounds.  ?Abdominal:  ?   General: Bowel sounds are normal.  ?   Palpations: Abdomen is soft.  ?Musculoskeletal:     ?   General: Normal range of motion.  ?   Cervical back: Normal range of motion.  ?   Comments: No significant peripheral edema  ?Skin: ?   General: Skin is warm and dry.  ?Neurological:  ?   Mental Status: He is alert and oriented to person, place, and time.  ? ? ?ED Results / Procedures / Treatments   ?Labs ?(all labs ordered are listed, but only abnormal results are displayed) ?Labs Reviewed  ?COMPREHENSIVE METABOLIC PANEL - Abnormal; Notable for the following components:  ?    Result Value  ? CO2 19 (*)   ? Glucose, Bld 280 (*)   ? BUN 35 (*)   ? Creatinine, Ser 1.61 (*)   ? GFR, Estimated 47 (*)   ? All other components within normal limits  ?CBC WITH DIFFERENTIAL/PLATELET - Abnormal; Notable for the following components:  ? WBC 10.6 (*)   ? RBC 4.10 (*)   ? Hemoglobin 12.4 (*)   ? HCT 36.9 (*)   ? All other components within normal limits  ?BRAIN NATRIURETIC PEPTIDE - Abnormal; Notable for the following components:  ? B Natriuretic Peptide 237.3 (*)   ? All other components within normal limits  ?DIGOXIN LEVEL - Abnormal; Notable for the following components:  ? Digoxin Level 0.5 (*)   ? All other components within normal limits  ?TROPONIN I (HIGH SENSITIVITY) - Abnormal; Notable for the following components:  ? Troponin I (High Sensitivity) 473 (*)   ? All other components within normal limits  ?TROPONIN I (HIGH SENSITIVITY) - Abnormal; Notable for the following components:  ? Troponin I (High Sensitivity) 1,610 (*)   ? All other components within normal limits  ?RESP PANEL BY RT-PCR (FLU A&B, COVID) ARPGX2  ?BASIC METABOLIC PANEL  ?LIPID PANEL  ?CBC  ?PROTIME-INR  ? ? ?EKG ?None ? ?Radiology ?DG Chest Portable 1 View ? ?Result Date:  07/22/2021 ?CLINICAL DATA:  Chest pain. EXAM: PORTABLE CHEST 1 VIEW COMPARISON:  May 19, 2021 FINDINGS: The heart size and mediastinal contours are within normal limits. Both lungs are clear. The visualized skeletal structures are unremarkable. IMPRESSION: No active disease. Electronically Signed   By: May 21, 2021 M.D.   On: 07/22/2021 22:51   ? ?Procedures ?Procedures  ? ? ?  CRITICAL CARE ?Performed by: Garlon HatchetLisa M Farris Blash ? ? ?Total critical care time: 45 minutes ? ?Critical care time was exclusive of separately billable procedures and treating other patients. ? ?Critical care was necessary to treat or prevent imminent or life-threatening deterioration. ? ?Critical care was time spent personally by me on the following activities: development of treatment plan with patient and/or surrogate as well as nursing, discussions with consultants, evaluation of patient's response to treatment, examination of patient, obtaining history from patient or surrogate, ordering and performing treatments and interventions, ordering and review of laboratory studies, ordering and review of radiographic studies, pulse oximetry and re-evaluation of patient's condition. ? ? ?Medications Ordered in ED ?Medications  ?allopurinol (ZYLOPRIM) tablet 100 mg (has no administration in time range)  ?aspirin EC tablet 81 mg (has no administration in time range)  ?atorvastatin (LIPITOR) tablet 80 mg (has no administration in time range)  ?digoxin (LANOXIN) tablet 0.0625 mg (has no administration in time range)  ?ezetimibe (ZETIA) tablet 10 mg (has no administration in time range)  ?metoprolol succinate (TOPROL-XL) 24 hr tablet 12.5 mg (has no administration in time range)  ?dapagliflozin propanediol (FARXIGA) tablet 10 mg (has no administration in time range)  ?levothyroxine (SYNTHROID) tablet 75 mcg (has no administration in time range)  ?nitroGLYCERIN (NITROSTAT) SL tablet 0.4 mg (has no administration in time range)  ?acetaminophen (TYLENOL)  tablet 650 mg (has no administration in time range)  ?ondansetron (ZOFRAN) injection 4 mg (has no administration in time range)  ? ? ?ED Course/ Medical Decision Making/ A&P ?  ?                        ?Medical Decision Stacy GardnerMaki

## 2021-07-23 ENCOUNTER — Other Ambulatory Visit (HOSPITAL_COMMUNITY): Payer: Self-pay | Admitting: Cardiology

## 2021-07-23 ENCOUNTER — Inpatient Hospital Stay (HOSPITAL_COMMUNITY): Payer: Medicare Other

## 2021-07-23 ENCOUNTER — Other Ambulatory Visit (HOSPITAL_COMMUNITY): Payer: Medicare Other

## 2021-07-23 DIAGNOSIS — E1122 Type 2 diabetes mellitus with diabetic chronic kidney disease: Secondary | ICD-10-CM | POA: Diagnosis not present

## 2021-07-23 DIAGNOSIS — I350 Nonrheumatic aortic (valve) stenosis: Secondary | ICD-10-CM | POA: Diagnosis not present

## 2021-07-23 DIAGNOSIS — Z79899 Other long term (current) drug therapy: Secondary | ICD-10-CM | POA: Diagnosis not present

## 2021-07-23 DIAGNOSIS — N179 Acute kidney failure, unspecified: Secondary | ICD-10-CM | POA: Diagnosis present

## 2021-07-23 DIAGNOSIS — E1165 Type 2 diabetes mellitus with hyperglycemia: Secondary | ICD-10-CM | POA: Diagnosis present

## 2021-07-23 DIAGNOSIS — I25110 Atherosclerotic heart disease of native coronary artery with unstable angina pectoris: Secondary | ICD-10-CM | POA: Diagnosis not present

## 2021-07-23 DIAGNOSIS — E1142 Type 2 diabetes mellitus with diabetic polyneuropathy: Secondary | ICD-10-CM | POA: Diagnosis not present

## 2021-07-23 DIAGNOSIS — Z7901 Long term (current) use of anticoagulants: Secondary | ICD-10-CM | POA: Diagnosis not present

## 2021-07-23 DIAGNOSIS — I351 Nonrheumatic aortic (valve) insufficiency: Secondary | ICD-10-CM | POA: Diagnosis not present

## 2021-07-23 DIAGNOSIS — I255 Ischemic cardiomyopathy: Secondary | ICD-10-CM

## 2021-07-23 DIAGNOSIS — Z952 Presence of prosthetic heart valve: Secondary | ICD-10-CM | POA: Diagnosis not present

## 2021-07-23 DIAGNOSIS — I214 Non-ST elevation (NSTEMI) myocardial infarction: Principal | ICD-10-CM | POA: Diagnosis present

## 2021-07-23 DIAGNOSIS — Z87891 Personal history of nicotine dependence: Secondary | ICD-10-CM | POA: Diagnosis not present

## 2021-07-23 DIAGNOSIS — Z951 Presence of aortocoronary bypass graft: Secondary | ICD-10-CM | POA: Diagnosis not present

## 2021-07-23 DIAGNOSIS — E039 Hypothyroidism, unspecified: Secondary | ICD-10-CM | POA: Diagnosis not present

## 2021-07-23 DIAGNOSIS — N183 Chronic kidney disease, stage 3 unspecified: Secondary | ICD-10-CM | POA: Diagnosis not present

## 2021-07-23 DIAGNOSIS — I952 Hypotension due to drugs: Secondary | ICD-10-CM | POA: Diagnosis not present

## 2021-07-23 DIAGNOSIS — I5032 Chronic diastolic (congestive) heart failure: Secondary | ICD-10-CM | POA: Diagnosis not present

## 2021-07-23 DIAGNOSIS — I252 Old myocardial infarction: Secondary | ICD-10-CM | POA: Diagnosis not present

## 2021-07-23 DIAGNOSIS — D72829 Elevated white blood cell count, unspecified: Secondary | ICD-10-CM | POA: Diagnosis not present

## 2021-07-23 DIAGNOSIS — Z20822 Contact with and (suspected) exposure to covid-19: Secondary | ICD-10-CM | POA: Diagnosis not present

## 2021-07-23 DIAGNOSIS — I352 Nonrheumatic aortic (valve) stenosis with insufficiency: Secondary | ICD-10-CM | POA: Diagnosis not present

## 2021-07-23 DIAGNOSIS — E785 Hyperlipidemia, unspecified: Secondary | ICD-10-CM | POA: Diagnosis present

## 2021-07-23 DIAGNOSIS — I5020 Unspecified systolic (congestive) heart failure: Secondary | ICD-10-CM | POA: Diagnosis not present

## 2021-07-23 DIAGNOSIS — Z01810 Encounter for preprocedural cardiovascular examination: Secondary | ICD-10-CM | POA: Diagnosis not present

## 2021-07-23 DIAGNOSIS — I248 Other forms of acute ischemic heart disease: Secondary | ICD-10-CM | POA: Diagnosis not present

## 2021-07-23 DIAGNOSIS — E119 Type 2 diabetes mellitus without complications: Secondary | ICD-10-CM | POA: Diagnosis not present

## 2021-07-23 DIAGNOSIS — I2510 Atherosclerotic heart disease of native coronary artery without angina pectoris: Secondary | ICD-10-CM | POA: Diagnosis not present

## 2021-07-23 DIAGNOSIS — I130 Hypertensive heart and chronic kidney disease with heart failure and stage 1 through stage 4 chronic kidney disease, or unspecified chronic kidney disease: Secondary | ICD-10-CM | POA: Diagnosis not present

## 2021-07-23 DIAGNOSIS — T462X5A Adverse effect of other antidysrhythmic drugs, initial encounter: Secondary | ICD-10-CM | POA: Diagnosis not present

## 2021-07-23 DIAGNOSIS — R001 Bradycardia, unspecified: Secondary | ICD-10-CM | POA: Diagnosis not present

## 2021-07-23 DIAGNOSIS — I480 Paroxysmal atrial fibrillation: Secondary | ICD-10-CM | POA: Diagnosis present

## 2021-07-23 DIAGNOSIS — D62 Acute posthemorrhagic anemia: Secondary | ICD-10-CM | POA: Diagnosis not present

## 2021-07-23 LAB — LIPID PANEL
Cholesterol: 73 mg/dL (ref 0–200)
HDL: 19 mg/dL — ABNORMAL LOW (ref >40)
LDL Cholesterol: 32 mg/dL (ref 0–99)
Total CHOL/HDL Ratio: 3.8 RATIO
Triglycerides: 108 mg/dL (ref <150)
VLDL: 22 mg/dL (ref 0–40)

## 2021-07-23 LAB — HEPARIN LEVEL (UNFRACTIONATED): Heparin Unfractionated: >1.1 IU/mL — ABNORMAL HIGH (ref 0.30–0.70)

## 2021-07-23 LAB — APTT: aPTT: 102 seconds — ABNORMAL HIGH (ref 24–36)

## 2021-07-23 LAB — CBC
HCT: 33.6 % — ABNORMAL LOW (ref 39.0–52.0)
Hemoglobin: 11.2 g/dL — ABNORMAL LOW (ref 13.0–17.0)
MCH: 30.1 pg (ref 26.0–34.0)
MCHC: 33.3 g/dL (ref 30.0–36.0)
MCV: 90.3 fL (ref 80.0–100.0)
Platelets: 231 10*3/uL (ref 150–400)
RBC: 3.72 MIL/uL — ABNORMAL LOW (ref 4.22–5.81)
RDW: 14.3 % (ref 11.5–15.5)
WBC: 11.9 10*3/uL — ABNORMAL HIGH (ref 4.0–10.5)
nRBC: 0 % (ref 0.0–0.2)

## 2021-07-23 LAB — BASIC METABOLIC PANEL
Anion gap: 9 (ref 5–15)
BUN: 32 mg/dL — ABNORMAL HIGH (ref 8–23)
CO2: 19 mmol/L — ABNORMAL LOW (ref 22–32)
Calcium: 9.1 mg/dL (ref 8.9–10.3)
Chloride: 111 mmol/L (ref 98–111)
Creatinine, Ser: 1.39 mg/dL — ABNORMAL HIGH (ref 0.61–1.24)
GFR, Estimated: 56 mL/min — ABNORMAL LOW (ref >60)
Glucose, Bld: 190 mg/dL — ABNORMAL HIGH (ref 70–99)
Potassium: 4.1 mmol/L (ref 3.5–5.1)
Sodium: 139 mmol/L (ref 135–145)

## 2021-07-23 LAB — HEMOGLOBIN A1C
Hgb A1c MFr Bld: 8.4 % — ABNORMAL HIGH (ref 4.8–5.6)
Mean Plasma Glucose: 194.38 mg/dL

## 2021-07-23 LAB — BRAIN NATRIURETIC PEPTIDE: B Natriuretic Peptide: 237.3 pg/mL — ABNORMAL HIGH (ref 0.0–100.0)

## 2021-07-23 LAB — PROTIME-INR
INR: 1.5 — ABNORMAL HIGH (ref 0.8–1.2)
Prothrombin Time: 17.7 seconds — ABNORMAL HIGH (ref 11.4–15.2)

## 2021-07-23 LAB — CBG MONITORING, ED
Glucose-Capillary: 178 mg/dL — ABNORMAL HIGH (ref 70–99)
Glucose-Capillary: 162 mg/dL — ABNORMAL HIGH (ref 70–99)
Glucose-Capillary: 140 mg/dL — ABNORMAL HIGH (ref 70–99)

## 2021-07-23 LAB — RESP PANEL BY RT-PCR (FLU A&B, COVID) ARPGX2
Influenza A by PCR: NEGATIVE
Influenza B by PCR: NEGATIVE
SARS Coronavirus 2 by RT PCR: NEGATIVE

## 2021-07-23 LAB — GLUCOSE, CAPILLARY
Glucose-Capillary: 147 mg/dL — ABNORMAL HIGH (ref 70–99)
Glucose-Capillary: 150 mg/dL — ABNORMAL HIGH (ref 70–99)

## 2021-07-23 LAB — TROPONIN I (HIGH SENSITIVITY): Troponin I (High Sensitivity): 1610 ng/L (ref <18)

## 2021-07-23 MED ORDER — INSULIN ASPART 100 UNIT/ML IJ SOLN
0.0000 [IU] | Freq: Three times a day (TID) | INTRAMUSCULAR | Status: DC
  Administered 2021-07-23: 1 [IU] via SUBCUTANEOUS
  Administered 2021-07-24 (×2): 2 [IU] via SUBCUTANEOUS
  Administered 2021-07-25: 1 [IU] via SUBCUTANEOUS
  Administered 2021-07-25: 2 [IU] via SUBCUTANEOUS
  Administered 2021-07-25 – 2021-07-28 (×7): 1 [IU] via SUBCUTANEOUS

## 2021-07-23 MED ORDER — DIGOXIN 125 MCG PO TABS
0.0625 mg | ORAL_TABLET | Freq: Every day | ORAL | Status: DC
  Administered 2021-07-23: 0.0625 mg via ORAL
  Filled 2021-07-23: qty 1

## 2021-07-23 MED ORDER — ISOSORBIDE MONONITRATE ER 30 MG PO TB24
15.0000 mg | ORAL_TABLET | Freq: Every day | ORAL | Status: DC
  Administered 2021-07-23: 15 mg via ORAL
  Filled 2021-07-23 (×2): qty 1

## 2021-07-23 MED ORDER — LEVOTHYROXINE SODIUM 75 MCG PO TABS
75.0000 ug | ORAL_TABLET | Freq: Every day | ORAL | Status: DC
  Administered 2021-07-23 – 2021-07-29 (×7): 75 ug via ORAL
  Filled 2021-07-23 (×7): qty 1

## 2021-07-23 MED ORDER — EZETIMIBE 10 MG PO TABS
10.0000 mg | ORAL_TABLET | Freq: Every day | ORAL | Status: DC
  Administered 2021-07-23 – 2021-07-28 (×6): 10 mg via ORAL
  Filled 2021-07-23 (×6): qty 1

## 2021-07-23 MED ORDER — ACETAMINOPHEN 325 MG PO TABS: 650.0000 mg | ORAL_TABLET | ORAL | Status: DC | PRN

## 2021-07-23 MED ORDER — ALLOPURINOL 100 MG PO TABS
100.0000 mg | ORAL_TABLET | Freq: Every day | ORAL | Status: DC
  Administered 2021-07-24 – 2021-07-28 (×5): 100 mg via ORAL
  Filled 2021-07-23 (×6): qty 1

## 2021-07-23 MED ORDER — METOPROLOL SUCCINATE ER 25 MG PO TB24
12.5000 mg | ORAL_TABLET | Freq: Every day | ORAL | Status: DC
Start: 2021-07-23 — End: 2021-07-29
  Administered 2021-07-23 – 2021-07-28 (×6): 12.5 mg via ORAL
  Filled 2021-07-23 (×6): qty 1

## 2021-07-23 MED ORDER — INSULIN ASPART 100 UNIT/ML IJ SOLN: 0.0000 [IU] | Freq: Every day | INTRAMUSCULAR | Status: DC

## 2021-07-23 MED ORDER — ONDANSETRON HCL 4 MG/2ML IJ SOLN: 4.0000 mg | Freq: Four times a day (QID) | INTRAMUSCULAR | Status: DC | PRN

## 2021-07-23 MED ORDER — NITROGLYCERIN 0.4 MG SL SUBL: 0.4000 mg | SUBLINGUAL_TABLET | SUBLINGUAL | Status: DC | PRN

## 2021-07-23 MED ORDER — HEPARIN (PORCINE) 25000 UT/250ML-% IV SOLN
900.0000 [IU]/h | INTRAVENOUS | Status: DC
Start: 2021-07-23 — End: 2021-07-24
  Administered 2021-07-23: 1000 [IU]/h via INTRAVENOUS
  Filled 2021-07-23 (×2): qty 250

## 2021-07-23 MED ORDER — ASPIRIN EC 81 MG PO TBEC
81.0000 mg | DELAYED_RELEASE_TABLET | Freq: Every day | ORAL | Status: DC
  Administered 2021-07-23 – 2021-07-28 (×6): 81 mg via ORAL
  Filled 2021-07-23 (×6): qty 1

## 2021-07-23 MED ORDER — ATORVASTATIN CALCIUM 80 MG PO TABS
80.0000 mg | ORAL_TABLET | Freq: Every day | ORAL | Status: DC
  Administered 2021-07-23 – 2021-07-28 (×6): 80 mg via ORAL
  Filled 2021-07-23 (×6): qty 1

## 2021-07-23 MED ORDER — GADOBUTROL 1 MMOL/ML IV SOLN
10.0000 mL | Freq: Once | INTRAVENOUS | Status: AC | PRN
  Administered 2021-07-23: 10 mL via INTRAVENOUS

## 2021-07-23 MED ORDER — DAPAGLIFLOZIN PROPANEDIOL 10 MG PO TABS
10.0000 mg | ORAL_TABLET | Freq: Every day | ORAL | Status: DC
  Administered 2021-07-24: 10 mg via ORAL
  Filled 2021-07-23 (×3): qty 1

## 2021-07-23 NOTE — ED Notes (Signed)
This tech tech pt CBG. Provided pt will a urinal. Made sure pt call bell is in place. ?

## 2021-07-23 NOTE — Progress Notes (Signed)
ANTICOAGULATION CONSULT NOTE ? ?Pharmacy Consult for Heparin  ?Indication: chest pain/ACS and atrial fibrillation ? ?No Known Allergies ? ?Patient Measurements: ?Height: 5\' 3"  (160 cm) ?Weight: 70.7 kg (155 lb 13.8 oz) ?IBW/kg (Calculated) : 56.9 ? ?Vital Signs: ?Temp: 98 ?F (36.7 ?C) (03/28 2010) ?Temp Source: Oral (03/28 2010) ?BP: 101/59 (03/28 2010) ?Pulse Rate: 56 (03/28 2010) ? ?Labs: ?Recent Labs  ?  07/22/21 ?1433 07/22/21 ?2224 07/23/21 ?0010 07/23/21 ?0453 07/23/21 ?2058  ?HGB  --  12.4*  --  11.2*  --   ?HCT  --  36.9*  --  33.6*  --   ?PLT  --  263  --  231  --   ?APTT  --   --   --   --  102*  ?LABPROT  --   --   --  17.7*  --   ?INR  --   --   --  1.5*  --   ?HEPARINUNFRC  --   --   --   --  >1.10*  ?CREATININE 1.43* 1.61*  --  1.39*  --   ?TROPONINIHS  --  473* 1,610*  --   --   ? ? ? ?Estimated Creatinine Clearance: 46.1 mL/min (A) (by C-G formula based on SCr of 1.39 mg/dL (H)). ? ? ?Medical History: ?Past Medical History:  ?Diagnosis Date  ? Diabetes mellitus without complication ([facility])   ? Diabetic peripheral neuropathy associated with type 2 diabetes mellitus ([facility])   ? ? ? ?Assessment: ?66 y/o M with CP and elevated troponin. Starting heparin. On apixaban PTA for afib, last dose was 3/27 at 2000. Will start heparin 12 hours after that dose.  ? ?Initial heparin level >1.1 due to influence of apixaban, aPTT is therapeutic at 102 seconds but at upper end of range. ? ?Goal of Therapy:  ?Heparin level 0.3-0.7 units/ml ?aPTT 66-102 seconds ?Monitor platelets by anticoagulation protocol: Yes ?  ?Plan:  ?Reduce heparin to 900 units/h ?Recheck heparin level with am labs ? ?4/27, PharmD, BCPS, BCCP ?Clinical Pharmacist ?[phone] ?Please check AMION for all [facility] Pharmacy numbers ?07/23/2021 ? ? ? ?

## 2021-07-23 NOTE — Progress Notes (Signed)
ANTICOAGULATION CONSULT NOTE - Initial Consult ? ?Pharmacy Consult for Heparin  ?Indication: chest pain/ACS and atrial fibrillation ? ?No Known Allergies ? ?Patient Measurements: ?Height: 5\' 6"  (167.6 cm) ?Weight: 71.9 kg (158 lb 8.2 oz) ?IBW/kg (Calculated) : 63.8 ? ?Vital Signs: ?Temp: 98.3 ?F (36.8 ?C) (03/27 2208) ?BP: 117/70 (03/28 0045) ?Pulse Rate: 55 (03/28 0045) ? ?Labs: ?Recent Labs  ?  07/22/21 ?1433 07/22/21 ?2224 07/23/21 ?0010  ?HGB  --  12.4*  --   ?HCT  --  36.9*  --   ?PLT  --  263  --   ?CREATININE 1.43* 1.61*  --   ?TROPONINIHS  --  473* 1,610*  ? ? ?Estimated Creatinine Clearance: 40.7 mL/min (A) (by C-G formula based on SCr of 1.61 mg/dL (H)). ? ? ?Medical History: ?Past Medical History:  ?Diagnosis Date  ? Diabetes mellitus without complication ([place])   ? Diabetic peripheral neuropathy associated with type 2 diabetes mellitus ([place])   ? ? ? ?Assessment: ?66 y/o M with CP and elevated troponin. Starting heparin. On apixaban PTA for afib, last dose was 3/27 at 2000. Will start heparin 12 hours after that dose. Hgb 12.4. Scr 1.61. Anticipate using aPTT to dose for now.  ? ?Goal of Therapy:  ?Heparin level 0.3-0.7 units/ml ?aPTT 66-102 seconds ?Monitor platelets by anticoagulation protocol: Yes ?  ?Plan:  ?Start heparin drip at 1000 units/hr at 0800 this AM ?1600 aPTT and heparin level ?Daily CBC, heparin level, and aPTT ?Monitor for bleeding ? ?Narda Bonds, PharmD, BCPS ?Clinical Pharmacist ?Phone: [phone] ? ? ?

## 2021-07-23 NOTE — Progress Notes (Addendum)
? ?The patient has been seen in conjunction with Rick Duff, MD. All aspects of care have been considered and discussed. The patient has been personally interviewed, examined, and all clinical data has been reviewed. ? ?Needs myocardial viability study by MRI. If viable muscle will need to stretch our thoughts about revascularization. Will get official TCTS consult and speak with CTO team (discuss RCA and Cfx CTO procedures with [place]). ?Add LA nitrate. Continue beta blocker. ?May not need re-cath since marker elevation is minimal. ?Last echo EF was 25%. Have unable to start RAAS therapy or spironolactone due to BP.Continue Farxiga, beta blocker, and add LA nitrate. ? ? ?Progress Note ? ?Patient Name: Antonio Cervantes ?Date of Encounter: 07/23/2021 ? ?[facility] Cardiologist: None  ? ?Subjective  ? ?Patient continues to have chest pressure in the left side of his chest but states this is less severe than when he presented to the ED. Since he was discharged from the hospital he states he has not had chest pain or shortness of breath. He says he usually mows the lawn and was able to do this once time since 04/2021.  ? ?Inpatient Medications  ?  ?Scheduled Meds: ? allopurinol  100 mg Oral Daily  ? aspirin EC  81 mg Oral Daily  ? atorvastatin  80 mg Oral Daily  ? dapagliflozin propanediol  10 mg Oral Daily  ? digoxin  0.0625 mg Oral Daily  ? ezetimibe  10 mg Oral Daily  ? levothyroxine  75 mcg Oral Q0600  ? metoprolol succinate  12.5 mg Oral Daily  ? ?Continuous Infusions: ? heparin 1,000 Units/hr (07/23/21 [id number])  ? ?PRN Meds: ?acetaminophen, nitroGLYCERIN, ondansetron (ZOFRAN) IV  ? ?Vital Signs  ?  ?Vitals:  ? 07/23/21 0745 07/23/21 0800 07/23/21 0815 07/23/21 0830  ?BP: (!) 109/54 (!) 105/58 130/68 115/67  ?Pulse: (!) 58 (!) 56 (!) 52 (!) 49  ?Resp: 14 15    ?Temp:      ?SpO2: 96% 95% 97% 98%  ?Weight:      ?Height:      ? ?No intake or output data in the 24 hours ending 07/23/21 1128 ? ?   07/23/2021  ?  4:30 AM 07/22/2021  ?  9:58 PM 07/09/2021  ?  2:02 PM  ?Last 3 Weights  ?Weight (lbs) 158 lb 8.2 oz 158 lb 8.2 oz 158 lb 9.6 oz  ?Weight (kg) 71.9 kg 71.9 kg 71.94 kg  ?   ? ?Telemetry  ?  ?Sinus bradycardia  - Personally Reviewed ? ?ECG  ?  ?No new EKG- Personally Reviewed ? ?Physical Exam  ? ?GEN: No acute distress.   ?Neck: No JVD ?Cardiac: RRR, no murmurs, rubs, or gallops.  ?Respiratory: Clear to auscultation bilaterally. ?GI: Soft, nontender, non-distended  ?MS: No edema; No deformity. ?Neuro:  Nonfocal  ?Psych: Normal affect  ? ?Labs  ?  ?High Sensitivity Troponin:   ?Recent Labs  ?Lab 07/22/21 ?2224 07/23/21 ?0010  ?TROPONINIHS 473* 1,610*  ?   ?Chemistry ?Recent Labs  ?Lab 07/22/21 ?1433 07/22/21 ?2224 07/23/21 ?0453  ?NA 140 137 139  ?K 4.0 3.9 4.1  ?CL 111 108 111  ?CO2 22 19* 19*  ?GLUCOSE 181* 280* 190*  ?BUN 33* 35* 32*  ?CREATININE 1.43* 1.61* 1.39*  ?CALCIUM 9.8 9.3 9.1  ?PROT  --  7.0  --   ?ALBUMIN  --  3.7  --   ?AST  --  25  --   ?ALT  --  20  --   ?ALKPHOS  --  57  --   ?BILITOT  --  0.7  --   ?GFRNONAA 54* 47* 56*  ?ANIONGAP 7 10 9   ?  ?Lipids  ?Recent Labs  ?Lab 07/23/21 ?[id number]  ?CHOL 73  ?TRIG 108  ?HDL 19*  ?[place] 32  ?CHOLHDL 3.8  ?  ?Hematology ?Recent Labs  ?Lab 07/22/21 ?2224 07/23/21 ?0453  ?WBC 10.6* 11.9*  ?RBC 4.10* 3.72*  ?HGB 12.4* 11.2*  ?HCT 36.9* 33.6*  ?MCV 90.0 90.3  ?MCH 30.2 30.1  ?MCHC 33.6 33.3  ?RDW 14.3 14.3  ?PLT 263 231  ? ?Thyroid No results for input(s): TSH, FREET4 in the last 168 hours.  ?BNP ?Recent Labs  ?Lab 07/22/21 ?2244  ?BNP 237.3*  ?  ?DDimer No results for input(s): DDIMER in the last 168 hours.  ? ?Radiology  ?  ?DG Chest Portable 1 View ? ?Result Date: 07/22/2021 ?CLINICAL DATA:  Chest pain. EXAM: PORTABLE CHEST 1 VIEW COMPARISON:  May 19, 2021 FINDINGS: The heart size and mediastinal contours are within normal limits. Both lungs are clear. The visualized skeletal structures are unremarkable. IMPRESSION: No active disease. Electronically  Signed   By: Virgina Norfolk M.D.   On: 07/22/2021 22:51   ? ?Cardiac Studies  ?TTE pending ? ?TTE 05/20/21 ?IMPRESSIONS ?Septal apical mid/distal anterior wall and inferior apex akinetic suggesting LAD infarct. ?Left ventricular ejection fraction, by estimation, is 20 to 25%. The left ventricle has ?severely decreased function. The left ventricle demonstrates regional wall motion ?abnormalities (see scoring diagram/findings for description). The left ventricular ?internal cavity size was severely dilated. There is mild left ventricular hypertrophy. Left ?ventricular diastolic parameters were normal. ? ?Right ventricular systolic function is normal. The right ventricular size is normal. ?There is normal pulmonary artery systolic pressure. ? ?2. The mitral valve is normal in structure. Mild mitral valve regurgitation. No evidence of ?mitral stenosis. ? ?3. Degree of AS hard to judge due to severe decrease in LV function DVI 0.55 AVA 2 cm2 ?but morphology and gradients suggest AS could be moderate . The aortic valve is ?normal in structure. There is moderate calcification of the aortic valve. There is ?moderate thickening of the aortic valve. Aortic valve regurgitation is mild. Aortic valve ?sclerosis/calcification is present, without any evidence of aortic stenosis. ? ?4. The inferior vena cava is normal in size with greater than 50% respiratory variability, ?suggesting right atrial pressure of 3 mmHg. ? ?Cath: 05/21/21 ?  ?  Prox LAD to Mid LAD lesion is 75% stenosed. ?  Dist LAD lesion is 80% stenosed. ?  1st Diag lesion is 80% stenosed. ?  Ost RCA to Mid RCA lesion is 99% stenosed. ?  2nd Mrg lesion is 99% stenosed. ?  Dist Cx lesion is 90% stenosed. ?  Prox Cx to Mid Cx lesion is 75% stenosed. ?  LV end diastolic pressure is normal. ?  There is no aortic valve stenosis. ?  ?Severe, multivessel coronary artery disease.  Normal LVEDP. ?  ?Given diffuse, distal vessel disease, there are no good options for  revascularization.  Plan for medical therapy to try to improve his LV function.  Probable heart failure consultation as well. ?  ?Diagnostic ?Dominance: Right ?  ? ?Patient Profile  ?   ?66 y.o. 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 ? ?Assessment & Plan  ?  ?#NSTEMI ?#HFrEF 2/2 ICM  ?Patient was recently hospitalized 04/2021 for NSTEMI. He presented this hospitalization with L sided chest pressure that subsided with sublingual nitrates. He was not found to have EKG c/w STEMI but did have elevated troponins. Patient was started on heparin infusion and metoprolol. During his last hospitalization LHC was performed and showed disease per above. In addition his TTE showed EF of 20 to 25%, LV RWMA, septal apical mid/distal anterior wall and inferior apex akinesis suggestive of LAD infarct.  ? ?This AM patient continues to have chest pressure though it is less severe relative to presenting symptoms. He has no signs or symptoms of volume overload.  ?-Will obtain cardiac MRI to determine viability of patient's myocardium ?-C/s cardiac surgery for formal comment on patient's potential for CABG ?-Continue low dose toprolol 12.5mg  daily, hold for HR <55 ?-Will start low dose imdur given persistent chest pain ?-Continue heparin infusion  ?-Continue ASA ?-Holding ACE/ARB/ARNI and MRA given renal function and soft blood pressures, will titrate GDMT as blood pressures and renal function allow  ?-Continue farxiga  ?-Hold diuresis, euvolemic on exam  ?-F/u repeat TTE  ? ? ?#Aortic stenosis  ?During recent hospitalization patient's TTE showed moderate stenosis with:  ?AORTIC VALVE ?AV Area (Vmax): 2.06 cm?? ?AV Area (Vmean): 1.99 cm?? ?AV Area (VTI): 1.74  cm?? ?AV Vmax: 209.40 cm/s ?AV Vmean: 146.200 cm/s ?AV VTI: 0.400 m ?AV Peak Grad: 17.5 mmHg ?AV Mean Grad: 10.0 mmHg ?Patient's pressures and velocity were thought to be artificially low given his low EF.  ?-Will co

## 2021-07-23 NOTE — H&P (Signed)
?Cardiology Admission History and Physical:  ? ?Patient ID: Antonio Cervantes ?MRN: [id number]; DOB: 09-17-1955  ? ?Admission date: 07/22/2021 ? ?PCP:  Jeannene Patella, PA ?  ?[facility] Providers ?Cardiologist:  None      ? ? ?Chief Complaint:  chest pain ? ?Patient Profile:  ? ?Antonio Cervantes is a 66 y.o. male with T2DM, CAD, HTN, hypothyroidism, tobacco use, HLD who is being seen 07/23/2021 for the evaluation of chest pain. ? ?History of Present Illness:  ? ?Antonio Cervantes is a 66 y.o. male with T2DM, CAD, HTN, hypothyroidism, tobacco use, HLD who is being seen 07/23/2021 for the evaluation of chest pain.  He has known multivessel coronary artery disease after a NSTEMI in January 2023 for which he underwent left heart catheterization.  At that time he did not have any clear targetable lesions and no good options for surgical revascularization.  He was started on medical therapy. ? ?Yesterday, he had an acute onset episode of chest pain around 8 PM.  Substernal chest pain that radiated to his neck with associated diaphoresis.  He called EMS and on route to the emergency department was given aspirin and 2 sublingual nitro.  This intervention improves his chest pain greatly. ? ?Saw him in the emergency department, he was no longer having any chest pain.  The rest of my review of systems was unremarkable. ? ? ?Past Medical History:  ?Diagnosis Date  ? Diabetes mellitus without complication ([place])   ? Diabetic peripheral neuropathy associated with type 2 diabetes mellitus ([place])   ? ? ?Past Surgical History:  ?Procedure Laterality Date  ? RIGHT/LEFT HEART CATH AND CORONARY ANGIOGRAPHY N/A 05/21/2021  ? Procedure: RIGHT/LEFT HEART CATH AND CORONARY ANGIOGRAPHY;  Surgeon: Jettie Booze, MD;  Location: [facility];  Service: Cardiovascular;  Laterality: N/A;  ?  ? ?Medications Prior to Admission: ?Prior to Admission medications   ?Medication Sig Start Date End Date Taking? Authorizing Provider   ?acetaminophen (TYLENOL) 325 MG tablet Take 2 tablets (650 mg total) by mouth every 6 (six) hours as needed. 07/14/16   Varney Biles, MD  ?allopurinol (ZYLOPRIM) 100 MG tablet Take 100 mg by mouth daily. 06/11/21   [provider]  ?apixaban (ELIQUIS) 5 MG TABS tablet Take 5 mg by mouth daily.    [provider]  ?aspirin 81 MG EC tablet Take 1 tablet (81 mg total) by mouth daily. Swallow whole. 05/23/21   Cheryln Manly, NP  ?atorvastatin (LIPITOR) 80 MG tablet Take 1 tablet (80 mg total) by mouth daily. 05/24/21   Shelly Coss, MD  ?dapagliflozin propanediol (FARXIGA) 10 MG TABS tablet Take 1 tablet (10 mg total) by mouth daily. 05/31/21   Lyda Jester M, PA-C  ?digoxin (LANOXIN) 0.125 MG tablet Take 0.5 tablets (0.0625 mg total) by mouth daily. 05/31/21   Consuelo Pandy, PA-C  ?ezetimibe (ZETIA) 10 MG tablet Take 1 tablet (10 mg total) by mouth daily. 06/12/21   Belva Crome, MD  ?furosemide (LASIX) 20 MG tablet TAKE 1 TABLET BY MOUTH AS NEEDED 07/01/21   Lyda Jester M, PA-C  ?indomethacin (INDOCIN) 25 MG capsule Take 1 capsule (25 mg total) by mouth 2 (two) times daily as needed (gout flares). 07/09/21   Rafael Bihari, FNP  ?levothyroxine (SYNTHROID) 75 MCG tablet Take 1 tablet (75 mcg total) by mouth daily at 6 (six) AM. 05/24/21   Shelly Coss, MD  ?metoprolol succinate (TOPROL-XL) 25 MG 24 hr tablet Take 1/2 tablet (12.5  mg total) by mouth daily. 05/31/21   Consuelo Pandy, PA-C  ?  ? ?Allergies:   No Known Allergies ? ?Social History:   ?Social History  ? ?Socioeconomic History  ? Marital status: Married  ?  Spouse name: Not on file  ? Number of children: 1  ? Years of education: Not on file  ? Highest education level: 10th grade  ?Occupational History  ? Occupation: retired  ?Tobacco Use  ? Smoking status: Former  ?  Packs/day: 1.50  ?  Years: 15.00  ?  Pack years: 22.50  ?  Types: Cigarettes  ?  Quit date: 38  ?  Years since quitting: 40.2  ? Smokeless  tobacco: Never  ?Vaping Use  ? Vaping Use: Never used  ?Substance and Sexual Activity  ? Alcohol use: No  ? Drug use: No  ? Sexual activity: Not on file  ?Other Topics Concern  ? Not on file  ?Social History Narrative  ? Not on file  ? ?Social Determinants of Health  ? ?Financial Resource Strain: Low Risk   ? Difficulty of Paying Living Expenses: Not very hard  ?Food Insecurity: No Food Insecurity  ? Worried About Charity fundraiser in the Last Year: Never true  ? Ran Out of Food in the Last Year: Never true  ?Transportation Needs: No Transportation Needs  ? Lack of Transportation (Medical): No  ? Lack of Transportation (Non-Medical): No  ?Physical Activity: Not on file  ?Stress: Not on file  ?Social Connections: Not on file  ?Intimate Partner Violence: Not on file  ?  ?Family History:  noncontributory ?The patient's family history is not on file.   ? ?ROS:  ?Please see the history of present illness.  ?All other ROS reviewed and negative.    ? ?Physical Exam/Data:  ? ?Vitals:  ? 07/22/21 2330 07/23/21 0015 07/23/21 0030 07/23/21 0045  ?BP: (!) 92/49 109/70 108/63 117/70  ?Pulse: (!) 59 (!) 56 (!) 56 (!) 55  ?Resp: 15 14 17 16   ?Temp:      ?SpO2: 98% 98% 98% 98%  ?Weight:      ?Height:      ? ?No intake or output data in the 24 hours ending 07/23/21 0140 ? ?  07/22/2021  ?  9:58 PM 07/09/2021  ?  2:02 PM 05/31/2021  ? 11:04 AM  ?Last 3 Weights  ?Weight (lbs) 158 lb 8.2 oz 158 lb 9.6 oz 165 lb  ?Weight (kg) 71.9 kg 71.94 kg 74.844 kg  ?   ?Body mass index is 25.58 kg/m?.  ?General:   no acute distress ?HEENT: normal ?Neck:  JVD midneck ?Vascular: No carotid bruits; Distal pulses 2+ bilaterally   ?Cardiac:  normal S1, S2; RRR; no murmur  ?Lungs:  clear to auscultation bilaterally, no wheezing, rhonchi or rales  ?Abd: soft, nontender, no hepatomegaly  ?Ext: no edema ?Musculoskeletal:  No deformities, BUE and BLE strength normal and equal ?Skin: warm and dry  ?Neuro:  CNs 2-12 intact, no focal abnormalities noted ?Psych:   Normal affect  ? ? ?EKG:  The ECG that was done  was personally reviewed and demonstrates normal sinus rhythm, nonspecific st t wave changes ? ?Relevant CV Studies: ?N/a ? ?Laboratory Data: ? ?High Sensitivity Troponin:   ?Recent Labs  ?Lab 07/22/21 ?2224 07/23/21 ?0010  ?TROPONINIHS 473* 1,610*  ?    ?Chemistry ?Recent Labs  ?Lab 07/22/21 ?1433 07/22/21 ?2224  ?NA 140 137  ?K 4.0 3.9  ?CL 111 108  ?  CO2 22 19*  ?GLUCOSE 181* 280*  ?BUN 33* 35*  ?CREATININE 1.43* 1.61*  ?CALCIUM 9.8 9.3  ?GFRNONAA 54* 47*  ?ANIONGAP 7 10  ?  ?Recent Labs  ?Lab 07/22/21 ?2224  ?PROT 7.0  ?ALBUMIN 3.7  ?AST 25  ?ALT 20  ?ALKPHOS 57  ?BILITOT 0.7  ? ?Lipids No results for input(s): CHOL, TRIG, HDL, LABVLDL, LDLCALC, CHOLHDL in the last 168 hours. ?Hematology ?Recent Labs  ?Lab 07/22/21 ?2224  ?WBC 10.6*  ?RBC 4.10*  ?HGB 12.4*  ?HCT 36.9*  ?MCV 90.0  ?MCH 30.2  ?MCHC 33.6  ?RDW 14.3  ?PLT 263  ? ?Thyroid No results for input(s): TSH, FREET4 in the last 168 hours. ?BNP ?Recent Labs  ?Lab 07/22/21 ?2244  ?BNP 237.3*  ?  ?DDimer No results for input(s): DDIMER in the last 168 hours. ? ? ?Radiology/Studies:  ?DG Chest Portable 1 View ? ?Result Date: 07/22/2021 ?CLINICAL DATA:  Chest pain. EXAM: PORTABLE CHEST 1 VIEW COMPARISON:  May 19, 2021 FINDINGS: The heart size and mediastinal contours are within normal limits. Both lungs are clear. The visualized skeletal structures are unremarkable. IMPRESSION: No active disease. Electronically Signed   By: Virgina Norfolk M.D.   On: 07/22/2021 22:51   ? ? ?Assessment and Plan:  ? ?NSTEMI.  Has known severe three-vessel disease that in January did not have any options for revascularization.  For, given new onset chest pain and significant rise in troponin, query whether he should be taken back to the Cath Lab for another look.  I have transitioned him from apixaban to start heparin infusion for this reason.  He is hemodynamically stable, so continued his beta-blocker.  I ordered a repeat echo.   Continue aspirin and statin/zetia. ?HFrEF: LVEF 20-25%. Stable currently. Continue GDMT: metop, farxiga.  ?Afib with RVR: in sinus rhythm. Held anticoagulation in the setting of possible procedure - start heparin. Co

## 2021-07-24 ENCOUNTER — Inpatient Hospital Stay (HOSPITAL_COMMUNITY): Payer: Medicare Other

## 2021-07-24 DIAGNOSIS — I248 Other forms of acute ischemic heart disease: Secondary | ICD-10-CM

## 2021-07-24 DIAGNOSIS — N179 Acute kidney failure, unspecified: Secondary | ICD-10-CM | POA: Diagnosis not present

## 2021-07-24 DIAGNOSIS — I214 Non-ST elevation (NSTEMI) myocardial infarction: Secondary | ICD-10-CM | POA: Diagnosis not present

## 2021-07-24 DIAGNOSIS — I5020 Unspecified systolic (congestive) heart failure: Secondary | ICD-10-CM | POA: Diagnosis not present

## 2021-07-24 DIAGNOSIS — I480 Paroxysmal atrial fibrillation: Secondary | ICD-10-CM | POA: Diagnosis not present

## 2021-07-24 DIAGNOSIS — I25118 Atherosclerotic heart disease of native coronary artery with other forms of angina pectoris: Secondary | ICD-10-CM

## 2021-07-24 DIAGNOSIS — I350 Nonrheumatic aortic (valve) stenosis: Secondary | ICD-10-CM

## 2021-07-24 DIAGNOSIS — I255 Ischemic cardiomyopathy: Secondary | ICD-10-CM | POA: Diagnosis not present

## 2021-07-24 LAB — ECHOCARDIOGRAM COMPLETE
AR max vel: 1.47 cm2
AV Area VTI: 1.18 cm2
AV Area mean vel: 1.41 cm2
AV Mean grad: 11 mmHg
AV Peak grad: 21 mmHg
Ao pk vel: 2.29 m/s
Area-P 1/2: 4.15 cm2
Height: 63 in
P 1/2 time: 604 msec
S' Lateral: 3.16 cm
Weight: 2496 oz

## 2021-07-24 LAB — CBC
HCT: 32.6 % — ABNORMAL LOW (ref 39.0–52.0)
Hemoglobin: 10.8 g/dL — ABNORMAL LOW (ref 13.0–17.0)
MCH: 30 pg (ref 26.0–34.0)
MCHC: 33.1 g/dL (ref 30.0–36.0)
MCV: 90.6 fL (ref 80.0–100.0)
Platelets: 184 10*3/uL (ref 150–400)
RBC: 3.6 MIL/uL — ABNORMAL LOW (ref 4.22–5.81)
RDW: 14.4 % (ref 11.5–15.5)
WBC: 10.6 10*3/uL — ABNORMAL HIGH (ref 4.0–10.5)
nRBC: 0 % (ref 0.0–0.2)

## 2021-07-24 LAB — GLUCOSE, CAPILLARY
Glucose-Capillary: 112 mg/dL — ABNORMAL HIGH (ref 70–99)
Glucose-Capillary: 165 mg/dL — ABNORMAL HIGH (ref 70–99)
Glucose-Capillary: 156 mg/dL — ABNORMAL HIGH (ref 70–99)
Glucose-Capillary: 130 mg/dL — ABNORMAL HIGH (ref 70–99)

## 2021-07-24 LAB — BASIC METABOLIC PANEL
Anion gap: 9 (ref 5–15)
BUN: 31 mg/dL — ABNORMAL HIGH (ref 8–23)
CO2: 18 mmol/L — ABNORMAL LOW (ref 22–32)
Calcium: 8.8 mg/dL — ABNORMAL LOW (ref 8.9–10.3)
Chloride: 111 mmol/L (ref 98–111)
Creatinine, Ser: 1.41 mg/dL — ABNORMAL HIGH (ref 0.61–1.24)
GFR, Estimated: 55 mL/min — ABNORMAL LOW (ref >60)
Glucose, Bld: 116 mg/dL — ABNORMAL HIGH (ref 70–99)
Potassium: 3.8 mmol/L (ref 3.5–5.1)
Sodium: 138 mmol/L (ref 135–145)

## 2021-07-24 LAB — HEPARIN LEVEL (UNFRACTIONATED): Heparin Unfractionated: >1.1 IU/mL — ABNORMAL HIGH (ref 0.30–0.70)

## 2021-07-24 LAB — APTT
aPTT: 77 seconds — ABNORMAL HIGH (ref 24–36)
aPTT: 88 seconds — ABNORMAL HIGH (ref 24–36)
aPTT: 53 seconds — ABNORMAL HIGH (ref 24–36)

## 2021-07-24 LAB — MAGNESIUM: Magnesium: 1.6 mg/dL — ABNORMAL LOW (ref 1.7–2.4)

## 2021-07-24 MED ORDER — MAGNESIUM SULFATE 4 GM/100ML IV SOLN
4.0000 g | Freq: Once | INTRAVENOUS | Status: AC
  Administered 2021-07-24: 4 g via INTRAVENOUS
  Filled 2021-07-24: qty 100

## 2021-07-24 MED ORDER — POTASSIUM CHLORIDE CRYS ER 20 MEQ PO TBCR
40.0000 meq | EXTENDED_RELEASE_TABLET | Freq: Once | ORAL | Status: AC
  Administered 2021-07-24: 40 meq via ORAL
  Filled 2021-07-24: qty 2

## 2021-07-24 MED ORDER — HEPARIN (PORCINE) 25000 UT/250ML-% IV SOLN
1050.0000 [IU]/h | INTRAVENOUS | Status: DC
  Administered 2021-07-24: 900 [IU]/h via INTRAVENOUS
  Administered 2021-07-25 – 2021-07-28 (×4): 1050 [IU]/h via INTRAVENOUS
  Filled 2021-07-24 (×5): qty 250

## 2021-07-24 NOTE — Progress Notes (Signed)
*  PRELIMINARY RESULTS* ?Echocardiogram ?2D Echocardiogram has been performed. ? ?Antonio Cervantes ?07/24/2021, 12:56 PM ?

## 2021-07-24 NOTE — Progress Notes (Signed)
ANTICOAGULATION CONSULT NOTE ? ?Pharmacy Consult for Heparin  ?Indication: chest pain/ACS and atrial fibrillation ? ?No Known Allergies ? ?Patient Measurements: ?Height: 5\' 3"  (160 cm) ?Weight: 70.8 kg (156 lb) (scale b) ?IBW/kg (Calculated) : 56.9 ? ?Vital Signs: ?Temp: 97.5 ?F (36.4 ?C) (03/29 2042) ?Temp Source: Oral (03/29 2042) ?BP: 96/49 (03/29 2042) ?Pulse Rate: 49 (03/29 2042) ? ?Labs: ?Recent Labs  ?  07/22/21 ?2224 07/23/21 ?0010 07/23/21 ?0453 07/23/21 ?[id number] 07/24/21 ?[id number] 07/24/21 ?[id number]  ?HGB 12.4*  --  11.2*  --  10.8*  --   ?HCT 36.9*  --  33.6*  --  32.6*  --   ?PLT 263  --  231  --  184  --   ?APTT  --   --   --  102* 77* 88*  ?LABPROT  --   --  17.7*  --   --   --   ?INR  --   --  1.5*  --   --   --   ?HEPARINUNFRC  --   --   --  >1.10* >1.10*  --   ?CREATININE 1.61*  --  1.39*  --  1.41*  --   ?TROPONINIHS 473* 1,610*  --   --   --   --   ? ? ? ?Estimated Creatinine Clearance: 45.6 mL/min (A) (by C-G formula based on SCr of 1.41 mg/dL (H)). ? ? ?Medical History: ?Past Medical History:  ?Diagnosis Date  ? Diabetes mellitus without complication ([place])   ? Diabetic peripheral neuropathy associated with type 2 diabetes mellitus ([place])   ? ? ? ?Assessment: ?66 y/o M with CP and elevated troponin. On apixaban PTA for afib, last dose was 3/27 at 2000. Pharmacy consulted for heparin.  ?-aPTT at goal on 900 units/hr ? ? ?Goal of Therapy:  ?Heparin level 0.3-0.7 units/ml ?aPTT 66-102 seconds ?Monitor platelets by anticoagulation protocol: Yes ?  ?Plan:  ?Continue heparin  900 units/hr ?Heparin level and aPTT until both correlate ?Monitor daily heparin level, CBC ?Monitor for signs/symptoms of bleeding  ? ?Thank you for involving pharmacy in this patient's care. ? ?Hildred Laser, PharmD ?Clinical Pharmacist ?**Pharmacist phone directory can now be found on [email or web address] ([email or web address]).  Listed under [place]. ? ? ?

## 2021-07-24 NOTE — Consult Note (Signed)
?  ?Advanced Heart Failure Team Consult Note ? ? ?Primary Physician: Antonio Patella, PA ?PCP-Cardiologist:  None ?HF MD: Antonio Cervantes  ? ?Reason for Consultation: Heart Failure  ? ?HPI:   ? ?Antonio Cervantes is seen today for evaluation of heart failure at the request of Antonio Cervantes.  ? ?Antonio Cervantes is a 66 year old with a history of CAD multivessel disease, HFrEF, hypothyroidism, DMII, HLD, tobacco abuse and HFrEF.  ? ?Admitted in January with increase shortness of breath in the setting of NSTEMI + acute HFrEF+ A fib RVR. ECHO EF 20-25%. Cath with multivessel disease and no poor options for re-vascularization. RHC - CO 5.4 CI 2.9. GDMT limited due to soft bp and renal insufficiency.  ? ?Referred to H&V TOC clinic followed by referral to [facility] due to concern for possible advanced therapies. He had a visit on 07/09/21. NYHA II and started on losartan.  ? ?Admitted with chest pain. HS trop [id number]. Started on heparin drip. Antonio Cervantes reviewed with interventional team. MRI and Echo completed today. CT surgery consulted.  ? ?Cardiac Studies  ?Echo 04/2021 EF 20-25% WMA, RV normal, mild Antonio . CO 5.4 CI 2.8  ? ?Cath: 05/21/21  ?  Prox LAD to Mid LAD lesion is 75% stenosed. ?  Dist LAD lesion is 80% stenosed. ?  1st Diag lesion is 80% stenosed. ?  Ost RCA to Mid RCA lesion is 99% stenosed. ?  2nd Mrg lesion is 99% stenosed. ?  Dist Cx lesion is 90% stenosed. ?  Prox Cx to Mid Cx lesion is 75% stenosed. ?  LV end diastolic pressure is normal. ?  There is no aortic valve stenosis. ?Review of Systems: [y] = yes, [ ]  = no  ? ?General: Weight gain [ ] ; Weight loss [ ] ; Anorexia [ ] ; Fatigue [ Y]; Fever [ ] ; Chills [ ] ; Weakness [ ]   ?Cardiac: Chest pain/pressure Antonio Cervantes ]; Resting SOB [ ] ; Exertional SOB [ ] ; Orthopnea [ ] ; Pedal Edema [ ] ; Palpitations [ ] ; Syncope [ ] ; Presyncope [ ] ; Paroxysmal nocturnal dyspnea[ ]   ?Pulmonary: Cough [ ] ; Wheezing[ ] ; Hemoptysis[ ] ; Sputum [ ] ; Snoring [ ]   ?GI: Vomiting[ ] ;  Dysphagia[ ] ; Melena[ ] ; Hematochezia [ ] ; Heartburn[ ] ; Abdominal pain [ ] ; Constipation [ ] ; Diarrhea [ ] ; BRBPR [ ]   ?GU: Hematuria[ ] ; Dysuria [ ] ; Nocturia[ ]   ?Vascular: Pain in legs with walking [ ] ; Pain in feet with lying flat [ ] ; Non-healing sores [ ] ; Stroke [ ] ; TIA [ ] ; Slurred speech [ ] ;  ?Neuro: Headaches[ ] ; Vertigo[ ] ; Seizures[ ] ; Paresthesias[ ] ;Blurred vision [ ] ; Diplopia [ ] ; Vision changes [ ]   ?Ortho/Skin: Arthritis [ y]; Joint pain [ Y]; Muscle pain [ ] ; Joint swelling [ ] ; Back Pain [ ] ; Rash [ ]   ?Psych: Depression[ ] ; Anxiety[ ]   ?Heme: Bleeding problems [ ] ; Clotting disorders [ ] ; Anemia [ ]   ?Endocrine: Diabetes [Y ]; Thyroid dysfunction[ Y] ? ?Home Medications ?Prior to Admission medications   ?Medication Sig Start Date End Date Taking? Authorizing Provider  ?acetaminophen (TYLENOL) 325 MG tablet Take 2 tablets (650 mg total) by mouth every 6 (six) hours as needed. 07/14/16  Yes Antonio Biles, MD  ?allopurinol (ZYLOPRIM) 100 MG tablet Take 100 mg by mouth daily. 06/11/21  Yes [provider]  ?apixaban (ELIQUIS) 5 MG TABS tablet Take 5 mg by mouth daily.   Yes [provider]  ?aspirin 81 MG  EC tablet Take 1 tablet (81 mg total) by mouth daily. Swallow whole. 05/23/21  Yes Antonio Manly, NP  ?atorvastatin (LIPITOR) 80 MG tablet Take 1 tablet (80 mg total) by mouth daily. 05/24/21  Yes Antonio Coss, MD  ?dapagliflozin propanediol (FARXIGA) 10 MG TABS tablet Take 1 tablet (10 mg total) by mouth daily. 05/31/21  Yes Antonio Jester M, PA-C  ?digoxin (LANOXIN) 0.125 MG tablet Take 0.5 tablets (0.0625 mg total) by mouth daily. 05/31/21  Yes Antonio Jester M, PA-C  ?ezetimibe (ZETIA) 10 MG tablet Take 1 tablet (10 mg total) by mouth daily. 06/12/21  Yes Antonio Crome, MD  ?levothyroxine (SYNTHROID) 75 MCG tablet Take 1 tablet (75 mcg total) by mouth daily at 6 (six) AM. 05/24/21  Yes Antonio Coss, MD  ?metoprolol succinate (TOPROL-XL) 25 MG 24 hr tablet Take  1/2 tablet (12.5 mg total) by mouth daily. 05/31/21  Yes Antonio Jester M, PA-C  ?spironolactone (ALDACTONE) 25 MG tablet Take 12.5 mg by mouth daily as needed (fluid).   Yes [provider]  ?furosemide (LASIX) 20 MG tablet TAKE 1 TABLET BY MOUTH EVERY DAY AS NEEDED 07/23/21   Antonio Cervantes, Antonio Pascal, MD  ? ? ?Past Medical History: ?Past Medical History:  ?Diagnosis Date  ? Diabetes mellitus without complication ([place])   ? Diabetic peripheral neuropathy associated with type 2 diabetes mellitus ([place])   ? ? ?Past Surgical History: ?Past Surgical History:  ?Procedure Laterality Date  ? RIGHT/LEFT HEART CATH AND CORONARY ANGIOGRAPHY N/A 05/21/2021  ? Procedure: RIGHT/LEFT HEART CATH AND CORONARY ANGIOGRAPHY;  Surgeon: Antonio Booze, MD;  Location: [facility];  Service: Cardiovascular;  Laterality: N/A;  ? ? ?Family History: ?History reviewed. No pertinent family history. ? ?Social History: ?Social History  ? ?Socioeconomic History  ? Marital status: Married  ?  Spouse name: Not on file  ? Number of children: 1  ? Years of education: Not on file  ? Highest education level: 10th grade  ?Occupational History  ? Occupation: retired  ?Tobacco Use  ? Smoking status: Former  ?  Packs/day: 1.50  ?  Years: 15.00  ?  Pack years: 22.50  ?  Types: Cigarettes  ?  Quit date: 24  ?  Years since quitting: 40.2  ? Smokeless tobacco: Never  ?Vaping Use  ? Vaping Use: Never used  ?Substance and Sexual Activity  ? Alcohol use: No  ? Drug use: No  ? Sexual activity: Not on file  ?Other Topics Concern  ? Not on file  ?Social History Narrative  ? Not on file  ? ?Social Determinants of Health  ? ?Financial Resource Strain: Low Risk   ? Difficulty of Paying Living Expenses: Not very hard  ?Food Insecurity: No Food Insecurity  ? Worried About Charity fundraiser in the Last Year: Never true  ? Ran Out of Food in the Last Year: Never true  ?Transportation Needs: No Transportation Needs  ? Lack of Transportation (Medical): No   ? Lack of Transportation (Non-Medical): No  ?Physical Activity: Not on file  ?Stress: Not on file  ?Social Connections: Not on file  ? ? ?Allergies:  ?No Known Allergies ? ?Objective:   ? ?Vital Signs:   ?Temp:  [98 ?F (36.7 ?C)-99.5 ?F (37.5 ?C)] 98.9 ?F (37.2 ?C) (03/29 1051) ?Pulse Rate:  [50-80] 55 (03/29 1051) ?Resp:  [13-18] 16 (03/29 1051) ?BP: (82-104)/(43-64) 94/54 (03/29 1051) ?SpO2:  [95 %-100 %] 97 % (03/29 1051) ?Weight:  [70.7 kg-70.8  kg] 70.8 kg (03/29 0410) ?Last BM Date : 07/22/21 ? ?Weight change: ?Filed Weights  ? 07/23/21 0430 07/23/21 1700 07/24/21 0410  ?Weight: 71.9 kg 70.7 kg 70.8 kg  ? ? ?Intake/Output:  ? ?Intake/Output Summary (Last 24 hours) at 07/24/2021 1546 ?Last data filed at 07/24/2021 1329 ?Gross per 24 hour  ?Intake 1034.33 ml  ?Output 500 ml  ?Net 534.33 ml  ?  ? ? ?Physical Exam  ?  ?General:  Well appearing. No resp difficulty ?HEENT: normal ?Neck: supple. JVP . Carotids 2+ bilat; no bruits. No lymphadenopathy or thyromegaly appreciated. ?Cor: PMI nondisplaced. Regular rate & rhythm. No rubs, gallops or murmurs. ?Lungs: clear ?Abdomen: soft, nontender, nondistended. No hepatosplenomegaly. No bruits or masses. Good bowel sounds. ?Extremities: no cyanosis, clubbing, rash, edema ?Neuro: alert & orientedx3, cranial nerves grossly intact. moves all 4 extremities w/o difficulty. Affect pleasant ? ? ?Telemetry  ? ?Sinus brady 50s Personally reviewed ? ?EKG  ?  ?Sinus 78 No ST-T wave abnormalities. Personally reviewed ? ?Labs  ? ?Basic Metabolic Panel: ?Recent Labs  ?Lab 07/22/21 ?1433 07/22/21 ?2224 07/23/21 ?0453 07/24/21 ?[id number]  ?NA 140 137 139 138  ?K 4.0 3.9 4.1 3.8  ?CL 111 108 111 111  ?CO2 22 19* 19* 18*  ?GLUCOSE 181* 280* 190* 116*  ?BUN 33* 35* 32* 31*  ?CREATININE 1.43* 1.61* 1.39* 1.41*  ?CALCIUM 9.8 9.3 9.1 8.8*  ?MG  --   --   --  1.6*  ? ? ?Liver Function Tests: ?Recent Labs  ?Lab 07/22/21 ?2224  ?AST 25  ?ALT 20  ?ALKPHOS 57  ?BILITOT 0.7  ?PROT 7.0  ?ALBUMIN 3.7  ? ?No  results for input(s): LIPASE, AMYLASE in the last 168 hours. ?No results for input(s): AMMONIA in the last 168 hours. ? ?CBC: ?Recent Labs  ?Lab 07/22/21 ?2224 07/23/21 ?0453 07/24/21 ?[id number]  ?WBC 10.6*

## 2021-07-24 NOTE — Progress Notes (Signed)
?  Transition of Care (TOC) Screening Note ? ? ?Patient Details  ?Name: Antonio Cervantes ?Date of Birth: [birth year] ? ? ?Transition of Care (TOC) CM/SW Contact:    ?Hiilani Jetter, LCSW ?Phone Number: ?07/24/2021, 10:15 AM ? ? ? ?Transition of Care Department [facility]) has reviewed patient and no TOC needs have been identified at this time. We will continue to monitor patient advancement through interdisciplinary progression rounds. Patient will benefit from PT/OT consult for disposition recommendations. If new patient transition needs arise, please place a TOC consult. ?  ?

## 2021-07-24 NOTE — Progress Notes (Addendum)
ANTICOAGULATION CONSULT NOTE ? ?Pharmacy Consult for Heparin  ?Indication: chest pain/ACS and atrial fibrillation ? ?No Known Allergies ? ?Patient Measurements: ?Height: 5\' 3"  (160 cm) ?Weight: 70.8 kg (156 lb) (scale b) ?IBW/kg (Calculated) : 56.9 ? ?Vital Signs: ?Temp: 99.5 ?F (37.5 ?C) (03/29 0725) ?Temp Source: Oral (03/29 0725) ?BP: 101/64 (03/29 0725) ?Pulse Rate: 52 (03/29 0725) ? ?Labs: ?Recent Labs  ?  07/22/21 ?2224 07/23/21 ?0010 07/23/21 ?0453 07/23/21 ?07/25/21 07/24/21 ?0357  ?HGB 12.4*  --  11.2*  --  10.8*  ?HCT 36.9*  --  33.6*  --  32.6*  ?PLT 263  --  231  --  184  ?APTT  --   --   --  102* 77*  ?LABPROT  --   --  17.7*  --   --   ?INR  --   --  1.5*  --   --   ?HEPARINUNFRC  --   --   --  >1.10* >1.10*  ?CREATININE 1.61*  --  1.39*  --  1.41*  ?TROPONINIHS 473* 1,610*  --   --   --   ? ? ?Estimated Creatinine Clearance: 45.6 mL/min (A) (by C-G formula based on SCr of 1.41 mg/dL (H)). ? ? ?Medical History: ?Past Medical History:  ?Diagnosis Date  ? Diabetes mellitus without complication (H[facility])   ? Diabetic peripheral neuropathy associated with type 2 diabetes mellitus (H[facility])   ? ? ? ?Assessment: ?66 y/o M with CP and elevated troponin. On apixaban PTA for afib, last dose was 3/27 at 2000. Pharmacy consulted for heparin.   ? ?Initial heparin level >1.1 due to influence of apixaban, aPTT is therapeutic at 77 seconds on 900 units/hr.  No issues with infusion or bleeding per RN. ? ?Goal of Therapy:  ?Heparin level 0.3-0.7 units/ml ?aPTT 66-102 seconds ?Monitor platelets by anticoagulation protocol: Yes ?  ?Plan:  ?Continue heparin to 900 units/hr ?F/u confirmatory aPTT in 6hr ?F/u aPTT until correlates with heparin level  ?Monitor daily heparin level, CBC ?Monitor for signs/symptoms of bleeding  ? ?ADDENDUM 10:40: Confirmatory aPTT is 88 and therapeutic. Continue 900 units/hr.  ? ?4/27, PharmD, BCPS, BCCP ?Clinical Pharmacist ? ?Please check AMION for all [facility] Pharmacy phone numbers ?After 10:00 PM,  call Main Pharmacy [phone] ? ?

## 2021-07-24 NOTE — Progress Notes (Signed)
Mobility Specialist Progress Note: ? ? 07/24/21 1615  ?Mobility  ?Activity Ambulated with assistance in hallway  ?Level of Assistance Standby assist, set-up cues, supervision of patient - no hands on  ?Assistive Device None  ?Distance Ambulated (ft) 450 ft  ?Activity Response Tolerated well  ?$Mobility charge 1 Mobility  ? ?Pt agreeable to mobility session. Ambulated at supervision level with no AD. Pt left in chair with all needs met.  ? ?Nelta Numbers ?Acute Rehab ?Phone: [phone] ?Office Phone: [phone] ? ?

## 2021-07-24 NOTE — Progress Notes (Signed)
ANTICOAGULATION CONSULT NOTE ? ?Pharmacy Consult for Heparin  ?Indication: chest pain/ACS and atrial fibrillation ? ?No Known Allergies ? ?Patient Measurements: ?Height: 5\' 3"  (160 cm) ?Weight: 70.8 kg (156 lb) (scale b) ?IBW/kg (Calculated) : 56.9 ? ?Vital Signs: ?Temp: 98.9 ?F (37.2 ?C) (03/29 1051) ?Temp Source: Oral (03/29 1051) ?BP: 94/54 (03/29 1051) ?Pulse Rate: 55 (03/29 1051) ? ?Labs: ?Recent Labs  ?  07/22/21 ?2224 07/23/21 ?0010 07/23/21 ?0453 07/23/21 ?07/25/21 07/24/21 ?07/26/21 07/24/21 ?07/26/21  ?HGB 12.4*  --  11.2*  --  10.8*  --   ?HCT 36.9*  --  33.6*  --  32.6*  --   ?PLT 263  --  231  --  184  --   ?APTT  --   --   --  102* 77* 88*  ?LABPROT  --   --  17.7*  --   --   --   ?INR  --   --  1.5*  --   --   --   ?HEPARINUNFRC  --   --   --  >1.10* >1.10*  --   ?CREATININE 1.61*  --  1.39*  --  1.41*  --   ?TROPONINIHS 473* 1,610*  --   --   --   --   ? ? ? ?Estimated Creatinine Clearance: 45.6 mL/min (A) (by C-G formula based on SCr of 1.41 mg/dL (H)). ? ? ?Medical History: ?Past Medical History:  ?Diagnosis Date  ? Diabetes mellitus without complication (HCC)   ? Diabetic peripheral neuropathy associated with type 2 diabetes mellitus ([facility])   ? ? ? ?Assessment: ?66 y/o M with CP and elevated troponin. On apixaban PTA for afib, last dose was 3/27 at 2000. Pharmacy consulted for heparin. Will need to monitor both heparin level and aPTT until both correlate. Was initially on 900 units/h and therapeutic on that rate. Will restart heparin drip at same rate. ? ?Goal of Therapy:  ?Heparin level 0.3-0.7 units/ml ?aPTT 66-102 seconds ?Monitor platelets by anticoagulation protocol: Yes ?  ?Plan:  ?Start heparin to 900 units/hr ?aPTT in 6h to confirm still therapeutic ?Heparin level and aPTT until both correlate ?Monitor daily heparin level, CBC ?Monitor for signs/symptoms of bleeding  ? ?Thank you for involving pharmacy in this patient's care. ? ?4/27, PharmD ?PGY1 Ambulatory Care Pharmacy  Resident ?07/24/2021 2:38 PM ? ?**Pharmacist phone directory can be found on [email or web address] listed under [facility]** ?

## 2021-07-24 NOTE — Progress Notes (Addendum)
? ? ? ?The patient has been seen in conjunction with Rick Duff, MD. All aspects of care have been considered and discussed. The patient has been personally interviewed, examined, and all clinical data has been reviewed. ? ?We had discussion with the patient this morning.  He understands that treatment options for coronary disease is limited.  After discussion with advanced heart failure team, Dr. Haroldine Laws, we will have a surgical opinion done to determine if the patient could receive LIMA to LAD and SVG to OM if viability study demonstrates significant LV hibernation.  If there is not viability, he would then become a potential candidate for LVAD/heart transplant. ?The advanced heart failure team has been engaged. ? ?Addendum: The viability study is not officially read but there is improvement in EF to 39%, and all territories are viable. ? ?Progress Note ? ?Patient Name: Antonio Cervantes ?Date of Encounter: 07/24/2021 ? ?[facility] Cardiologist: Daneen Schick, MD  ? ?Subjective  ? ?Minimal chest tightness/pressure. No shortness of breath while at rest. He has not had the opportunity to walk the hall much.  ? ?Attending, Dr. Tamala Julian, reviewed cardiac catheterization from January with the CTO team.  He is not a reasonable candidate from anatomy standpoint. ? ?Inpatient Medications  ?  ?Scheduled Meds: ? allopurinol  100 mg Oral Daily  ? aspirin EC  81 mg Oral Daily  ? atorvastatin  80 mg Oral Daily  ? dapagliflozin propanediol  10 mg Oral Daily  ? ezetimibe  10 mg Oral Daily  ? insulin aspart  0-5 Units Subcutaneous QHS  ? insulin aspart  0-9 Units Subcutaneous TID WC  ? isosorbide mononitrate  15 mg Oral Daily  ? levothyroxine  75 mcg Oral Q0600  ? metoprolol succinate  12.5 mg Oral Daily  ? ?Continuous Infusions: ? heparin 900 Units/hr (07/23/21 2212)  ? magnesium sulfate bolus IVPB    ? ?PRN Meds: ?acetaminophen, nitroGLYCERIN, ondansetron (ZOFRAN) IV  ? ?Vital Signs  ?  ?Vitals:  ? 07/24/21 0015 07/24/21  0410 07/24/21 0411 07/24/21 0725  ?BP: (!) 90/43 (!) 82/56 104/61 101/64  ?Pulse: (!) 54 80  (!) 52  ?Resp: 18 18  18   ?Temp: 98 ?F (36.7 ?C) 98 ?F (36.7 ?C)  99.5 ?F (37.5 ?C)  ?TempSrc: Oral Oral  Oral  ?SpO2: 95% 100%  98%  ?Weight:  70.8 kg    ?Height:      ? ? ?Intake/Output Summary (Last 24 hours) at 07/24/2021 0901 ?Last data filed at 07/24/2021 0759 ?Gross per 24 hour  ?Intake 704.33 ml  ?Output 400 ml  ?Net 304.33 ml  ? ? ?  07/24/2021  ?  4:10 AM 07/23/2021  ?  5:00 PM 07/23/2021  ?  4:30 AM  ?Last 3 Weights  ?Weight (lbs) 156 lb 155 lb 13.8 oz 158 lb 8.2 oz  ?Weight (kg) 70.761 kg 70.7 kg 71.9 kg  ?   ? ?Telemetry  ?  ?Normal sinus rhythm- Personally Reviewed ? ?ECG  ?  ?No new EKG - Personally Reviewed ? ?Physical Exam  ? ?GEN: No acute distress.   ?Neck: No JVD ?Cardiac: bradycardic,  soft SEM at RUSB, no rubs, or gallops.  ?Respiratory: Clear to auscultation bilaterally. ?GI: Soft, nontender, non-distended  ?MS: No edema; No deformity. ?Neuro:  Nonfocal  ?Psych: Normal affect  ? ?Labs  ?  ?High Sensitivity Troponin:   ?Recent Labs  ?Lab 07/22/21 ?2224 07/23/21 ?0010  ?TROPONINIHS 473* 1,610*  ?   ?Chemistry ?Recent Labs  ?Lab  07/22/21 ?2224 07/23/21 ?[id number] 07/24/21 ?0357  ?NA 137 139 138  ?K 3.9 4.1 3.8  ?CL 108 111 111  ?CO2 19* 19* 18*  ?GLUCOSE 280* 190* 116*  ?BUN 35* 32* 31*  ?CREATININE 1.61* 1.39* 1.41*  ?CALCIUM 9.3 9.1 8.8*  ?MG  --   --  1.6*  ?PROT 7.0  --   --   ?ALBUMIN 3.7  --   --   ?AST 25  --   --   ?ALT 20  --   --   ?ALKPHOS 57  --   --   ?BILITOT 0.7  --   --   ?GFRNONAA 47* 56* 55*  ?ANIONGAP 10 9 9   ?  ?Lipids  ?Recent Labs  ?Lab 07/23/21 ?[id number]  ?CHOL 73  ?TRIG 108  ?HDL 19*  ?[place] 32  ?CHOLHDL 3.8  ?  ?Hematology ?Recent Labs  ?Lab 07/22/21 ?2224 07/23/21 ?0453 07/24/21 ?[id number]  ?WBC 10.6* 11.9* 10.6*  ?RBC 4.10* 3.72* 3.60*  ?HGB 12.4* 11.2* 10.8*  ?HCT 36.9* 33.6* 32.6*  ?MCV 90.0 90.3 90.6  ?MCH 30.2 30.1 30.0  ?MCHC 33.6 33.3 33.1  ?RDW 14.3 14.3 14.4  ?PLT 263 231 184  ? ?Thyroid  No results for input(s): TSH, FREET4 in the last 168 hours.  ?BNP ?Recent Labs  ?Lab 07/22/21 ?2244  ?BNP 237.3*  ?  ?DDimer No results for input(s): DDIMER in the last 168 hours.  ? ?Radiology  ?  ?DG Chest Portable 1 View ? ?Result Date: 07/22/2021 ?CLINICAL DATA:  Chest pain. EXAM: PORTABLE CHEST 1 VIEW COMPARISON:  May 19, 2021 FINDINGS: The heart size and mediastinal contours are within normal limits. Both lungs are clear. The visualized skeletal structures are unremarkable. IMPRESSION: No active disease. Electronically Signed   By: Virgina Norfolk M.D.   On: 07/22/2021 22:51   ? ?Cardiac Studies  ? ?TTE pending ?  ?TTE 05/20/21 ?IMPRESSIONS ?Septal apical mid/distal anterior wall and inferior apex akinetic suggesting LAD infarct. ?Left ventricular ejection fraction, by estimation, is 20 to 25%. The left ventricle has ?severely decreased function. The left ventricle demonstrates regional wall motion ?abnormalities (see scoring diagram/findings for description). The left ventricular ?internal cavity size was severely dilated. There is mild left ventricular hypertrophy. Left ?ventricular diastolic parameters were normal. ?  ?Right ventricular systolic function is normal. The right ventricular size is normal. ?There is normal pulmonary artery systolic pressure. ?  ?2. The mitral valve is normal in structure. Mild mitral valve regurgitation. No evidence of ?mitral stenosis. ?  ?3. Degree of AS hard to judge due to severe decrease in LV function DVI 0.55 AVA 2 cm2 ?but morphology and gradients suggest AS could be moderate . The aortic valve is ?normal in structure. There is moderate calcification of the aortic valve. There is ?moderate thickening of the aortic valve. Aortic valve regurgitation is mild. Aortic valve ?sclerosis/calcification is present, without any evidence of aortic stenosis. ?  ?4. The inferior vena cava is normal in size with greater than 50% respiratory variability, ?suggesting right atrial  pressure of 3 mmHg. ?  ?Cath: 05/21/21 ?  ?  Prox LAD to Mid LAD lesion is 75% stenosed. ?  Dist LAD lesion is 80% stenosed. ?  1st Diag lesion is 80% stenosed. ?  Ost RCA to Mid RCA lesion is 99% stenosed. ?  2nd Mrg lesion is 99% stenosed. ?  Dist Cx lesion is 90% stenosed. ?  Prox Cx to Mid Cx lesion is 75% stenosed. ?  LV end  diastolic pressure is normal. ?  There is no aortic valve stenosis. ?  ?Severe, multivessel coronary artery disease.  Normal LVEDP. ?  ?Given diffuse, distal vessel disease, there are no good options for revascularization.  Plan for medical therapy to try to improve his LV function.  Probable heart failure consultation as well. ?  ?Diagnostic ?Dominance: Right ?  ? ?Patient Profile  ?   ?66 y.o. 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  Patient is currently not candidate for PCI of his CTO, surgery is reviewing patient to assess potential surgery. Heart failure was also consulted given patient's reduced EF.  ? ?Assessment & Plan  ?  ?#NSTEMI ?#HFrEF 2/2 ICM  ?Patient has some mild chest tightness that is improved from yesterday. He denies SOB at rest, orthopnea, or PND. Team discussed patient's case with CTO team who felt he was a poor candidate for attempted PCI. Heart failure was consulted to discuss potential therapies/transplant. Cardiac surgery was curbsided and are currently reviewing patient's images to determine candidacy for potential CABG.  ?  ?This AM patient states his chest tightness is minimal and improved from yesterday. He has no signs or symptoms of volume overload. His blood pressures remain low in the 0000000 systolic and his HR is mostly in the 50s  precluding GDMT for his rEF heart failure.  ? ?-Reached out to heart failure team for assistance managing patient's HF and to evaluate for potential advanced therapies, esp if cardiac surgery states pati

## 2021-07-25 ENCOUNTER — Inpatient Hospital Stay (HOSPITAL_COMMUNITY): Payer: Medicare Other

## 2021-07-25 ENCOUNTER — Encounter (HOSPITAL_COMMUNITY): Payer: Medicare Other

## 2021-07-25 DIAGNOSIS — I350 Nonrheumatic aortic (valve) stenosis: Secondary | ICD-10-CM | POA: Diagnosis not present

## 2021-07-25 DIAGNOSIS — I5020 Unspecified systolic (congestive) heart failure: Secondary | ICD-10-CM | POA: Diagnosis not present

## 2021-07-25 DIAGNOSIS — I255 Ischemic cardiomyopathy: Secondary | ICD-10-CM | POA: Diagnosis not present

## 2021-07-25 DIAGNOSIS — Z01810 Encounter for preprocedural cardiovascular examination: Secondary | ICD-10-CM

## 2021-07-25 DIAGNOSIS — I214 Non-ST elevation (NSTEMI) myocardial infarction: Secondary | ICD-10-CM | POA: Diagnosis not present

## 2021-07-25 DIAGNOSIS — N179 Acute kidney failure, unspecified: Secondary | ICD-10-CM | POA: Diagnosis not present

## 2021-07-25 DIAGNOSIS — I480 Paroxysmal atrial fibrillation: Secondary | ICD-10-CM | POA: Diagnosis not present

## 2021-07-25 DIAGNOSIS — I25110 Atherosclerotic heart disease of native coronary artery with unstable angina pectoris: Secondary | ICD-10-CM | POA: Diagnosis not present

## 2021-07-25 DIAGNOSIS — I351 Nonrheumatic aortic (valve) insufficiency: Secondary | ICD-10-CM | POA: Diagnosis not present

## 2021-07-25 LAB — GLUCOSE, CAPILLARY
Glucose-Capillary: 122 mg/dL — ABNORMAL HIGH (ref 70–99)
Glucose-Capillary: 170 mg/dL — ABNORMAL HIGH (ref 70–99)
Glucose-Capillary: 131 mg/dL — ABNORMAL HIGH (ref 70–99)
Glucose-Capillary: 154 mg/dL — ABNORMAL HIGH (ref 70–99)

## 2021-07-25 LAB — APTT
aPTT: 58 seconds — ABNORMAL HIGH (ref 24–36)
aPTT: 74 seconds — ABNORMAL HIGH (ref 24–36)
aPTT: 76 seconds — ABNORMAL HIGH (ref 24–36)

## 2021-07-25 LAB — MAGNESIUM: Magnesium: 2.3 mg/dL (ref 1.7–2.4)

## 2021-07-25 LAB — PULMONARY FUNCTION TEST
FEF 25-75 Pre: 1.45 L/sec
FEF2575-%Pred-Pre: 70 %
FEV1-%Pred-Pre: 69 %
FEV1-Pre: 1.77 L
FEV1FVC-%Pred-Pre: 102 %
FEV6-%Pred-Pre: 70 %
FEV6-Pre: 2.3 L
FEV6FVC-%Pred-Pre: 106 %
FVC-%Pred-Pre: 67 %
FVC-Pre: 2.33 L
Pre FEV1/FVC ratio: 76 %
Pre FEV6/FVC Ratio: 100 %

## 2021-07-25 LAB — CBC
HCT: 32.2 % — ABNORMAL LOW (ref 39.0–52.0)
Hemoglobin: 10.8 g/dL — ABNORMAL LOW (ref 13.0–17.0)
MCH: 30.2 pg (ref 26.0–34.0)
MCHC: 33.5 g/dL (ref 30.0–36.0)
MCV: 89.9 fL (ref 80.0–100.0)
Platelets: 212 10*3/uL (ref 150–400)
RBC: 3.58 MIL/uL — ABNORMAL LOW (ref 4.22–5.81)
RDW: 14.6 % (ref 11.5–15.5)
WBC: 12 10*3/uL — ABNORMAL HIGH (ref 4.0–10.5)
nRBC: 0 % (ref 0.0–0.2)

## 2021-07-25 LAB — BASIC METABOLIC PANEL
Anion gap: 8 (ref 5–15)
BUN: 25 mg/dL — ABNORMAL HIGH (ref 8–23)
CO2: 20 mmol/L — ABNORMAL LOW (ref 22–32)
Calcium: 8.8 mg/dL — ABNORMAL LOW (ref 8.9–10.3)
Chloride: 108 mmol/L (ref 98–111)
Creatinine, Ser: 1.61 mg/dL — ABNORMAL HIGH (ref 0.61–1.24)
GFR, Estimated: 47 mL/min — ABNORMAL LOW (ref >60)
Glucose, Bld: 116 mg/dL — ABNORMAL HIGH (ref 70–99)
Potassium: 4.4 mmol/L (ref 3.5–5.1)
Sodium: 136 mmol/L (ref 135–145)

## 2021-07-25 LAB — TSH: TSH: 12.447 u[IU]/mL — ABNORMAL HIGH (ref 0.350–4.500)

## 2021-07-25 LAB — HEPARIN LEVEL (UNFRACTIONATED)
Heparin Unfractionated: >1.1 IU/mL — ABNORMAL HIGH (ref 0.30–0.70)
Heparin Unfractionated: >1.1 IU/mL — ABNORMAL HIGH (ref 0.30–0.70)

## 2021-07-25 LAB — T4, FREE: Free T4: 0.85 ng/dL (ref 0.61–1.12)

## 2021-07-25 MED ORDER — SALINE SPRAY 0.65 % NA SOLN
1.0000 | NASAL | Status: DC | PRN
  Administered 2021-07-25: 1 via NASAL
  Filled 2021-07-25: qty 44

## 2021-07-25 NOTE — Progress Notes (Signed)
ANTICOAGULATION CONSULT NOTE ? ?Pharmacy Consult for Heparin ?Indication: chest pain/ACS and atrial fibrillation ? ?No Known Allergies ? ?Patient Measurements: ?Height: 5\' 3"  (160 cm) ?Weight: 71.3 kg (157 lb 3 oz) ?IBW/kg (Calculated) : 56.9 ? ?Vital Signs: ?Temp: 97.9 ?F (36.6 ?C) (03/30 2110) ?Temp Source: Oral (03/30 2110) ?BP: 110/63 (03/30 2110) ?Pulse Rate: 57 (03/30 2110) ? ?Labs: ?Recent Labs  ?   ?0000 07/23/21 ?0010 07/23/21 ?0453 07/23/21 ?[id number] 07/24/21 ?[id number] 07/24/21 ?[id number] 07/25/21 ?[id number] 07/25/21 ?1359 07/25/21 ?2219  ?HGB   < >  --  11.2*  --  10.8*  --  10.8*  --   --   ?HCT  --   --  33.6*  --  32.6*  --  32.2*  --   --   ?PLT  --   --  231  --  184  --  212  --   --   ?APTT  --   --   --    < > 77*   < > 58* 74* 76*  ?LABPROT  --   --  17.7*  --   --   --   --   --   --   ?INR  --   --  1.5*  --   --   --   --   --   --   ?HEPARINUNFRC  --   --   --    < > >1.10*  --  >1.10*  --  >1.10*  ?CREATININE  --   --  1.39*  --  1.41*  --  1.61*  --   --   ?TROPONINIHS  --  1,610*  --   --   --   --   --   --   --   ? < > = values in this interval not displayed.  ? ? ? ?Estimated Creatinine Clearance: 40 mL/min (A) (by C-G formula based on SCr of 1.61 mg/dL (H)). ? ? ?Assessment: ?66 y/o M with CP and elevated troponin. On apixaban PTA for afib, last dose was 3/27 at 2000. Pharmacy consulted for heparin.  ?  ?Heparin level >1.1 (affected by apixaban), aPTT 76 sec (therapeutic) on infusion at 1050 units/hr. No issues with line or bleeding reported per RN. ? ?Goal of Therapy:  ?Heparin level 0.3-0.7 units/ml ?aPTT 66-102 seconds ?Monitor platelets by anticoagulation protocol: Yes ?  ? ?Plan:  ?Continue heparin infusion at 1050 units/hr ?F/u daily heparin level ? ?Sherlon Handing, PharmD, BCPS ?Please see amion for complete clinical pharmacist phone list ?07/25/2021 11:39 PM ? ?

## 2021-07-25 NOTE — Consult Note (Addendum)
? ?   ?[address] ?      [place] [address] ?            [phone]   ? ?  ?Cardiothoracic Surgery Consultation ? ?Reason for Consult: Severe multi-vessel coronary artery disease and moderate aortic stenosis and insufficiency. ?Referring Physician: Dr. Verdis Prime ? ?Antonio Cervantes is an 66 y.o. male.  ?HPI:  ? ?The patient is a 66 year old gentleman with history of diabetes, hypertension, hyperlipidemia, hypothyroidism, remote tobacco abuse, and coronary artery disease who presented in January 2023 with a NSTEMI.  His troponin peaked at 8787.  Echocardiogram at that time showed an ejection fraction of 20 to 25% with multiple wall motion abnormalities suggesting an LAD infarct.  The aortic valve was moderately calcified and thickened with restricted mobility.  The mean gradient was 10 mmHg with a peak gradient of 17.5 mmHg.  There was moderate aortic insufficiency with pressure half-time of 358 ms.  Cardiac catheterization showed severe three-vessel coronary disease.  The LAD was diffusely diseased with 75% proximal to mid stenosis.  The distal LAD had 80% stenosis.  There is a moderate size first diagonal that had 80% stenosis.  The RCA had 99% ostial to mid stenosis.  Left circumflex had 99% second marginal stenosis and 90% distal stenosis.  LVEDP and PA pressures were normal.  It was felt by cardiology that his vessels were not suitable for PCI and also not felt to be ideal for surgical revascularization.  The patient recovered and was discharged home and said that he has been doing well and remaining active until Sunday when he developed substernal chest tightness and felt like something standing on his chest.  The symptoms were different than his prior MI where he had some chest discomfort but also severe shortness of breath.  His troponin on presentation was 473 and peaked at 1610.  He continued to have some chest heaviness after admission.  Cardiac MRI on 07/23/2021 showed an ejection  fraction of 47% with inferior and inferoseptal as well as apical lateral hypokinesis.  It suggested viable myocardium.  The aortic valve is trileaflet with a valve area measured at 1.38 cm? by planimetry with a mean gradient of 3 mmHg.  Regurgitant fraction was 22%.  A repeat echo yesterday showed an ejection fraction of 50% with a mean aortic valve gradient of 11 mmHg.  AI pressure half-time of 604 ms. ? ?He tells me that he has had no further chest discomfort or shortness of breath.  He has remained active at home up until he had the symptoms Sunday.  He has been mowing grass and doing things around his house.  His energy level has been good.  He lives at home with his 34 year old mother and takes care of her.  He has 1 adult son.  He is a remote smoker.  He has not seen a dentist in many years and said that he thinks he has some bad teeth. ? ?Past Medical History:  ?Diagnosis Date  ? Diabetes mellitus without complication (HCC)   ? Diabetic peripheral neuropathy associated with type 2 diabetes mellitus (HCC)   ? ? ?Past Surgical History:  ?Procedure Laterality Date  ? RIGHT/LEFT HEART CATH AND CORONARY ANGIOGRAPHY N/A 05/21/2021  ? Procedure: RIGHT/LEFT HEART CATH AND CORONARY ANGIOGRAPHY;  Surgeon: Corky Crafts, MD;  Location: [facility] INVASIVE CV LAB;  Service: Cardiovascular;  Laterality: N/A;  ? ? ?History reviewed. No pertinent family history. ? ?Social History:  reports that  he quit smoking about 40 years ago. His smoking use included cigarettes. He has a 22.50 pack-year smoking history. He has never used smokeless tobacco. He reports that he does not drink alcohol and does not use drugs. ? ?Allergies: No Known Allergies ? ?Medications: I have reviewed the patient's current medications. ?Prior to Admission:  ?Medications Prior to Admission  ?Medication Sig Dispense Refill Last Dose  ? acetaminophen (TYLENOL) 325 MG tablet Take 2 tablets (650 mg total) by mouth every 6 (six) hours as needed. 30 tablet 0  Past Month  ? allopurinol (ZYLOPRIM) 100 MG tablet Take 100 mg by mouth daily.   07/22/2021  ? apixaban (ELIQUIS) 5 MG TABS tablet Take 5 mg by mouth daily.   07/22/2021 at 2030  ? aspirin 81 MG EC tablet Take 1 tablet (81 mg total) by mouth daily. Swallow whole. 30 tablet 11 07/22/2021  ? atorvastatin (LIPITOR) 80 MG tablet Take 1 tablet (80 mg total) by mouth daily. 30 tablet 0 07/22/2021  ? dapagliflozin propanediol (FARXIGA) 10 MG TABS tablet Take 1 tablet (10 mg total) by mouth daily. 30 tablet 2 07/22/2021  ? digoxin (LANOXIN) 0.125 MG tablet Take 0.5 tablets (0.0625 mg total) by mouth daily. 30 tablet 1 07/22/2021  ? ezetimibe (ZETIA) 10 MG tablet Take 1 tablet (10 mg total) by mouth daily. 90 tablet 3 07/22/2021  ? levothyroxine (SYNTHROID) 75 MCG tablet Take 1 tablet (75 mcg total) by mouth daily at 6 (six) AM. 30 tablet 0 07/22/2021  ? metoprolol succinate (TOPROL-XL) 25 MG 24 hr tablet Take 1/2 tablet (12.5 mg total) by mouth daily. 30 tablet 1 07/22/2021 at 0900  ? spironolactone (ALDACTONE) 25 MG tablet Take 12.5 mg by mouth daily as needed (fluid).   Past Month  ? furosemide (LASIX) 20 MG tablet TAKE 1 TABLET BY MOUTH EVERY DAY AS NEEDED 30 tablet 7   ? ?Scheduled: ? allopurinol  100 mg Oral Daily  ? aspirin EC  81 mg Oral Daily  ? atorvastatin  80 mg Oral Daily  ? ezetimibe  10 mg Oral Daily  ? insulin aspart  0-5 Units Subcutaneous QHS  ? insulin aspart  0-9 Units Subcutaneous TID WC  ? levothyroxine  75 mcg Oral Q0600  ? metoprolol succinate  12.5 mg Oral Daily  ? ?Continuous: ? heparin 1,050 Units/hr (07/25/21 0622)  ? ?NWG:[id number]:acetaminophen, nitroGLYCERIN, ondansetron (ZOFRAN) IV ? ?Results for orders placed or performed during the hospital encounter of 07/22/21 (from the past 48 hour(s))  ?CBG monitoring, ED     Status: Abnormal  ? Collection Time: 07/23/21 12:53 PM  ?Result Value Ref Range  ? Glucose-Capillary 140 (H) 70 - 99 mg/dL  ?  Comment: Glucose reference range applies only to samples taken after  fasting for at least 8 hours.  ?Hemoglobin A1c     Status: Abnormal  ? Collection Time: 07/23/21  5:07 PM  ?Result Value Ref Range  ? Hgb A1c MFr Bld 8.4 (H) 4.8 - 5.6 %  ?  Comment: (NOTE) ?Pre diabetes:          5.7%-6.4% ? ?Diabetes:              >6.4% ? ?Glycemic control for   <7.0% ?adults with diabetes ?  ? Mean Plasma Glucose 194.38 mg/dL  ?  Comment: Performed at [facility], [address] [place], [place] [address]  ?Glucose, capillary     Status: Abnormal  ? Collection Time: 07/23/21  5:26 PM  ?Result Value  Ref Range  ? Glucose-Capillary 147 (H) 70 - 99 mg/dL  ?  Comment: Glucose reference range applies only to samples taken after fasting for at least 8 hours.  ?Heparin level (unfractionated)     Status: Abnormal  ? Collection Time: 07/23/21  8:58 PM  ?Result Value Ref Range  ? Heparin Unfractionated >1.10 (H) 0.30 - 0.70 IU/mL  ?  Comment: (NOTE) ?The clinical reportable range upper limit is being lowered to >1.10 ?to align with the FDA approved guidance for the current laboratory ?assay. ? ?If heparin results are below expected values, and patient dosage has  ?been confirmed, suggest follow up testing of antithrombin III levels. ?Performed at [facility], [address], [place], [place] ?[address] ?  ?APTT     Status: Abnormal  ? Collection Time: 07/23/21  8:58 PM  ?Result Value Ref Range  ? aPTT 102 (H) 24 - 36 seconds  ?  Comment:        ?IF BASELINE aPTT IS ELEVATED, ?SUGGEST PATIENT RISK ASSESSMENT ?BE USED TO DETERMINE APPROPRIATE ?ANTICOAGULANT THERAPY. ?Performed at [facility], [address] [place], [place] [address] ?  ?Glucose, capillary     Status: Abnormal  ? Collection Time: 07/23/21 10:11 PM  ?Result Value Ref Range  ? Glucose-Capillary 150 (H) 70 - 99 mg/dL  ?  Comment: Glucose reference range applies only to samples taken after fasting for at least 8 hours.  ?Heparin level (unfractionated)     Status: Abnormal  ? Collection Time: 07/24/21  3:57 AM  ?Result Value  Ref Range  ? Heparin Unfractionated >1.10 (H) 0.30 - 0.70 IU/mL  ?  Comment: (NOTE) ?The clinical reportable range upper limit is being lowered to >1.10 ?to align with the FDA approved guidance for the curr

## 2021-07-25 NOTE — Progress Notes (Signed)
Pt just walked, no c/o. Discussed with pt and family IS (will be delivered to room), sternal precautions, mobility, and d/c planning. Pt and family receptive. His mom lives with him and his kids will be helping. Gave materials to view. ?[phone] ?Ethelda Chick CES, ACSM ?2:52 PM ?07/25/2021 ? ?

## 2021-07-25 NOTE — Progress Notes (Signed)
? ?Progress Note ? ?Patient Name: Antonio Cervantes ?Date of Encounter: 07/25/2021 ? ?[facility] Cardiologist: None Daneen Schick ? ?Subjective  ? ?No angina or heart failure symptoms ? ?Appreciate collaborative approach with Dr. Haroldine Laws and Dr. Cyndia Bent. ? ?Inpatient Medications  ?  ?Scheduled Meds: ? allopurinol  100 mg Oral Daily  ? aspirin EC  81 mg Oral Daily  ? atorvastatin  80 mg Oral Daily  ? ezetimibe  10 mg Oral Daily  ? insulin aspart  0-5 Units Subcutaneous QHS  ? insulin aspart  0-9 Units Subcutaneous TID WC  ? levothyroxine  75 mcg Oral Q0600  ? metoprolol succinate  12.5 mg Oral Daily  ? ?Continuous Infusions: ? heparin 1,050 Units/hr (07/25/21 0622)  ? ?PRN Meds: ?acetaminophen, nitroGLYCERIN, ondansetron (ZOFRAN) IV  ? ?Vital Signs  ?  ?Vitals:  ? 07/24/21 1051 07/24/21 2042 07/25/21 0444 07/25/21 0509  ?BP: (!) 94/54 (!) 96/49 (!) 97/51   ?Pulse: (!) 55 (!) 49 66   ?Resp: 16 16 16    ?Temp: 98.9 ?F (37.2 ?C) (!) 97.5 ?F (36.4 ?C) (!) 97 ?F (36.1 ?C)   ?TempSrc: Oral Oral Oral   ?SpO2: 97% 100% 96%   ?Weight:    71.3 kg  ?Height:      ? ? ?Intake/Output Summary (Last 24 hours) at 07/25/2021 1011 ?Last data filed at 07/25/2021 0940 ?Gross per 24 hour  ?Intake 90 ml  ?Output 1175 ml  ?Net -1085 ml  ? ? ?  07/25/2021  ?  5:09 AM 07/24/2021  ?  4:10 AM 07/23/2021  ?  5:00 PM  ?Last 3 Weights  ?Weight (lbs) 157 lb 3 oz 156 lb 155 lb 13.8 oz  ?Weight (kg) 71.3 kg 70.761 kg 70.7 kg  ?   ? ?Telemetry  ?  ?Normal sinus rhythm- Personally Reviewed ? ?ECG  ?  ?New study not repeated- Personally Reviewed ? ?Physical Exam  ?Bearded ?GEN: No acute distress.   ?Neck: No JVD ?Cardiac: RRR, no murmurs, rubs, or gallops.  ?Respiratory: Clear to auscultation bilaterally. ?GI: Soft, nontender, non-distended  ?MS: No edema; No deformity. ?Neuro:  Nonfocal  ?Psych: Normal affect  ? ?Labs  ?  ?High Sensitivity Troponin:   ?Recent Labs  ?Lab 07/22/21 ?2224 07/23/21 ?0010  ?TROPONINIHS 473* 1,610*  ?   ?Chemistry ?Recent Labs   ?Lab 07/22/21 ?2224 07/23/21 ?0453 07/24/21 ?[id number] 07/25/21 ?[id number]  ?NA 137 139 138 136  ?K 3.9 4.1 3.8 4.4  ?CL 108 111 111 108  ?CO2 19* 19* 18* 20*  ?GLUCOSE 280* 190* 116* 116*  ?BUN 35* 32* 31* 25*  ?CREATININE 1.61* 1.39* 1.41* 1.61*  ?CALCIUM 9.3 9.1 8.8* 8.8*  ?MG  --   --  1.6* 2.3  ?PROT 7.0  --   --   --   ?ALBUMIN 3.7  --   --   --   ?AST 25  --   --   --   ?ALT 20  --   --   --   ?ALKPHOS 57  --   --   --   ?BILITOT 0.7  --   --   --   ?GFRNONAA 47* 56* 55* 47*  ?ANIONGAP 10 9 9 8   ?  ?Lipids  ?Recent Labs  ?Lab 07/23/21 ?[id number]  ?CHOL 73  ?TRIG 108  ?HDL 19*  ?[place] 32  ?CHOLHDL 3.8  ?  ?Hematology ?Recent Labs  ?Lab 07/23/21 ?0453 07/24/21 ?0357 07/25/21 ?[id number]  ?WBC 11.9* 10.6* 12.0*  ?RBC 3.72*  3.60* 3.58*  ?HGB 11.2* 10.8* 10.8*  ?HCT 33.6* 32.6* 32.2*  ?MCV 90.3 90.6 89.9  ?MCH 30.1 30.0 30.2  ?MCHC 33.3 33.1 33.5  ?RDW 14.3 14.4 14.6  ?PLT 231 184 212  ? ?Thyroid  ?Recent Labs  ?Lab 07/25/21 ?[id number]  ?TSH 12.447*  ?FREET4 0.85  ?  ?BNP ?Recent Labs  ?Lab 07/22/21 ?2244  ?BNP 237.3*  ?  ?DDimer No results for input(s): DDIMER in the last 168 hours.  ? ?Radiology  ?  ?MR CARDIAC MORPHOLOGY W WO CONTRAST ? ?Result Date: 07/24/2021 ?CLINICAL DATA:  Clinical question of cardiac viability Study assumes HCT of 33 and BSA of 1.77 m2. EXAM: CARDIAC MRI TECHNIQUE: The patient was scanned on a 1.5 Tesla GE magnet. A dedicated cardiac coil was used. Functional imaging was done using Fiesta sequences. 2,3, and 4 chamber views were done to assess for RWMA's. Modified Simpson's rule using a short axis stack was used to calculate an ejection fraction on a dedicated work Conservation officer, nature. The patient received 10 cc of Gadavist. After 10 minutes inversion recovery sequences were used to assess for infiltration and scar tissue. CONTRAST:  10 cc  of Gadavist FINDINGS: 1. Normal left ventricular size, with LVEDD 42 mm, and LVEDVi 71 mL/m2. Mild asymmetric remodeling, with intraventricular septal thickness of  11 mm, posterior wall thickness of 9 mm, and myocardial mass index of 75 g/m2. Mildly reduced left ventricular systolic function (LVEF [id number]). There are regional wall motion abnormalities: Inferior and inferoseptal wall motion abnormalities, apical lateral hypokinesis. Suggestive of multi-vessel disease. Left ventricular parametric mapping notable for increase in T2 signal in the basal anterior and inferior (64 ms), mid anterior and inferolateral (61 ms), apical inferior and lateral (69 ms). Increase ECV signal in basal anterior (39%) and inferior (74%), mid inferolateral (35%) and apical lateral (39%). There is late gadolinium enhancement in the left ventricular myocardium. Basal inferior and inferoseptal, mid inferior LGE, apical lateral LGE. LGE signal is endomyocardial in nature and represents 25% thickness or less. 2. Normal right ventricular size with RVEDVI 52 mL/m2. Normal right ventricular thickness. Normal right ventricular systolic function (RVEF [id number]). There are no regional wall motion abnormalities or aneurysms. 3.  Normal left and right atrial size. 4. Normal size of the aortic root, ascending aorta and pulmonary artery. 5. Valve assessment: Aortic Valve: Tri-leaflet aortic valve. AVA 1.38 cm2 by 2D planimetry. Regurgitant fraction 22%, mean gradient 3 mm Hg. Mild to moderate aortic stenosis with moderate aortic regurgitation. Pulmonic Valve: There is no significant regurgitation. Regurgitant fraction 3%, mean gradient < 1 mmHg. Tricuspid Valve: Qualitatively, there is no significant regurgitation. Mitral Valve: There is no significant regurgitation. 6.  Normal pericardium.  No pericardial effusion. 7. Grossly, no extracardiac findings. Recommended dedicated study if concerned for non-cardiac pathology. IMPRESSION: There is mild decrease in LV function. Study suggestive of viable, multi-vessel disease. Moderate aortic regurgitation by velocity encoding. Rudean Haskell MD Electronically Signed    By: Rudean Haskell M.D.   On: 07/24/2021 18:19  ? ?ECHOCARDIOGRAM COMPLETE ? ?Result Date: 07/24/2021 ?   ECHOCARDIOGRAM REPORT   Patient Name:   Antonio Cervantes Date of Exam: 07/24/2021 Medical Rec #:  [id number]         Height:       63.0 in Accession #:    [id number]        Weight:       156.0 lb Date of Birth:  1956-03-24  BSA:          1.740 m? Patient Age:    66 years          BP:           110/55 mmHg Patient Gender: M                 HR:           52 bpm. Exam Location:  Inpatient Procedure: 2D Echo, Cardiac Doppler and Color Doppler Indications:    Acute ischemic heart disease, unspecified I24.9  History:        Patient has prior history of Echocardiogram examinations, most                 recent 05/20/2021. Cardiomyopathy, Previous Myocardial Infarction                 and CAD, Arrythmias:Atrial Fibrillation; Risk Factors:Diabetes                 and Dyslipidemia. HFrEF (heart failure with reduced ejection                 fraction) ([place]).  Sonographer:    Alvino Chapel RCS Referring Phys: [id number] [place]  1. Left ventricular ejection fraction, by estimation, is 50%. The left ventricle has low normal function. The left ventricle has no regional wall motion abnormalities. There is mild left ventricular hypertrophy. Left ventricular diastolic parameters are  consistent with Grade II diastolic dysfunction (pseudonormalization).  2. Right ventricular systolic function is normal. The right ventricular size is normal. Tricuspid regurgitation signal is inadequate for assessing PA pressure.  3. Left atrial size was mildly dilated.  4. The mitral valve is normal in structure. Trivial mitral valve regurgitation. No evidence of mitral stenosis.  5. The aortic valve is abnormal. There is moderate calcification of the aortic valve. Aortic valve regurgitation is mild. Mild aortic valve stenosis. Aortic valve mean gradient measures 11.0 mmHg.  6. The inferior vena cava is normal in size  with greater than 50% respiratory variability, suggesting right atrial pressure of 3 mmHg. FINDINGS  Left Ventricle: Left ventricular ejection fraction, by estimation, is 50%. The left ventricle has low n

## 2021-07-25 NOTE — Progress Notes (Addendum)
? ? Advanced Heart Failure Rounding Note ? ?PCP-Cardiologist: None  ? ?Subjective:   ? ?NSTEMI>>Cath MVCAD w/ poor PCI targets. cMRI suggest viable myocardium, mod AI, LVEF 47%. Awaiting CT consult for CABG.  ? ?Remains on heparin. Currently CP free. No dyspnea.  ? ?Has AKI, SCr 1.39>>1.41>>1.61. SBPs 90s  ? ?WBC 10>>12K. AF. Complains of nasal congestion. Denies cough/fever/chills. ? Reactive.  ? ?cMRI ?IMPRESSION: ?There is mild decrease in LV function. EF 47% ?  ?Study suggestive of viable, multi-vessel disease. ?  ?Moderate aortic regurgitation by velocity encoding. ? ? ? ?Objective:   ?Weight Range: ?71.3 kg ?Body mass index is 27.84 kg/m?.  ? ?Vital Signs:   ?Temp:  [97 ?F (36.1 ?C)-99.5 ?F (37.5 ?C)] 97 ?F (36.1 ?C) (03/30 0444) ?Pulse Rate:  [49-66] 66 (03/30 0444) ?Resp:  [16-18] 16 (03/30 0444) ?BP: (94-101)/(49-64) 97/51 (03/30 0444) ?SpO2:  [96 %-100 %] 96 % (03/30 0444) ?Weight:  [71.3 kg] 71.3 kg (03/30 0509) ?Last BM Date : 07/22/21 ? ?Weight change: ?Filed Weights  ? 07/23/21 1700 07/24/21 0410 07/25/21 0509  ?Weight: 70.7 kg 70.8 kg 71.3 kg  ? ? ?Intake/Output:  ? ?Intake/Output Summary (Last 24 hours) at 07/25/2021 [id number] ?Last data filed at 07/25/2021 0445 ?Gross per 24 hour  ?Intake 570 ml  ?Output 1050 ml  ?Net -480 ml  ?  ? ? ?Physical Exam  ?  ?General:  Well appearing. No resp difficulty ?HEENT: Normal ?Neck: Supple. JVP 6 cm . Carotids 2+ bilat; no bruits. No lymphadenopathy or thyromegaly appreciated. ?Cor: PMI nondisplaced. Regular rate & rhythm. No rubs, gallops or murmurs. ?Lungs: Clear ?Abdomen: Soft, nontender, nondistended. No hepatosplenomegaly. No bruits or masses. Good bowel sounds. ?Extremities: No cyanosis, clubbing, rash, edema ?Neuro: Alert & orientedx3, cranial nerves grossly intact. moves all 4 extremities w/o difficulty. Affect pleasant ? ? ?Telemetry  ? ?NSR 80s, personally reviewed  ? ?EKG  ?  ?No new EKG to review  ? ?Labs  ?  ?CBC ?Recent Labs  ?  07/22/21 ?2224  07/23/21 ?0453 07/24/21 ?0357 07/25/21 ?[id number]  ?WBC 10.6*   < > 10.6* 12.0*  ?NEUTROABS 7.7  --   --   --   ?HGB 12.4*   < > 10.8* 10.8*  ?HCT 36.9*   < > 32.6* 32.2*  ?MCV 90.0   < > 90.6 89.9  ?PLT 263   < > 184 212  ? < > = values in this interval not displayed.  ? ?Basic Metabolic Panel ?Recent Labs  ?  07/24/21 ?0357 07/25/21 ?[id number]  ?NA 138 136  ?K 3.8 4.4  ?CL 111 108  ?CO2 18* 20*  ?GLUCOSE 116* 116*  ?BUN 31* 25*  ?CREATININE 1.41* 1.61*  ?CALCIUM 8.8* 8.8*  ?MG 1.6* 2.3  ? ?Liver Function Tests ?Recent Labs  ?  07/22/21 ?2224  ?AST 25  ?ALT 20  ?ALKPHOS 57  ?BILITOT 0.7  ?PROT 7.0  ?ALBUMIN 3.7  ? ?No results for input(s): LIPASE, AMYLASE in the last 72 hours. ?Cardiac Enzymes ?No results for input(s): CKTOTAL, CKMB, CKMBINDEX, TROPONINI in the last 72 hours. ? ?BNP: ?BNP (last 3 results) ?Recent Labs  ?  05/19/21 ?1847 07/22/21 ?2244  ?BNP 814.9* 237.3*  ? ? ?ProBNP (last 3 results) ?No results for input(s): PROBNP in the last 8760 hours. ? ? ?D-Dimer ?No results for input(s): DDIMER in the last 72 hours. ?Hemoglobin A1C ?Recent Labs  ?  07/23/21 ?1707  ?HGBA1C 8.4*  ? ?Fasting Lipid Panel ?Recent Labs  ?  07/23/21 ?0453  ?CHOL 73  ?HDL 19*  ?[place] 32  ?TRIG 108  ?CHOLHDL 3.8  ? ?Thyroid Function Tests ?Recent Labs  ?  07/25/21 ?[id number]  ?TSH 12.447*  ? ? ?Other results: ? ? ?Imaging  ? ? ?ECHOCARDIOGRAM COMPLETE ? ?Result Date: 07/24/2021 ?   ECHOCARDIOGRAM REPORT   Patient Name:   GEORDI BLYTHER Date of Exam: 07/24/2021 Medical Rec #:  [id number]         Height:       63.0 in Accession #:    [id number]        Weight:       156.0 lb Date of Birth:  Oct 14, 1955        BSA:          1.740 m? Patient Age:    66 years          BP:           110/55 mmHg Patient Gender: M                 HR:           52 bpm. Exam Location:  Inpatient Procedure: 2D Echo, Cardiac Doppler and Color Doppler Indications:    Acute ischemic heart disease, unspecified I24.9  History:        Patient has prior history of Echocardiogram  examinations, most                 recent 05/20/2021. Cardiomyopathy, Previous Myocardial Infarction                 and CAD, Arrythmias:Atrial Fibrillation; Risk Factors:Diabetes                 and Dyslipidemia. HFrEF (heart failure with reduced ejection                 fraction) ([place]).  Sonographer:    Alvino Chapel RCS Referring Phys: [id number] [place]  1. Left ventricular ejection fraction, by estimation, is 50%. The left ventricle has low normal function. The left ventricle has no regional wall motion abnormalities. There is mild left ventricular hypertrophy. Left ventricular diastolic parameters are  consistent with Grade II diastolic dysfunction (pseudonormalization).  2. Right ventricular systolic function is normal. The right ventricular size is normal. Tricuspid regurgitation signal is inadequate for assessing PA pressure.  3. Left atrial size was mildly dilated.  4. The mitral valve is normal in structure. Trivial mitral valve regurgitation. No evidence of mitral stenosis.  5. The aortic valve is abnormal. There is moderate calcification of the aortic valve. Aortic valve regurgitation is mild. Mild aortic valve stenosis. Aortic valve mean gradient measures 11.0 mmHg.  6. The inferior vena cava is normal in size with greater than 50% respiratory variability, suggesting right atrial pressure of 3 mmHg. FINDINGS  Left Ventricle: Left ventricular ejection fraction, by estimation, is 50%. The left ventricle has low normal function. The left ventricle has no regional wall motion abnormalities. The left ventricular internal cavity size was normal in size. There is mild left ventricular hypertrophy. Left ventricular diastolic parameters are consistent with Grade II diastolic dysfunction (pseudonormalization). Right Ventricle: The right ventricular size is normal. No increase in right ventricular wall thickness. Right ventricular systolic function is normal. Tricuspid regurgitation signal is  inadequate for assessing PA pressure. Left Atrium: Left atrial size was mildly dilated. Right Atrium: Right atrial size was normal in size. Pericardium: There is no evidence of pericardial effusion. Mitral Valve: The  mitral valve is normal in structure. Trivial mitral valve regurgitation. No evidence of mitral valve stenosis. Tricuspid Valve: The tricuspid valve is normal in structure. Tricuspid valve regurgitation is trivial. No evidence of tricuspid stenosis. Aortic Valve: The aortic valve is abnormal. There is moderate calcification of the aortic valve. Aortic valve regurgitation is mild. Aortic regurgitation PHT measures 604 msec. Mild aortic stenosis is present. Aortic valve mean gradient measures 11.0 mmHg. Aortic valve peak gradient measures 21.0 mmHg. Aortic valve area, by VTI measures 1.18 cm?. Pulmonic Valve: The pulmonic valve was grossly normal. Pulmonic valve regurgitation is trivial. No evidence of pulmonic stenosis. Aorta: The aortic root is normal in size and structure. Venous: The inferior vena cava is normal in size with greater than 50% respiratory variability, suggesting right atrial pressure of 3 mmHg. IAS/Shunts: The interatrial septum appears to be lipomatous. No atrial level shunt detected by color flow Doppler.  LEFT VENTRICLE PLAX 2D LVIDd:         4.59 cm   Diastology LVIDs:         3.16 cm   LV e' medial:    6.42 cm/s LV PW:         1.12 cm   LV E/e' medial:  16.5 LV IVS:        1.15 cm   LV e' lateral:   7.83 cm/s LVOT diam:     2.10 cm   LV E/e' lateral: 13.5 LV SV:         63 LV SV Index:   36 LVOT Area:     3.46 cm?  RIGHT VENTRICLE RV Mid diam:    2.90 cm RV S prime:     10.90 cm/s TAPSE (M-mode): 2.0 cm LEFT ATRIUM             Index        RIGHT ATRIUM           Index LA diam:        3.40 cm 1.95 cm/m?   RA Area:     15.20 cm? 8.74 cm?/m? LA Vol (A2C):   62.9 ml 36.15 ml/m?  RA Volume:   32.40 ml  18.62 ml/m? LA Vol (A4C):   63.6 ml 36.55 ml/m? LA Biplane Vol: 68.5 ml 39.37 ml/m?   AORTIC VALVE AV Area (Vmax):    1.47 cm? AV Area (Vmean):   1.41 cm? AV Area (VTI):     1.18 cm? AV Vmax:           229.00 cm/s AV Vmean:          157.000 cm/s AV VTI:            0.538 m AV Peak Grad:      21.0 mmHg

## 2021-07-25 NOTE — Progress Notes (Signed)
Mobility Specialist Progress Note: ? ? 07/25/21 1400  ?Mobility  ?Activity Ambulated with assistance in hallway  ?Level of Assistance Standby assist, set-up cues, supervision of patient - no hands on  ?Assistive Device None  ?Distance Ambulated (ft) 450 ft  ?Activity Response Tolerated well  ?$Mobility charge 1 Mobility  ? ?Pt asx during ambulation. Left sitting in chair with all needs met.  ? ?Nelta Numbers ?Acute Rehab ?Phone: [phone] ?Office Phone: [phone] ? ?

## 2021-07-25 NOTE — TOC Initial Note (Signed)
Transition of Care (TOC) - Initial/Assessment Note  ? ? ?Patient Details  ?Name: Antonio Cervantes ?MRN: [id number] ?Date of Birth: 17-Mar-1956 ? ?Transition of Care [facility]) CM/SW Contact:    ?Antonio Cervantes Antonio Alberts, RN ?Phone Number: [phone] ?07/25/2021, 4:53 PM ? ?Clinical Narrative:                 ?HF TOC CM spoke to pt at bedside. Pt states his son and mother lives in the home. Pt was independent prior to hospital stay. Will continue to follow for dc needs.  ? ?  ?Barriers to Discharge: Continued Medical Work up ? ? ?Patient Goals and CMS Choice ?Patient states their goals for this hospitalization and ongoing recovery are:: wants to get well ?CMS Medicare.gov Compare Post Acute Care list provided to:: Patient ?  ? ?Expected Discharge Plan and Services ?  ? ?Prior Living Arrangements/Services ?  ?  ?Patient language and need for interpreter reviewed:: Yes ?Do you feel safe going back to the place where you live?: Yes      ?Need for Family Participation in Patient Care: No (Comment) ?Care giver support system in place?: No (comment) ?  ?Criminal Activity/Legal Involvement Pertinent to Current Situation/Hospitalization: No - Comment as needed ? ?Activities of Daily Living ?Home Assistive Devices/Equipment: CBG Meter ?ADL Screening (condition at time of admission) ?Patient's cognitive ability adequate to safely complete daily activities?: Yes ?Is the patient deaf or have difficulty hearing?: No ?Does the patient have difficulty seeing, even when wearing glasses/contacts?: Yes ?Does the patient have difficulty concentrating, remembering, or making decisions?: No ?Patient able to express need for assistance with ADLs?: Yes ?Does the patient have difficulty dressing or bathing?: No ?Independently performs ADLs?: Yes (appropriate for developmental age) ?Does the patient have difficulty walking or climbing stairs?: No ?Weakness of Legs: Both ?Weakness of Arms/Hands: None ? ?Permission Sought/Granted ?Permission sought to  share information with : Case Manager, Family Supports, PCP ?Permission granted to share information with : Yes, Verbal Permission Granted ? Share Information with NAME: Antonio Cervantes ?   ? Permission granted to share info w Relationship: son ? Permission granted to share info w Contact Information: [phone] ? ?Emotional Assessment ?Appearance:: Appears stated age ?Attitude/Demeanor/Rapport: Engaged ?Affect (typically observed): Accepting ?Orientation: : Oriented to Self, Oriented to Place, Oriented to  Time, Oriented to Situation ?  ?Psych Involvement: No (comment) ? ?Admission diagnosis:  NSTEMI (non-ST elevated myocardial infarction) ([place]) [I21.4] ?Patient Active Problem List  ? Diagnosis Date Noted  ? Aortic stenosis 07/24/2021  ? NSTEMI (non-ST elevated myocardial infarction) ([place]) 07/23/2021  ? Ischemic cardiomyopathy 07/23/2021  ? HFrEF (heart failure with reduced ejection fraction) ([place]) 07/23/2021  ? Hyperlipidemia 06/11/2021  ? Non-ST elevation (NSTEMI) myocardial infarction ([place]) 06/11/2021  ? Acute systolic heart failure ([place])   ? Flash pulmonary edema ([place]) 05/19/2021  ? Acute respiratory failure with hypoxia ([place]) 05/19/2021  ? Diabetic peripheral neuropathy associated with type 2 diabetes mellitus ([place]) 05/19/2021  ? AKI (acute kidney injury) ([place]) 05/19/2021  ? Unspecified atrial fibrillation ([place]) 05/19/2021  ? DM (diabetes mellitus), type 2 ([place]) 05/19/2021  ? ?PCP:  Antonio Patella, PA ?Pharmacy:   ?[id number], [place] ?[address] ?[place] [address] ?Phone: [phone] Fax: [phone] ? ?[facility] ?[address] ?[place] [address] ?Phone: [phone] Fax: [phone] ? ? ? ? ?Social Determinants of Health (SDOH) Interventions ?  ? ?Readmission Risk Interventions ?   ?  View : No data to display.  ?  ?  ?  ? ? ? ?

## 2021-07-25 NOTE — Progress Notes (Signed)
ANTICOAGULATION CONSULT NOTE ? ?Pharmacy Consult for Heparin ?Indication: chest pain/ACS and atrial fibrillation ? ?No Known Allergies ? ?Patient Measurements: ?Height: 5\' 3"  (160 cm) ?Weight: 71.3 kg (157 lb 3 oz) ?IBW/kg (Calculated) : 56.9 ? ?Heparin Dosing Weight: 57 kg ? ?Vital Signs: ?Temp: 97.6 ?F (36.4 ?C) (03/30 1206) ?Temp Source: Oral (03/30 1206) ?BP: 88/58 (03/30 1205) ?Pulse Rate: 55 (03/30 1205) ? ?Labs: ?Recent Labs  ?  07/22/21 ?2224 07/23/21 ?0010 07/23/21 ?07/25/21 07/23/21 ?07/25/21 07/23/21 ?07/25/21 07/24/21 ?07/26/21 07/24/21 ?07/26/21 07/24/21 ?2122 07/25/21 ?07/27/21 07/25/21 ?1359  ?HGB 12.4*  --  11.2*  --   --  10.8*  --   --  10.8*  --   ?HCT 36.9*  --  33.6*  --   --  32.6*  --   --  32.2*  --   ?PLT 263  --  231  --   --  184  --   --  212  --   ?APTT  --   --   --    < > 102* 77*   < > 53* 58* 74*  ?LABPROT  --   --  17.7*  --   --   --   --   --   --   --   ?INR  --   --  1.5*  --   --   --   --   --   --   --   ?HEPARINUNFRC  --   --   --   --  >1.10* >1.10*  --   --  >1.10*  --   ?CREATININE 1.61*  --  1.39*  --   --  1.41*  --   --  1.61*  --   ?TROPONINIHS 473* 1,610*  --   --   --   --   --   --   --   --   ? < > = values in this interval not displayed.  ? ? ?Estimated Creatinine Clearance: 40 mL/min (A) (by C-G formula based on SCr of 1.61 mg/dL (H)). ? ? ?Assessment: ?66 y/o M with CP and elevated troponin. On apixaban PTA for afib, last dose was 3/27 at 2000. Pharmacy consulted for heparin.  ?  ?Heparin level >1.1 (affected by apixaban), aPTT 74 sec (subtherapeutic) on infusion at 1050 units/hr. No issues with line or bleeding reported per RN. ? ?Goal of Therapy:  ?Heparin level 0.3-0.7 units/ml ?aPTT 66-102 seconds ?Monitor platelets by anticoagulation protocol: Yes ?  ? ?Plan:  ?Continue heparin infusion at 1050 units/hr ?Check heparin level in 6 hours and daily while on heparin ?Continue to monitor H&H and platelets ? ? ? ?Thank you for allowing pharmacy to be a part of this patient?s  care. ? ?4/27, PharmD ?Clinical Pharmacist ? ?

## 2021-07-25 NOTE — Progress Notes (Signed)
ANTICOAGULATION CONSULT NOTE ? ?Pharmacy Consult for Heparin  ?Indication: chest pain/ACS and atrial fibrillation ? ?No Known Allergies ? ?Patient Measurements: ?Height: 5\' 3"  (160 cm) ?Weight: 71.3 kg (157 lb 3 oz) ?IBW/kg (Calculated) : 56.9 ? ?Vital Signs: ?Temp: 97 ?F (36.1 ?C) (03/30 0444) ?Temp Source: Oral (03/30 0444) ?BP: 97/51 (03/30 0444) ?Pulse Rate: 66 (03/30 0444) ? ?Labs: ?Recent Labs  ?  07/22/21 ?2224 07/23/21 ?0010 07/23/21 ?[id number] 07/23/21 ?[id number] 07/23/21 ?[id number] 07/24/21 ?[id number] 07/24/21 ?[id number] 07/24/21 ?2122 07/25/21 ?[id number]  ?HGB 12.4*  --  11.2*  --   --  10.8*  --   --  10.8*  ?HCT 36.9*  --  33.6*  --   --  32.6*  --   --  32.2*  ?PLT 263  --  231  --   --  184  --   --  212  ?APTT  --   --   --    < > 102* 77* 88* 53* 58*  ?LABPROT  --   --  17.7*  --   --   --   --   --   --   ?INR  --   --  1.5*  --   --   --   --   --   --   ?HEPARINUNFRC  --   --   --   --  >1.10* >1.10*  --   --  >1.10*  ?CREATININE 1.61*  --  1.39*  --   --  1.41*  --   --  1.61*  ?TROPONINIHS 473* 1,610*  --   --   --   --   --   --   --   ? < > = values in this interval not displayed.  ? ? ? ?Estimated Creatinine Clearance: 40 mL/min (A) (by C-G formula based on SCr of 1.61 mg/dL (H)). ? ? ?Assessment: ?66 y/o M with CP and elevated troponin. On apixaban PTA for afib, last dose was 3/27 at 2000. Pharmacy consulted for heparin.  ?-aPTT at goal on 900 units/hr ? ?Heparin level >1.1 (affected by apixaban), aPTT 58 sec (subtherapeutic) on infusion at 900 units/hr. No issues with line or bleeding reported per RN. ? ?Goal of Therapy:  ?Heparin level 0.3-0.7 units/ml ?aPTT 66-102 seconds ?Monitor platelets by anticoagulation protocol: Yes ?  ?Plan:  ?Increase heparin to 1050 units/hr ?F/u 8 heparin level ? ?Sherlon Handing, PharmD, BCPS ?Please see amion for complete clinical pharmacist phone list ?07/25/2021 6:13 AM ? ? ?

## 2021-07-25 NOTE — Progress Notes (Signed)
VASCULAR LAB ? ? ? ?Pre CABG Dopplers have been performed. ? ?See CV proc for preliminary results. ? ? ?Yazlyn Wentzel, RVT ?07/25/2021, 11:33 AM ? ?

## 2021-07-26 DIAGNOSIS — I351 Nonrheumatic aortic (valve) insufficiency: Secondary | ICD-10-CM | POA: Diagnosis not present

## 2021-07-26 DIAGNOSIS — I350 Nonrheumatic aortic (valve) stenosis: Secondary | ICD-10-CM | POA: Diagnosis not present

## 2021-07-26 DIAGNOSIS — I255 Ischemic cardiomyopathy: Secondary | ICD-10-CM | POA: Diagnosis not present

## 2021-07-26 DIAGNOSIS — I25110 Atherosclerotic heart disease of native coronary artery with unstable angina pectoris: Secondary | ICD-10-CM | POA: Diagnosis not present

## 2021-07-26 DIAGNOSIS — I5020 Unspecified systolic (congestive) heart failure: Secondary | ICD-10-CM | POA: Diagnosis not present

## 2021-07-26 DIAGNOSIS — I214 Non-ST elevation (NSTEMI) myocardial infarction: Secondary | ICD-10-CM | POA: Diagnosis not present

## 2021-07-26 LAB — CBC
HCT: 32.2 % — ABNORMAL LOW (ref 39.0–52.0)
Hemoglobin: 10.7 g/dL — ABNORMAL LOW (ref 13.0–17.0)
MCH: 30.1 pg (ref 26.0–34.0)
MCHC: 33.2 g/dL (ref 30.0–36.0)
MCV: 90.4 fL (ref 80.0–100.0)
Platelets: 209 10*3/uL (ref 150–400)
RBC: 3.56 MIL/uL — ABNORMAL LOW (ref 4.22–5.81)
RDW: 14.5 % (ref 11.5–15.5)
WBC: 12.5 10*3/uL — ABNORMAL HIGH (ref 4.0–10.5)
nRBC: 0 % (ref 0.0–0.2)

## 2021-07-26 LAB — GLUCOSE, CAPILLARY
Glucose-Capillary: 107 mg/dL — ABNORMAL HIGH (ref 70–99)
Glucose-Capillary: 149 mg/dL — ABNORMAL HIGH (ref 70–99)
Glucose-Capillary: 108 mg/dL — ABNORMAL HIGH (ref 70–99)
Glucose-Capillary: 149 mg/dL — ABNORMAL HIGH (ref 70–99)

## 2021-07-26 LAB — BASIC METABOLIC PANEL
Anion gap: 9 (ref 5–15)
BUN: 23 mg/dL (ref 8–23)
CO2: 21 mmol/L — ABNORMAL LOW (ref 22–32)
Calcium: 8.6 mg/dL — ABNORMAL LOW (ref 8.9–10.3)
Chloride: 105 mmol/L (ref 98–111)
Creatinine, Ser: 1.45 mg/dL — ABNORMAL HIGH (ref 0.61–1.24)
GFR, Estimated: 53 mL/min — ABNORMAL LOW (ref >60)
Glucose, Bld: 106 mg/dL — ABNORMAL HIGH (ref 70–99)
Potassium: 4.4 mmol/L (ref 3.5–5.1)
Sodium: 135 mmol/L (ref 135–145)

## 2021-07-26 LAB — APTT: aPTT: 74 seconds — ABNORMAL HIGH (ref 24–36)

## 2021-07-26 LAB — MAGNESIUM: Magnesium: 2.3 mg/dL (ref 1.7–2.4)

## 2021-07-26 LAB — HEPARIN LEVEL (UNFRACTIONATED): Heparin Unfractionated: >1.1 IU/mL — ABNORMAL HIGH (ref 0.30–0.70)

## 2021-07-26 LAB — T3, FREE: T3, Free: 2.3 pg/mL (ref 2.0–4.4)

## 2021-07-26 MED ORDER — MANNITOL 20 % IV SOLN
INTRAVENOUS | Status: AC
  Filled 2021-07-26: qty 13

## 2021-07-26 MED ORDER — POTASSIUM CHLORIDE 2 MEQ/ML IV SOLN
80.0000 meq | INTRAVENOUS | Status: AC
  Filled 2021-07-26: qty 40

## 2021-07-26 MED ORDER — CEFAZOLIN SODIUM-DEXTROSE 2-4 GM/100ML-% IV SOLN
2.0000 g | INTRAVENOUS | Status: AC
Start: 2021-07-27 — End: 2021-07-28
  Filled 2021-07-26: qty 100

## 2021-07-26 MED ORDER — TRANEXAMIC ACID (OHS) PUMP PRIME SOLUTION
2.0000 mg/kg | INTRAVENOUS | Status: AC
  Filled 2021-07-26: qty 1.42

## 2021-07-26 MED ORDER — NOREPINEPHRINE 4 MG/250ML-% IV SOLN
0.0000 ug/min | INTRAVENOUS | Status: AC
  Filled 2021-07-26: qty 250

## 2021-07-26 MED ORDER — HEPARIN 30,000 UNITS/1000 ML (OHS) CELLSAVER SOLUTION
Status: AC
Start: 2021-07-27 — End: 2021-07-28
  Filled 2021-07-26: qty 1000

## 2021-07-26 MED ORDER — EPINEPHRINE HCL 5 MG/250ML IV SOLN IN NS
0.0000 ug/min | INTRAVENOUS | Status: AC
  Filled 2021-07-26: qty 250

## 2021-07-26 MED ORDER — PLASMA-LYTE A IV SOLN
INTRAVENOUS | Status: AC
  Filled 2021-07-26: qty 2.5

## 2021-07-26 MED ORDER — MILRINONE LACTATE IN DEXTROSE 20-5 MG/100ML-% IV SOLN
0.3000 ug/kg/min | INTRAVENOUS | Status: AC
  Filled 2021-07-26: qty 100

## 2021-07-26 MED ORDER — INSULIN REGULAR(HUMAN) IN NACL 100-0.9 UT/100ML-% IV SOLN
INTRAVENOUS | Status: AC
  Filled 2021-07-26: qty 100

## 2021-07-26 MED ORDER — TRANEXAMIC ACID (OHS) BOLUS VIA INFUSION
15.0000 mg/kg | INTRAVENOUS | Status: AC
  Filled 2021-07-26: qty 1068

## 2021-07-26 MED ORDER — PHENYLEPHRINE HCL-NACL 20-0.9 MG/250ML-% IV SOLN
30.0000 ug/min | INTRAVENOUS | Status: AC
  Filled 2021-07-26: qty 250

## 2021-07-26 MED ORDER — TRANEXAMIC ACID 1000 MG/10ML IV SOLN
1.5000 mg/kg/h | INTRAVENOUS | Status: AC
  Filled 2021-07-26: qty 25

## 2021-07-26 MED ORDER — CEFAZOLIN SODIUM-DEXTROSE 2-4 GM/100ML-% IV SOLN
2.0000 g | INTRAVENOUS | Status: AC
  Filled 2021-07-26: qty 100

## 2021-07-26 MED ORDER — VANCOMYCIN HCL 1250 MG/250ML IV SOLN
1250.0000 mg | INTRAVENOUS | Status: AC
  Filled 2021-07-26: qty 250

## 2021-07-26 MED ORDER — NITROGLYCERIN IN D5W 200-5 MCG/ML-% IV SOLN
2.0000 ug/min | INTRAVENOUS | Status: AC
  Filled 2021-07-26: qty 250

## 2021-07-26 MED ORDER — DEXMEDETOMIDINE HCL IN NACL 400 MCG/100ML IV SOLN
0.1000 ug/kg/h | INTRAVENOUS | Status: AC
  Filled 2021-07-26: qty 100

## 2021-07-26 NOTE — Progress Notes (Addendum)
Mobility Specialist Progress Note: ? ? 07/26/21 1040  ?Mobility  ?Activity Ambulated with assistance in hallway  ?Level of Assistance Standby assist, set-up cues, supervision of patient - no hands on  ?Assistive Device None  ?Distance Ambulated (ft) 450 ft  ?Activity Response Tolerated well  ?$Mobility charge 1 Mobility  ? ?Pt agreeable to mobility session. Ambulated at supervision level. HR elevated to 145 bpm upon exertion, pt asx.. Pt back in bed with all needs met.  ? ?Anna Kincaid ?Acute Rehab ?Phone: [phone] ?Office Phone: [phone] ? ?

## 2021-07-26 NOTE — H&P (View-Only) (Signed)
Procedure(s) (LRB): ?CORONARY ARTERY BYPASS GRAFTING (CABG) (N/A) ?AORTIC VALVE REPLACEMENT (AVR) (N/A) ?TRANSESOPHAGEAL ECHOCARDIOGRAM (TEE) (N/A) ?Subjective: ?No chest pain or shortness of breath. ? ?Objective: ?Vital signs in last 24 hours: ?Temp:  [97.8 ?F (36.6 ?C)-99.1 ?F (37.3 ?C)] 99.1 ?F (37.3 ?C) (03/31 1136) ?Pulse Rate:  [57-73] 73 (03/31 0740) ?Cardiac Rhythm: Heart block;Sinus bradycardia (03/31 0700) ?Resp:  [18-22] 18 (03/31 1136) ?BP: (90-110)/(51-64) 96/51 (03/31 1136) ?SpO2:  [91 %-96 %] 95 % (03/31 1136) ?Weight:  [71.2 kg] 71.2 kg (03/31 0429) ? ?Hemodynamic parameters for last 24 hours: ?  ? ?Intake/Output from previous day: ?03/30 0701 - 03/31 0700 ?In: 236 [P.O.:236] ?Out: 1000 [Urine:1000] ?Intake/Output this shift: ?Total I/O ?In: 240 [P.O.:240] ?Out: 300 [Urine:300] ? ?General appearance: alert and cooperative ?Neurologic: intact ?Heart: regular rate and rhythm, S1, S2 normal, 2/6 systolic murmur ?Lungs: clear to auscultation bilaterally ?Extremities: no edema ? ? ?Lab Results: ?Recent Labs  ?  07/25/21 ?7035 07/26/21 ?0351  ?WBC 12.0* 12.5*  ?HGB 10.8* 10.7*  ?HCT 32.2* 32.2*  ?PLT 212 209  ? ?BMET:  ?Recent Labs  ?  07/25/21 ?0093 07/26/21 ?0351  ?NA 136 135  ?K 4.4 4.4  ?CL 108 105  ?CO2 20* 21*  ?GLUCOSE 116* 106*  ?BUN 25* 23  ?CREATININE 1.61* 1.45*  ?CALCIUM 8.8* 8.6*  ?  ?PT/INR: No results for input(s): LABPROT, INR in the last 72 hours. ?ABG ?   ?Component Value Date/Time  ? PHART 7.410 05/21/2021 1617  ? HCO3 25.3 05/21/2021 1622  ? HCO3 23.6 05/21/2021 1622  ? TCO2 27 05/21/2021 1622  ? TCO2 25 05/21/2021 1622  ? ACIDBASEDEF 2.0 05/21/2021 1622  ? O2SAT 71.0 05/21/2021 1622  ? O2SAT 69.0 05/21/2021 1622  ? ?CBG (last 3)  ?Recent Labs  ?  07/25/21 ?2030 07/26/21 ?0422 07/26/21 ?1135  ?GLUCAP 154* 107* 149*  ? ? ?Assessment/Plan: ? ?Severe multivessel CAD and moderate AS/AI. Plan CABG and AVR on Monday am. I discussed surgery again with patient and his son and answered their  questions. ? ? ? ? LOS: 3 days  ? ? ?Alleen Borne ?07/26/2021 ? ? ?

## 2021-07-26 NOTE — Care Management Important Message (Signed)
Important Message ? ?Patient Details  ?Name: Antonio Cervantes ?MRN: [id number] ?Date of Birth: Aug 21, 1955 ? ? ?Medicare Important Message Given:  Yes ? ? ? ? ?Shelda Altes ?07/26/2021, 8:18 AM ?

## 2021-07-26 NOTE — Progress Notes (Addendum)
? ?Progress Note ? ?Patient Name: Antonio Cervantes ?Date of Encounter: 07/26/2021 ? ?[facility] Cardiologist: None Daneen Schick ? ?Subjective  ? ?Ambulating without difficulty.  Conversation with the patient and son who is in the room with him today.  We talked about the planned surgery and valve replacement. ? ?Inpatient Medications  ?  ?Scheduled Meds: ? allopurinol  100 mg Oral Daily  ? aspirin EC  81 mg Oral Daily  ? atorvastatin  80 mg Oral Daily  ? [START ON 07/27/2021] epinephrine  0-10 mcg/min Intravenous To OR  ? ezetimibe  10 mg Oral Daily  ? [START ON 07/27/2021] heparin-papaverine-plasmalyte irrigation   Irrigation To OR  ? insulin aspart  0-5 Units Subcutaneous QHS  ? insulin aspart  0-9 Units Subcutaneous TID WC  ? [START ON 07/27/2021] insulin   Intravenous To OR  ? [START ON 07/27/2021] [facility] Blood Cardioplegia vial (lidocaine/magnesium/mannitol 0.26g-4g-6.4g)   Intracoronary To OR  ? levothyroxine  75 mcg Oral Q0600  ? metoprolol succinate  12.5 mg Oral Daily  ? [START ON 07/27/2021] phenylephrine  30-200 mcg/min Intravenous To OR  ? [START ON 07/27/2021] potassium chloride  80 mEq Other To OR  ? [START ON 07/27/2021] tranexamic acid  15 mg/kg Intravenous To OR  ? [START ON 07/27/2021] tranexamic acid  2 mg/kg Intracatheter To OR  ? ?Continuous Infusions: ? [START ON 07/27/2021]  ceFAZolin (ANCEF) IV    ? [START ON 07/27/2021]  ceFAZolin (ANCEF) IV    ? [START ON 07/27/2021] dexmedetomidine    ? [START ON 07/27/2021] heparin 30,000 units/NS 1000 mL solution for CELLSAVER    ? heparin 1,050 Units/hr (07/25/21 1730)  ? [START ON 07/27/2021] milrinone    ? [START ON 07/27/2021] nitroGLYCERIN    ? [START ON 07/27/2021] norepinephrine    ? [START ON 07/27/2021] tranexamic acid (CYKLOKAPRON) infusion ([facility])    ? [START ON 07/27/2021] vancomycin    ? ?PRN Meds: ?acetaminophen, nitroGLYCERIN, ondansetron (ZOFRAN) IV, sodium chloride  ? ?Vital Signs  ?  ?Vitals:  ? 07/26/21 0421 07/26/21 0429 07/26/21 0740 07/26/21 1136  ?BP: (!)  90/52  96/64 (!) 96/51  ?Pulse: (!) 57  73   ?Resp:   (!) 22 18  ?Temp: 97.8 ?F (36.6 ?C)  98 ?F (36.7 ?C) 99.1 ?F (37.3 ?C)  ?TempSrc: Oral  Oral Oral  ?SpO2: 95%  91% 95%  ?Weight:  71.2 kg    ?Height:      ? ? ?Intake/Output Summary (Last 24 hours) at 07/26/2021 1250 ?Last data filed at 07/26/2021 1100 ?Gross per 24 hour  ?Intake 236 ml  ?Output 850 ml  ?Net -614 ml  ? ? ?  07/26/2021  ?  4:29 AM 07/25/2021  ?  5:09 AM 07/24/2021  ?  4:10 AM  ?Last 3 Weights  ?Weight (lbs) 156 lb 15.5 oz 157 lb 3 oz 156 lb  ?Weight (kg) 71.2 kg 71.3 kg 70.761 kg  ?   ? ?Telemetry  ?  ?Normal sinus rhythm- Personally Reviewed ? ?ECG  ?  ?New study not repeated- Personally Reviewed ? ?Physical Exam  ?Bearded ?GEN: No acute distress.   ?Neck: No JVD ?Cardiac: RRR, no murmurs, rubs, or gallops.  ?Respiratory: Clear to auscultation bilaterally. ?GI: Soft, nontender, non-distended  ?MS: No edema; No deformity. ?Neuro:  Nonfocal  ?Psych: Normal affect  ? ?Labs  ?  ?High Sensitivity Troponin:   ?Recent Labs  ?Lab 07/22/21 ?2224 07/23/21 ?0010  ?TROPONINIHS 473* 1,610*  ?   ?Chemistry ?  Recent Labs  ?Lab 07/22/21 ?2224 07/23/21 ?0453 07/24/21 ?[id number] 07/25/21 ?[id number] 07/26/21 ?0351  ?NA 137   < > 138 136 135  ?K 3.9   < > 3.8 4.4 4.4  ?CL 108   < > 111 108 105  ?CO2 19*   < > 18* 20* 21*  ?GLUCOSE 280*   < > 116* 116* 106*  ?BUN 35*   < > 31* 25* 23  ?CREATININE 1.61*   < > 1.41* 1.61* 1.45*  ?CALCIUM 9.3   < > 8.8* 8.8* 8.6*  ?MG  --   --  1.6* 2.3 2.3  ?PROT 7.0  --   --   --   --   ?ALBUMIN 3.7  --   --   --   --   ?AST 25  --   --   --   --   ?ALT 20  --   --   --   --   ?ALKPHOS 57  --   --   --   --   ?BILITOT 0.7  --   --   --   --   ?GFRNONAA 47*   < > 55* 47* 53*  ?ANIONGAP 10   < > 9 8 9   ? < > = values in this interval not displayed.  ?  ?Lipids  ?Recent Labs  ?Lab 07/23/21 ?[id number]  ?CHOL 73  ?TRIG 108  ?HDL 19*  ?[place] 32  ?CHOLHDL 3.8  ?  ?Hematology ?Recent Labs  ?Lab 07/24/21 ?[id number] 07/25/21 ?[id number] 07/26/21 ?0351  ?WBC 10.6* 12.0*  12.5*  ?RBC 3.60* 3.58* 3.56*  ?HGB 10.8* 10.8* 10.7*  ?HCT 32.6* 32.2* 32.2*  ?MCV 90.6 89.9 90.4  ?MCH 30.0 30.2 30.1  ?MCHC 33.1 33.5 33.2  ?RDW 14.4 14.6 14.5  ?PLT 184 212 209  ? ?Thyroid  ?Recent Labs  ?Lab 07/25/21 ?[id number]  ?TSH 12.447*  ?FREET4 0.85  ?  ?BNP ?Recent Labs  ?Lab 07/22/21 ?2244  ?BNP 237.3*  ?  ?DDimer No results for input(s): DDIMER in the last 168 hours.  ? ?Radiology  ?  ?DG Orthopantogram ? ?Result Date: 07/25/2021 ?CLINICAL DATA:  Cardiac surgery EXAM: ORTHOPANTOGRAM/PANORAMIC COMPARISON:  None FINDINGS: Multiple prior dental extractions. Multiple dental caries and dental fillings. No significant periodontal lucencies or abscess identified. Osseous structures otherwise unremarkable. IMPRESSION: Multiple prior dental extractions, dental caries and scattered fillings. Electronically Signed   By: Lavonia Dana M.D.   On: 07/25/2021 10:54  ? ?ECHOCARDIOGRAM COMPLETE ? ?Result Date: 07/24/2021 ?   ECHOCARDIOGRAM REPORT   Patient Name:   Antonio Cervantes Date of Exam: 07/24/2021 Medical Rec #:  [id number]         Height:       63.0 in Accession #:    [id number]        Weight:       156.0 lb Date of Birth:  May 07, 1955        BSA:          1.740 m? Patient Age:    66 years          BP:           110/55 mmHg Patient Gender: M                 HR:           52 bpm. Exam Location:  Inpatient Procedure: 2D Echo, Cardiac Doppler and Color Doppler Indications:    Acute ischemic heart disease, unspecified I24.9  History:        Patient has prior history of Echocardiogram examinations, most                 recent 05/20/2021. Cardiomyopathy, Previous Myocardial Infarction                 and CAD, Arrythmias:Atrial Fibrillation; Risk Factors:Diabetes                 and Dyslipidemia. HFrEF (heart failure with reduced ejection                 fraction) ([place]).  Sonographer:    Alvino Chapel RCS Referring Phys: [id number] [place]  1. Left ventricular ejection fraction, by estimation, is 50%. The left  ventricle has low normal function. The left ventricle has no regional wall motion abnormalities. There is mild left ventricular hypertrophy. Left ventricular diastolic parameters are  consistent with Grade II diastolic dysfunction (pseudonormalization).  2. Right ventricular systolic function is normal. The right ventricular size is normal. Tricuspid regurgitation signal is inadequate for assessing PA pressure.  3. Left atrial size was mildly dilated.  4. The mitral valve is normal in structure. Trivial mitral valve regurgitation. No evidence of mitral stenosis.  5. The aortic valve is abnormal. There is moderate calcification of the aortic valve. Aortic valve regurgitation is mild. Mild aortic valve stenosis. Aortic valve mean gradient measures 11.0 mmHg.  6. The inferior vena cava is normal in size with greater than 50% respiratory variability, suggesting right atrial pressure of 3 mmHg. FINDINGS  Left Ventricle: Left ventricular ejection fraction, by estimation, is 50%. The left ventricle has low normal function. The left ventricle has no regional wall motion abnormalities. The left ventricular internal cavity size was normal in size. There is mild left ventricular hypertrophy. Left ventricular diastolic parameters are consistent with Grade II diastolic dysfunction (pseudonormalization). Right Ventricle: The right ventricular size is normal. No increase in right ventricular wall thickness. Right ventricular systolic function is normal. Tricuspid regurgitation signal is inadequate for assessing PA pressure. Left Atrium: Left atrial size was mildly dilated. Right Atrium: Right atrial size was normal in size. Pericardium: There is no evidence of pericardial effusion. Mitral Valve: The mitral valve is normal in structure. Trivial mitral valve regurgitation. No evidence of mitral valve stenosis. Tricuspid Valve: The tricuspid valve is normal in structure. Tricuspid valve regurgitation is trivial. No evidence of  tricuspid stenosis. Aortic Valve: The aortic valve is abnormal. There is moderate calcification of the aortic valve. Aortic valve regurgitation is mild. Aortic regurgitation PHT measures 604 msec. Mild aortic ste

## 2021-07-26 NOTE — Progress Notes (Signed)
Procedure(s) (LRB): ?CORONARY ARTERY BYPASS GRAFTING (CABG) (N/A) ?AORTIC VALVE REPLACEMENT (AVR) (N/A) ?TRANSESOPHAGEAL ECHOCARDIOGRAM (TEE) (N/A) ?Subjective: ?No chest pain or shortness of breath. ? ?Objective: ?Vital signs in last 24 hours: ?Temp:  [97.8 ?F (36.6 ?C)-99.1 ?F (37.3 ?C)] 99.1 ?F (37.3 ?C) (03/31 1136) ?Pulse Rate:  [57-73] 73 (03/31 0740) ?Cardiac Rhythm: Heart block;Sinus bradycardia (03/31 0700) ?Resp:  [18-22] 18 (03/31 1136) ?BP: (90-110)/(51-64) 96/51 (03/31 1136) ?SpO2:  [91 %-96 %] 95 % (03/31 1136) ?Weight:  [71.2 kg] 71.2 kg (03/31 0429) ? ?Hemodynamic parameters for last 24 hours: ?  ? ?Intake/Output from previous day: ?03/30 0701 - 03/31 0700 ?In: 236 [P.O.:236] ?Out: 1000 [Urine:1000] ?Intake/Output this shift: ?Total I/O ?In: 240 [P.O.:240] ?Out: 300 [Urine:300] ? ?General appearance: alert and cooperative ?Neurologic: intact ?Heart: regular rate and rhythm, S1, S2 normal, 2/6 systolic murmur ?Lungs: clear to auscultation bilaterally ?Extremities: no edema ? ? ?Lab Results: ?Recent Labs  ?  07/25/21 ?0437 07/26/21 ?0351  ?WBC 12.0* 12.5*  ?HGB 10.8* 10.7*  ?HCT 32.2* 32.2*  ?PLT 212 209  ? ?BMET:  ?Recent Labs  ?  07/25/21 ?0437 07/26/21 ?0351  ?NA 136 135  ?K 4.4 4.4  ?CL 108 105  ?CO2 20* 21*  ?GLUCOSE 116* 106*  ?BUN 25* 23  ?CREATININE 1.61* 1.45*  ?CALCIUM 8.8* 8.6*  ?  ?PT/INR: No results for input(s): LABPROT, INR in the last 72 hours. ?ABG ?   ?Component Value Date/Time  ? PHART 7.410 05/21/2021 1617  ? HCO3 25.3 05/21/2021 1622  ? HCO3 23.6 05/21/2021 1622  ? TCO2 27 05/21/2021 1622  ? TCO2 25 05/21/2021 1622  ? ACIDBASEDEF 2.0 05/21/2021 1622  ? O2SAT 71.0 05/21/2021 1622  ? O2SAT 69.0 05/21/2021 1622  ? ?CBG (last 3)  ?Recent Labs  ?  07/25/21 ?2030 07/26/21 ?0422 07/26/21 ?1135  ?GLUCAP 154* 107* 149*  ? ? ?Assessment/Plan: ? ?Severe multivessel CAD and moderate AS/AI. Plan CABG and AVR on Monday am. I discussed surgery again with patient and his son and answered their  questions. ? ? ? ? LOS: 3 days  ? ? ?Antonio Cervantes [name] ?07/26/2021 ? ? ?

## 2021-07-26 NOTE — Progress Notes (Signed)
Pt resting now. Has IS, able to perform 1500 ml currently. Encouraged use and walking. No further questions.  ?[id number] ?Ethelda Chick CES, ACSM ?3:02 PM ?07/26/2021 ? ?

## 2021-07-27 LAB — BASIC METABOLIC PANEL
Anion gap: 9 (ref 5–15)
BUN: 23 mg/dL (ref 8–23)
CO2: 21 mmol/L — ABNORMAL LOW (ref 22–32)
Calcium: 8.9 mg/dL (ref 8.9–10.3)
Chloride: 107 mmol/L (ref 98–111)
Creatinine, Ser: 1.33 mg/dL — ABNORMAL HIGH (ref 0.61–1.24)
GFR, Estimated: 59 mL/min — ABNORMAL LOW (ref >60)
Glucose, Bld: 115 mg/dL — ABNORMAL HIGH (ref 70–99)
Potassium: 4.3 mmol/L (ref 3.5–5.1)
Sodium: 137 mmol/L (ref 135–145)

## 2021-07-27 LAB — CBC
HCT: 32.9 % — ABNORMAL LOW (ref 39.0–52.0)
Hemoglobin: 11.1 g/dL — ABNORMAL LOW (ref 13.0–17.0)
MCH: 30.3 pg (ref 26.0–34.0)
MCHC: 33.7 g/dL (ref 30.0–36.0)
MCV: 89.9 fL (ref 80.0–100.0)
Platelets: 176 10*3/uL (ref 150–400)
RBC: 3.66 MIL/uL — ABNORMAL LOW (ref 4.22–5.81)
RDW: 14.6 % (ref 11.5–15.5)
WBC: 10.2 10*3/uL (ref 4.0–10.5)
nRBC: 0 % (ref 0.0–0.2)

## 2021-07-27 LAB — GLUCOSE, CAPILLARY
Glucose-Capillary: 113 mg/dL — ABNORMAL HIGH (ref 70–99)
Glucose-Capillary: 122 mg/dL — ABNORMAL HIGH (ref 70–99)
Glucose-Capillary: 147 mg/dL — ABNORMAL HIGH (ref 70–99)
Glucose-Capillary: 160 mg/dL — ABNORMAL HIGH (ref 70–99)

## 2021-07-27 LAB — HEPARIN LEVEL (UNFRACTIONATED): Heparin Unfractionated: >1.1 IU/mL — ABNORMAL HIGH (ref 0.30–0.70)

## 2021-07-27 LAB — MAGNESIUM: Magnesium: 2.2 mg/dL (ref 1.7–2.4)

## 2021-07-27 LAB — APTT: aPTT: 74 seconds — ABNORMAL HIGH (ref 24–36)

## 2021-07-27 MED ORDER — CEFAZOLIN SODIUM-DEXTROSE 2-4 GM/100ML-% IV SOLN
2.0000 g | INTRAVENOUS | Status: AC
  Administered 2021-07-29: 2 g via INTRAVENOUS
  Filled 2021-07-27: qty 100

## 2021-07-27 MED ORDER — MANNITOL 20 % IV SOLN
INTRAVENOUS | Status: DC
  Filled 2021-07-27: qty 13

## 2021-07-27 MED ORDER — TRANEXAMIC ACID (OHS) PUMP PRIME SOLUTION
2.0000 mg/kg | INTRAVENOUS | Status: DC
  Filled 2021-07-27: qty 1.42

## 2021-07-27 MED ORDER — PHENYLEPHRINE HCL-NACL 20-0.9 MG/250ML-% IV SOLN
30.0000 ug/min | INTRAVENOUS | Status: AC
  Administered 2021-07-29: 25 ug/min via INTRAVENOUS
  Filled 2021-07-27: qty 250

## 2021-07-27 MED ORDER — EPINEPHRINE HCL 5 MG/250ML IV SOLN IN NS
0.0000 ug/min | INTRAVENOUS | Status: DC
  Filled 2021-07-27: qty 250

## 2021-07-27 MED ORDER — NOREPINEPHRINE 4 MG/250ML-% IV SOLN
0.0000 ug/min | INTRAVENOUS | Status: DC
  Filled 2021-07-27: qty 250

## 2021-07-27 MED ORDER — POTASSIUM CHLORIDE 2 MEQ/ML IV SOLN
80.0000 meq | INTRAVENOUS | Status: DC
  Filled 2021-07-27: qty 40

## 2021-07-27 MED ORDER — DEXMEDETOMIDINE HCL IN NACL 400 MCG/100ML IV SOLN
0.1000 ug/kg/h | INTRAVENOUS | Status: AC
  Administered 2021-07-29: .2 ug/kg/h via INTRAVENOUS
  Filled 2021-07-27: qty 100

## 2021-07-27 MED ORDER — TRANEXAMIC ACID 1000 MG/10ML IV SOLN
1.5000 mg/kg/h | INTRAVENOUS | Status: AC
  Administered 2021-07-29: 1.5 mg/kg/h via INTRAVENOUS
  Filled 2021-07-27: qty 25

## 2021-07-27 MED ORDER — NITROGLYCERIN IN D5W 200-5 MCG/ML-% IV SOLN: 2.0000 ug/min | INTRAVENOUS | Status: DC

## 2021-07-27 MED ORDER — TRANEXAMIC ACID (OHS) BOLUS VIA INFUSION
15.0000 mg/kg | INTRAVENOUS | Status: AC
  Administered 2021-07-29: 1068 mg via INTRAVENOUS
  Filled 2021-07-27: qty 1068

## 2021-07-27 MED ORDER — HEPARIN 30,000 UNITS/1000 ML (OHS) CELLSAVER SOLUTION
Status: DC
  Filled 2021-07-27: qty 1000

## 2021-07-27 MED ORDER — VANCOMYCIN HCL 1250 MG/250ML IV SOLN
1250.0000 mg | INTRAVENOUS | Status: AC
  Administered 2021-07-29: 1250 mg via INTRAVENOUS
  Filled 2021-07-27: qty 250

## 2021-07-27 MED ORDER — PLASMA-LYTE A IV SOLN
INTRAVENOUS | Status: DC
  Filled 2021-07-27: qty 2.5

## 2021-07-27 MED ORDER — MILRINONE LACTATE IN DEXTROSE 20-5 MG/100ML-% IV SOLN: 0.3000 ug/kg/min | INTRAVENOUS | Status: DC

## 2021-07-27 MED ORDER — INSULIN REGULAR(HUMAN) IN NACL 100-0.9 UT/100ML-% IV SOLN
INTRAVENOUS | Status: AC
  Administered 2021-07-29: 3.2 [IU]/h via INTRAVENOUS
  Filled 2021-07-27: qty 100

## 2021-07-27 NOTE — Progress Notes (Signed)
CARDIAC REHAB PHASE I  ? ?PRE:  Rate/Rhythm: 57 SB ? ?BP:  Sitting: 112/64  ?  ?  SaO2: 100 RA ? ?MODE:  Ambulation: 470 ft  ? ?POST:  Rate/Rhythm: 73 SR ? ?BP:  Sitting: 120/67   ? ?  SaO2: 100 RA ? ?Pt tolerated exercise well and amb 470 ft independently. Pt denies CP, SOB, or dizziness throughout walk. Discussed staying in the tube and IS use w/ pt. Pt verbalized understanding of what I said. Pt stated that son and daughter-in-law will take care of him after discharge. Will continue to follow. ? ?[id number] ?Sheppard Plumber, MS, ACSM-CEP ?07/27/2021 ?10:11 AM ? ?  ? ?

## 2021-07-27 NOTE — Progress Notes (Addendum)
ANTICOAGULATION CONSULT NOTE ? ?Pharmacy Consult for heparin ?Indication: chest pain/ACS ? ?No Known Allergies ? ?Patient Measurements: ?Height: 5\' 3"  (160 cm) ?Weight: 69.8 kg (153 lb 14.4 oz) ?IBW/kg (Calculated) : 56.9 ?Heparin Dosing Weight: 71 kg ? ?Vital Signs: ?Temp: 98.9 ?F (37.2 ?C) (04/01 0347) ?Temp Source: Oral (04/01 0347) ?BP: 95/52 (04/01 0347) ?Pulse Rate: 54 (04/01 0347) ? ?Labs: ?Recent Labs  ?  07/25/21 ?[id number] 07/25/21 ?1359 07/25/21 ?2219 07/26/21 ?0351 07/27/21 ?0355  ?HGB 10.8*  --   --  10.7* 11.1*  ?HCT 32.2*  --   --  32.2* 32.9*  ?PLT 212  --   --  209 176  ?APTT 58*   < > 76* 74* 74*  ?HEPARINUNFRC >1.10*  --  >1.10* >1.10* >1.10*  ?CREATININE 1.61*  --   --  1.45* 1.33*  ? < > = values in this interval not displayed.  ? ? ?Estimated Creatinine Clearance: 48 mL/min (A) (by C-G formula based on SCr of 1.33 mg/dL (H)). ? ?Medical History: ?Past Medical History:  ?Diagnosis Date  ? Diabetes mellitus without complication ([place])   ? Diabetic peripheral neuropathy associated with type 2 diabetes mellitus ([place])   ? ? ?Medications:  ?Infusions:  ?  ceFAZolin (ANCEF) IV    ?  ceFAZolin (ANCEF) IV    ? dexmedetomidine    ? heparin 30,000 units/NS 1000 mL solution for CELLSAVER    ? heparin 1,050 Units/hr (07/26/21 1734)  ? milrinone    ? nitroGLYCERIN    ? norepinephrine    ? tranexamic acid (CYKLOKAPRON) infusion ([facility])    ? vancomycin    ? ? ?Assessment: ?66 yo male presents with CP, elevated troponin. PTA apixaban (last dose (3/27 @ 2000) with plans for CABG + AVR on 4/3. Pharmacy consulted for heparin dosing. ? ?aPTT 74 seconds therapeutic on 1050 units/hr. CBC stable - Hgb 11.1, Plt 176. Will continue to monitor via aPTT until Eliquis no longer impacting anti-Xa activity. No infusion problems or bleeding noted per RN. ? ?Goal of Therapy:  ?Heparin level 0.3-0.7 units/ml ?aPTT 66-102 seconds ?Monitor platelets by anticoagulation protocol: Yes ?  ?Plan:  ?Continue heparin infusion @1050   units/hr ?Daily CBC, heparin level ?Monitor for s/sx of bleeding ? ?Laurey Arrow, PharmD ?PGY1 Pharmacy Resident ?07/27/2021  7:26 AM ? ?Please check [email or web address] for unit-specific pharmacy phone numbers. ? ? ?

## 2021-07-28 DIAGNOSIS — I214 Non-ST elevation (NSTEMI) myocardial infarction: Secondary | ICD-10-CM | POA: Diagnosis not present

## 2021-07-28 LAB — CBC
HCT: 33.8 % — ABNORMAL LOW (ref 39.0–52.0)
Hemoglobin: 11.3 g/dL — ABNORMAL LOW (ref 13.0–17.0)
MCH: 30.2 pg (ref 26.0–34.0)
MCHC: 33.4 g/dL (ref 30.0–36.0)
MCV: 90.4 fL (ref 80.0–100.0)
Platelets: 212 10*3/uL (ref 150–400)
RBC: 3.74 MIL/uL — ABNORMAL LOW (ref 4.22–5.81)
RDW: 14.4 % (ref 11.5–15.5)
WBC: 10 10*3/uL (ref 4.0–10.5)
nRBC: 0 % (ref 0.0–0.2)

## 2021-07-28 LAB — BLOOD GAS, ARTERIAL
Acid-base deficit: 1.4 mmol/L (ref 0.0–2.0)
Bicarbonate: 22.8 mmol/L (ref 20.0–28.0)
O2 Saturation: 98.4 %
Patient temperature: 37.1
pCO2 arterial: 36 mmHg (ref 32–48)
pH, Arterial: 7.41 (ref 7.35–7.45)
pO2, Arterial: 83 mmHg (ref 83–108)

## 2021-07-28 LAB — BASIC METABOLIC PANEL
Anion gap: 13 (ref 5–15)
BUN: 22 mg/dL (ref 8–23)
CO2: 22 mmol/L (ref 22–32)
Calcium: 9.3 mg/dL (ref 8.9–10.3)
Chloride: 105 mmol/L (ref 98–111)
Creatinine, Ser: 1.4 mg/dL — ABNORMAL HIGH (ref 0.61–1.24)
GFR, Estimated: 55 mL/min — ABNORMAL LOW (ref >60)
Glucose, Bld: 137 mg/dL — ABNORMAL HIGH (ref 70–99)
Potassium: 4.7 mmol/L (ref 3.5–5.1)
Sodium: 140 mmol/L (ref 135–145)

## 2021-07-28 LAB — GLUCOSE, CAPILLARY
Glucose-Capillary: 140 mg/dL — ABNORMAL HIGH (ref 70–99)
Glucose-Capillary: 146 mg/dL — ABNORMAL HIGH (ref 70–99)
Glucose-Capillary: 133 mg/dL — ABNORMAL HIGH (ref 70–99)
Glucose-Capillary: 164 mg/dL — ABNORMAL HIGH (ref 70–99)

## 2021-07-28 LAB — URINALYSIS, ROUTINE W REFLEX MICROSCOPIC
Bacteria, UA: NONE SEEN
Bilirubin Urine: NEGATIVE
Glucose, UA: >=500 mg/dL — AB
Hgb urine dipstick: NEGATIVE
Ketones, ur: NEGATIVE mg/dL
Leukocytes,Ua: NEGATIVE
Nitrite: NEGATIVE
Protein, ur: NEGATIVE mg/dL
Specific Gravity, Urine: 1.01 (ref 1.005–1.030)
pH: 5 (ref 5.0–8.0)

## 2021-07-28 LAB — SURGICAL PCR SCREEN
MRSA, PCR: NEGATIVE
Staphylococcus aureus: POSITIVE — AB

## 2021-07-28 LAB — APTT: aPTT: 74 seconds — ABNORMAL HIGH (ref 24–36)

## 2021-07-28 LAB — ABO/RH: ABO/RH(D): A POS

## 2021-07-28 LAB — MAGNESIUM: Magnesium: 1.9 mg/dL (ref 1.7–2.4)

## 2021-07-28 LAB — HEPARIN LEVEL (UNFRACTIONATED): Heparin Unfractionated: >1.1 IU/mL — ABNORMAL HIGH (ref 0.30–0.70)

## 2021-07-28 MED ORDER — METOPROLOL TARTRATE 12.5 MG HALF TABLET
12.5000 mg | ORAL_TABLET | Freq: Once | ORAL | Status: AC
  Administered 2021-07-29: 12.5 mg via ORAL
  Filled 2021-07-28: qty 1

## 2021-07-28 MED ORDER — MUPIROCIN 2 % EX OINT
1.0000 "application " | TOPICAL_OINTMENT | Freq: Two times a day (BID) | CUTANEOUS | Status: DC
  Administered 2021-07-28: 1 via NASAL
  Filled 2021-07-28: qty 22

## 2021-07-28 MED ORDER — CHLORHEXIDINE GLUCONATE CLOTH 2 % EX PADS
6.0000 | MEDICATED_PAD | Freq: Once | CUTANEOUS | Status: AC
  Administered 2021-07-28: 6 via TOPICAL

## 2021-07-28 MED ORDER — BISACODYL 5 MG PO TBEC: 5.0000 mg | DELAYED_RELEASE_TABLET | Freq: Once | ORAL | Status: DC

## 2021-07-28 MED ORDER — CHLORHEXIDINE GLUCONATE 0.12 % MT SOLN
15.0000 mL | Freq: Once | OROMUCOSAL | Status: AC
  Administered 2021-07-29: 15 mL via OROMUCOSAL
  Filled 2021-07-28: qty 15

## 2021-07-28 MED ORDER — DIAZEPAM 5 MG PO TABS
5.0000 mg | ORAL_TABLET | Freq: Once | ORAL | Status: DC
  Filled 2021-07-28: qty 1

## 2021-07-28 MED ORDER — TEMAZEPAM 15 MG PO CAPS: 15.0000 mg | ORAL_CAPSULE | Freq: Once | ORAL | Status: DC | PRN

## 2021-07-28 MED ORDER — CHLORHEXIDINE GLUCONATE CLOTH 2 % EX PADS
6.0000 | MEDICATED_PAD | Freq: Once | CUTANEOUS | Status: AC
  Administered 2021-07-29: 6 via TOPICAL

## 2021-07-28 NOTE — Progress Notes (Signed)
ANTICOAGULATION CONSULT NOTE ? ?Pharmacy Consult for heparin ?Indication: chest pain/ACS ? ?No Known Allergies ? ?Patient Measurements: ?Height: 5\' 3"  (160 cm) ?Weight: 61.5 kg (135 lb 9.6 oz) ?IBW/kg (Calculated) : 56.9 ?Heparin Dosing Weight: 71 kg ? ?Vital Signs: ?Temp: 98.4 ?F (36.9 ?C) (04/02 0424) ?Temp Source: Oral (04/02 0424) ?BP: 97/55 (04/02 0424) ?Pulse Rate: 53 (04/02 0424) ? ?Labs: ?Recent Labs  ?  07/26/21 ?0351 07/27/21 ?0355 07/28/21 ?0501  ?HGB 10.7* 11.1* 11.3*  ?HCT 32.2* 32.9* 33.8*  ?PLT 209 176 212  ?APTT 74* 74* 74*  ?HEPARINUNFRC >1.10* >1.10* >1.10*  ?CREATININE 1.45* 1.33* 1.40*  ? ? ? ?Estimated Creatinine Clearance: 41.8 mL/min (A) (by C-G formula based on SCr of 1.4 mg/dL (H)). ? ?Medical History: ?Past Medical History:  ?Diagnosis Date  ? Diabetes mellitus without complication ([place])   ? Diabetic peripheral neuropathy associated with type 2 diabetes mellitus ([place])   ? ? ?Medications:  ?Infusions:  ? [START ON 07/29/2021]  ceFAZolin (ANCEF) IV    ? [START ON 07/29/2021]  ceFAZolin (ANCEF) IV    ? [START ON 07/29/2021] dexmedetomidine    ? [START ON 07/29/2021] heparin 30,000 units/NS 1000 mL solution for CELLSAVER    ? heparin 1,050 Units/hr (07/27/21 1717)  ? [START ON 07/29/2021] milrinone    ? [START ON 07/29/2021] nitroGLYCERIN    ? [START ON 07/29/2021] norepinephrine    ? [START ON 07/29/2021] tranexamic acid (CYKLOKAPRON) infusion ([facility])    ? [START ON 07/29/2021] vancomycin    ? ? ?Assessment: ?66 yo male presents with CP, elevated troponin. PTA apixaban (last dose (3/27 @ 2000) with plans for CABG + AVR on 4/3. Pharmacy consulted for heparin dosing. ? ?aPTT 74 seconds still therapeutic on 1050 units/hr. CBC stable - Hgb 11.3, Plt 212. Will continue to monitor via aPTT until Eliquis no longer impacting anti-Xa activity. No infusion problems or bleeding noted per RN. ? ?Goal of Therapy:  ?Heparin level 0.3-0.7 units/ml ?aPTT 66-102 seconds ?Monitor platelets by anticoagulation protocol: Yes ?   ?Plan:  ?Continue heparin infusion @1050  units/hr ?Daily CBC, heparin level ?Monitor for s/sx of bleeding ? ?Laurey Arrow, PharmD ?PGY1 Pharmacy Resident ?07/28/2021  7:15 AM ? ?Please check [email or web address] for unit-specific pharmacy phone numbers. ? ? ?

## 2021-07-28 NOTE — Progress Notes (Signed)
Called cardiothoracic surgery oncall about surgery orders for patient's CABG in the AM. MD stated that they will probably put in later tonight.  ?

## 2021-07-28 NOTE — Progress Notes (Signed)
? ?Progress Note ? ?Patient Name: Antonio Cervantes ?Date of Encounter: 07/28/2021 ? ?[facility] Cardiologist: None Verdis Prime ? ?Subjective  ? ?No CP  NO SOB ? ?Inpatient Medications  ?  ?Scheduled Meds: ? allopurinol  100 mg Oral Daily  ? aspirin EC  81 mg Oral Daily  ? atorvastatin  80 mg Oral Daily  ? [START ON 07/29/2021] epinephrine  0-10 mcg/min Intravenous To OR  ? ezetimibe  10 mg Oral Daily  ? [START ON 07/29/2021] heparin-papaverine-plasmalyte irrigation   Irrigation To OR  ? insulin aspart  0-5 Units Subcutaneous QHS  ? insulin aspart  0-9 Units Subcutaneous TID WC  ? [START ON 07/29/2021] insulin   Intravenous To OR  ? [START ON 07/29/2021] [facility] Blood Cardioplegia vial (lidocaine/magnesium/mannitol 0.26g-4g-6.4g)   Intracoronary To OR  ? levothyroxine  75 mcg Oral Q0600  ? metoprolol succinate  12.5 mg Oral Daily  ? [START ON 07/29/2021] phenylephrine  30-200 mcg/min Intravenous To OR  ? [START ON 07/29/2021] potassium chloride  80 mEq Other To OR  ? [START ON 07/29/2021] tranexamic acid  15 mg/kg Intravenous To OR  ? [START ON 07/29/2021] tranexamic acid  2 mg/kg Intracatheter To OR  ? ?Continuous Infusions: ? [START ON 07/29/2021]  ceFAZolin (ANCEF) IV    ? [START ON 07/29/2021]  ceFAZolin (ANCEF) IV    ? [START ON 07/29/2021] dexmedetomidine    ? [START ON 07/29/2021] heparin 30,000 units/NS 1000 mL solution for CELLSAVER    ? heparin 1,050 Units/hr (07/28/21 1714)  ? [START ON 07/29/2021] milrinone    ? [START ON 07/29/2021] nitroGLYCERIN    ? [START ON 07/29/2021] norepinephrine    ? [START ON 07/29/2021] tranexamic acid (CYKLOKAPRON) infusion ([facility])    ? [START ON 07/29/2021] vancomycin    ? ?PRN Meds: ?acetaminophen, nitroGLYCERIN, ondansetron (ZOFRAN) IV, sodium chloride  ? ?Vital Signs  ?  ?Vitals:  ? 07/28/21 0425 07/28/21 0730 07/28/21 1116 07/28/21 1600  ?BP:  (!) 111/49 132/72 (!) 101/56  ?Pulse:  (!) 57 65 (!) 53  ?Resp: 18 18 19 19   ?Temp:  98.8 ?F (37.1 ?C)  99.4 ?F (37.4 ?C)  ?TempSrc:  Oral  Oral  ?SpO2:  97%  100% 95%  ?Weight:      ?Height:      ? ? ?Intake/Output Summary (Last 24 hours) at 07/28/2021 1827 ?Last data filed at 07/28/2021 1525 ?Gross per 24 hour  ?Intake 1181.87 ml  ?Output 1610 ml  ?Net -428.13 ml  ? ? ?  07/28/2021  ?  4:24 AM 07/27/2021  ?  3:47 AM 07/26/2021  ?  4:29 AM  ?Last 3 Weights  ?Weight (lbs) 135 lb 9.6 oz 153 lb 14.4 oz 156 lb 15.5 oz  ?Weight (kg) 61.508 kg 69.809 kg 71.2 kg  ?   ? ?Telemetry  ?  ?SR- Personally Reviewed ? ?ECG  ?  ?New study not repeated- Personally Reviewed ? ?Physical Exam  ?Bearded ?GEN: No acute distress.   ?Neck: No JVD ?Cardiac: RRR, no murmurs, ?Respiratory: Clear to auscultation bilaterally. ?GI: Soft, nontender, non-distended  ?MS: No edema; No deformity. ?Neuro:  Nonfocal  ?Psych: Normal affect  ? ?Labs  ?  ?High Sensitivity Troponin:   ?Recent Labs  ?Lab 07/22/21 ?2224 07/23/21 ?0010  ?TROPONINIHS 473* 1,610*  ?   ?Chemistry ?Recent Labs  ?Lab 07/22/21 ?2224 07/23/21 ?0453 07/26/21 ?0351 07/27/21 ?0355 07/28/21 ?0501  ?NA 137   < > 135 137 140  ?K 3.9   < >  4.4 4.3 4.7  ?CL 108   < > 105 107 105  ?CO2 19*   < > 21* 21* 22  ?GLUCOSE 280*   < > 106* 115* 137*  ?BUN 35*   < > 23 23 22   ?CREATININE 1.61*   < > 1.45* 1.33* 1.40*  ?CALCIUM 9.3   < > 8.6* 8.9 9.3  ?MG  --    < > 2.3 2.2 1.9  ?PROT 7.0  --   --   --   --   ?ALBUMIN 3.7  --   --   --   --   ?AST 25  --   --   --   --   ?ALT 20  --   --   --   --   ?ALKPHOS 57  --   --   --   --   ?BILITOT 0.7  --   --   --   --   ?GFRNONAA 47*   < > 53* 59* 55*  ?ANIONGAP 10   < > 9 9 13   ? < > = values in this interval not displayed.  ?  ?Lipids  ?Recent Labs  ?Lab 07/23/21 ?0453  ?CHOL 73  ?TRIG 108  ?HDL 19*  ?LDLCALC 32  ?CHOLHDL 3.8  ?  ?Hematology ?Recent Labs  ?Lab 07/26/21 ?0351 07/27/21 ?0355 07/28/21 ?0501  ?WBC 12.5* 10.2 10.0  ?RBC 3.56* 3.66* 3.74*  ?HGB 10.7* 11.1* 11.3*  ?HCT 32.2* 32.9* 33.8*  ?MCV 90.4 89.9 90.4  ?MCH 30.1 30.3 30.2  ?MCHC 33.2 33.7 33.4  ?RDW 14.5 14.6 14.4  ?PLT 209 176 212  ? ?Thyroid  ?Recent  Labs  ?Lab 07/25/21 ?09/27/21  ?TSH 12.447*  ?FREET4 0.85  ?  ?BNP ?Recent Labs  ?Lab 07/22/21 ?2244  ?BNP 237.3*  ?  ?DDimer No results for input(s): DDIMER in the last 168 hours.  ? ?Radiology  ?  ?No results found. ? ?Cardiac Studies  ? ?Cardiac MRI performed 07/23/2021: ?FINDINGS: ?1. Normal left ventricular size, with LVEDD 42 mm, and LVEDVi 71 ?mL/m2. ?  ?Mild asymmetric remodeling, with intraventricular septal thickness ?of 11 mm, posterior wall thickness of 9 mm, and myocardial mass ?index of 75 g/m2. ?  ?Mildly reduced left ventricular systolic function (LVEF =47%). There ?are regional wall motion abnormalities: Inferior and inferoseptal ?wall motion abnormalities, apical lateral hypokinesis. Suggestive of ?multi-vessel disease. ?IMPRESSION: ?There is mild decrease in LV function. ?  ?Study suggestive of viable, multi-vessel disease. ?  ?Moderate aortic regurgitation by velocity encoding. ? ?Patient Profile  ?   ?66 y.o. male acute heart failure and non-ST elevation MI in January with findings of severe diffuse diabetic CAD, LVEF less than 35%, and coronary anatomy not amenable to percutaneous intervention.  Trial of medical therapy work for approximately 2 months and he now presents with recurrent non-ST elevation MI with minimal elevation in troponin. ? ?Assessment & Plan  ?  ?Severe multivessel coronary artery disease: Surgery Monday.   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 ?DM II poorly controlled with cardiovascular complications: current therapy with insulin and will require SGLT-2 therapy post op. ? ? ? ?For questions or updates, please contact [facility] ?Please consult [email or web address] for contact info under  ? ?  ?   ?Signed, ?February, MD  ?07/28/2021, 6:27 PM   ? ?

## 2021-07-29 ENCOUNTER — Inpatient Hospital Stay (HOSPITAL_COMMUNITY)
Admission: EM | Disposition: A | Discharge status: Home / Self Care | Payer: Self-pay | Source: Home / Self Care | Attending: Surgery

## 2021-07-29 ENCOUNTER — Inpatient Hospital Stay (HOSPITAL_COMMUNITY): Payer: Medicare Other | Admitting: Anesthesiology

## 2021-07-29 ENCOUNTER — Encounter (HOSPITAL_COMMUNITY): Payer: Self-pay | Admitting: Internal Medicine

## 2021-07-29 ENCOUNTER — Other Ambulatory Visit: Payer: Self-pay

## 2021-07-29 ENCOUNTER — Inpatient Hospital Stay (HOSPITAL_COMMUNITY): Payer: Medicare Other

## 2021-07-29 DIAGNOSIS — I352 Nonrheumatic aortic (valve) stenosis with insufficiency: Secondary | ICD-10-CM

## 2021-07-29 DIAGNOSIS — E119 Type 2 diabetes mellitus without complications: Secondary | ICD-10-CM

## 2021-07-29 DIAGNOSIS — I2510 Atherosclerotic heart disease of native coronary artery without angina pectoris: Secondary | ICD-10-CM

## 2021-07-29 DIAGNOSIS — Z952 Presence of prosthetic heart valve: Secondary | ICD-10-CM

## 2021-07-29 DIAGNOSIS — I252 Old myocardial infarction: Secondary | ICD-10-CM

## 2021-07-29 DIAGNOSIS — I25110 Atherosclerotic heart disease of native coronary artery with unstable angina pectoris: Secondary | ICD-10-CM | POA: Diagnosis not present

## 2021-07-29 DIAGNOSIS — I350 Nonrheumatic aortic (valve) stenosis: Secondary | ICD-10-CM | POA: Diagnosis not present

## 2021-07-29 HISTORY — PX: CORONARY ARTERY BYPASS GRAFT: SHX141

## 2021-07-29 HISTORY — PX: AORTIC VALVE REPLACEMENT: SHX41

## 2021-07-29 HISTORY — PX: TEE WITHOUT CARDIOVERSION: SHX5443

## 2021-07-29 LAB — BASIC METABOLIC PANEL
Anion gap: 7 (ref 5–15)
Anion gap: 8 (ref 5–15)
BUN: 23 mg/dL (ref 8–23)
BUN: 16 mg/dL (ref 8–23)
CO2: 22 mmol/L (ref 22–32)
CO2: 20 mmol/L — ABNORMAL LOW (ref 22–32)
Calcium: 9 mg/dL (ref 8.9–10.3)
Calcium: 8.1 mg/dL — ABNORMAL LOW (ref 8.9–10.3)
Chloride: 107 mmol/L (ref 98–111)
Chloride: 108 mmol/L (ref 98–111)
Creatinine, Ser: 1.55 mg/dL — ABNORMAL HIGH (ref 0.61–1.24)
Creatinine, Ser: 1.21 mg/dL (ref 0.61–1.24)
GFR, Estimated: 49 mL/min — ABNORMAL LOW (ref >60)
GFR, Estimated: >60 mL/min (ref >60)
Glucose, Bld: 116 mg/dL — ABNORMAL HIGH (ref 70–99)
Glucose, Bld: 122 mg/dL — ABNORMAL HIGH (ref 70–99)
Potassium: 4.6 mmol/L (ref 3.5–5.1)
Potassium: 5.1 mmol/L (ref 3.5–5.1)
Sodium: 136 mmol/L (ref 135–145)
Sodium: 136 mmol/L (ref 135–145)

## 2021-07-29 LAB — POCT I-STAT, CHEM 8
BUN: 22 mg/dL (ref 8–23)
BUN: 21 mg/dL (ref 8–23)
BUN: 20 mg/dL (ref 8–23)
BUN: 20 mg/dL (ref 8–23)
BUN: 19 mg/dL (ref 8–23)
BUN: 20 mg/dL (ref 8–23)
Calcium, Ion: 1.23 mmol/L (ref 1.15–1.40)
Calcium, Ion: 1.25 mmol/L (ref 1.15–1.40)
Calcium, Ion: 1.11 mmol/L — ABNORMAL LOW (ref 1.15–1.40)
Calcium, Ion: 1.11 mmol/L — ABNORMAL LOW (ref 1.15–1.40)
Calcium, Ion: 1.03 mmol/L — ABNORMAL LOW (ref 1.15–1.40)
Calcium, Ion: 1.07 mmol/L — ABNORMAL LOW (ref 1.15–1.40)
Chloride: 104 mmol/L (ref 98–111)
Chloride: 107 mmol/L (ref 98–111)
Chloride: 103 mmol/L (ref 98–111)
Chloride: 103 mmol/L (ref 98–111)
Chloride: 102 mmol/L (ref 98–111)
Chloride: 102 mmol/L (ref 98–111)
Creatinine, Ser: 1.3 mg/dL — ABNORMAL HIGH (ref 0.61–1.24)
Creatinine, Ser: 1.2 mg/dL (ref 0.61–1.24)
Creatinine, Ser: 1.2 mg/dL (ref 0.61–1.24)
Creatinine, Ser: 1.2 mg/dL (ref 0.61–1.24)
Creatinine, Ser: 1.1 mg/dL (ref 0.61–1.24)
Creatinine, Ser: 1.2 mg/dL (ref 0.61–1.24)
Glucose, Bld: 171 mg/dL — ABNORMAL HIGH (ref 70–99)
Glucose, Bld: 115 mg/dL — ABNORMAL HIGH (ref 70–99)
Glucose, Bld: 102 mg/dL — ABNORMAL HIGH (ref 70–99)
Glucose, Bld: 138 mg/dL — ABNORMAL HIGH (ref 70–99)
Glucose, Bld: 131 mg/dL — ABNORMAL HIGH (ref 70–99)
Glucose, Bld: 136 mg/dL — ABNORMAL HIGH (ref 70–99)
HCT: 33 % — ABNORMAL LOW (ref 39.0–52.0)
HCT: 31 % — ABNORMAL LOW (ref 39.0–52.0)
HCT: 26 % — ABNORMAL LOW (ref 39.0–52.0)
HCT: 27 % — ABNORMAL LOW (ref 39.0–52.0)
HCT: 22 % — ABNORMAL LOW (ref 39.0–52.0)
HCT: 25 % — ABNORMAL LOW (ref 39.0–52.0)
Hemoglobin: 11.2 g/dL — ABNORMAL LOW (ref 13.0–17.0)
Hemoglobin: 10.5 g/dL — ABNORMAL LOW (ref 13.0–17.0)
Hemoglobin: 8.8 g/dL — ABNORMAL LOW (ref 13.0–17.0)
Hemoglobin: 9.2 g/dL — ABNORMAL LOW (ref 13.0–17.0)
Hemoglobin: 7.5 g/dL — ABNORMAL LOW (ref 13.0–17.0)
Hemoglobin: 8.5 g/dL — ABNORMAL LOW (ref 13.0–17.0)
Potassium: 4.6 mmol/L (ref 3.5–5.1)
Potassium: 4.5 mmol/L (ref 3.5–5.1)
Potassium: 5.1 mmol/L (ref 3.5–5.1)
Potassium: 5 mmol/L (ref 3.5–5.1)
Potassium: 5.1 mmol/L (ref 3.5–5.1)
Potassium: 5.3 mmol/L — ABNORMAL HIGH (ref 3.5–5.1)
Sodium: 136 mmol/L (ref 135–145)
Sodium: 138 mmol/L (ref 135–145)
Sodium: 137 mmol/L (ref 135–145)
Sodium: 137 mmol/L (ref 135–145)
Sodium: 135 mmol/L (ref 135–145)
Sodium: 137 mmol/L (ref 135–145)
TCO2: 24 mmol/L (ref 22–32)
TCO2: 24 mmol/L (ref 22–32)
TCO2: 25 mmol/L (ref 22–32)
TCO2: 24 mmol/L (ref 22–32)
TCO2: 24 mmol/L (ref 22–32)
TCO2: 25 mmol/L (ref 22–32)

## 2021-07-29 LAB — CBC
HCT: 33.8 % — ABNORMAL LOW (ref 39.0–52.0)
HCT: 26.7 % — ABNORMAL LOW (ref 39.0–52.0)
HCT: 23.5 % — ABNORMAL LOW (ref 39.0–52.0)
Hemoglobin: 11.3 g/dL — ABNORMAL LOW (ref 13.0–17.0)
Hemoglobin: 9.3 g/dL — ABNORMAL LOW (ref 13.0–17.0)
Hemoglobin: 8 g/dL — ABNORMAL LOW (ref 13.0–17.0)
MCH: 30.4 pg (ref 26.0–34.0)
MCH: 31.6 pg (ref 26.0–34.0)
MCH: 31.1 pg (ref 26.0–34.0)
MCHC: 33.4 g/dL (ref 30.0–36.0)
MCHC: 34.8 g/dL (ref 30.0–36.0)
MCHC: 34 g/dL (ref 30.0–36.0)
MCV: 90.9 fL (ref 80.0–100.0)
MCV: 90.8 fL (ref 80.0–100.0)
MCV: 91.4 fL (ref 80.0–100.0)
Platelets: 225 10*3/uL (ref 150–400)
Platelets: 125 10*3/uL — ABNORMAL LOW (ref 150–400)
Platelets: 138 10*3/uL — ABNORMAL LOW (ref 150–400)
RBC: 3.72 MIL/uL — ABNORMAL LOW (ref 4.22–5.81)
RBC: 2.94 MIL/uL — ABNORMAL LOW (ref 4.22–5.81)
RBC: 2.57 MIL/uL — ABNORMAL LOW (ref 4.22–5.81)
RDW: 14.6 % (ref 11.5–15.5)
RDW: 14.3 % (ref 11.5–15.5)
RDW: 14.7 % (ref 11.5–15.5)
WBC: 11.7 10*3/uL — ABNORMAL HIGH (ref 4.0–10.5)
WBC: 11.4 10*3/uL — ABNORMAL HIGH (ref 4.0–10.5)
WBC: 12.7 10*3/uL — ABNORMAL HIGH (ref 4.0–10.5)
nRBC: 0 % (ref 0.0–0.2)
nRBC: 0 % (ref 0.0–0.2)
nRBC: 0 % (ref 0.0–0.2)

## 2021-07-29 LAB — POCT I-STAT 7, (LYTES, BLD GAS, ICA,H+H)
Acid-Base Excess: 0 mmol/L (ref 0.0–2.0)
Acid-Base Excess: 0 mmol/L (ref 0.0–2.0)
Acid-base deficit: 3 mmol/L — ABNORMAL HIGH (ref 0.0–2.0)
Acid-base deficit: 1 mmol/L (ref 0.0–2.0)
Acid-base deficit: 2 mmol/L (ref 0.0–2.0)
Acid-base deficit: 4 mmol/L — ABNORMAL HIGH (ref 0.0–2.0)
Acid-base deficit: 3 mmol/L — ABNORMAL HIGH (ref 0.0–2.0)
Bicarbonate: 21.9 mmol/L (ref 20.0–28.0)
Bicarbonate: 24.5 mmol/L (ref 20.0–28.0)
Bicarbonate: 24.1 mmol/L (ref 20.0–28.0)
Bicarbonate: 22.6 mmol/L (ref 20.0–28.0)
Bicarbonate: 24.1 mmol/L (ref 20.0–28.0)
Bicarbonate: 20.9 mmol/L (ref 20.0–28.0)
Bicarbonate: 21.9 mmol/L (ref 20.0–28.0)
Calcium, Ion: 1.24 mmol/L (ref 1.15–1.40)
Calcium, Ion: 1.07 mmol/L — ABNORMAL LOW (ref 1.15–1.40)
Calcium, Ion: 1.11 mmol/L — ABNORMAL LOW (ref 1.15–1.40)
Calcium, Ion: 1.1 mmol/L — ABNORMAL LOW (ref 1.15–1.40)
Calcium, Ion: 1.03 mmol/L — ABNORMAL LOW (ref 1.15–1.40)
Calcium, Ion: 1.28 mmol/L (ref 1.15–1.40)
Calcium, Ion: 1.24 mmol/L (ref 1.15–1.40)
HCT: 33 % — ABNORMAL LOW (ref 39.0–52.0)
HCT: 24 % — ABNORMAL LOW (ref 39.0–52.0)
HCT: 26 % — ABNORMAL LOW (ref 39.0–52.0)
HCT: 25 % — ABNORMAL LOW (ref 39.0–52.0)
HCT: 27 % — ABNORMAL LOW (ref 39.0–52.0)
HCT: 28 % — ABNORMAL LOW (ref 39.0–52.0)
HCT: 22 % — ABNORMAL LOW (ref 39.0–52.0)
Hemoglobin: 11.2 g/dL — ABNORMAL LOW (ref 13.0–17.0)
Hemoglobin: 8.2 g/dL — ABNORMAL LOW (ref 13.0–17.0)
Hemoglobin: 8.8 g/dL — ABNORMAL LOW (ref 13.0–17.0)
Hemoglobin: 8.5 g/dL — ABNORMAL LOW (ref 13.0–17.0)
Hemoglobin: 9.2 g/dL — ABNORMAL LOW (ref 13.0–17.0)
Hemoglobin: 9.5 g/dL — ABNORMAL LOW (ref 13.0–17.0)
Hemoglobin: 7.5 g/dL — ABNORMAL LOW (ref 13.0–17.0)
O2 Saturation: 100 %
O2 Saturation: 100 %
O2 Saturation: 100 %
O2 Saturation: 100 %
O2 Saturation: 100 %
O2 Saturation: 97 %
O2 Saturation: 99 %
Patient temperature: 36.4
Patient temperature: 36.7
Potassium: 4.6 mmol/L (ref 3.5–5.1)
Potassium: 4.7 mmol/L (ref 3.5–5.1)
Potassium: 5.1 mmol/L (ref 3.5–5.1)
Potassium: 5.2 mmol/L — ABNORMAL HIGH (ref 3.5–5.1)
Potassium: 5.1 mmol/L (ref 3.5–5.1)
Potassium: 4.9 mmol/L (ref 3.5–5.1)
Potassium: 5.1 mmol/L (ref 3.5–5.1)
Sodium: 136 mmol/L (ref 135–145)
Sodium: 136 mmol/L (ref 135–145)
Sodium: 137 mmol/L (ref 135–145)
Sodium: 136 mmol/L (ref 135–145)
Sodium: 137 mmol/L (ref 135–145)
Sodium: 137 mmol/L (ref 135–145)
Sodium: 137 mmol/L (ref 135–145)
TCO2: 23 mmol/L (ref 22–32)
TCO2: 26 mmol/L (ref 22–32)
TCO2: 25 mmol/L (ref 22–32)
TCO2: 24 mmol/L (ref 22–32)
TCO2: 25 mmol/L (ref 22–32)
TCO2: 22 mmol/L (ref 22–32)
TCO2: 23 mmol/L (ref 22–32)
pCO2 arterial: 36.1 mmHg (ref 32–48)
pCO2 arterial: 38.3 mmHg (ref 32–48)
pCO2 arterial: 39.1 mmHg (ref 32–48)
pCO2 arterial: 38.3 mmHg (ref 32–48)
pCO2 arterial: 37.7 mmHg (ref 32–48)
pCO2 arterial: 33.7 mmHg (ref 32–48)
pCO2 arterial: 38.9 mmHg (ref 32–48)
pH, Arterial: 7.391 (ref 7.35–7.45)
pH, Arterial: 7.413 (ref 7.35–7.45)
pH, Arterial: 7.397 (ref 7.35–7.45)
pH, Arterial: 7.379 (ref 7.35–7.45)
pH, Arterial: 7.414 (ref 7.35–7.45)
pH, Arterial: 7.398 (ref 7.35–7.45)
pH, Arterial: 7.357 (ref 7.35–7.45)
pO2, Arterial: 286 mmHg — ABNORMAL HIGH (ref 83–108)
pO2, Arterial: 360 mmHg — ABNORMAL HIGH (ref 83–108)
pO2, Arterial: 259 mmHg — ABNORMAL HIGH (ref 83–108)
pO2, Arterial: 251 mmHg — ABNORMAL HIGH (ref 83–108)
pO2, Arterial: 327 mmHg — ABNORMAL HIGH (ref 83–108)
pO2, Arterial: 87 mmHg (ref 83–108)
pO2, Arterial: 152 mmHg — ABNORMAL HIGH (ref 83–108)

## 2021-07-29 LAB — POCT I-STAT EG7
Acid-base deficit: 1 mmol/L (ref 0.0–2.0)
Bicarbonate: 24.2 mmol/L (ref 20.0–28.0)
Calcium, Ion: 1.12 mmol/L — ABNORMAL LOW (ref 1.15–1.40)
HCT: 23 % — ABNORMAL LOW (ref 39.0–52.0)
Hemoglobin: 7.8 g/dL — ABNORMAL LOW (ref 13.0–17.0)
O2 Saturation: 77 %
Potassium: 4.5 mmol/L (ref 3.5–5.1)
Sodium: 137 mmol/L (ref 135–145)
TCO2: 25 mmol/L (ref 22–32)
pCO2, Ven: 40.5 mmHg — ABNORMAL LOW (ref 44–60)
pH, Ven: 7.384 (ref 7.25–7.43)
pO2, Ven: 42 mmHg (ref 32–45)

## 2021-07-29 LAB — PLATELET COUNT: Platelets: 158 10*3/uL (ref 150–400)

## 2021-07-29 LAB — GLUCOSE, CAPILLARY
Glucose-Capillary: 112 mg/dL — ABNORMAL HIGH (ref 70–99)
Glucose-Capillary: 124 mg/dL — ABNORMAL HIGH (ref 70–99)
Glucose-Capillary: 125 mg/dL — ABNORMAL HIGH (ref 70–99)
Glucose-Capillary: 111 mg/dL — ABNORMAL HIGH (ref 70–99)
Glucose-Capillary: 101 mg/dL — ABNORMAL HIGH (ref 70–99)
Glucose-Capillary: 110 mg/dL — ABNORMAL HIGH (ref 70–99)
Glucose-Capillary: 131 mg/dL — ABNORMAL HIGH (ref 70–99)
Glucose-Capillary: 131 mg/dL — ABNORMAL HIGH (ref 70–99)

## 2021-07-29 LAB — HEMOGLOBIN AND HEMATOCRIT, BLOOD
HCT: 24.2 % — ABNORMAL LOW (ref 39.0–52.0)
Hemoglobin: 8.4 g/dL — ABNORMAL LOW (ref 13.0–17.0)

## 2021-07-29 LAB — PROTIME-INR
INR: 1.6 — ABNORMAL HIGH (ref 0.8–1.2)
Prothrombin Time: 19.1 seconds — ABNORMAL HIGH (ref 11.4–15.2)

## 2021-07-29 LAB — APTT
aPTT: 74 seconds — ABNORMAL HIGH (ref 24–36)
aPTT: 43 seconds — ABNORMAL HIGH (ref 24–36)

## 2021-07-29 LAB — HEPARIN LEVEL (UNFRACTIONATED): Heparin Unfractionated: >1.1 IU/mL — ABNORMAL HIGH (ref 0.30–0.70)

## 2021-07-29 LAB — PREPARE RBC (CROSSMATCH)

## 2021-07-29 LAB — MAGNESIUM: Magnesium: 1.9 mg/dL (ref 1.7–2.4)

## 2021-07-29 SURGERY — CORONARY ARTERY BYPASS GRAFTING (CABG)
Anesthesia: General | Site: Chest

## 2021-07-29 MED ORDER — PROTAMINE SULFATE 10 MG/ML IV SOLN
INTRAVENOUS | Status: DC | PRN
  Administered 2021-07-29: 220 mg via INTRAVENOUS
  Administered 2021-07-29: 10 mg via INTRAVENOUS

## 2021-07-29 MED ORDER — LACTATED RINGERS IV SOLN
500.0000 mL | Freq: Once | INTRAVENOUS | Status: AC | PRN
  Administered 2021-07-29: 500 mL via INTRAVENOUS

## 2021-07-29 MED ORDER — MUPIROCIN 2 % EX OINT
1.0000 "application " | TOPICAL_OINTMENT | Freq: Two times a day (BID) | CUTANEOUS | Status: AC
  Administered 2021-07-29 – 2021-08-03 (×10): 1 via NASAL
  Filled 2021-07-29 (×3): qty 22

## 2021-07-29 MED ORDER — INSULIN REGULAR(HUMAN) IN NACL 100-0.9 UT/100ML-% IV SOLN
INTRAVENOUS | Status: DC
  Administered 2021-07-29: 1.5 [IU]/h via INTRAVENOUS

## 2021-07-29 MED ORDER — CEFAZOLIN SODIUM-DEXTROSE 2-4 GM/100ML-% IV SOLN
2.0000 g | Freq: Three times a day (TID) | INTRAVENOUS | Status: AC
  Administered 2021-07-29 – 2021-07-31 (×6): 2 g via INTRAVENOUS
  Filled 2021-07-29 (×5): qty 100

## 2021-07-29 MED ORDER — DOPAMINE-DEXTROSE 3.2-5 MG/ML-% IV SOLN
INTRAVENOUS | Status: DC | PRN
  Administered 2021-07-29: 3 ug/kg/min via INTRAVENOUS

## 2021-07-29 MED ORDER — ALBUMIN HUMAN 5 % IV SOLN
250.0000 mL | INTRAVENOUS | Status: AC | PRN
  Administered 2021-07-29 (×4): 12.5 g via INTRAVENOUS
  Filled 2021-07-29 (×2): qty 250

## 2021-07-29 MED ORDER — THROMBIN (RECOMBINANT) 20000 UNITS EX SOLR
CUTANEOUS | Status: AC
  Filled 2021-07-29: qty 20000

## 2021-07-29 MED ORDER — LACTATED RINGERS IV SOLN: INTRAVENOUS | Status: DC | PRN

## 2021-07-29 MED ORDER — FENTANYL CITRATE (PF) 250 MCG/5ML IJ SOLN
INTRAMUSCULAR | Status: AC
Start: 2021-07-29 — End: ?
  Filled 2021-07-29: qty 5

## 2021-07-29 MED ORDER — ROCURONIUM BROMIDE 10 MG/ML (PF) SYRINGE
PREFILLED_SYRINGE | INTRAVENOUS | Status: AC
  Filled 2021-07-29: qty 20

## 2021-07-29 MED ORDER — ASPIRIN EC 325 MG PO TBEC
325.0000 mg | DELAYED_RELEASE_TABLET | Freq: Every day | ORAL | Status: DC
  Administered 2021-07-30 – 2021-07-31 (×2): 325 mg via ORAL
  Filled 2021-07-29 (×2): qty 1

## 2021-07-29 MED ORDER — 0.9 % SODIUM CHLORIDE (POUR BTL) OPTIME
TOPICAL | Status: DC | PRN
  Administered 2021-07-29: 6000 mL

## 2021-07-29 MED ORDER — FENTANYL CITRATE (PF) 250 MCG/5ML IJ SOLN
INTRAMUSCULAR | Status: AC
  Filled 2021-07-29: qty 5

## 2021-07-29 MED ORDER — MIDAZOLAM HCL 5 MG/5ML IJ SOLN
INTRAMUSCULAR | Status: DC | PRN
  Administered 2021-07-29: 2 mg via INTRAVENOUS
  Administered 2021-07-29 (×3): 1 mg via INTRAVENOUS
  Administered 2021-07-29: 2 mg via INTRAVENOUS

## 2021-07-29 MED ORDER — PHENYLEPHRINE HCL-NACL 20-0.9 MG/250ML-% IV SOLN: 0.0000 ug/min | INTRAVENOUS | Status: DC

## 2021-07-29 MED ORDER — LACTATED RINGERS IV SOLN: INTRAVENOUS | Status: DC

## 2021-07-29 MED ORDER — BISACODYL 10 MG RE SUPP: 10.0000 mg | Freq: Every day | RECTAL | Status: DC

## 2021-07-29 MED ORDER — HEMOSTATIC AGENTS (NO CHARGE) OPTIME
TOPICAL | Status: DC | PRN
  Administered 2021-07-29: 1 via TOPICAL

## 2021-07-29 MED ORDER — SODIUM CHLORIDE 0.45 % IV SOLN: INTRAVENOUS | Status: DC | PRN

## 2021-07-29 MED ORDER — NITROGLYCERIN IN D5W 200-5 MCG/ML-% IV SOLN: 0.0000 ug/min | INTRAVENOUS | Status: DC

## 2021-07-29 MED ORDER — PLASMA-LYTE A IV SOLN
INTRAVENOUS | Status: DC | PRN
  Administered 2021-07-29: 500 mL via INTRAVASCULAR

## 2021-07-29 MED ORDER — BISACODYL 5 MG PO TBEC
10.0000 mg | DELAYED_RELEASE_TABLET | Freq: Every day | ORAL | Status: DC
  Administered 2021-07-30 – 2021-08-02 (×4): 10 mg via ORAL
  Filled 2021-07-29 (×4): qty 2

## 2021-07-29 MED ORDER — GELATIN ABSORBABLE MT POWD
OROMUCOSAL | Status: DC | PRN
  Administered 2021-07-29 (×3): 4 mL via TOPICAL

## 2021-07-29 MED ORDER — CHLORHEXIDINE GLUCONATE 0.12 % MT SOLN
15.0000 mL | OROMUCOSAL | Status: AC
  Administered 2021-07-29: 15 mL via OROMUCOSAL

## 2021-07-29 MED ORDER — ATORVASTATIN CALCIUM 80 MG PO TABS
80.0000 mg | ORAL_TABLET | Freq: Every day | ORAL | Status: DC
  Administered 2021-07-30 – 2021-08-05 (×7): 80 mg via ORAL
  Filled 2021-07-29 (×7): qty 1

## 2021-07-29 MED ORDER — METOPROLOL TARTRATE 25 MG/10 ML ORAL SUSPENSION: 12.5000 mg | Freq: Two times a day (BID) | ORAL | Status: DC

## 2021-07-29 MED ORDER — DEXMEDETOMIDINE HCL IN NACL 400 MCG/100ML IV SOLN
0.0000 ug/kg/h | INTRAVENOUS | Status: DC
  Administered 2021-07-29: 0.6 ug/kg/h via INTRAVENOUS

## 2021-07-29 MED ORDER — TRAMADOL HCL 50 MG PO TABS: 50.0000 mg | ORAL_TABLET | ORAL | Status: DC | PRN

## 2021-07-29 MED ORDER — POTASSIUM CHLORIDE 10 MEQ/50ML IV SOLN: 10.0000 meq | INTRAVENOUS | Status: AC

## 2021-07-29 MED ORDER — HEPARIN SODIUM (PORCINE) 1000 UNIT/ML IJ SOLN
INTRAMUSCULAR | Status: AC
  Filled 2021-07-29: qty 1

## 2021-07-29 MED ORDER — SODIUM CHLORIDE 0.9 % IV SOLN: 10.0000 mL/h | Freq: Once | INTRAVENOUS | Status: AC

## 2021-07-29 MED ORDER — THROMBIN 20000 UNITS EX SOLR
CUTANEOUS | Status: DC | PRN
  Administered 2021-07-29: 20000 [IU] via TOPICAL

## 2021-07-29 MED ORDER — MORPHINE SULFATE (PF) 2 MG/ML IV SOLN: 1.0000 mg | INTRAVENOUS | Status: DC | PRN

## 2021-07-29 MED ORDER — SODIUM CHLORIDE 0.9 % IV SOLN: INTRAVENOUS | Status: DC

## 2021-07-29 MED ORDER — PANTOPRAZOLE SODIUM 40 MG PO TBEC
40.0000 mg | DELAYED_RELEASE_TABLET | Freq: Every day | ORAL | Status: DC
  Administered 2021-07-31 – 2021-08-05 (×6): 40 mg via ORAL
  Filled 2021-07-29 (×6): qty 1

## 2021-07-29 MED ORDER — ACETAMINOPHEN 160 MG/5ML PO SOLN: 650.0000 mg | Freq: Once | ORAL | Status: AC

## 2021-07-29 MED ORDER — ALBUMIN HUMAN 5 % IV SOLN: INTRAVENOUS | Status: DC | PRN

## 2021-07-29 MED ORDER — CALCIUM CHLORIDE 10 % IV SOLN
INTRAVENOUS | Status: DC | PRN
  Administered 2021-07-29: 500 mg via INTRAVENOUS
  Administered 2021-07-29: 200 mg via INTRAVENOUS

## 2021-07-29 MED ORDER — ACETAMINOPHEN 650 MG RE SUPP
650.0000 mg | Freq: Once | RECTAL | Status: AC
  Administered 2021-07-29: 650 mg via RECTAL

## 2021-07-29 MED ORDER — MIDAZOLAM HCL (PF) 10 MG/2ML IJ SOLN
INTRAMUSCULAR | Status: AC
  Filled 2021-07-29: qty 2

## 2021-07-29 MED ORDER — MIDAZOLAM HCL 2 MG/2ML IJ SOLN: 2.0000 mg | INTRAMUSCULAR | Status: DC | PRN

## 2021-07-29 MED ORDER — PROTAMINE SULFATE 10 MG/ML IV SOLN
INTRAVENOUS | Status: AC
  Filled 2021-07-29: qty 25

## 2021-07-29 MED ORDER — ACETAMINOPHEN 160 MG/5ML PO SOLN: 1000.0000 mg | Freq: Four times a day (QID) | ORAL | Status: DC

## 2021-07-29 MED ORDER — ROCURONIUM BROMIDE 10 MG/ML (PF) SYRINGE
PREFILLED_SYRINGE | INTRAVENOUS | Status: DC | PRN
  Administered 2021-07-29: 20 mg via INTRAVENOUS
  Administered 2021-07-29: 30 mg via INTRAVENOUS
  Administered 2021-07-29: 80 mg via INTRAVENOUS

## 2021-07-29 MED ORDER — ASPIRIN 81 MG PO CHEW: 324.0000 mg | CHEWABLE_TABLET | Freq: Every day | ORAL | Status: DC

## 2021-07-29 MED ORDER — ONDANSETRON HCL 4 MG/2ML IJ SOLN
4.0000 mg | Freq: Four times a day (QID) | INTRAMUSCULAR | Status: DC | PRN
  Administered 2021-07-30 – 2021-08-01 (×4): 4 mg via INTRAVENOUS
  Filled 2021-07-29 (×4): qty 2

## 2021-07-29 MED ORDER — CHLORHEXIDINE GLUCONATE CLOTH 2 % EX PADS
6.0000 | MEDICATED_PAD | Freq: Every day | CUTANEOUS | Status: DC
  Administered 2021-07-30 – 2021-08-02 (×5): 6 via TOPICAL

## 2021-07-29 MED ORDER — MAGNESIUM SULFATE 4 GM/100ML IV SOLN
4.0000 g | Freq: Once | INTRAVENOUS | Status: AC
  Administered 2021-07-29: 4 g via INTRAVENOUS
  Filled 2021-07-29: qty 100

## 2021-07-29 MED ORDER — OXYCODONE HCL 5 MG PO TABS
5.0000 mg | ORAL_TABLET | ORAL | Status: DC | PRN
  Administered 2021-07-30: 5 mg via ORAL
  Administered 2021-08-02 (×2): 10 mg via ORAL
  Filled 2021-07-29 (×2): qty 2
  Filled 2021-07-29: qty 1

## 2021-07-29 MED ORDER — DEXTROSE 50 % IV SOLN: 0.0000 mL | INTRAVENOUS | Status: DC | PRN

## 2021-07-29 MED ORDER — VANCOMYCIN HCL IN DEXTROSE 1-5 GM/200ML-% IV SOLN
1000.0000 mg | Freq: Once | INTRAVENOUS | Status: AC
  Administered 2021-07-29: 1000 mg via INTRAVENOUS
  Filled 2021-07-29: qty 200

## 2021-07-29 MED ORDER — SODIUM CHLORIDE 0.9% FLUSH
3.0000 mL | Freq: Two times a day (BID) | INTRAVENOUS | Status: DC
  Administered 2021-07-30 – 2021-08-02 (×6): 3 mL via INTRAVENOUS

## 2021-07-29 MED ORDER — PHENYLEPHRINE 40 MCG/ML (10ML) SYRINGE FOR IV PUSH (FOR BLOOD PRESSURE SUPPORT)
PREFILLED_SYRINGE | INTRAVENOUS | Status: DC | PRN
  Administered 2021-07-29 (×4): 40 ug via INTRAVENOUS

## 2021-07-29 MED ORDER — FENTANYL CITRATE (PF) 250 MCG/5ML IJ SOLN
INTRAMUSCULAR | Status: DC | PRN
  Administered 2021-07-29: 75 ug via INTRAVENOUS
  Administered 2021-07-29: 100 ug via INTRAVENOUS
  Administered 2021-07-29 (×2): 50 ug via INTRAVENOUS
  Administered 2021-07-29: 25 ug via INTRAVENOUS
  Administered 2021-07-29: 100 ug via INTRAVENOUS
  Administered 2021-07-29 (×4): 50 ug via INTRAVENOUS

## 2021-07-29 MED ORDER — PROPOFOL 10 MG/ML IV BOLUS
INTRAVENOUS | Status: AC
  Filled 2021-07-29: qty 20

## 2021-07-29 MED ORDER — PROPOFOL 10 MG/ML IV BOLUS
INTRAVENOUS | Status: DC | PRN
  Administered 2021-07-29: 60 mg via INTRAVENOUS

## 2021-07-29 MED ORDER — DOCUSATE SODIUM 100 MG PO CAPS
200.0000 mg | ORAL_CAPSULE | Freq: Every day | ORAL | Status: DC
  Administered 2021-07-30 – 2021-08-02 (×4): 200 mg via ORAL
  Filled 2021-07-29 (×4): qty 2

## 2021-07-29 MED ORDER — SODIUM CHLORIDE 0.9 % IV SOLN: 250.0000 mL | INTRAVENOUS | Status: DC

## 2021-07-29 MED ORDER — FAMOTIDINE IN NACL 20-0.9 MG/50ML-% IV SOLN
20.0000 mg | Freq: Two times a day (BID) | INTRAVENOUS | Status: AC
  Administered 2021-07-29: 20 mg via INTRAVENOUS
  Filled 2021-07-29: qty 50

## 2021-07-29 MED ORDER — SODIUM CHLORIDE 0.9% FLUSH: 3.0000 mL | INTRAVENOUS | Status: DC | PRN

## 2021-07-29 MED ORDER — METOPROLOL TARTRATE 12.5 MG HALF TABLET: 12.5000 mg | ORAL_TABLET | Freq: Two times a day (BID) | ORAL | Status: DC

## 2021-07-29 MED ORDER — HEPARIN SODIUM (PORCINE) 1000 UNIT/ML IJ SOLN
INTRAMUSCULAR | Status: DC | PRN
  Administered 2021-07-29: 23000 [IU] via INTRAVENOUS

## 2021-07-29 MED ORDER — PHENYLEPHRINE 40 MCG/ML (10ML) SYRINGE FOR IV PUSH (FOR BLOOD PRESSURE SUPPORT)
PREFILLED_SYRINGE | INTRAVENOUS | Status: AC
  Filled 2021-07-29: qty 10

## 2021-07-29 MED ORDER — LEVOTHYROXINE SODIUM 75 MCG PO TABS
75.0000 ug | ORAL_TABLET | Freq: Every day | ORAL | Status: DC
  Administered 2021-07-30 – 2021-08-05 (×7): 75 ug via ORAL
  Filled 2021-07-29 (×6): qty 1

## 2021-07-29 MED ORDER — ACETAMINOPHEN 500 MG PO TABS
1000.0000 mg | ORAL_TABLET | Freq: Four times a day (QID) | ORAL | Status: DC
  Administered 2021-07-30 – 2021-08-02 (×12): 1000 mg via ORAL
  Filled 2021-07-29 (×12): qty 2

## 2021-07-29 MED ORDER — METOPROLOL TARTRATE 5 MG/5ML IV SOLN: 2.5000 mg | INTRAVENOUS | Status: DC | PRN

## 2021-07-29 SURGICAL SUPPLY — 125 items
ADAPTER CARDIO PERF ANTE/RETRO (ADAPTER) ×3 IMPLANT
ADH SKN CLS APL DERMABOND .7 (GAUZE/BANDAGES/DRESSINGS) ×2
ADPR PRFSN 84XANTGRD RTRGD (ADAPTER) ×2
BAG DECANTER FOR FLEXI CONT (MISCELLANEOUS) ×3 IMPLANT
BLADE CLIPPER SURG (BLADE) ×3 IMPLANT
BLADE STERNUM SYSTEM 6 (BLADE) ×3 IMPLANT
BLADE SURG 15 STRL LF DISP TIS (BLADE) ×2 IMPLANT
BLADE SURG 15 STRL SS (BLADE) ×3
BNDG CMPR MED 10X6 ELC LF (GAUZE/BANDAGES/DRESSINGS) ×2
BNDG ELASTIC 4X5.8 VLCR STR LF (GAUZE/BANDAGES/DRESSINGS) ×3 IMPLANT
BNDG ELASTIC 6X10 VLCR STRL LF (GAUZE/BANDAGES/DRESSINGS) ×1 IMPLANT
BNDG ELASTIC 6X5.8 VLCR STR LF (GAUZE/BANDAGES/DRESSINGS) ×3 IMPLANT
BNDG GAUZE ELAST 4 BULKY (GAUZE/BANDAGES/DRESSINGS) ×3 IMPLANT
CANISTER SUCT 3000ML PPV (MISCELLANEOUS) ×3 IMPLANT
CANNULA GUNDRY RCSP 15FR (MISCELLANEOUS) ×3 IMPLANT
CATH HEART VENT LEFT (CATHETERS) ×2 IMPLANT
CATH ROBINSON RED A/P 18FR (CATHETERS) ×9 IMPLANT
CATH THORACIC 28FR (CATHETERS) ×3 IMPLANT
CATH THORACIC 36FR (CATHETERS) ×3 IMPLANT
CATH THORACIC 36FR RT ANG (CATHETERS) ×3 IMPLANT
CLIP TI WIDE RED SMALL 24 (CLIP) ×1 IMPLANT
CLIP VESOCCLUDE MED 24/CT (CLIP) IMPLANT
CLIP VESOCCLUDE SM WIDE 24/CT (CLIP) IMPLANT
CNTNR URN SCR LID CUP LEK RST (MISCELLANEOUS) ×2 IMPLANT
CONT SPEC 4OZ STRL OR WHT (MISCELLANEOUS) ×3
CONTAINER PROTECT SURGISLUSH (MISCELLANEOUS) ×6 IMPLANT
COVER SURGICAL LIGHT HANDLE (MISCELLANEOUS) ×3 IMPLANT
DERMABOND ADVANCED (GAUZE/BANDAGES/DRESSINGS) ×1
DERMABOND ADVANCED .7 DNX12 (GAUZE/BANDAGES/DRESSINGS) IMPLANT
DEVICE SUT CK QUICK LOAD MINI (Prosthesis & Implant Heart) ×1 IMPLANT
DRAPE CARDIOVASCULAR INCISE (DRAPES) ×3
DRAPE SRG 135X102X78XABS (DRAPES) ×2 IMPLANT
DRAPE WARM FLUID 44X44 (DRAPES) ×3 IMPLANT
DRSG COVADERM 4X14 (GAUZE/BANDAGES/DRESSINGS) ×3 IMPLANT
ELECT CAUTERY BLADE 6.4 (BLADE) ×3 IMPLANT
ELECT REM PT RETURN 9FT ADLT (ELECTROSURGICAL) ×6
ELECTRODE REM PT RTRN 9FT ADLT (ELECTROSURGICAL) ×4 IMPLANT
FELT TEFLON 1X6 (MISCELLANEOUS) ×6 IMPLANT
GAUZE 4X4 16PLY ~~LOC~~+RFID DBL (SPONGE) ×3 IMPLANT
GAUZE SPONGE 4X4 12PLY STRL (GAUZE/BANDAGES/DRESSINGS) ×6 IMPLANT
GLOVE SURG ENC MOIS LTX SZ6 (GLOVE) IMPLANT
GLOVE SURG ENC MOIS LTX SZ6.5 (GLOVE) ×2 IMPLANT
GLOVE SURG ENC MOIS LTX SZ7 (GLOVE) IMPLANT
GLOVE SURG ENC MOIS LTX SZ7.5 (GLOVE) IMPLANT
GLOVE SURG GAMMEX LF SZ7 (GLOVE) ×3 IMPLANT
GLOVE SURG MICRO LTX SZ6 (GLOVE) ×4 IMPLANT
GLOVE SURG MICRO LTX SZ7 (GLOVE) ×6 IMPLANT
GLOVE SURG ORTHO LTX SZ7.5 (GLOVE) IMPLANT
GLOVE SURG UNDER POLY LF SZ6 (GLOVE) IMPLANT
GLOVE SURG UNDER POLY LF SZ6.5 (GLOVE) IMPLANT
GLOVE SURG UNDER POLY LF SZ7 (GLOVE) IMPLANT
GOWN STRL REUS W/ TWL LRG LVL3 (GOWN DISPOSABLE) ×8 IMPLANT
GOWN STRL REUS W/ TWL XL LVL3 (GOWN DISPOSABLE) ×2 IMPLANT
GOWN STRL REUS W/TWL LRG LVL3 (GOWN DISPOSABLE) ×24
GOWN STRL REUS W/TWL XL LVL3 (GOWN DISPOSABLE) ×3
HEMOSTAT POWDER SURGIFOAM 1G (HEMOSTASIS) ×9 IMPLANT
HEMOSTAT SURGICEL 2X14 (HEMOSTASIS) ×3 IMPLANT
INSERT FOGARTY 61MM (MISCELLANEOUS) IMPLANT
INSERT FOGARTY XLG (MISCELLANEOUS) IMPLANT
KIT BASIN OR (CUSTOM PROCEDURE TRAY) ×3 IMPLANT
KIT CATH CPB BARTLE (MISCELLANEOUS) ×3 IMPLANT
KIT SUCTION CATH 14FR (SUCTIONS) ×3 IMPLANT
KIT SUT CK MINI COMBO 4X17 (Prosthesis & Implant Heart) ×1 IMPLANT
KIT TURNOVER KIT B (KITS) ×3 IMPLANT
KIT VASOVIEW HEMOPRO 2 VH 4000 (KITS) ×3 IMPLANT
LINE VENT (MISCELLANEOUS) ×1 IMPLANT
NS IRRIG 1000ML POUR BTL (IV SOLUTION) ×18 IMPLANT
PACK E OPEN HEART (SUTURE) ×3 IMPLANT
PACK OPEN HEART (CUSTOM PROCEDURE TRAY) ×3 IMPLANT
PAD ARMBOARD 7.5X6 YLW CONV (MISCELLANEOUS) ×6 IMPLANT
PAD ELECT DEFIB RADIOL ZOLL (MISCELLANEOUS) ×3 IMPLANT
PENCIL BUTTON HOLSTER BLD 10FT (ELECTRODE) ×3 IMPLANT
POSITIONER HEAD DONUT 9IN (MISCELLANEOUS) ×3 IMPLANT
PUNCH AORTIC ROTATE 4.0MM (MISCELLANEOUS) ×1 IMPLANT
PUNCH AORTIC ROTATE 4.5MM 8IN (MISCELLANEOUS) ×3 IMPLANT
PUNCH AORTIC ROTATE 5MM 8IN (MISCELLANEOUS) IMPLANT
SET MPS 3-ND DEL (MISCELLANEOUS) ×1 IMPLANT
SPONGE INTESTINAL PEANUT (DISPOSABLE) IMPLANT
SPONGE T-LAP 18X18 ~~LOC~~+RFID (SPONGE) ×12 IMPLANT
SPONGE T-LAP 4X18 ~~LOC~~+RFID (SPONGE) ×5 IMPLANT
SUPPORT HEART JANKE-BARRON (MISCELLANEOUS) ×3 IMPLANT
SUT BONE WAX W31G (SUTURE) ×3 IMPLANT
SUT EB EXC GRN/WHT 2-0 V-5 (SUTURE) ×8 IMPLANT
SUT ETHIBON EXCEL 2-0 V-5 (SUTURE) ×2 IMPLANT
SUT ETHIBOND 2 0 SH (SUTURE) ×3
SUT ETHIBOND 2 0 SH 36X2 (SUTURE) IMPLANT
SUT ETHIBOND V-5 VALVE (SUTURE) ×2 IMPLANT
SUT MNCRL AB 4-0 PS2 18 (SUTURE) IMPLANT
SUT PROLENE 3 0 SH DA (SUTURE) IMPLANT
SUT PROLENE 3 0 SH1 36 (SUTURE) ×3 IMPLANT
SUT PROLENE 4 0 RB 1 (SUTURE) ×12
SUT PROLENE 4 0 SH DA (SUTURE) IMPLANT
SUT PROLENE 4-0 RB1 .5 CRCL 36 (SUTURE) ×6 IMPLANT
SUT PROLENE 5 0 C 1 36 (SUTURE) IMPLANT
SUT PROLENE 6 0 C 1 30 (SUTURE) IMPLANT
SUT PROLENE 7 0 BV 1 (SUTURE) IMPLANT
SUT PROLENE 7 0 BV1 MDA (SUTURE) ×4 IMPLANT
SUT PROLENE 8 0 BV175 6 (SUTURE) IMPLANT
SUT SILK  1 MH (SUTURE)
SUT SILK 1 MH (SUTURE) IMPLANT
SUT SILK 2 0 SH (SUTURE) IMPLANT
SUT STEEL 6MS V (SUTURE) ×2 IMPLANT
SUT STEEL STERNAL CCS#1 18IN (SUTURE) IMPLANT
SUT STEEL SZ 6 DBL 3X14 BALL (SUTURE) IMPLANT
SUT VIC AB 1 CTX 36 (SUTURE) ×6
SUT VIC AB 1 CTX36XBRD ANBCTR (SUTURE) ×4 IMPLANT
SUT VIC AB 2-0 CT1 27 (SUTURE) ×3
SUT VIC AB 2-0 CT1 TAPERPNT 27 (SUTURE) IMPLANT
SUT VIC AB 2-0 CTX 27 (SUTURE) IMPLANT
SUT VIC AB 3-0 SH 27 (SUTURE)
SUT VIC AB 3-0 SH 27X BRD (SUTURE) IMPLANT
SUT VIC AB 3-0 X1 27 (SUTURE) ×1 IMPLANT
SUT VICRYL 4-0 PS2 18IN ABS (SUTURE) IMPLANT
SYSTEM SAHARA CHEST DRAIN ATS (WOUND CARE) ×3 IMPLANT
TAPE CLOTH 4X10 WHT NS (GAUZE/BANDAGES/DRESSINGS) ×1 IMPLANT
TAPE PAPER 2X10 WHT MICROPORE (GAUZE/BANDAGES/DRESSINGS) ×1 IMPLANT
TOWEL GREEN STERILE (TOWEL DISPOSABLE) ×3 IMPLANT
TOWEL GREEN STERILE FF (TOWEL DISPOSABLE) ×3 IMPLANT
TRAY FOLEY SLVR 16FR TEMP STAT (SET/KITS/TRAYS/PACK) ×3 IMPLANT
TUBE SUCTION CARDIAC 10FR (CANNULA) ×1 IMPLANT
TUBING LAP HI FLOW INSUFFLATIO (TUBING) ×3 IMPLANT
UNDERPAD 30X36 HEAVY ABSORB (UNDERPADS AND DIAPERS) ×3 IMPLANT
VALVE AORTIC SZ23 INSP/RESIL (Prosthesis & Implant Heart) ×1 IMPLANT
VENT LEFT HEART 12002 (CATHETERS) ×3
WATER STERILE IRR 1000ML POUR (IV SOLUTION) ×6 IMPLANT

## 2021-07-29 NOTE — Progress Notes (Signed)
?  Echocardiogram ?Echocardiogram Transesophageal has been performed. ? Antonio Cervantes ?07/29/2021, 9:59 AM ?

## 2021-07-29 NOTE — Anesthesia Procedure Notes (Signed)
Central Venous Catheter Insertion ?Performed by: Myrtie Soman, MD, anesthesiologist ?Start/End4/06/2021 6:35 AM, 07/29/2021 7:01 AM ?Patient location: Pre-op. ?Preanesthetic checklist: patient identified, IV checked, site marked, risks and benefits discussed, surgical consent, monitors and equipment checked, pre-op evaluation, timeout performed and anesthesia consent ?Position: Trendelenburg ?Lidocaine 1% used for infiltration and patient sedated ?Hand hygiene performed  and maximum sterile barriers used  ?Catheter size: 8.5 Fr ?PA cath was placed.Sheath introducer ?PA Cath depth:45 ?Procedure performed using ultrasound guided technique. ?Ultrasound Notes:anatomy identified, needle tip was noted to be adjacent to the nerve/plexus identified, no ultrasound evidence of intravascular and/or intraneural injection and image(s) printed for medical record ?Attempts: 1 ?Following insertion, line sutured, dressing applied and Biopatch. ?Post procedure assessment: blood return through all ports, free fluid flow and no air ? ?Patient tolerated the procedure well with no immediate complications. ? ? ? ?

## 2021-07-29 NOTE — Plan of Care (Signed)
?  Problem: Skin Integrity: ?Goal: Risk for impaired skin integrity will decrease ?Outcome: Completed/Met ?  ?Problem: Safety: ?Goal: Ability to remain free from injury will improve ?Outcome: Completed/Met ?  ?Problem: Pain Managment: ?Goal: General experience of comfort will improve ?Outcome: Completed/Met ?  ?Problem: Nutrition: ?Goal: Adequate nutrition will be maintained ?Outcome: Completed/Met ?  ?

## 2021-07-29 NOTE — Op Note (Signed)
?CARDIOVASCULAR SURGERY OPERATIVE NOTE ? ?07/29/2021 ? ?Surgeon:  Gaye Pollack, MD ? ?First Assistant: Enid Cutter,  PA-C:   An experienced assistant was required given the complexity of this surgery and the standard of surgical care. The assistant was needed for exposure, dissection, suctioning, retraction of delicate tissues and sutures, instrument exchange and for overall help during this procedure. ? ? ?Preoperative Diagnosis:  Severe multi-vessel coronary artery disease, mild aortic stenosis and insufficiency.  ? ? ?Postoperative Diagnosis:  Same ? ? ?Procedure: ? ?Median Sternotomy ?Extracorporeal circulation ?3.   Coronary artery bypass grafting x 4 ? ?Left internal mammary artery graft to the LAD ?SVG to diagonal ?SVG to OM ?SVG to distal RCA ?4.   Endoscopic vein harvest from the right leg ?5.   Aortic valve replacement using a 23 mm Edwards INSPIRIS RESILIA pericardial valve ? ? ?Anesthesia:  General Endotracheal ? ? ?Clinical History/Surgical Indication: ? ?This 66 year old gentleman has severe three-vessel coronary disease with mild left ventricular systolic dysfunction and mild aortic stenosis/insufficiency with a calcified aortic valve presenting with NSTEMI.  His ejection fraction has significantly improved compared to his echo in January after his first MI.  Cardiac MRI shows viable myocardium throughout.  He has diffusely diseased vessels but I think his vessels are graftable and CABG should improve his symptoms and prevent further myocardial loss.  I think he also requires aortic valve replacement since the combination of aortic stenosis and insufficiency will be a continued drag on his heart and will continue to progress.  He has not seen a dentist in years and therefore we will get an orthopantogram today to evaluate his teeth and he may need dental consultation.  He has had a mild bump in his  creatinine to 1.6 today but that should improve.  I will tentatively plan on doing surgery on Monday morning. I discussed the operative procedure with the patient including alternatives, benefits and risks; including but not limited to bleeding, blood transfusion, infection, stroke, myocardial infarction, graft failure, heart block requiring a permanent pacemaker, organ dysfunction, and death.  Antonio Cervantes understands and agrees to proceed.   ? ?Preparation: ? ?The patient was seen in the preoperative holding area and the correct patient, correct operation were confirmed with the patient after reviewing the medical record and catheterization. The consent was signed by me. Preoperative antibiotics were given. A pulmonary arterial line and radial arterial line were placed by the anesthesia team. The patient was taken back to the operating room and positioned supine on the operating room table. After being placed under general endotracheal anesthesia by the anesthesia team a foley catheter was placed. The neck, chest, abdomen, and both legs were prepped with betadine soap and solution and draped in the usual sterile manner. A surgical time-out was taken and the correct patient and operative procedure were confirmed with the nursing and anesthesia staff. ? ? ?Cardiopulmonary Bypass: ? ?A median sternotomy was performed. The pericardium was opened in the midline. Right ventricular function appeared normal. The ascending aorta was of normal size and had no palpable plaque. There were no contraindications to aortic cannulation or cross-clamping. The patient was fully systemically heparinized and the ACT was maintained > 400 sec. The proximal aortic arch was cannulated with a 20 F aortic cannula for arterial inflow. Venous cannulation was performed via the right atrial appendage using a two-staged venous cannula. An antegrade cardioplegia/vent cannula was inserted into the mid-ascending aorta. A left ventricular vent  was placed via the right superior  pulmonary vein. A retrograde cardioplegia cannula was inserted into the coronary sinus via the right atrium. Aortic occlusion was performed with a single cross-clamp. Systemic cooling to 32 degrees Centigrade and topical cooling of the heart with iced saline were used. Antegrade cold KBC cardioplegia was used to induce diastolic arrest and was then given at about 60 minute intervals throughout the period of arrest to maintain myocardial temperature at or below 10 degrees centigrade. A temperature probe was inserted into the interventricular septum and an insulating pad was placed in the pericardium. ? ? ?Left internal mammary artery harvest: ? ?The left side of the sternum was retracted using the Rultract retractor. The left internal mammary artery was harvested as a pedicle graft. All side branches were clipped. It was a medium-sized vessel of good quality with excellent blood flow. It was ligated distally and divided. It was sprayed with topical papaverine solution to prevent vasospasm. ? ? ?Endoscopic vein harvest: ? ?The right greater saphenous vein was harvested endoscopically through a 2 cm incision medial to the right knee. It was harvested from the upper thigh to below the knee. It was a medium-sized vein of good quality. The side branches were all ligated with 4-0 silk ties.  ? ? ?Coronary arteries: ? ?The coronary arteries were examined. ? ?LAD:  intramyocardial proximally and then exited to the surface just before the diffusely diseased distal portion. The vessel was graftable at this location. The diagonal was diffusely diseased but graftable distally.  ?LCX:  OM was intramyocardial. It was located in the mid portion where it was minimally diseased. ?RCA:  Diffusely diseased with calcific plaque but the vessel was suitable for grafting just before the PDA branch which was small caliber. The PL branches were also small caliber vessels. ? ? ?Grafts: ? ?LIMA to the LAD: 2.0  mm. It was sewn end to side using 8-0 prolene continuous suture. ?SVG to diagonal:  1.5 mm distally. It was sewn end to side using 7-0 prolene continuous suture. ?SVG to OM:  1.75 mm. It was sewn end to side using 7-0 prolene continuous suture. ?SVG to distal RCA:  2.5 mm. It was sewn end to side using 7-0 prolene continuous suture. ? ?The proximal vein graft anastomoses were performed to the mid-ascending aorta using continuous 6-0 prolene suture. Graft markers were placed around the proximal anastomoses. ? ?Aortic Valve Replacement:  ? ?A transverse aortotomy was performed 1 cm above the take-off of the right coronary artery. The native valve was trileaflet with calcified leaflets and minimal annular calcification. The ostia of the coronary arteries were in normal position and were not obstructed. The native valve leaflets were excised and the annulus was decalcified with rongeurs. Care was taken to remove all particulate debris. The left ventricle was directly inspected for debris and then irrigated with ice saline solution. The annulus was sized and a size 23 mm Edwards INSPIRIS RESILIA pericardial valve was chosen. The model number was [id number]  and the serial number was [id number].  While the valve was being prepared 2-0 Ethibond pledgeted horizontal mattress sutures were placed around the annulus with the pledgets in a sub-annular position. The sutures were placed through the sewing ring and the valve lowered into place. The sutures were tied using Cor Knots. The valve seated nicely and the coronary ostia were not obstructed. The prosthetic valve leaflets moved normally and there was no sub-valvular obstruction. The aortotomy was closed using 4-0 Prolene suture in 2 layers with felt strips to reinforce the  closure.  ? ?Completion: ? ?The patient was rewarmed to 37 degrees Centigrade. The clamp was removed from the LIMA pedicle and there was rapid warming of the septum and return of ventricular fibrillation. The  crossclamp was removed with a time of 145 minutes. There was spontaneous return of sinus rhythm. The distal and proximal anastomoses were checked for hemostasis. The position of the grafts was satisfactory.

## 2021-07-29 NOTE — Brief Op Note (Signed)
07/22/2021 - 07/29/2021 ? ?12:29 PM ? ?PATIENT:  Antonio Cervantes  66 y.o. male ? ?PRE-OPERATIVE DIAGNOSES:   ?Multi-Vessel Coronary Artery Disease ?Aortic Stenosis ?Aortic Insufficiency ? ?POST-OPERATIVE DIAGNOSIS:   ?Multi-Vessel Coronary Artery Disease ?Aortic Stenosis ?Aortic Insufficiency ? ?PROCEDURES:   ?CORONARY ARTERY BYPASS GRAFTING (CABG) TIMES FOUR, USING LEFT INTERNAL MAMMARY ARTERY AND RIGHT GREATER SAPHENOUS VEIN HARVESTED ENDOSCOPICALLY ? ?LIMA-LAD ?SVG-D1 ?SVG-OM ?SVG-Distal RCA ? ?AORTIC VALVE REPLACEMENT (AVR) USING EDWARDS INSPIRIS AORTIC VALVE SIZE 23MM  ? ?TRANSESOPHAGEAL ECHOCARDIOGRAM  ? ?Vein harvest time: 70 min Vein prep time: 20 min ? ?SURGEON:  Gaye Pollack, MD - Primary ? ?PHYSICIAN ASSISTANT: Justinn Welter ? ?ASSISTANTS: Dineen Kid, RN, RN First Assistant  ? ?ANESTHESIA:   general ? ?EBL:  200mg  ? ?BLOOD ADMINISTERED:none ? ?DRAINS:  Left pleural and mediastinal tubes   ? ?LOCAL MEDICATIONS USED:  NONE ? ?SPECIMEN:  Aortic valve leaflets ? ?DISPOSITION OF SPECIMEN:  PATHOLOGY ? ?COUNTS:  YES ? ?DICTATION: .Dragon Dictation ? ?PLAN OF CARE: Admit to inpatient  ? ?PATIENT DISPOSITION:  ICU - intubated and hemodynamically stable. ?  ?Delay start of Pharmacological VTE agent (>24hrs) due to surgical blood loss or risk of bleeding: yes ? ?

## 2021-07-29 NOTE — Progress Notes (Signed)
Rapid wean started. Pt on rate of 4 and 40%. ?

## 2021-07-29 NOTE — Transfer of Care (Signed)
Immediate Anesthesia Transfer of Care Note ? ?Patient: Antonio Cervantes ? ?Procedure(s) Performed: CORONARY ARTERY BYPASS GRAFTING (CABG) TIMES FOUR, USING LEFT INTERNAL MAMMARY ARTERY AND RIGHT GREATER SAPHENOUS VEIN HARVESTED ENDOSCOPICALLYY. (Chest) ?AORTIC VALVE REPLACEMENT (AVR) USING EDWARDS INSPIRIS AORTIC VALVE SIZE 23MM (Chest) ?TRANSESOPHAGEAL ECHOCARDIOGRAM (TEE) ? ?Patient Location: SICU ? ?Anesthesia Type:General ? ?Level of Consciousness: sedated and Patient remains intubated per anesthesia plan ? ?Airway & Oxygen Therapy: Patient remains intubated per anesthesia plan and Patient placed on Ventilator (see vital sign flow sheet for setting) ? ?Post-op Assessment: Report given to RN and Post -op Vital signs reviewed and stable ? ?Post vital signs: Reviewed and stable ? ?Last Vitals:  ?Vitals Value Taken Time  ?BP 110/77 07/29/21 1422  ?Temp 36.3 ?C 07/29/21 1429  ?Pulse 80 07/29/21 1429  ?Resp 12 07/29/21 1429  ?SpO2 99 % 07/29/21 1429  ?Vitals shown include unvalidated device data. ? ?Last Pain:  ?Vitals:  ? 07/29/21 0523  ?TempSrc: Oral  ?PainSc:   ?   ? ?  ? ?Complications: No notable events documented. ?

## 2021-07-29 NOTE — Procedures (Signed)
Extubation Procedure Note ? ?Patient Details:   ?Name: Antonio Cervantes ?DOB: [birth year] ?MRN: [id number] ?  ?Airway Documentation:  ?  ?Vent end date: 07/29/21 Vent end time: 2215  ? ?Evaluation ? O2 sats: stable throughout ?Complications: No apparent complications ?Patient did tolerate procedure well. ?Bilateral Breath Sounds: Clear, Diminished ?  ?Yes ? ?Graciella Freer ?07/29/2021, 10:17 PM ? ? ?Pt extubated per rapid wean. Pt able to perform NIF of -26 ?and VC of 918ml. Pt had positive cuff leak. Placed on 5L Alpha at this time. ? ?

## 2021-07-29 NOTE — Interval H&P Note (Signed)
History and Physical Interval Note: ? ?07/29/2021 ?7:13 AM ? ?Antonio Cervantes  has presented today for surgery, with the diagnosis of CAD ?AS ?AI.  The various methods of treatment have been discussed with the patient and family. After consideration of risks, benefits and other options for treatment, the patient has consented to  Procedure(s): ?CORONARY ARTERY BYPASS GRAFTING (CABG) (N/A) ?AORTIC VALVE REPLACEMENT (AVR) (N/A) ?TRANSESOPHAGEAL ECHOCARDIOGRAM (TEE) (N/A) as a surgical intervention.  The patient's history has been reviewed, patient examined, no change in status, stable for surgery.  I have reviewed the patient's chart and labs.  Questions were answered to the patient's satisfaction.   ? ? ?Gaye Pollack ? ? ?

## 2021-07-29 NOTE — Progress Notes (Signed)
Pt placed on cpap PS 5/5 40%. ?

## 2021-07-29 NOTE — Anesthesia Preprocedure Evaluation (Signed)
Anesthesia Evaluation  ?Patient identified by MRN, date of birth, ID band ?Patient awake ? ? ? ?Reviewed: ?Allergy & Precautions, NPO status , Patient's Chart, lab work & pertinent test results ? ?Airway ?Mallampati: II ? ?TM Distance: >3 FB ?Neck ROM: Full ? ? ? Dental ?no notable dental hx. ? ?  ?Pulmonary ?neg pulmonary ROS, former smoker,  ?  ?Pulmonary exam normal ?breath sounds clear to auscultation ? ? ? ? ? ? Cardiovascular ?+ CAD and + Past MI  ?+ Valvular Problems/Murmurs AS  ?Rhythm:Regular Rate:Normal ?+ Systolic murmurs ??Left Ventricle: Left ventricular ejection fraction, by estimation, is  ?50%. The left ventricle has low normal function. The left ventricle has no  ?regional wall motion abnormalities. The left ventricular internal cavity  ?size was normal in size. There is  ?mild left ventricular hypertrophy. Left ventricular diastolic parameters  ?are consistent with Grade II diastolic dysfunction (pseudonormalization).  ? ?Right Ventricle: The right ventricular size is normal. No increase in  ?right ventricular wall thickness. Right ventricular systolic function is  ?normal. Tricuspid regurgitation signal is inadequate for assessing PA  ?pressure.  ? ?Left Atrium: Left atrial size was mildly dilated.  ? ?Right Atrium: Right atrial size was normal in size.  ? ?Pericardium: There is no evidence of pericardial effusion.  ? ?Mitral Valve: The mitral valve is normal in structure. Trivial mitral  ?valve regurgitation. No evidence of mitral valve stenosis.  ? ?Tricuspid Valve: The tricuspid valve is normal in structure. Tricuspid  ?valve regurgitation is trivial. No evidence of tricuspid stenosis.  ? ?Aortic Valve: The aortic valve is abnormal. There is moderate  ?calcification of the aortic valve. Aortic valve regurgitation is mild.  ?Aortic regurgitation PHT measures 604 msec. Mild aortic stenosis is  ?present. Aortic valve mean gradient measures 11.0  ?mmHg. Aortic  valve peak gradient measures 21.0 mmHg. Aortic valve area, by  ?VTI measures 1.18 cm?.  ? ?Pulmonic Valve: The pulmonic valve was grossly normal. Pulmonic valve  ?regurgitation is trivial. No evidence of pulmonic stenosis.  ? ?Aorta: The aortic root is normal in size and structure.  ?  ?Neuro/Psych ?negative neurological ROS ? negative psych ROS  ? GI/Hepatic ?negative GI ROS, Neg liver ROS,   ?Endo/Other  ?diabetes ? Renal/GU ?negative Renal ROS  ?negative genitourinary ?  ?Musculoskeletal ?negative musculoskeletal ROS ?(+)  ? Abdominal ?  ?Peds ?negative pediatric ROS ?(+)  Hematology ?negative hematology ROS ?(+)   ?Anesthesia Other Findings ? ? Reproductive/Obstetrics ?negative OB ROS ? ?  ? ? ? ? ? ? ? ? ? ? ? ? ? ?  ?  ? ? ? ? ? ? ? ? ?Anesthesia Physical ?Anesthesia Plan ? ?ASA: 4 ? ?Anesthesia Plan: General  ? ?Post-op Pain Management: Dilaudid IV  ? ?Induction: Intravenous ? ?PONV Risk Score and Plan: 2 and Ondansetron and Treatment may vary due to age or medical condition ? ?Airway Management Planned: Oral ETT ? ?Additional Equipment: Arterial line, CVP, PA Cath, TEE and Ultrasound Guidance Line Placement ? ?Intra-op Plan:  ? ?Post-operative Plan: Post-operative intubation/ventilation ? ?Informed Consent: I have reviewed the patients History and Physical, chart, labs and discussed the procedure including the risks, benefits and alternatives for the proposed anesthesia with the patient or authorized representative who has indicated his/her understanding and acceptance.  ? ? ? ?Dental advisory given ? ?Plan Discussed with: CRNA and Surgeon ? ?Anesthesia Plan Comments:   ? ? ? ? ? ? ?Anesthesia Quick Evaluation ? ?

## 2021-07-29 NOTE — Anesthesia Procedure Notes (Signed)
Arterial Line Insertion ?Start/End4/06/2021 6:55 AM, 07/29/2021 7:00 AM ?Performed by: Lonia Mad, CRNA, CRNA ? Patient location: Pre-op. ?Preanesthetic checklist: patient identified, IV checked, site marked, risks and benefits discussed, surgical consent, monitors and equipment checked, pre-op evaluation, timeout performed and anesthesia consent ?Lidocaine 1% used for infiltration and patient sedated ?Left, radial was placed ?Catheter size: 20 G ?Hand hygiene performed  and maximum sterile barriers used  ? ?Attempts: 1 ?Procedure performed without using ultrasound guided technique. ?Following insertion, dressing applied and Biopatch. ?Post procedure assessment: normal ? ?Patient tolerated the procedure well with no immediate complications. ? ? ?

## 2021-07-29 NOTE — Anesthesia Procedure Notes (Signed)
Anesthesia Procedure Image    

## 2021-07-29 NOTE — Anesthesia Procedure Notes (Signed)
Procedure Name: Intubation ?Date/Time: 07/29/2021 7:40 AM ?Performed by: Betha Loa, CRNA ?Pre-anesthesia Checklist: Patient identified, Emergency Drugs available, Suction available and Patient being monitored ?Patient Re-evaluated:Patient Re-evaluated prior to induction ?Oxygen Delivery Method: Circle System Utilized ?Preoxygenation: Pre-oxygenation with 100% oxygen ?Induction Type: IV induction ?Ventilation: Mask ventilation without difficulty and Oral airway inserted - appropriate to patient size ?Laryngoscope Size: Mac and 4 ?Grade View: Grade I ?Tube type: Oral ?Tube size: 8.0 mm ?Number of attempts: 1 ?Airway Equipment and Method: Stylet ?Placement Confirmation: ETT inserted through vocal cords under direct vision, positive ETCO2 and breath sounds checked- equal and bilateral ?Secured at: 22 cm ?Tube secured with: Tape ?Dental Injury: Teeth and Oropharynx as per pre-operative assessment  ?Difficulty Due To: Difficult Airway- due to dentition ?Comments: Pt with poor dentition and full beard. ? ? ? ? ?

## 2021-07-29 NOTE — Progress Notes (Signed)
? ?   ?  [address] ?      [place] [address] ?            [phone]   ? ?  ?S/p AVR, CABG x 4 ? ?Intubated, sedated ? ?BP 107/66 (BP Location: Right Arm)   Pulse 80   Temp (!) 97.2 ?F (36.2 ?C)   Resp 12   Ht 5\' 3"  (1.6 m)   Wt 69.3 kg   SpO2 100%   BMI 27.05 kg/m?  ? ?26/16 Ci 1.6- improved after volume ? ? ?Intake/Output Summary (Last 24 hours) at 07/29/2021 1821 ?Last data filed at 07/29/2021 1600 ?Gross per 24 hour  ?Intake 3475.39 ml  ?Output 3317 ml  ?Net 158.39 ml  ? ?Hct 27 ? ?Stable early postop ? ?09/28/2021 Salvatore Decent, MD ?[facility] ?[phone] ? ?

## 2021-07-30 ENCOUNTER — Encounter (HOSPITAL_COMMUNITY): Payer: Self-pay | Admitting: Surgery

## 2021-07-30 ENCOUNTER — Inpatient Hospital Stay (HOSPITAL_COMMUNITY): Payer: Medicare Other

## 2021-07-30 DIAGNOSIS — Z952 Presence of prosthetic heart valve: Secondary | ICD-10-CM

## 2021-07-30 LAB — CBC
HCT: 22.7 % — ABNORMAL LOW (ref 39.0–52.0)
HCT: 25.3 % — ABNORMAL LOW (ref 39.0–52.0)
Hemoglobin: 7.5 g/dL — ABNORMAL LOW (ref 13.0–17.0)
Hemoglobin: 8.4 g/dL — ABNORMAL LOW (ref 13.0–17.0)
MCH: 30.6 pg (ref 26.0–34.0)
MCH: 30.5 pg (ref 26.0–34.0)
MCHC: 33 g/dL (ref 30.0–36.0)
MCHC: 33.2 g/dL (ref 30.0–36.0)
MCV: 92.7 fL (ref 80.0–100.0)
MCV: 92 fL (ref 80.0–100.0)
Platelets: 135 10*3/uL — ABNORMAL LOW (ref 150–400)
Platelets: 123 10*3/uL — ABNORMAL LOW (ref 150–400)
RBC: 2.45 MIL/uL — ABNORMAL LOW (ref 4.22–5.81)
RBC: 2.75 MIL/uL — ABNORMAL LOW (ref 4.22–5.81)
RDW: 14.8 % (ref 11.5–15.5)
RDW: 14.6 % (ref 11.5–15.5)
WBC: 15 10*3/uL — ABNORMAL HIGH (ref 4.0–10.5)
WBC: 14.4 10*3/uL — ABNORMAL HIGH (ref 4.0–10.5)
nRBC: 0 % (ref 0.0–0.2)
nRBC: 0 % (ref 0.0–0.2)

## 2021-07-30 LAB — BASIC METABOLIC PANEL
Anion gap: 9 (ref 5–15)
Anion gap: 10 (ref 5–15)
BUN: 17 mg/dL (ref 8–23)
BUN: 18 mg/dL (ref 8–23)
CO2: 20 mmol/L — ABNORMAL LOW (ref 22–32)
CO2: 20 mmol/L — ABNORMAL LOW (ref 22–32)
Calcium: 8.4 mg/dL — ABNORMAL LOW (ref 8.9–10.3)
Calcium: 8.1 mg/dL — ABNORMAL LOW (ref 8.9–10.3)
Chloride: 109 mmol/L (ref 98–111)
Chloride: 105 mmol/L (ref 98–111)
Creatinine, Ser: 1.27 mg/dL — ABNORMAL HIGH (ref 0.61–1.24)
Creatinine, Ser: 1.26 mg/dL — ABNORMAL HIGH (ref 0.61–1.24)
GFR, Estimated: >60 mL/min (ref >60)
GFR, Estimated: >60 mL/min (ref >60)
Glucose, Bld: 109 mg/dL — ABNORMAL HIGH (ref 70–99)
Glucose, Bld: 148 mg/dL — ABNORMAL HIGH (ref 70–99)
Potassium: 5 mmol/L (ref 3.5–5.1)
Potassium: 4.6 mmol/L (ref 3.5–5.1)
Sodium: 138 mmol/L (ref 135–145)
Sodium: 135 mmol/L (ref 135–145)

## 2021-07-30 LAB — SURGICAL PATHOLOGY

## 2021-07-30 LAB — GLUCOSE, CAPILLARY
Glucose-Capillary: 106 mg/dL — ABNORMAL HIGH (ref 70–99)
Glucose-Capillary: 106 mg/dL — ABNORMAL HIGH (ref 70–99)
Glucose-Capillary: 113 mg/dL — ABNORMAL HIGH (ref 70–99)
Glucose-Capillary: 121 mg/dL — ABNORMAL HIGH (ref 70–99)
Glucose-Capillary: 121 mg/dL — ABNORMAL HIGH (ref 70–99)
Glucose-Capillary: 125 mg/dL — ABNORMAL HIGH (ref 70–99)
Glucose-Capillary: 128 mg/dL — ABNORMAL HIGH (ref 70–99)
Glucose-Capillary: 144 mg/dL — ABNORMAL HIGH (ref 70–99)
Glucose-Capillary: 145 mg/dL — ABNORMAL HIGH (ref 70–99)
Glucose-Capillary: 147 mg/dL — ABNORMAL HIGH (ref 70–99)
Glucose-Capillary: 91 mg/dL (ref 70–99)
Glucose-Capillary: 99 mg/dL (ref 70–99)

## 2021-07-30 LAB — ECHO INTRAOPERATIVE TEE
AR max vel: 1.09 cm2
AV Area VTI: 0.97 cm2
AV Area mean vel: 0.96 cm2
AV Mean grad: 12 mmHg
AV Peak grad: 23 mmHg
Ao pk vel: 2.4 m/s
Height: 63 in
P 1/2 time: 624 msec
Single Plane A2C EF: 45.3 %
Weight: 2443.2 oz

## 2021-07-30 LAB — MAGNESIUM
Magnesium: 2.7 mg/dL — ABNORMAL HIGH (ref 1.7–2.4)
Magnesium: 2.4 mg/dL (ref 1.7–2.4)

## 2021-07-30 LAB — PREPARE RBC (CROSSMATCH)

## 2021-07-30 MED ORDER — SODIUM CHLORIDE 0.9% IV SOLUTION: Freq: Once | INTRAVENOUS | Status: DC

## 2021-07-30 MED ORDER — FE FUMARATE-B12-VIT C-FA-IFC PO CAPS
1.0000 | ORAL_CAPSULE | Freq: Two times a day (BID) | ORAL | Status: DC
  Administered 2021-07-30 – 2021-08-05 (×13): 1 via ORAL
  Filled 2021-07-30 (×13): qty 1

## 2021-07-30 MED ORDER — ENOXAPARIN SODIUM 40 MG/0.4ML IJ SOSY
40.0000 mg | PREFILLED_SYRINGE | Freq: Every day | INTRAMUSCULAR | Status: DC
  Administered 2021-07-30 – 2021-08-01 (×3): 40 mg via SUBCUTANEOUS
  Filled 2021-07-30 (×3): qty 0.4

## 2021-07-30 MED ORDER — DOPAMINE-DEXTROSE 3.2-5 MG/ML-% IV SOLN
1.0000 ug/kg/min | INTRAVENOUS | Status: DC
  Administered 2021-07-30 (×2): 2 ug/kg/min via INTRAVENOUS

## 2021-07-30 MED ORDER — ALLOPURINOL 100 MG PO TABS
100.0000 mg | ORAL_TABLET | Freq: Every day | ORAL | Status: DC
  Administered 2021-07-30 – 2021-08-05 (×7): 100 mg via ORAL
  Filled 2021-07-30 (×7): qty 1

## 2021-07-30 MED ORDER — INSULIN DETEMIR 100 UNIT/ML ~~LOC~~ SOLN
15.0000 [IU] | Freq: Every day | SUBCUTANEOUS | Status: DC
  Administered 2021-07-30 – 2021-08-01 (×3): 15 [IU] via SUBCUTANEOUS
  Filled 2021-07-30 (×4): qty 0.15

## 2021-07-30 MED ORDER — ORAL CARE MOUTH RINSE
15.0000 mL | Freq: Two times a day (BID) | OROMUCOSAL | Status: DC
  Administered 2021-07-30 – 2021-08-05 (×10): 15 mL via OROMUCOSAL

## 2021-07-30 MED ORDER — INSULIN DETEMIR 100 UNIT/ML ~~LOC~~ SOLN
15.0000 [IU] | Freq: Every day | SUBCUTANEOUS | Status: DC
  Filled 2021-07-30: qty 0.15

## 2021-07-30 MED ORDER — EZETIMIBE 10 MG PO TABS
10.0000 mg | ORAL_TABLET | Freq: Every day | ORAL | Status: DC
  Administered 2021-07-31 – 2021-08-05 (×6): 10 mg via ORAL
  Filled 2021-07-30 (×6): qty 1

## 2021-07-30 MED ORDER — INSULIN ASPART 100 UNIT/ML IJ SOLN
0.0000 [IU] | INTRAMUSCULAR | Status: DC
  Administered 2021-07-30 – 2021-07-31 (×4): 2 [IU] via SUBCUTANEOUS

## 2021-07-30 MED FILL — Thrombin (Recombinant) For Soln 20000 Unit: CUTANEOUS | Qty: 1 | Status: AC

## 2021-07-30 NOTE — Progress Notes (Signed)
1 Day Post-Op Procedure(s) (LRB): ?CORONARY ARTERY BYPASS GRAFTING (CABG) TIMES FOUR, USING LEFT INTERNAL MAMMARY ARTERY AND RIGHT GREATER SAPHENOUS VEIN HARVESTED ENDOSCOPICALLYY. (N/A) ?AORTIC VALVE REPLACEMENT (AVR) USING EDWARDS INSPIRIS AORTIC VALVE SIZE (N/A) ?TRANSESOPHAGEAL ECHOCARDIOGRAM (TEE) (N/A) ?Subjective: ?No complaints ? ?Objective: ?Vital signs in last 24 hours: ?Temp:  [97.2 ?F (36.2 ?C)-99.1 ?F (37.3 ?C)] 99.1 ?F (37.3 ?C) (04/04 0645) ?Pulse Rate:  [56-90] 79 (04/04 0645) ?Cardiac Rhythm: A-V Sequential paced (04/03 2000) ?Resp:  [7-22] 18 (04/04 0645) ?BP: (91-128)/(55-83) 111/66 (04/04 0630) ?SpO2:  [86 %-100 %] 100 % (04/04 0645) ?Arterial Line BP: (83-151)/(38-75) 116/49 (04/04 0645) ?FiO2 (%):  [40 %-50 %] 40 % (04/03 2144) ?Weight:  [75.5 kg] 75.5 kg (04/04 0500) ? ?Hemodynamic parameters for last 24 hours: ?PAP: (8-30)/(-9-23) 25/12 ?CO:  [2.4 L/min-4.1 L/min] 3.9 L/min ?CI:  [1.4 L/min/m2-2.4 L/min/m2] 2.3 L/min/m2 ? ?Intake/Output from previous day: ?04/03 0701 - 04/04 0700 ?In: 4336.6 [I.V.:2400.5; Blood:170; IV Piggyback:1766.1] ?Out: 3837 [Urine:3030; Blood:527; Chest Tube:280] ?Intake/Output this shift: ?Total I/O ?In: 1048.3 [I.V.:277.3; IV Piggyback:771] ?Out: 1270 [Urine:1120; Chest Tube:150] ? ?General appearance: alert and cooperative ?Neurologic: intact ?Heart: regular rate and rhythm, S1, S2 normal, no murmur ?Lungs: clear to auscultation bilaterally ?Extremities: edema mild ?Wound: dressing dry ? ?Lab Results: ?Recent Labs  ?  07/29/21 ?2049 07/29/21 ?2205 07/30/21 ?0411  ?WBC 12.7*  --  15.0*  ?HGB 8.0* 7.5* 7.5*  ?HCT 23.5* 22.0* 22.7*  ?PLT 138*  --  135*  ? ?BMET:  ?Recent Labs  ?  07/29/21 ?2049 07/29/21 ?2205 07/30/21 ?0411  ?NA 136 137 138  ?K 5.1 5.1 5.0  ?CL 108  --  109  ?CO2 20*  --  20*  ?GLUCOSE 122*  --  109*  ?BUN 16  --  17  ?CREATININE 1.21  --  1.27*  ?CALCIUM 8.1*  --  8.4*  ?  ?PT/INR:  ?Recent Labs  ?  07/29/21 ?1434  ?LABPROT 19.1*  ?INR 1.6*   ? ?ABG ?   ?Component Value Date/Time  ? PHART 7.357 07/29/2021 2205  ? HCO3 21.9 07/29/2021 2205  ? TCO2 23 07/29/2021 2205  ? ACIDBASEDEF 3.0 (H) 07/29/2021 2205  ? O2SAT 99 07/29/2021 2205  ? ?CBG (last 3)  ?Recent Labs  ?  07/30/21 ?0410 07/30/21 ?8657 07/30/21 ?8469  ?GLUCAP 113* 106* 121*  ? ?CXR: clear ? ?ECG: sinus brady 55, no acute changes ? ?Assessment/Plan: ?S/P Procedure(s) (LRB): ?CORONARY ARTERY BYPASS GRAFTING (CABG) TIMES FOUR, USING LEFT INTERNAL MAMMARY ARTERY AND RIGHT GREATER SAPHENOUS VEIN HARVESTED ENDOSCOPICALLYY. (N/A) ?AORTIC VALVE REPLACEMENT (AVR) USING EDWARDS INSPIRIS AORTIC VALVE SIZE (N/A) ?TRANSESOPHAGEAL ECHOCARDIOGRAM (TEE) (N/A) ? ?POD 1 ?Hemodynamically stable but still on neo 18 mcg. Dopamine 2 for renal perfusion with creat 1.4-1.5 preop.  Rhythm is sinus brady 50's. Hold Lopressor for now. ? ?Expected acute postop blood loss anemia: Hgb trended down to 7.5 this am and since still on neo will transfuse a unit of PRBC's and start iron. No blood loss postop. This is dilutional.  ? ?DC chest tubes, swan, arterial line. ? ?DM: glucose under good control on insulin drip. Preop Hgb A1C 8.4. Transition to Levemir and SSI. Can resume [place] at discharge. ? ?Volume excess: Wt is 13 lbs over preop. Start diuresis once stable off vasopressors. ? ?IS, OOB. ? ?He was on Eliquis on admission for hx of atrial fib with RVR after admission after MI in Jan. Will need to decide about resuming at discharge. He did go  into atrial fib in OR pre-bypass. ? ? LOS: 7 days  ? ? ?Antonio Cervantes ?07/30/2021 ? ? ?

## 2021-07-30 NOTE — Discharge Summary (Signed)
Physician Discharge Summary  ?Patient ID: ?Antonio Cervantes ?MRN: [id number] ?DOB/AGE: 09-17-1955 66 y.o. ? ?Admit date: 07/22/2021 ?Discharge date: 08/05/2021 ? ?Admission Diagnoses: ? ?Aortic stenosis ?Multivessel coronary artery disease ?Heart failure with reduced ejection fraction ?History of non-ST elevation myocardial infarction ?Ischemic cardiomyopathy ?Type 2 diabetes mellitus with diabetic peripheral neuropathy ?Paroxysmal Atrial fibrillation ?History of hypothyroidism ?Dyslipidemia ? ?Discharge Diagnoses:  ? ?Aortic stenosis ?Multivessel coronary artery disease ?Heart failure with reduced ejection fraction ?History of non-ST elevation myocardial infarction ?Ischemic cardiomyopathy ?Type 2 diabetes mellitus with diabetic peripheral neuropathy ?Atrial fibrillation ?Dyslipidemia ?Status post aortic valve replacement ?Status post CABG x4 ?Expected acute blood loss anemia ?Acute on chronic renal insufficiency ? ?Discharged Condition: stable ? ?History of Present Illness: ? ?The patient is a 66 year old gentleman with history of diabetes, hypertension, hyperlipidemia, hypothyroidism, remote tobacco abuse, and coronary artery disease who presented in January 2023 with a NSTEMI.  His troponin peaked at 8787.  Echocardiogram at that time showed an ejection fraction of 20 to 25% with multiple wall motion abnormalities suggesting an LAD infarct.  The aortic valve was moderately calcified and thickened with restricted mobility.  The mean gradient was 10 mmHg with a peak gradient of 17.5 mmHg.  There was moderate aortic insufficiency with pressure half-time of 358 ms.  Cardiac catheterization showed severe three-vessel coronary disease.  The LAD was diffusely diseased with 75% proximal to mid stenosis.  The distal LAD had 80% stenosis.  There is a moderate size first diagonal that had 80% stenosis.  The RCA had 99% ostial to mid stenosis.  Left circumflex had 99% second marginal stenosis and 90% distal stenosis.  LVEDP  and PA pressures were normal.  It was felt by cardiology that his vessels were not suitable for PCI and also not felt to be ideal for surgical revascularization.  The patient recovered and was discharged home and said that he has been doing well and remaining active until Sunday when he developed substernal chest tightness and felt like something standing on his chest.  The symptoms were different than his prior MI where he had some chest discomfort but also severe shortness of breath.  His troponin on presentation was 473 and peaked at 1610.  He continued to have some chest heaviness after admission.  Cardiac MRI on 07/23/2021 showed an ejection fraction of 47% with inferior and inferoseptal as well as apical lateral hypokinesis.  It suggested viable myocardium.  The aortic valve is trileaflet with a valve area measured at 1.38 cm? by planimetry with a mean gradient of 3 mmHg.  Regurgitant fraction was 22%.  A repeat echo yesterday showed an ejection fraction of 50% with a mean aortic valve gradient of 11 mmHg.  AI pressure half-time of 604 ms. ?  ?He tells me that he has had no further chest discomfort or shortness of breath.  He has remained active at home up until he had the symptoms Sunday.  He has been mowing grass and doing things around his house.  His energy level has been good.  He lives at home with his 67 year old mother and takes care of her.  He has 1 adult son.  He is a remote smoker.  He has not seen a dentist in many years and said that he thinks he has some bad teeth. An orthopantogram was performed showing no acute abscesses or acute processes.  ? ?Hospital Course: ?Mr. Antonio Cervantes remained stable following left heart catheterization.  He was taken to the operative room 07/29/2021.  He  had an episode of atrial fibrillation prior to beginning surgery.  CABG x4 and aortic valve replacement were carried out without complication.  The left internal mammary artery was grafted to the left anterior descending  coronary artery.  Separate saphenous vein grafts were placed to the diagonal, obtuse marginal, and distal right coronary arteries.  The aortic valve was replaced with a 23 mm Edwards Inspiris Resilia pericardial tissue valve.  Following the procedure, he separated from cardiopulmonary bypass without difficulty and on low-dose dopamine.  He was transferred to the ICU in stable condition.  Vital signs, hemodynamics, and respiratory status remained stable.  He was weaned from the ventilator per protocol and extubated by 2:30 PM on the day of surgery.  On postop day 1, he remained hemodynamically stable on low-dose dopamine and Neo-Synephrine.  He was in sinus bradycardia with a heart rate in the 50s.  PA catheter, chest tubes, and arterial line were removed on the first postoperative day.  Glucose was monitored and was managed with an insulin drip early postoperatively.  This was transitioned to Levemir and sliding scale insulin on the first postoperative day.  Diuresis was initiated once he was off of vasopressor support on postop day 2.  By the following day, his creatinine had increased with a peak of 1.48.  Further diuretic was withheld.  He developed atrial fibrillation with controlled ventricular rate.  He was started on an IV amiodarone bolus which was discontinued when he dropped his heart rate into the 30s.  Epicardial pacing leads were utilized to paced him at a higher rate.  On postop day 3, he had further decline in his hematocrit to 23 so he was transfused with another unit of packed red blood cells.  He developed some nausea and vomiting but his abdominal exam was benign.  KUB x-ray showed nonspecific gas pattern without evidence of obstruction and with stool throughout the colon.  Due to persistent nausea Amiodarone and Lopressor were discontinued.  He was started on midodrine for hypotension.  Due to persistent atrial Fibrillation he was started on coumadin for stroke prophylaxis.  This would need to be  continued for at least 3 months with his new bioprosthetic valve in place, then it could transition back to Eliquis.  He was felt to be medically stable for transfer to the progressive care unit on 08/02/2021.  He converted to NSR.  He developed hypertension and midodrine was discontinued.  His pacing wires were removed without difficulty.  His most recent INR is 2.3 and he will be discharged home on 1mg  daily.  He is ambulating independently.  His surgical incisions are healing without evidence of infection.  He is medically table for discharge home today. ? ? ?Consults: cardiology ? ?Significant Diagnostic Studies: angiography:  ? ?  ?  Prox LAD to Mid LAD lesion is 75% stenosed. ?  Dist LAD lesion is 80% stenosed. ?  1st Diag lesion is 80% stenosed. ?  Ost RCA to Mid RCA lesion is 99% stenosed. ?  2nd Mrg lesion is 99% stenosed. ?  Dist Cx lesion is 90% stenosed. ?  Prox Cx to Mid Cx lesion is 75% stenosed. ?  LV end diastolic pressure is normal. ?  There is no aortic valve stenosis. ?  ?Severe, multivessel coronary artery disease.  Normal LVEDP. ?  ?Given diffuse, distal vessel disease, there are no good options for revascularization.  Plan for medical therapy to try to improve his LV function.  Probable heart failure consultation as  well. ? ?ECHO: ? ?IMPRESSIONS  ? ? ? 1. Septal apical mid/distal anterior wall and inferior apex akinetic  ?suggesting LAD infarct. Left ventricular ejection fraction, by estimation,  ?is 20 to 25%. The left ventricle has severely decreased function. The left  ?ventricle demonstrates regional  ?wall motion abnormalities (see scoring diagram/findings for description).  ?The left ventricular internal cavity size was severely dilated. There is  ?mild left ventricular hypertrophy. Left ventricular diastolic parameters  ?were normal.  ? 2. Right ventricular systolic function is normal. The right ventricular  ?size is normal. There is normal pulmonary artery systolic pressure.  ? 3. The  mitral valve is normal in structure. Mild mitral valve  ?regurgitation. No evidence of mitral stenosis.  ? 4. Degree of AS hard to judge due to severe decrease in LV function DVI  ?0.55 AVA 2 cm2 but morp

## 2021-07-30 NOTE — Discharge Instructions (Addendum)
Discharge Instructions: ? ?1. You may shower, please wash incisions daily with soap and water and keep dry.  If you wish to cover wounds with dressing you may do so but please keep clean and change daily.  No tub baths or swimming until incisions have completely healed.  If your incisions become red or develop any drainage please call our office at [phone] ? ?2. No Driving until cleared by Dr. Sharee Pimple office and you are no longer using narcotic pain medications ? ?3. Monitor your weight daily.. Please use the same scale and weigh at same time... If you gain 5-10 lbs in 48 hours with associated lower extremity swelling, please contact our office at [phone] ? ?4. Fever of 101.5 for at least 24 hours with no source, please contact our office at [phone] ? ?5. Activity- up as tolerated, please walk at least 3 times per day.  Avoid strenuous activity, no lifting, pushing, or pulling with your arms over 8-10 lbs for a minimum of 6 weeks ? ?6. If any questions or concerns arise, please do not hesitate to contact our office at [phone] ? ?------------------------------------------------ ?Information on my medicine - Coumadin?   (Warfarin) ? ?Why was Coumadin prescribed for you? ?Coumadin was prescribed for you because you have a blood clot or a medical condition that can cause an increased risk of forming blood clots. Blood clots can cause serious health problems by blocking the flow of blood to the heart, lung, or brain. Coumadin can prevent harmful blood clots from forming. ?As a reminder your indication for Coumadin is:  Stroke Prevention because of Atrial Fibrillation ? ?What test will check on my response to Coumadin? ?While on Coumadin (warfarin) you will need to have an INR test regularly to ensure that your dose is keeping you in the desired range. The INR (international normalized ratio) number is calculated from the result of the laboratory test called prothrombin time (PT). ? ?If an INR  APPOINTMENT HAS NOT ALREADY BEEN MADE FOR YOU please schedule an appointment to have this lab work done by your health care provider within 7 days. ?Your INR goal is usually a number between:  2 to 3 or your provider may give you a more narrow range like 2-2.5.  Ask your health care provider during an office visit what your goal INR is. ? ?What  do you need to  know  About  COUMADIN? ?Take Coumadin (warfarin) exactly as prescribed by your healthcare provider about the same time each day.  DO NOT stop taking without talking to the doctor who prescribed the medication.  Stopping without other blood clot prevention medication to take the place of Coumadin may increase your risk of developing a new clot or stroke.  Get refills before you run out. ? ?What do you do if you miss a dose? ?If you miss a dose, take it as soon as you remember on the same day then continue your regularly scheduled regimen the next day.  Do not take two doses of Coumadin at the same time. ? ?Important Safety Information ?A possible side effect of Coumadin (Warfarin) is an increased risk of bleeding. You should call your healthcare provider right away if you experience any of the following: ?Bleeding from an injury or your nose that does not stop. ?Unusual colored urine (red or dark brown) or unusual colored stools (red or black). ?Unusual bruising for unknown reasons. ?A serious fall or if you hit your head (even if there is no bleeding). ? ?  Some foods or medicines interact with Coumadin? (warfarin) and might alter your response to warfarin. To help avoid this: ?Eat a balanced diet, maintaining a consistent amount of Vitamin K. ?Notify your provider about major diet changes you plan to make. ?Avoid alcohol or limit your intake to 1 drink for women and 2 drinks for men per day. ?(1 drink is 5 oz. wine, 12 oz. beer, or 1.5 oz. liquor.) ? ?Make sure that ANY health care provider who prescribes medication for you knows that you are taking Coumadin  (warfarin).  Also make sure the healthcare provider who is monitoring your Coumadin knows when you have started a new medication including herbals and non-prescription products. ? ?Coumadin? (Warfarin)  Major Drug Interactions  ?Increased Warfarin Effect Decreased Warfarin Effect  ?Alcohol (large quantities) ?Antibiotics (esp. Septra/Bactrim, Flagyl, Cipro) ?Amiodarone (Cordarone) ?Aspirin (ASA) ?Cimetidine (Tagamet) ?Megestrol (Megace) ?NSAIDs (ibuprofen, naproxen, etc.) ?Piroxicam (Feldene) ?Propafenone (Rythmol SR) ?Propranolol (Inderal) ?Isoniazid (INH) ?Posaconazole (Noxafil) Barbiturates (Phenobarbital) ?Carbamazepine (Tegretol) ?Chlordiazepoxide (Librium) ?Cholestyramine (Questran) ?Griseofulvin ?Oral Contraceptives ?Rifampin ?Sucralfate (Carafate) ?Vitamin K  ? ?Coumadin? (Warfarin) Major Herbal Interactions  ?Increased Warfarin Effect Decreased Warfarin Effect  ?Garlic ?Ginseng ?Ginkgo biloba Coenzyme Q10 ?Green tea ?St. John?s wort   ? ?Coumadin? (Warfarin) FOOD Interactions  ?Eat a consistent number of servings per week of foods HIGH in Vitamin K ?(1 serving = ? cup)  ?Collards (cooked, or boiled & drained) ?Kale (cooked, or boiled & drained) ?Mustard greens (cooked, or boiled & drained) ?Parsley *serving size only = ? cup ?Spinach (cooked, or boiled & drained) ?Swiss chard (cooked, or boiled & drained) ?Turnip greens (cooked, or boiled & drained)  ?Eat a consistent number of servings per week of foods MEDIUM-HIGH in Vitamin K ?(1 serving = 1 cup)  ?Asparagus (cooked, or boiled & drained) ?Broccoli (cooked, boiled & drained, or raw & chopped) ?Brussel sprouts (cooked, or boiled & drained) *serving size only = ? cup ?Lettuce, raw (green leaf, endive, romaine) ?Spinach, raw ?Turnip greens, raw & chopped  ? ?These websites have more information on Coumadin (warfarin):  http://www.king-[facility].com/; ?www.http://hill-[facility].org/; ? ? ?

## 2021-07-30 NOTE — Hospital Course (Addendum)
History of Present Illness: ? ?The patient is a 66 year old gentleman with history of diabetes, hypertension, hyperlipidemia, hypothyroidism, remote tobacco abuse, and coronary artery disease who presented in January 2023 with a NSTEMI.  His troponin peaked at 8787.  Echocardiogram at that time showed an ejection fraction of 20 to 25% with multiple wall motion abnormalities suggesting an LAD infarct.  The aortic valve was moderately calcified and thickened with restricted mobility.  The mean gradient was 10 mmHg with a peak gradient of 17.5 mmHg.  There was moderate aortic insufficiency with pressure half-time of 358 ms.  Cardiac catheterization showed severe three-vessel coronary disease.  The LAD was diffusely diseased with 75% proximal to mid stenosis.  The distal LAD had 80% stenosis.  There is a moderate size first diagonal that had 80% stenosis.  The RCA had 99% ostial to mid stenosis.  Left circumflex had 99% second marginal stenosis and 90% distal stenosis.  LVEDP and PA pressures were normal.  It was felt by cardiology that his vessels were not suitable for PCI and also not felt to be ideal for surgical revascularization.  The patient recovered and was discharged home and said that he has been doing well and remaining active until Sunday when he developed substernal chest tightness and felt like something standing on his chest.  The symptoms were different than his prior MI where he had some chest discomfort but also severe shortness of breath.  His troponin on presentation was 473 and peaked at 1610.  He continued to have some chest heaviness after admission.  Cardiac MRI on 07/23/2021 showed an ejection fraction of 47% with inferior and inferoseptal as well as apical lateral hypokinesis.  It suggested viable myocardium.  The aortic valve is trileaflet with a valve area measured at 1.38 cm? by planimetry with a mean gradient of 3 mmHg.  Regurgitant fraction was 22%.  A repeat echo yesterday showed an  ejection fraction of 50% with a mean aortic valve gradient of 11 mmHg.  AI pressure half-time of 604 ms. ?  ?He tells me that he has had no further chest discomfort or shortness of breath.  He has remained active at home up until he had the symptoms Sunday.  He has been mowing grass and doing things around his house.  His energy level has been good.  He lives at home with his 57 year old mother and takes care of her.  He has 1 adult son.  He is a remote smoker.  He has not seen a dentist in many years and said that he thinks he has some bad teeth. An orthopantogram was performed showing no acute abscesses or acute processes.  ? ?Hospital Course: ?Mr. Gerilyn Nestle remained stable following left heart catheterization.  He was taken to the operative room 07/29/2021.  He had an episode of atrial fibrillation prior to beginning surgery.  CABG x4 and aortic valve replacement were carried out without complication.  The left internal mammary artery was grafted to the left anterior descending coronary artery.  Separate saphenous vein grafts were placed to the diagonal, obtuse marginal, and distal right coronary arteries.  The aortic valve was replaced with a 23 mm Edwards Inspiris Resilia pericardial tissue valve.  Following the procedure, he separated from cardiopulmonary bypass without difficulty and on low-dose dopamine.  He was transferred to the ICU in stable condition.  Vital signs, hemodynamics, and respiratory status remained stable.  He was weaned from the ventilator per protocol and extubated by 2:30 PM on the day  of surgery.  On postop day 1, he remained hemodynamically stable on low-dose dopamine and Neo-Synephrine.  He was in sinus bradycardia with a heart rate in the 50s.  PA catheter, chest tubes, and arterial line were removed on the first postoperative day.  Glucose was monitored and was managed with an insulin drip early postoperatively.  This was transitioned to Levemir and sliding scale insulin on the first  postoperative day.  Diuresis was initiated once he was off of vasopressor support on postop day 2.  By the following day, his creatinine had increased with a peak of ***.  Further diuretic was withheld.  He developed atrial fibrillation with controlled ventricular rate.  He was started on an IV amiodarone bolus which was discontinued when he dropped his heart rate into the 30s.  Epicardial pacing leads were utilized to paced him at a higher rate.  On postop day 3, he had further decline in his hematocrit to 23 so he was transfused with another unit of packed red blood cells.  He developed some nausea and vomiting but his abdominal exam was benign.  KUB x-ray showed nonspecific gas pattern without evidence of obstruction and with stool throughout the colon.  Due to persistent nausea Amiodarone and Lopressor were discontinued.  He was started on midodrine for hypotension.  Due to persistent atrial Fibrillation he was started on coumadin for stroke prophylaxis.  This would need to be continued for at least 3 months with his new bioprosthetic valve in place, then it was fel the could transition back to Eliquis.  He was felt to be medically stable for transfer to the progressive care unit on 08/02/2021.  He converted to NSR.  He developed hypertension and midodrine was discontinued.  His pacing wires were removed without difficulty.  His most recent INR is *** and he will be discharged home on *** mg daily.  He is ambulating independently.  His surgical incisions are healing without evidence of infection.  He is medically table for discharge home today. ?

## 2021-07-30 NOTE — Anesthesia Postprocedure Evaluation (Signed)
Anesthesia Post Note ? ?Patient: JEBADIAH IMPERATO ? ?Procedure(s) Performed: CORONARY ARTERY BYPASS GRAFTING (CABG) TIMES FOUR, USING LEFT INTERNAL MAMMARY ARTERY AND RIGHT GREATER SAPHENOUS VEIN HARVESTED ENDOSCOPICALLYY. (Chest) ?AORTIC VALVE REPLACEMENT (AVR) USING EDWARDS INSPIRIS AORTIC VALVE SIZE (Chest) ?TRANSESOPHAGEAL ECHOCARDIOGRAM (TEE) ? ?  ? ?Patient location during evaluation: SICU ?Anesthesia Type: General ?Level of consciousness: sedated ?Pain management: pain level controlled ?Vital Signs Assessment: post-procedure vital signs reviewed and stable ?Respiratory status: patient remains intubated per anesthesia plan ?Cardiovascular status: stable ?Postop Assessment: no apparent nausea or vomiting ?Anesthetic complications: no ? ? ?No notable events documented. ? ?Last Vitals:  ?Vitals:  ? 07/30/21 1300 07/30/21 1315  ?BP: 114/72 110/66  ?Pulse: 81 80  ?Resp: 16 19  ?Temp: 37.2 ?C 37.1 ?C  ?SpO2: 100% 99%  ?  ?Last Pain:  ?Vitals:  ? 07/30/21 1244  ?TempSrc: Oral  ?PainSc:   ? ? ?  ?  ?  ?  ?  ?  ? ?Akeya Ryther S ? ? ? ? ?

## 2021-07-31 ENCOUNTER — Inpatient Hospital Stay (HOSPITAL_COMMUNITY): Payer: Medicare Other

## 2021-07-31 DIAGNOSIS — Z951 Presence of aortocoronary bypass graft: Secondary | ICD-10-CM

## 2021-07-31 LAB — CBC
HCT: 24.5 % — ABNORMAL LOW (ref 39.0–52.0)
Hemoglobin: 8.5 g/dL — ABNORMAL LOW (ref 13.0–17.0)
MCH: 31.7 pg (ref 26.0–34.0)
MCHC: 34.7 g/dL (ref 30.0–36.0)
MCV: 91.4 fL (ref 80.0–100.0)
Platelets: 122 10*3/uL — ABNORMAL LOW (ref 150–400)
RBC: 2.68 MIL/uL — ABNORMAL LOW (ref 4.22–5.81)
RDW: 14.7 % (ref 11.5–15.5)
WBC: 15.7 10*3/uL — ABNORMAL HIGH (ref 4.0–10.5)
nRBC: 0 % (ref 0.0–0.2)

## 2021-07-31 LAB — GLUCOSE, CAPILLARY
Glucose-Capillary: 128 mg/dL — ABNORMAL HIGH (ref 70–99)
Glucose-Capillary: 148 mg/dL — ABNORMAL HIGH (ref 70–99)
Glucose-Capillary: 134 mg/dL — ABNORMAL HIGH (ref 70–99)
Glucose-Capillary: 125 mg/dL — ABNORMAL HIGH (ref 70–99)
Glucose-Capillary: 135 mg/dL — ABNORMAL HIGH (ref 70–99)

## 2021-07-31 LAB — BASIC METABOLIC PANEL
Anion gap: 6 (ref 5–15)
BUN: 19 mg/dL (ref 8–23)
CO2: 23 mmol/L (ref 22–32)
Calcium: 8.3 mg/dL — ABNORMAL LOW (ref 8.9–10.3)
Chloride: 106 mmol/L (ref 98–111)
Creatinine, Ser: 1.26 mg/dL — ABNORMAL HIGH (ref 0.61–1.24)
GFR, Estimated: >60 mL/min (ref >60)
Glucose, Bld: 131 mg/dL — ABNORMAL HIGH (ref 70–99)
Potassium: 4.6 mmol/L (ref 3.5–5.1)
Sodium: 135 mmol/L (ref 135–145)

## 2021-07-31 MED ORDER — MIDODRINE HCL 5 MG PO TABS
10.0000 mg | ORAL_TABLET | Freq: Three times a day (TID) | ORAL | Status: DC
  Administered 2021-07-31 – 2021-08-02 (×8): 10 mg via ORAL
  Filled 2021-07-31 (×8): qty 2

## 2021-07-31 MED ORDER — AMIODARONE HCL IN DEXTROSE 360-4.14 MG/200ML-% IV SOLN
60.0000 mg/h | INTRAVENOUS | Status: DC
Start: 2021-07-31 — End: 2021-08-01
  Administered 2021-07-31: 60 mg/h via INTRAVENOUS
  Filled 2021-07-31: qty 400

## 2021-07-31 MED ORDER — TORSEMIDE 20 MG PO TABS
40.0000 mg | ORAL_TABLET | Freq: Every day | ORAL | Status: DC
  Administered 2021-07-31: 40 mg via ORAL
  Filled 2021-07-31: qty 2

## 2021-07-31 MED ORDER — INSULIN ASPART 100 UNIT/ML IJ SOLN
0.0000 [IU] | Freq: Three times a day (TID) | INTRAMUSCULAR | Status: DC
  Administered 2021-07-31 (×3): 2 [IU] via SUBCUTANEOUS

## 2021-07-31 MED ORDER — SODIUM CHLORIDE 0.9 % IV SOLN
12.5000 mg | Freq: Once | INTRAVENOUS | Status: AC
  Administered 2021-07-31: 12.5 mg via INTRAVENOUS
  Filled 2021-07-31: qty 0.5

## 2021-07-31 MED ORDER — AMIODARONE HCL IN DEXTROSE 360-4.14 MG/200ML-% IV SOLN: 30.0000 mg/h | INTRAVENOUS | Status: DC

## 2021-07-31 MED ORDER — AMIODARONE LOAD VIA INFUSION
150.0000 mg | Freq: Once | INTRAVENOUS | Status: AC
Start: 2021-07-31 — End: 2021-07-31
  Administered 2021-07-31: 150 mg via INTRAVENOUS
  Filled 2021-07-31: qty 83.34

## 2021-07-31 NOTE — Progress Notes (Signed)
2 Days Post-Op Procedure(s) (LRB): ?CORONARY ARTERY BYPASS GRAFTING (CABG) TIMES FOUR, USING LEFT INTERNAL MAMMARY ARTERY AND RIGHT GREATER SAPHENOUS VEIN HARVESTED ENDOSCOPICALLYY. (N/A) ?AORTIC VALVE REPLACEMENT (AVR) USING EDWARDS INSPIRIS AORTIC VALVE SIZE (N/A) ?TRANSESOPHAGEAL ECHOCARDIOGRAM (TEE) (N/A) ?Subjective: ?No complaints. No pain.  ? ?Objective: ?Vital signs in last 24 hours: ?Temp:  [98.1 ?F (36.7 ?C)-99.1 ?F (37.3 ?C)] 98.2 ?F (36.8 ?C) (04/05 8250) ?Pulse Rate:  [75-94] 80 (04/05 0645) ?Cardiac Rhythm: Atrial fibrillation (04/04 2000) ?Resp:  [11-26] 16 (04/05 0645) ?BP: (88-139)/(55-92) 107/65 (04/05 0645) ?SpO2:  [90 %-100 %] 100 % (04/05 0645) ?Arterial Line BP: (87-151)/(40-59) 113/47 (04/05 0645) ?Weight:  [76.4 kg] 76.4 kg (04/05 0500) ? ?Hemodynamic parameters for last 24 hours: ?PAP: (21-29)/(10-17) 21/10 ? ?Intake/Output from previous day: ?04/04 0701 - 04/05 0700 ?In: 1383.3 [I.V.:975.9; Blood:186; IV Piggyback:221.4] ?Out: 1052 [Urine:1052] ?Intake/Output this shift: ?No intake/output data recorded. ? ?General appearance: alert and cooperative ?Neurologic: intact ?Heart: regular rate and rhythm, S1, S2 normal, no murmur ?Lungs: clear to auscultation bilaterally ?Extremities: edema mild ?Wound: dressing dry ? ?Lab Results: ?Recent Labs  ?  07/30/21 ?1602 07/31/21 ?0401  ?WBC 14.4* 15.7*  ?HGB 8.4* 8.5*  ?HCT 25.3* 24.5*  ?PLT 123* 122*  ? ?BMET:  ?Recent Labs  ?  07/30/21 ?1602 07/31/21 ?0401  ?NA 135 135  ?K 4.6 4.6  ?CL 105 106  ?CO2 20* 23  ?GLUCOSE 148* 131*  ?BUN 18 19  ?CREATININE 1.26* 1.26*  ?CALCIUM 8.1* 8.3*  ?  ?PT/INR:  ?Recent Labs  ?  07/29/21 ?1434  ?LABPROT 19.1*  ?INR 1.6*  ? ?ABG ?   ?Component Value Date/Time  ? PHART 7.357 07/29/2021 2205  ? HCO3 21.9 07/29/2021 2205  ? TCO2 23 07/29/2021 2205  ? ACIDBASEDEF 3.0 (H) 07/29/2021 2205  ? O2SAT 99 07/29/2021 2205  ? ?CBG (last 3)  ?Recent Labs  ?  07/30/21 ?2354 07/31/21 ?0359 07/31/21 ?0637  ?GLUCAP 147* 128* 148*   ? ?CXR: mild left base atelectasis ? ?Assessment/Plan: ?S/P Procedure(s) (LRB): ?CORONARY ARTERY BYPASS GRAFTING (CABG) TIMES FOUR, USING LEFT INTERNAL MAMMARY ARTERY AND RIGHT GREATER SAPHENOUS VEIN HARVESTED ENDOSCOPICALLYY. (N/A) ?AORTIC VALVE REPLACEMENT (AVR) USING EDWARDS INSPIRIS AORTIC VALVE SIZE (N/A) ?TRANSESOPHAGEAL ECHOCARDIOGRAM (TEE) (N/A) ? ?POD 2 ?Hemodynamically stable off neo but still on 2 dop for kidneys and SBP 90's. Rhythm is sinus 70's. Will add midodrine to support BP short term. Hold off on beta blocker. Wean off dopamine. ? ?Volume excess: wt is about 15 lbs over preop. Will start oral Torsemide today. ? ?Hgb improved with transfusion yesterday. Continue iron. ? ?DM: glucose under adequate control on Levemir and SSI. ? ?DC arterial line, foley. Sleeve can come out when off dopamine. ? ?Continue IS, ambulation. ? ? LOS: 8 days  ? ? ?Alleen Borne ?07/31/2021 ? ? ?

## 2021-07-31 NOTE — Progress Notes (Signed)
EVENING ROUNDS NOTE : ? ?   ?[address] ?      [place] [address] ?            [phone]   ?              ?2 Days Post-Op ?Procedure(s) (LRB): ?CORONARY ARTERY BYPASS GRAFTING (CABG) TIMES FOUR, USING LEFT INTERNAL MAMMARY ARTERY AND RIGHT GREATER SAPHENOUS VEIN HARVESTED ENDOSCOPICALLYY. (N/A) ?AORTIC VALVE REPLACEMENT (AVR) USING EDWARDS INSPIRIS AORTIC VALVE SIZE (N/A) ?TRANSESOPHAGEAL ECHOCARDIOGRAM (TEE) (N/A) ? ? ?Total Length of Stay:  LOS: 8 days  ?Events:   ?No events ?Resting comfortably ?Still pacing ? ? ? ?BP 113/62   Pulse 64   Temp 98.4 ?F (36.9 ?C) (Oral)   Resp (!) 22   Ht 5\' 3"  (1.6 m)   Wt 76.4 kg   SpO2 96%   BMI 29.84 kg/m?  ? ?  ? ?  ? ? sodium chloride Stopped (07/30/21 0951)  ? sodium chloride    ? sodium chloride 10 mL/hr at 07/29/21 1457  ? DOPamine Stopped (07/31/21 1209)  ? lactated ringers 10 mL/hr at 07/29/21 1456  ? lactated ringers Stopped (07/31/21 1453)  ? nitroGLYCERIN    ? phenylephrine (NEO-SYNEPHRINE) Adult infusion Stopped (07/30/21 1715)  ? ? ?I/O last 3 completed shifts: ?In: 2437.6 [I.V.:1259.1; Blood:186; IV Piggyback:992.4] ?Out: 2322 [Urine:2172; Chest Tube:150] ? ? ? ?  Latest Ref Rng & Units 07/31/2021  ?  4:01 AM 07/30/2021  ?  4:02 PM 07/30/2021  ?  4:11 AM  ?CBC  ?WBC 4.0 - 10.5 K/uL 15.7   14.4   15.0    ?Hemoglobin 13.0 - 17.0 g/dL 8.5   8.4   7.5    ?Hematocrit 39.0 - 52.0 % 24.5   25.3   22.7    ?Platelets 150 - 400 K/uL 122   123   135    ? ? ? ?  Latest Ref Rng & Units 07/31/2021  ?  4:01 AM 07/30/2021  ?  4:02 PM 07/30/2021  ?  4:11 AM  ?BMP  ?Glucose 70 - 99 mg/dL 09/29/2021   729   021    ?BUN 8 - 23 mg/dL 19   18   17     ?Creatinine 0.61 - 1.24 mg/dL 115     5.20    ?Sodium 135 - 145 mmol/L 135   135   138    ?Potassium 3.5 - 5.1 mmol/L 4.6   4.6   5.0    ?Chloride 98 - 111 mmol/L 106   105   109    ?CO2 22 - 32 mmol/L 23   20   20     ?Calcium 8.9 - 10.3 mg/dL 8.3   8.1   8.4    ? ? ?ABG ?   ?Component Value Date/Time  ? PHART 7.357  07/29/2021 2205  ? PCO2ART 38.9 07/29/2021 2205  ? PO2ART 152 (H) 07/29/2021 2205  ? HCO3 21.9 07/29/2021 2205  ? TCO2 23 07/29/2021 2205  ? ACIDBASEDEF 3.0 (H) 07/29/2021 2205  ? O2SAT 99 07/29/2021 2205  ? ? ? ? ? ?2206, MD ?07/31/2021 5:01 PM ? ? ?

## 2021-08-01 ENCOUNTER — Inpatient Hospital Stay (HOSPITAL_COMMUNITY): Payer: Medicare Other

## 2021-08-01 LAB — POCT I-STAT 7, (LYTES, BLD GAS, ICA,H+H)
Acid-base deficit: 4 mmol/L — ABNORMAL HIGH (ref 0.0–2.0)
Bicarbonate: 21.2 mmol/L (ref 20.0–28.0)
Calcium, Ion: 1.2 mmol/L (ref 1.15–1.40)
HCT: 21 % — ABNORMAL LOW (ref 39.0–52.0)
Hemoglobin: 7.1 g/dL — ABNORMAL LOW (ref 13.0–17.0)
O2 Saturation: 100 %
Patient temperature: 36.7
Potassium: 4.8 mmol/L (ref 3.5–5.1)
Sodium: 138 mmol/L (ref 135–145)
TCO2: 22 mmol/L (ref 22–32)
pCO2 arterial: 36.6 mmHg (ref 32–48)
pH, Arterial: 7.369 (ref 7.35–7.45)
pO2, Arterial: 177 mmHg — ABNORMAL HIGH (ref 83–108)

## 2021-08-01 LAB — CBC
HCT: 23 % — ABNORMAL LOW (ref 39.0–52.0)
Hemoglobin: 7.5 g/dL — ABNORMAL LOW (ref 13.0–17.0)
MCH: 30.7 pg (ref 26.0–34.0)
MCHC: 32.6 g/dL (ref 30.0–36.0)
MCV: 94.3 fL (ref 80.0–100.0)
Platelets: 144 10*3/uL — ABNORMAL LOW (ref 150–400)
RBC: 2.44 MIL/uL — ABNORMAL LOW (ref 4.22–5.81)
RDW: 14.7 % (ref 11.5–15.5)
WBC: 13.8 10*3/uL — ABNORMAL HIGH (ref 4.0–10.5)
nRBC: 0 % (ref 0.0–0.2)

## 2021-08-01 LAB — BPAM RBC
Blood Product Expiration Date: 202305032359
Blood Product Expiration Date: 202305032359
Blood Product Expiration Date: 202305032359
Blood Product Expiration Date: 202305032359
ISSUE DATE / TIME: 202304030818
ISSUE DATE / TIME: 202304041058
Unit Type and Rh: 6200
Unit Type and Rh: 6200
Unit Type and Rh: 6200
Unit Type and Rh: 6200

## 2021-08-01 LAB — TYPE AND SCREEN
ABO/RH(D): A POS
Antibody Screen: NEGATIVE
Unit division: 0
Unit division: 0
Unit division: 0
Unit division: 0

## 2021-08-01 LAB — GLUCOSE, CAPILLARY
Glucose-Capillary: 89 mg/dL (ref 70–99)
Glucose-Capillary: 85 mg/dL (ref 70–99)
Glucose-Capillary: 80 mg/dL (ref 70–99)
Glucose-Capillary: 56 mg/dL — ABNORMAL LOW (ref 70–99)
Glucose-Capillary: 70 mg/dL (ref 70–99)

## 2021-08-01 LAB — BASIC METABOLIC PANEL
Anion gap: 7 (ref 5–15)
BUN: 26 mg/dL — ABNORMAL HIGH (ref 8–23)
CO2: 24 mmol/L (ref 22–32)
Calcium: 7.8 mg/dL — ABNORMAL LOW (ref 8.9–10.3)
Chloride: 102 mmol/L (ref 98–111)
Creatinine, Ser: 1.59 mg/dL — ABNORMAL HIGH (ref 0.61–1.24)
GFR, Estimated: 48 mL/min — ABNORMAL LOW (ref >60)
Glucose, Bld: 101 mg/dL — ABNORMAL HIGH (ref 70–99)
Potassium: 3.8 mmol/L (ref 3.5–5.1)
Sodium: 133 mmol/L — ABNORMAL LOW (ref 135–145)

## 2021-08-01 LAB — PREPARE RBC (CROSSMATCH)

## 2021-08-01 MED ORDER — ASPIRIN 81 MG PO CHEW: 81.0000 mg | CHEWABLE_TABLET | Freq: Every day | ORAL | Status: DC

## 2021-08-01 MED ORDER — SORBITOL 70 % SOLN
15.0000 mL | Freq: Once | Status: AC
  Administered 2021-08-01: 15 mL via ORAL
  Filled 2021-08-01: qty 30

## 2021-08-01 MED ORDER — ASPIRIN EC 81 MG PO TBEC
81.0000 mg | DELAYED_RELEASE_TABLET | Freq: Every day | ORAL | Status: DC
  Administered 2021-08-01 – 2021-08-02 (×2): 81 mg via ORAL
  Filled 2021-08-01 (×2): qty 1

## 2021-08-01 MED ORDER — ENSURE ENLIVE PO LIQD
237.0000 mL | Freq: Three times a day (TID) | ORAL | Status: DC
  Administered 2021-08-01 – 2021-08-02 (×2): 237 mL via ORAL

## 2021-08-01 MED ORDER — METOCLOPRAMIDE HCL 5 MG PO TABS
10.0000 mg | ORAL_TABLET | Freq: Four times a day (QID) | ORAL | Status: AC
  Administered 2021-08-01 – 2021-08-02 (×4): 10 mg via ORAL
  Filled 2021-08-01 (×4): qty 1

## 2021-08-01 MED ORDER — WARFARIN - PHYSICIAN DOSING INPATIENT: Freq: Every day | Status: DC

## 2021-08-01 MED ORDER — SODIUM CHLORIDE 0.9 % IV SOLN
6.2500 mg | Freq: Once | INTRAVENOUS | Status: AC
  Administered 2021-08-01: 6.25 mg via INTRAVENOUS
  Filled 2021-08-01: qty 0.25

## 2021-08-01 MED ORDER — TRAMADOL HCL 50 MG PO TABS: 50.0000 mg | ORAL_TABLET | ORAL | Status: DC | PRN

## 2021-08-01 MED ORDER — SIMETHICONE 80 MG PO CHEW
80.0000 mg | CHEWABLE_TABLET | Freq: Four times a day (QID) | ORAL | Status: AC
Start: 2021-08-01 — End: 2021-08-02
  Administered 2021-08-01 – 2021-08-02 (×6): 80 mg via ORAL
  Filled 2021-08-01 (×5): qty 1

## 2021-08-01 MED ORDER — SODIUM CHLORIDE 0.9% IV SOLUTION: Freq: Once | INTRAVENOUS | Status: DC

## 2021-08-01 MED ORDER — WARFARIN SODIUM 2.5 MG PO TABS
2.5000 mg | ORAL_TABLET | Freq: Every day | ORAL | Status: DC
Start: 2021-08-01 — End: 2021-08-03
  Administered 2021-08-01 – 2021-08-02 (×2): 2.5 mg via ORAL
  Filled 2021-08-01 (×2): qty 1

## 2021-08-01 MED ORDER — METOCLOPRAMIDE HCL 10 MG PO TABS
10.0000 mg | ORAL_TABLET | Freq: Three times a day (TID) | ORAL | Status: DC | PRN
  Administered 2021-08-01: 10 mg via ORAL
  Filled 2021-08-01 (×2): qty 1

## 2021-08-01 MED FILL — Heparin Sodium (Porcine) Inj 1000 Unit/ML: Qty: 1000 | Status: AC

## 2021-08-01 MED FILL — Mannitol IV Soln 20%: INTRAVENOUS | Qty: 500 | Status: AC

## 2021-08-01 MED FILL — Potassium Chloride Inj 2 mEq/ML: INTRAVENOUS | Qty: 20 | Status: AC

## 2021-08-01 MED FILL — Sodium Chloride IV Soln 0.9%: INTRAVENOUS | Qty: 2000 | Status: AC

## 2021-08-01 MED FILL — Sodium Bicarbonate IV Soln 8.4%: INTRAVENOUS | Qty: 50 | Status: AC

## 2021-08-01 MED FILL — Lidocaine HCl Local Preservative Free (PF) Inj 2%: INTRAMUSCULAR | Qty: 15 | Status: AC

## 2021-08-01 MED FILL — Heparin Sodium (Porcine) Inj 1000 Unit/ML: INTRAMUSCULAR | Qty: 20 | Status: AC

## 2021-08-01 MED FILL — Electrolyte-R (PH 7.4) Solution: INTRAVENOUS | Qty: 6000 | Status: AC

## 2021-08-01 NOTE — Progress Notes (Addendum)
CT surgery PM rounds ? ?Patient experiencing significant nausea and vomiting and some hiccups. ?We will repeat 1 dose of Phenergan which helped last night ?Reglan every 6 hours has been started ?Otherwise maintaining sinus rhythm with stable vitals ?Transfused 1 unit of blood earlier today for hemoglobin 7.5 ? ?Blood pressure (!) 101/91, pulse 61, temperature 98.2 ?F (36.8 ?C), temperature source Oral, resp. rate 16, height 5\' 3"  (1.6 m), weight 77 kg, SpO2 95 %.  ?

## 2021-08-01 NOTE — Progress Notes (Signed)
Initial Nutrition Assessment ? ?DOCUMENTATION CODES:  ? ?Not applicable ? ?INTERVENTION:  ? ?Ensure Enlive po TD, each supplement provides 350 kcal and 20 grams of protein. ? ?Recommend increasing bowel regimen-given moderate to large stool burder on abd xray and no BM since surgery; Continue reglan ? ?Liberalize diet to Regular-no restrictions until appetite, taste and po intake improve. Pt is aware that ultimately he will need to follow a heart healthy, carb modified diet ? ?NUTRITION DIAGNOSIS:  ? ?Inadequate oral intake related to acute illness, vomiting, poor appetite (altered taste) as evidenced by per patient/family report, meal completion < 25%. ? ?GOAL:  ? ?Patient will meet greater than or equal to 90% of their needs ? ?MONITOR:  ? ?PO intake, Supplement acceptance, Labs, Weight trends ? ?REASON FOR ASSESSMENT:  ? ?Rounds ?  ? ?ASSESSMENT:  ? 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 PMH includes DM, HTN, CAD, tobacco use ? ?3/28 Admitted ?4/03 CABG x 4, Extubation ? ?Pt has essentially not eaten anything since surgery. Recorded po intake 0-10%. Pt did not eat breakfast this AM, did not want lunch. Per RN, pt only at 2 bites of mashed potatoes yesterday and nothing else.  Pt reports no appetite and no taste; reports nothing sounds good and the food has no taste. PO intake prior to surgery 50-100% of meals. Pt reports good appetite at home PTA; eating well.  ? ?Some dry heaves this AM after taking meds. Pt then vomited after receiving more meds. Pt denies nausea associated with this. Pt has been experiencing vomiting without associated nausea.  ? ?No BM since 4/1. +Flatus. Pt reports he typically goes one week between BM at home. He does not "feel" constipated at this time.  ? ?Abd xray with non-obstructive bowel gas pattern; moderate to large amount of stool in colon, no fecal impaction in rectum. ? ?Pt is getting up and working with PT.  ? ?Pt reports weight loss PTA but  reports this was intentional. Pt reports he was eating smaller portions of the same food and has been walking regularly.  ? ?Labs: reviewed; CBGs 85-148 ?Meds: trinsicon, ss novolog, levemir ? ? ?NUTRITION - FOCUSED PHYSICAL EXAM: ? ?Flowsheet Row Most Recent Value  ?Orbital Region No depletion  ?Upper Arm Region No depletion  ?Thoracic and Lumbar Region No depletion  ?Buccal Region No depletion  ?Temple Region No depletion  ?Clavicle Bone Region No depletion  ?Clavicle and Acromion Bone Region No depletion  ?Scapular Bone Region No depletion  ?Dorsal Hand No depletion  ?Patellar Region No depletion  ?Anterior Thigh Region No depletion  ?Posterior Calf Region No depletion  ?Edema (RD Assessment) Mild  ?Hair Reviewed  ?Eyes Reviewed  ?Mouth Reviewed  ?Skin Reviewed  ?Nails Reviewed  ? ?  ? ? ? ?Diet Order:   ?Diet Order   ? ?       ?  Diet regular Room service appropriate? Yes; Fluid consistency: Thin  Diet effective now       ?  ? ?  ?  ? ?  ? ? ?EDUCATION NEEDS:  ? ?Education needs have been addressed ? ?Skin:  Skin Assessment: Reviewed RN Assessment ? ?Last BM:  4/01 ? ?Height:  ? ?Ht Readings from Last 1 Encounters:  ?07/23/21 5\' 3"  (1.6 m)  ? ? ?Weight:  ? ?Wt Readings from Last 1 Encounters:  ?08/01/21 77 kg  ? ? ? ?BMI:  Body mass index is 30.07 kg/m?. ? ?Estimated Nutritional  Needs:  ? ?Kcal:  2000-2200 kcals ? ?Protein:  100-115 g ? ?Fluid:  >/= 2L ? ? ?Romelle Starcher MS, RDN, LDN, CNSC ?Registered Dietitian III ?Clinical Nutrition ?RD Pager and On-Call Pager Number Located in [place]  ? ?

## 2021-08-01 NOTE — Progress Notes (Signed)
Upon arrival to shift, pt noted to be in rate controlled A fib (HR in 70s/80s); since surgery, pt has been NSR/SB. Additionally, pt dealing w nausea & vomiting unrelieved by Zofran. Lightfoot MD made aware. 12.5 mg phenergan ordered for nausea and amio bolus & gtt ordered for A fib. Clarified that bolus & gtt was desired given that HR was WNL and pt required pacing earlier in day--MD confirmed.  ? ?At 2241, pt vagaled down into 40s while throwing up. HR back into 60s/70s shortly after. VS WNL. ? ?At 0023, pt's HR dropped to 30s (junctional?) while pt was resting comfortably in bed. Pt symptomatic w/ lightheadedness & hypotension (MAP 55). Pacer restarted--VVI at 80 bpm (later changed to DDD for increased BP support after converting into NRS/SB). Immediate relief in lightheadedness w/ pacer initiation. Amio paused. Pt flipping between dual chamber pacing & rate controlled A fib. Lightfoot MD called--verbal order to DC amio gtt. Hypotension improving w DC of amio. Care ongoing.  ? ?

## 2021-08-01 NOTE — Progress Notes (Signed)
3 Days Post-Op Procedure(s) (LRB): ?CORONARY ARTERY BYPASS GRAFTING (CABG) TIMES FOUR, USING LEFT INTERNAL MAMMARY ARTERY AND RIGHT GREATER SAPHENOUS VEIN HARVESTED ENDOSCOPICALLYY. (N/A) ?AORTIC VALVE REPLACEMENT (AVR) USING EDWARDS INSPIRIS AORTIC VALVE SIZE 23MM (N/A) ?TRANSESOPHAGEAL ECHOCARDIOGRAM (TEE) (N/A) ?Subjective: ?No specific complaints but doesn't feel great today. Tired from being up most of the night. ? ?Went into rate controlled atrial fib overnight and given amio bolus with bradycardia to 30's and hypotension. Paced and amio stopped. This am he is still in rate controlled AF in 60's to 70's. Pacer changed to VVI backup 50. ? ?Had some nausea and vomited overnight. Feels better this am. ? ?Objective: ?Vital signs in last 24 hours: ?Temp:  [98 ?F (36.7 ?C)-99 ?F (37.2 ?C)] 98.4 ?F (36.9 ?C) (04/06 0700) ?Pulse Rate:  [51-104] 92 (04/06 0645) ?Cardiac Rhythm: A-V Sequential paced (04/06 0400) ?Resp:  [14-28] 18 (04/06 0645) ?BP: (84-135)/(39-85) 84/64 (04/06 0645) ?SpO2:  [89 %-100 %] 92 % (04/06 0645) ?Arterial Line BP: (90-138)/(41-59) 101/49 (04/05 1450) ?Weight:  [77 kg] 77 kg (04/06 0500) ? ?Hemodynamic parameters for last 24 hours: ?  ? ?Intake/Output from previous day: ?04/05 0701 - 04/06 0700 ?In: 1176.3 [P.O.:720; I.V.:277.8; IV Piggyback:178.5] ?Out: 950 [Urine:950] ?Intake/Output this shift: ?No intake/output data recorded. ? ?General appearance: alert and cooperative ?Neurologic: intact ?Heart: irregularly irregular rhythm ?Lungs: clear to auscultation bilaterally ?Abdomen: soft, non-tender; bowel sounds normal ?Extremities: edema mild ?Wound: incision ok ? ?Lab Results: ?Recent Labs  ?  07/31/21 ?0401 08/01/21 ?0315  ?WBC 15.7* 13.8*  ?HGB 8.5* 7.5*  ?HCT 24.5* 23.0*  ?PLT 122* 144*  ? ?BMET:  ?Recent Labs  ?  07/31/21 ?0401 08/01/21 ?0315  ?NA 135 133*  ?K 4.6 3.8  ?CL 106 102  ?CO2 23 24  ?GLUCOSE 131* 101*  ?BUN 19 26*  ?CREATININE 1.26* 1.59*  ?CALCIUM 8.3* 7.8*  ?  ?PT/INR:  ?Recent  Labs  ?  07/29/21 ?1434  ?LABPROT 19.1*  ?INR 1.6*  ? ?ABG ?   ?Component Value Date/Time  ? PHART 7.357 07/29/2021 2205  ? HCO3 21.9 07/29/2021 2205  ? TCO2 23 07/29/2021 2205  ? ACIDBASEDEF 3.0 (H) 07/29/2021 2205  ? O2SAT 99 07/29/2021 2205  ? ?CBG (last 3)  ?Recent Labs  ?  07/31/21 ?1710 07/31/21 ?2122 08/01/21 ?0700  ?GLUCAP 125* 135* 89  ? ? ?Assessment/Plan: ?S/P Procedure(s) (LRB): ?CORONARY ARTERY BYPASS GRAFTING (CABG) TIMES FOUR, USING LEFT INTERNAL MAMMARY ARTERY AND RIGHT GREATER SAPHENOUS VEIN HARVESTED ENDOSCOPICALLYY. (N/A) ?AORTIC VALVE REPLACEMENT (AVR) USING EDWARDS INSPIRIS AORTIC VALVE SIZE 23MM (N/A) ?TRANSESOPHAGEAL ECHOCARDIOGRAM (TEE) (N/A) ? ?POD 3 ? ?Hemodynamically stable but BP on lower side and requiring midodrine. ? ?Sinus brady postop now with rate controlled atrial fib and marked sinus brady/junctional rhythm 30's after amio bolus. His rate is 60-70 in AF now. Will hold off on amio and beta blocker and plan anticoagulation with Coumadin. Hopefully he will convert on his own at some point. Pacer on VVI 50. ? ?Acute postop blood loss anemia: He received one unit PRBC's two days ago but Hgb still on low side for him and with borderline BP, AF, renal dysfunction I think he should get another unit. Continue iron. ? ?DM: glucose under good control ? ?Stage 2-3 CKD: creat bumped to 1.59 after a single dose of diuretic yesterday. Will hold off today and repeat in am. ? ?N/V: add Reglan. ? ?Continue IS, ambulation. ? ? LOS: 9 days  ? ? ?Gaye Pollack ?08/01/2021 ? ? ?

## 2021-08-02 LAB — POCT I-STAT EG7
Acid-Base Excess: 1 mmol/L (ref 0.0–2.0)
Bicarbonate: 25 mmol/L (ref 20.0–28.0)
Calcium, Ion: 1.03 mmol/L — ABNORMAL LOW (ref 1.15–1.40)
HCT: 20 % — ABNORMAL LOW (ref 39.0–52.0)
Hemoglobin: 6.8 g/dL — CL (ref 13.0–17.0)
O2 Saturation: 80 %
Potassium: 5.3 mmol/L — ABNORMAL HIGH (ref 3.5–5.1)
Sodium: 142 mmol/L (ref 135–145)
TCO2: 26 mmol/L (ref 22–32)
pCO2, Ven: 38 mmHg — ABNORMAL LOW (ref 44–60)
pH, Ven: 7.426 (ref 7.25–7.43)
pO2, Ven: 43 mmHg (ref 32–45)

## 2021-08-02 LAB — CBC
HCT: 26.6 % — ABNORMAL LOW (ref 39.0–52.0)
Hemoglobin: 9 g/dL — ABNORMAL LOW (ref 13.0–17.0)
MCH: 30.4 pg (ref 26.0–34.0)
MCHC: 33.8 g/dL (ref 30.0–36.0)
MCV: 89.9 fL (ref 80.0–100.0)
Platelets: 190 10*3/uL (ref 150–400)
RBC: 2.96 MIL/uL — ABNORMAL LOW (ref 4.22–5.81)
RDW: 16.3 % — ABNORMAL HIGH (ref 11.5–15.5)
WBC: 14.1 10*3/uL — ABNORMAL HIGH (ref 4.0–10.5)
nRBC: 0 % (ref 0.0–0.2)

## 2021-08-02 LAB — POCT I-STAT 7, (LYTES, BLD GAS, ICA,H+H)
Acid-Base Excess: 6 mmol/L — ABNORMAL HIGH (ref 0.0–2.0)
Acid-Base Excess: 2 mmol/L (ref 0.0–2.0)
Acid-Base Excess: 8 mmol/L — ABNORMAL HIGH (ref 0.0–2.0)
Acid-Base Excess: 6 mmol/L — ABNORMAL HIGH (ref 0.0–2.0)
Acid-Base Excess: 2 mmol/L (ref 0.0–2.0)
Bicarbonate: 28 mmol/L (ref 20.0–28.0)
Bicarbonate: 25.9 mmol/L (ref 20.0–28.0)
Bicarbonate: 31.7 mmol/L — ABNORMAL HIGH (ref 20.0–28.0)
Bicarbonate: 28.7 mmol/L — ABNORMAL HIGH (ref 20.0–28.0)
Bicarbonate: 25.9 mmol/L (ref 20.0–28.0)
Calcium, Ion: 1.13 mmol/L — ABNORMAL LOW (ref 1.15–1.40)
Calcium, Ion: 0.95 mmol/L — ABNORMAL LOW (ref 1.15–1.40)
Calcium, Ion: 0.95 mmol/L — ABNORMAL LOW (ref 1.15–1.40)
Calcium, Ion: 1.03 mmol/L — ABNORMAL LOW (ref 1.15–1.40)
Calcium, Ion: 1.04 mmol/L — ABNORMAL LOW (ref 1.15–1.40)
HCT: 30 % — ABNORMAL LOW (ref 39.0–52.0)
HCT: 20 % — ABNORMAL LOW (ref 39.0–52.0)
HCT: 25 % — ABNORMAL LOW (ref 39.0–52.0)
HCT: 25 % — ABNORMAL LOW (ref 39.0–52.0)
HCT: 25 % — ABNORMAL LOW (ref 39.0–52.0)
Hemoglobin: 10.2 g/dL — ABNORMAL LOW (ref 13.0–17.0)
Hemoglobin: 6.8 g/dL — CL (ref 13.0–17.0)
Hemoglobin: 8.5 g/dL — ABNORMAL LOW (ref 13.0–17.0)
Hemoglobin: 8.5 g/dL — ABNORMAL LOW (ref 13.0–17.0)
Hemoglobin: 8.5 g/dL — ABNORMAL LOW (ref 13.0–17.0)
O2 Saturation: 98 %
O2 Saturation: 100 %
O2 Saturation: 100 %
O2 Saturation: 100 %
O2 Saturation: 100 %
Potassium: 3.6 mmol/L (ref 3.5–5.1)
Potassium: 3.7 mmol/L (ref 3.5–5.1)
Potassium: 4.2 mmol/L (ref 3.5–5.1)
Potassium: 4.2 mmol/L (ref 3.5–5.1)
Potassium: 4.4 mmol/L (ref 3.5–5.1)
Sodium: 144 mmol/L (ref 135–145)
Sodium: 143 mmol/L (ref 135–145)
Sodium: 144 mmol/L (ref 135–145)
Sodium: 141 mmol/L (ref 135–145)
Sodium: 141 mmol/L (ref 135–145)
TCO2: 29 mmol/L (ref 22–32)
TCO2: 27 mmol/L (ref 22–32)
TCO2: 33 mmol/L — ABNORMAL HIGH (ref 22–32)
TCO2: 30 mmol/L (ref 22–32)
TCO2: 27 mmol/L (ref 22–32)
pCO2 arterial: 30.1 mmHg — ABNORMAL LOW (ref 32–48)
pCO2 arterial: 37 mmHg (ref 32–48)
pCO2 arterial: 41 mmHg (ref 32–48)
pCO2 arterial: 33.4 mmHg (ref 32–48)
pCO2 arterial: 34.1 mmHg (ref 32–48)
pH, Arterial: 7.578 — ABNORMAL HIGH (ref 7.35–7.45)
pH, Arterial: 7.452 — ABNORMAL HIGH (ref 7.35–7.45)
pH, Arterial: 7.497 — ABNORMAL HIGH (ref 7.35–7.45)
pH, Arterial: 7.542 — ABNORMAL HIGH (ref 7.35–7.45)
pH, Arterial: 7.488 — ABNORMAL HIGH (ref 7.35–7.45)
pO2, Arterial: 87 mmHg (ref 83–108)
pO2, Arterial: 369 mmHg — ABNORMAL HIGH (ref 83–108)
pO2, Arterial: 370 mmHg — ABNORMAL HIGH (ref 83–108)
pO2, Arterial: 368 mmHg — ABNORMAL HIGH (ref 83–108)
pO2, Arterial: 315 mmHg — ABNORMAL HIGH (ref 83–108)

## 2021-08-02 LAB — BPAM RBC
Blood Product Expiration Date: 202305062359
ISSUE DATE / TIME: 202304060946
Unit Type and Rh: 6200

## 2021-08-02 LAB — POCT I-STAT, CHEM 8
BUN: 35 mg/dL — ABNORMAL HIGH (ref 8–23)
BUN: 31 mg/dL — ABNORMAL HIGH (ref 8–23)
BUN: 27 mg/dL — ABNORMAL HIGH (ref 8–23)
BUN: 28 mg/dL — ABNORMAL HIGH (ref 8–23)
Calcium, Ion: 1.12 mmol/L — ABNORMAL LOW (ref 1.15–1.40)
Calcium, Ion: 1.14 mmol/L — ABNORMAL LOW (ref 1.15–1.40)
Calcium, Ion: 1.01 mmol/L — ABNORMAL LOW (ref 1.15–1.40)
Calcium, Ion: 1.05 mmol/L — ABNORMAL LOW (ref 1.15–1.40)
Chloride: 103 mmol/L (ref 98–111)
Chloride: 104 mmol/L (ref 98–111)
Chloride: 104 mmol/L (ref 98–111)
Chloride: 105 mmol/L (ref 98–111)
Creatinine, Ser: 0.7 mg/dL (ref 0.61–1.24)
Creatinine, Ser: 0.7 mg/dL (ref 0.61–1.24)
Creatinine, Ser: 0.5 mg/dL — ABNORMAL LOW (ref 0.61–1.24)
Creatinine, Ser: 0.5 mg/dL — ABNORMAL LOW (ref 0.61–1.24)
Glucose, Bld: 137 mg/dL — ABNORMAL HIGH (ref 70–99)
Glucose, Bld: 112 mg/dL — ABNORMAL HIGH (ref 70–99)
Glucose, Bld: 123 mg/dL — ABNORMAL HIGH (ref 70–99)
Glucose, Bld: 144 mg/dL — ABNORMAL HIGH (ref 70–99)
HCT: 28 % — ABNORMAL LOW (ref 39.0–52.0)
HCT: 26 % — ABNORMAL LOW (ref 39.0–52.0)
HCT: 25 % — ABNORMAL LOW (ref 39.0–52.0)
HCT: 25 % — ABNORMAL LOW (ref 39.0–52.0)
Hemoglobin: 9.5 g/dL — ABNORMAL LOW (ref 13.0–17.0)
Hemoglobin: 8.8 g/dL — ABNORMAL LOW (ref 13.0–17.0)
Hemoglobin: 8.5 g/dL — ABNORMAL LOW (ref 13.0–17.0)
Hemoglobin: 8.5 g/dL — ABNORMAL LOW (ref 13.0–17.0)
Potassium: 3.6 mmol/L (ref 3.5–5.1)
Potassium: 3.2 mmol/L — ABNORMAL LOW (ref 3.5–5.1)
Potassium: 4 mmol/L (ref 3.5–5.1)
Potassium: 4.2 mmol/L (ref 3.5–5.1)
Sodium: 143 mmol/L (ref 135–145)
Sodium: 144 mmol/L (ref 135–145)
Sodium: 141 mmol/L (ref 135–145)
Sodium: 142 mmol/L (ref 135–145)
TCO2: 27 mmol/L (ref 22–32)
TCO2: 27 mmol/L (ref 22–32)
TCO2: 30 mmol/L (ref 22–32)
TCO2: 30 mmol/L (ref 22–32)

## 2021-08-02 LAB — BASIC METABOLIC PANEL
Anion gap: 7 (ref 5–15)
BUN: 32 mg/dL — ABNORMAL HIGH (ref 8–23)
CO2: 24 mmol/L (ref 22–32)
Calcium: 7.9 mg/dL — ABNORMAL LOW (ref 8.9–10.3)
Chloride: 103 mmol/L (ref 98–111)
Creatinine, Ser: 1.64 mg/dL — ABNORMAL HIGH (ref 0.61–1.24)
GFR, Estimated: 46 mL/min — ABNORMAL LOW (ref >60)
Glucose, Bld: 65 mg/dL — ABNORMAL LOW (ref 70–99)
Potassium: 3.9 mmol/L (ref 3.5–5.1)
Sodium: 134 mmol/L — ABNORMAL LOW (ref 135–145)

## 2021-08-02 LAB — TYPE AND SCREEN
ABO/RH(D): A POS
Antibody Screen: NEGATIVE
Unit division: 0

## 2021-08-02 LAB — GLUCOSE, CAPILLARY
Glucose-Capillary: 100 mg/dL — ABNORMAL HIGH (ref 70–99)
Glucose-Capillary: 103 mg/dL — ABNORMAL HIGH (ref 70–99)
Glucose-Capillary: 107 mg/dL — ABNORMAL HIGH (ref 70–99)
Glucose-Capillary: 121 mg/dL — ABNORMAL HIGH (ref 70–99)
Glucose-Capillary: 68 mg/dL — ABNORMAL LOW (ref 70–99)
Glucose-Capillary: 69 mg/dL — ABNORMAL LOW (ref 70–99)
Glucose-Capillary: 78 mg/dL (ref 70–99)

## 2021-08-02 LAB — PROTIME-INR
INR: 1.6 — ABNORMAL HIGH (ref 0.8–1.2)
Prothrombin Time: 18.5 seconds — ABNORMAL HIGH (ref 11.4–15.2)

## 2021-08-02 MED ORDER — SODIUM CHLORIDE 0.9% FLUSH: 3.0000 mL | INTRAVENOUS | Status: DC | PRN

## 2021-08-02 MED ORDER — ASPIRIN EC 81 MG PO TBEC
81.0000 mg | DELAYED_RELEASE_TABLET | Freq: Every day | ORAL | Status: DC
  Administered 2021-08-03 – 2021-08-05 (×3): 81 mg via ORAL
  Filled 2021-08-02 (×3): qty 1

## 2021-08-02 MED ORDER — SODIUM CHLORIDE 0.9 % IV SOLN: 250.0000 mL | INTRAVENOUS | Status: DC | PRN

## 2021-08-02 MED ORDER — SODIUM CHLORIDE 0.9% FLUSH
3.0000 mL | Freq: Two times a day (BID) | INTRAVENOUS | Status: DC
  Administered 2021-08-02 – 2021-08-05 (×5): 3 mL via INTRAVENOUS

## 2021-08-02 MED ORDER — ~~LOC~~ CARDIAC SURGERY, PATIENT & FAMILY EDUCATION: Freq: Once | Status: AC

## 2021-08-02 NOTE — Progress Notes (Signed)
CARDIAC REHAB PHASE I  ? ?PRE:  Rate/Rhythm: 72 SR ? ?  BP: sitting 132/70 ? ?  SaO2: 97 RA ? ?MODE:  Ambulation: 370 ft  ? ?POST:  Rate/Rhythm: 95 SR ? ?  BP: sitting 130/79  ? ?  SaO2: 95 RA ? ?Pt needed mod assist to get out of bed from supine. Reminders given for sternal precautions. Walked with RW, slow and steady. Tolerated well. Return to bed for nap. Encouraged another walk and IS. Pt needs RW for home. ?[id number]  ? ?Ethelda Chick CES, ACSM ?08/02/2021 ?2:15 PM ? ? ? ? ?

## 2021-08-02 NOTE — Progress Notes (Signed)
Pt received from 2H. VSS. Telemetry applied. Pt oriented to room and unit. Call light in reach.   Lukasz Rogus, RN  

## 2021-08-02 NOTE — Progress Notes (Signed)
Inpatient Diabetes Program Recommendations ? ?AACE/ADA: New Consensus Statement on Inpatient Glycemic Control (2015) ? ?Target Ranges:  Prepandial:   less than 140 mg/dL ?     Peak postprandial:   less than 180 mg/dL (1-2 hours) ?     Critically ill patients:  140 - 180 mg/dL  ? ?Lab Results  ?Component Value Date  ? GLUCAP 107 (H) 08/02/2021  ? HGBA1C 8.4 (H) 07/23/2021  ? ? ?Review of Glycemic Control ? ?Diabetes history: DM2 ?Outpatient Diabetes medications: Farxiga 10 mg QD ?Current orders for Inpatient glycemic control: Novolog 0-24 units TID ? ?Hypos this am. Blood sugars have been running on low side of normal. ? ?Inpatient Diabetes Program Recommendations:   ? ?Consider decreasing Novolog to 0-9 units TID. ? ?Continue to follow. ? ?Thank you. ?Ailene Ards, RD, LDN, CDE ?Inpatient Diabetes Coordinator ?[phone]  ? ? ? ? ?

## 2021-08-02 NOTE — Progress Notes (Signed)
Mobility Specialist Progress Note ? ? 08/02/21 1732  ?Mobility  ?Activity Ambulated with assistance in hallway  ?Level of Assistance Standby assist, set-up cues, supervision of patient - no hands on  ?Assistive Device Front wheel walker  ?Distance Ambulated (ft) 200 ft  ?Activity Response Tolerated well  ?$Mobility charge 1 Mobility  ? ?Pre Mobility: 84 HR ?During Mobility: 93 HR ?Post Mobility: 81 HR, 147/75 BP ? ?Received pt in chair having no complaints and agreeable. X1 standing rest break d/t fatigue, returned back to chair w/ call bell in reach and no faults. ? ?Frederico Hamman ?Mobility Specialist ?Phone Number [phone] ? ?

## 2021-08-02 NOTE — Progress Notes (Signed)
4 Days Post-Op Procedure(s) (LRB): ?CORONARY ARTERY BYPASS GRAFTING (CABG) TIMES FOUR, USING LEFT INTERNAL MAMMARY ARTERY AND RIGHT GREATER SAPHENOUS VEIN HARVESTED ENDOSCOPICALLYY. (N/A) ?AORTIC VALVE REPLACEMENT (AVR) USING EDWARDS INSPIRIS AORTIC VALVE SIZE 23MM (N/A) ?TRANSESOPHAGEAL ECHOCARDIOGRAM (TEE) (N/A) ?Subjective: ?Feels better. Some nausea overnight and received phenergan but able to eat this am. Passing flatus ? ?Objective: ?Vital signs in last 24 hours: ?Temp:  [97.4 ?F (36.3 ?C)-98.9 ?F (37.2 ?C)] 97.4 ?F (36.3 ?C) (04/07 0700) ?Pulse Rate:  [57-78] 65 (04/07 0800) ?Cardiac Rhythm: Normal sinus rhythm (04/07 0800) ?Resp:  [9-21] 15 (04/07 0800) ?BP: (101-164)/(55-93) 105/65 (04/07 0800) ?SpO2:  [87 %-99 %] 99 % (04/07 0800) ?Weight:  [75.6 kg] 75.6 kg (04/07 0500) ? ?Hemodynamic parameters for last 24 hours: ?  ? ?Intake/Output from previous day: ?04/06 0701 - 04/07 0700 ?In: 1155 [P.O.:840; Blood:315] ?Out: 1250 [Urine:1250] ?Intake/Output this shift: ?Total I/O ?In: 120 [P.O.:120] ?Out: -  ? ?General appearance: alert and cooperative ?Neurologic: intact ?Heart: regular rate and rhythm ?Lungs: clear to auscultation bilaterally ?Abdomen: soft, non-tender; bowel sounds normal ?Extremities: edema mild ?Wound: incision ok ? ?Lab Results: ?Recent Labs  ?  08/01/21 ?[id number] 08/02/21 ?0447  ?WBC 13.8* 14.1*  ?HGB 7.5* 9.0*  ?HCT 23.0* 26.6*  ?PLT 144* 190  ? ?BMET:  ?Recent Labs  ?  08/01/21 ?[id number] 08/02/21 ?0447  ?NA 133* 134*  ?K 3.8 3.9  ?CL 102 103  ?CO2 24 24  ?GLUCOSE 101* 65*  ?BUN 26* 32*  ?CREATININE 1.59* 1.64*  ?CALCIUM 7.8* 7.9*  ?  ?PT/INR:  ?Recent Labs  ?  08/02/21 ?0447  ?LABPROT 18.5*  ?INR 1.6*  ? ?ABG ?   ?Component Value Date/Time  ? PHART 7.369 07/29/2021 2307  ? HCO3 21.2 07/29/2021 2307  ? TCO2 22 07/29/2021 2307  ? ACIDBASEDEF 4.0 (H) 07/29/2021 2307  ? O2SAT 100 07/29/2021 2307  ? ?CBG (last 3)  ?Recent Labs  ?  08/02/21 ?0638 08/02/21 ?0658 08/02/21 ?0737  ?GLUCAP 68* 69* 107*   ? ? ?Assessment/Plan: ?S/P Procedure(s) (LRB): ?CORONARY ARTERY BYPASS GRAFTING (CABG) TIMES FOUR, USING LEFT INTERNAL MAMMARY ARTERY AND RIGHT GREATER SAPHENOUS VEIN HARVESTED ENDOSCOPICALLYY. (N/A) ?AORTIC VALVE REPLACEMENT (AVR) USING EDWARDS INSPIRIS AORTIC VALVE SIZE 23MM (N/A) ?TRANSESOPHAGEAL ECHOCARDIOGRAM (TEE) (N/A) ? ?POD 4 ? ?Hemodynamically stable in sinus rhythm in 70's. Holding off on beta blocker and amio due to bradycardia with these. Continue midodrine. ? ?Volume excess: wt is 13 lbs over preop but with elevated creat will hold off on diuretic for now. ? ?Stage 2-3 CKD. Creat up a little more today to 1.64. Hold off on diuretic and observe. ? ?DM: glucose on low side. Stop Levemir and continue SSI. ? ?Continue Coumadin for atrial fib and new aortic valve. He was on Eliquis preop for hx of AF but with new valve I think he needs Coumadin for 3 months and then could be switched back to Eliquis. ? ?Transfer to 4E and continue mobilization. ? ? ? ? LOS: 10 days  ? ? ?Gaye Pollack ?08/02/2021 ? ? ?

## 2021-08-03 DIAGNOSIS — I480 Paroxysmal atrial fibrillation: Secondary | ICD-10-CM | POA: Diagnosis not present

## 2021-08-03 DIAGNOSIS — Z951 Presence of aortocoronary bypass graft: Secondary | ICD-10-CM | POA: Diagnosis not present

## 2021-08-03 DIAGNOSIS — I350 Nonrheumatic aortic (valve) stenosis: Secondary | ICD-10-CM | POA: Diagnosis not present

## 2021-08-03 DIAGNOSIS — I214 Non-ST elevation (NSTEMI) myocardial infarction: Secondary | ICD-10-CM | POA: Diagnosis not present

## 2021-08-03 LAB — CBC
HCT: 27.3 % — ABNORMAL LOW (ref 39.0–52.0)
Hemoglobin: 9.1 g/dL — ABNORMAL LOW (ref 13.0–17.0)
MCH: 29.9 pg (ref 26.0–34.0)
MCHC: 33.3 g/dL (ref 30.0–36.0)
MCV: 89.8 fL (ref 80.0–100.0)
Platelets: 233 10*3/uL (ref 150–400)
RBC: 3.04 MIL/uL — ABNORMAL LOW (ref 4.22–5.81)
RDW: 15.9 % — ABNORMAL HIGH (ref 11.5–15.5)
WBC: 11 10*3/uL — ABNORMAL HIGH (ref 4.0–10.5)
nRBC: 0 % (ref 0.0–0.2)

## 2021-08-03 LAB — PROTIME-INR
INR: 2.2 — ABNORMAL HIGH (ref 0.8–1.2)
Prothrombin Time: 23.9 seconds — ABNORMAL HIGH (ref 11.4–15.2)

## 2021-08-03 LAB — GLUCOSE, CAPILLARY
Glucose-Capillary: 112 mg/dL — ABNORMAL HIGH (ref 70–99)
Glucose-Capillary: 147 mg/dL — ABNORMAL HIGH (ref 70–99)
Glucose-Capillary: 145 mg/dL — ABNORMAL HIGH (ref 70–99)
Glucose-Capillary: 131 mg/dL — ABNORMAL HIGH (ref 70–99)

## 2021-08-03 LAB — BASIC METABOLIC PANEL
Anion gap: 12 (ref 5–15)
BUN: 26 mg/dL — ABNORMAL HIGH (ref 8–23)
CO2: 20 mmol/L — ABNORMAL LOW (ref 22–32)
Calcium: 8 mg/dL — ABNORMAL LOW (ref 8.9–10.3)
Chloride: 102 mmol/L (ref 98–111)
Creatinine, Ser: 1.48 mg/dL — ABNORMAL HIGH (ref 0.61–1.24)
GFR, Estimated: 52 mL/min — ABNORMAL LOW (ref >60)
Glucose, Bld: 113 mg/dL — ABNORMAL HIGH (ref 70–99)
Potassium: 4 mmol/L (ref 3.5–5.1)
Sodium: 134 mmol/L — ABNORMAL LOW (ref 135–145)

## 2021-08-03 MED ORDER — WARFARIN SODIUM 1 MG PO TABS
1.0000 mg | ORAL_TABLET | Freq: Every day | ORAL | Status: DC
  Administered 2021-08-03 – 2021-08-04 (×2): 1 mg via ORAL
  Filled 2021-08-03 (×2): qty 1

## 2021-08-03 MED ORDER — LIVING WELL WITH DIABETES BOOK
Freq: Once | Status: AC
  Filled 2021-08-03: qty 1

## 2021-08-03 MED ORDER — INSULIN ASPART 100 UNIT/ML IJ SOLN
0.0000 [IU] | Freq: Three times a day (TID) | INTRAMUSCULAR | Status: DC
  Administered 2021-08-03 – 2021-08-04 (×5): 1 [IU] via SUBCUTANEOUS

## 2021-08-03 NOTE — Progress Notes (Signed)
CARDIAC REHAB PHASE I  ? ?PRE:  Rate/Rhythm: 88 SR ? ?  BP: sitting 124/69 ? ?  SaO2: 97 RA ? ?MODE:  Ambulation: 370 ft  ? ?POST:  Rate/Rhythm: 103 ST ? ?  BP: sitting 141/69  ? ?  SaO2: 99 RA ? ?Pt stood independently from recliner and walked with RW. Steady, had to rest x2 due to SOB. Overall tolerated well, return to recliner. No afib.  ? ?Discussed with pt IS (500 ml currently), sternal precautions, diet, exercise, and CRPII. Pt receptive however sts he does not know what carbs are and he has never had any DM education (DM x5 years per pt). Placed referral for DM education. If can't be done this weekend, should have outpatient education. Placed referral for [facility]. Encouraged x2 more walks and IS today. He requests to watch d/c video later.  ?[id number]  ? ?Ethelda Chick CES, ACSM ?08/03/2021 ?9:11 AM ? ? ? ? ?

## 2021-08-03 NOTE — Progress Notes (Signed)
Patient EPW pulled per protocol and as ordered. All ends intact. Patient reminded to line supine approximately one hour. Bp 118/62. Heart rate 75 on monitor. Patient call bell within reach. Dione Mccombie, Randall An ?RN  ?

## 2021-08-03 NOTE — Progress Notes (Addendum)
? ?   ?[address] ?      [place] [address] ?            [phone]   ? ?  ?5 Days Post-Op Procedure(s) (LRB): ?CORONARY ARTERY BYPASS GRAFTING (CABG) TIMES FOUR, USING LEFT INTERNAL MAMMARY ARTERY AND RIGHT GREATER SAPHENOUS VEIN HARVESTED ENDOSCOPICALLYY. (N/A) ?AORTIC VALVE REPLACEMENT (AVR) USING EDWARDS INSPIRIS AORTIC VALVE SIZE 23MM (N/A) ?TRANSESOPHAGEAL ECHOCARDIOGRAM (TEE) (N/A) ? ?Subjective: ? ?Sitting up on side of bed eating breakfast.  Overall feeling better.  He as able to move his bowels this morning.  He is ambulating independently.  He is hoping to be discharged soon. ? ?Objective: ?Vital signs in last 24 hours: ?Temp:  [97.9 ?F (36.6 ?C)-98.9 ?F (37.2 ?C)] 98.1 ?F (36.7 ?C) (04/08 0744) ?Pulse Rate:  [65-100] 89 (04/08 0744) ?Cardiac Rhythm: Normal sinus rhythm (04/07 1900) ?Resp:  [15-26] 20 (04/08 0744) ?BP: (105-168)/(64-90) 124/69 (04/08 0744) ?SpO2:  [93 %-99 %] 96 % (04/08 0744) ?Weight:  [74.6 kg] 74.6 kg (04/08 0335) ? ?Intake/Output from previous day: ?04/07 0701 - 04/08 0700 ?In: 2281.2 [P.O.:360; I.V.:1921.2] ?Out: 1225 [Urine:1225] ? ?General appearance: alert, cooperative, and no distress ?Heart: regular rate and rhythm ?Lungs: clear to auscultation bilaterally ?Abdomen: soft, non-tender; bowel sounds normal; no masses,  no organomegaly ?Extremities: edema none appreciated ?Wound: clean and dry ? ?Lab Results: ?Recent Labs  ?  08/02/21 ?[id number] 08/02/21 ?0923 08/02/21 ?1521 08/03/21 ?0141  ?WBC 14.1*  --   --  11.0*  ?HGB 9.0*   < > 8.5* 9.1*  ?HCT 26.6*   < > 25.0* 27.3*  ?PLT 190  --   --  233  ? < > = values in this interval not displayed.  ? ?BMET:  ?Recent Labs  ?  08/02/21 ?0447 08/02/21 ?0923 08/02/21 ?1403 08/02/21 ?1521 08/03/21 ?0141  ?NA 134*   < > 142 141 134*  ?K 3.9   < > 4.2 4.4 4.0  ?CL 103   < > 105  --  102  ?CO2 24  --   --   --  20*  ?GLUCOSE 65*   < > 144*  --  113*  ?BUN 32*   < > 28*  --  26*  ?CREATININE 1.64*   < > 0.50*  --  1.48*   ?CALCIUM 7.9*  --   --   --  8.0*  ? < > = values in this interval not displayed.  ?  ?PT/INR:  ?Recent Labs  ?  08/03/21 ?0141  ?LABPROT 23.9*  ?INR 2.2*  ? ?ABG ?   ?Component Value Date/Time  ? PHART 7.488 (H) 08/02/2021 1521  ? HCO3 25.9 08/02/2021 1521  ? TCO2 27 08/02/2021 1521  ? ACIDBASEDEF 4.0 (H) 07/29/2021 2307  ? O2SAT 100 08/02/2021 1521  ? ?CBG (last 3)  ?Recent Labs  ?  08/02/21 ?1541 08/02/21 ?2120 08/03/21 ?0622  ?GLUCAP 103* 121* 112*  ? ? ?Assessment/Plan: ?S/P Procedure(s) (LRB): ?CORONARY ARTERY BYPASS GRAFTING (CABG) TIMES FOUR, USING LEFT INTERNAL MAMMARY ARTERY AND RIGHT GREATER SAPHENOUS VEIN HARVESTED ENDOSCOPICALLYY. (N/A) ?AORTIC VALVE REPLACEMENT (AVR) USING EDWARDS INSPIRIS AORTIC VALVE SIZE 23MM (N/A) ?TRANSESOPHAGEAL ECHOCARDIOGRAM (TEE) (N/A) ? ?CV- brief A. Fib early this morning, currently in NSR.. he is Hypertensive with SBP in the 160s- will stop Midodrine and see how he does, if BP remains elevated will start low dose BB tomorrow ?INR 2.2, will decrease coumadin to 1 mg daily, his has EPW in place which will  need to be removed ?Pulm- no acute issues, off oxygen, continue IS ?Renal- creatinine at 1.48, no edema on exam, monitor closely ?DM-hypoglycemia, SSIP adjusted per recommendations ?Dispo- patient stable, now hypertensive.. will stop Midodrine.Marland Kitchen on coumadin for A. Fib INR is therapeutic at 2.2, however he has EPW in place that need to be removed, will give 1 mg this evening and discuss removal with Dr. Laverta Baltimore.. patient is doing well, I suspect he may be able to d/c in next 24-48 hours depending he remains stable ? ? LOS: 11 days  ? ? ?Ellwood Handler, PA-C ?08/03/2021 ? ? Chart reviewed, patient examined, agree with above. ?Feels much better today. Had BM, eating well, walking. INR 2.2. Remove pacing wires today. BP is up now so midodrine stopped. Can start low dose Lopressor tomorrow. ?

## 2021-08-03 NOTE — Progress Notes (Signed)
Patient ambulated in hallway with nursing staff. Mireyah Chervenak Jessup RN  

## 2021-08-03 NOTE — Progress Notes (Signed)
? ? ?Subjective:  ?No complaints having some PAF BP elevated  ? ?Objective:  ?Vitals:  ? 08/02/21 1941 08/02/21 2324 08/03/21 0335 08/03/21 0744  ?BP: (!) 160/73 136/65 (!) 165/79 124/69  ?Pulse: 75 100 85 89  ?Resp: 19 18 20 20   ?Temp: 98.9 ?F (37.2 ?C) 98.5 ?F (36.9 ?C) 98.4 ?F (36.9 ?C) 98.1 ?F (36.7 ?C)  ?TempSrc: Oral Oral Oral Oral  ?SpO2: 96% 95% 96% 96%  ?Weight:   74.6 kg   ?Height:      ? ? ?Intake/Output from previous day: ? ?Intake/Output Summary (Last 24 hours) at 08/03/2021 1032 ?Last data filed at 08/03/2021 0335 ?Gross per 24 hour  ?Intake 2161.17 ml  ?Output 1125 ml  ?Net 1036.17 ml  ? ? ?Physical Exam: ? ?Comfortable ?Post sternotomy with pacing wires still in ?Abdomen benign ?Lungs clear ?SEM through AVR no AR ?No edema  ? ?Lab Results: ?Basic Metabolic Panel: ?Recent Labs  ?  08/02/21 ?0447 08/02/21 ?0923 08/02/21 ?1403 08/02/21 ?1521 08/03/21 ?0141  ?NA 134*   < > 142 141 134*  ?K 3.9   < > 4.2 4.4 4.0  ?CL 103   < > 105  --  102  ?CO2 24  --   --   --  20*  ?GLUCOSE 65*   < > 144*  --  113*  ?BUN 32*   < > 28*  --  26*  ?CREATININE 1.64*   < > 0.50*  --  1.48*  ?CALCIUM 7.9*  --   --   --  8.0*  ? < > = values in this interval not displayed.  ? ?Liver Function Tests: ?No results for input(s): AST, ALT, ALKPHOS, BILITOT, PROT, ALBUMIN in the last 72 hours. ?No results for input(s): LIPASE, AMYLASE in the last 72 hours. ?CBC: ?Recent Labs  ?  08/02/21 ?0447 08/02/21 ?0923 08/02/21 ?1521 08/03/21 ?0141  ?WBC 14.1*  --   --  11.0*  ?HGB 9.0*   < > 8.5* 9.1*  ?HCT 26.6*   < > 25.0* 27.3*  ?MCV 89.9  --   --  89.8  ?PLT 190  --   --  233  ? < > = values in this interval not displayed.  ? ? ? ?Imaging: ?DG Abd 1 View ? ?Result Date: 08/01/2021 ?CLINICAL DATA:  Abdominal pain EXAM: ABDOMEN - 1 VIEW COMPARISON:  None. FINDINGS: Bowel gas pattern is nonspecific. Moderate to large amount of stool is seen in the ascending, transverse and descending colon. There is no fecal impaction in the rectosigmoid. No  definite calcific densities seen. Kidneys are mostly obscured by bowel contents limiting evaluation for small renal stones. IMPRESSION: Nonspecific bowel gas pattern. Moderate to large amount of stool is seen in the proximal colon without evidence of fecal impaction in the rectosigmoid. Electronically Signed   By: 10/01/2021 M.D.   On: 08/01/2021 12:24   ? ?Cardiac Studies: ? ECG:  ? ? Telemetry:  PAF  ? Echo: 07/24/21 EF 50% mild AS/AR ? ?Medications: ?  ? allopurinol  100 mg Oral Daily  ? aspirin EC  81 mg Oral Daily  ? atorvastatin  80 mg Oral Daily  ? ezetimibe  10 mg Oral Daily  ? ferrous fumarate-b12-vitamic C-folic acid  1 capsule Oral BID PC  ? insulin aspart  0-9 Units Subcutaneous TID WC  ? levothyroxine  75 mcg Oral Q0600  ? living well with diabetes book   Does not apply Once  ? mouth rinse  15 mL Mouth Rinse BID  ? pantoprazole  40 mg Oral Daily  ? sodium chloride flush  3 mL Intravenous Q12H  ? warfarin  1 mg Oral q1600  ? Warfarin - Physician Dosing Inpatient   Does not apply q1600  ? ?  ? sodium chloride    ? ? ?Assessment/Plan:  ? ?PAF:  known preoperatively had been on eliqusi Now transitioned to coumadin post CABG/AVR  INR 2.2 Discussed with Dr Laneta Simmers He will pull EPW;s today In NSR now Had some bradycardia and hypotension with amiodarone in unit Cr 1.48 Hct 27.3  Agree with starting beta blocker in am if BP still up D/c midodrine and can also consider low dose ARB  ?CABG/AVR 07/29/21 SVG D1, SVG OM, SVG distal RCA LIMA to LAD AVR 23 mm Edwards inspiris Resilia pericardial valve Will need post op TTE in 6-8 weesk  ?HLD:  on zetia and lipitor  ? ?Charlton Haws ?08/03/2021, 10:32 AM ? ? ? ?

## 2021-08-03 NOTE — Progress Notes (Signed)
Inpatient Diabetes Program Recommendations ? ?AACE/ADA: New Consensus Statement on Inpatient Glycemic Control (2015) ? ?Target Ranges:  Prepandial:   less than 140 mg/dL ?     Peak postprandial:   less than 180 mg/dL (1-2 hours) ?     Critically ill patients:  140 - 180 mg/dL  ? ?Lab Results  ?Component Value Date  ? GLUCAP 112 (H) 08/03/2021  ? HGBA1C 8.4 (H) 07/23/2021  ? ? ?Review of Glycemic Control ? ?Diabetes history: DM2 ?Outpatient Diabetes medications: Farxiga 10 mg QD ?Current orders for Inpatient glycemic control:  Novolog 0-9 units TID ? ?Received consult for DM education.  This DM coordinator is working remotely from home this weekend.  Called and spoke with patient over the phone.  Ordered LWWD booklet.   ? 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  Asked who he lives with and he states his mother who is in her 62's and his sister in law.  Asked if his sister in law could help him check his BG and he states she could.  Asked him to have her check a few times a week.  Asked him to log his CBGs and bring to PCP/Cards appt.   ? ?Reviewed patient's current A1c of 8.4% (average BG of 197 mg/dL).  Explained what a A1c is and what it measures. Also reviewed goal A1c with patient, importance of good glucose control @ home, and blood sugar goals. ? ?He does not drink sugary beverages.  Reviewed CHO's, The Plate Method and portion control.  He states he used to take Metformin years ago but no longer takes.   ? ?Will continue to follow while inpatient. ? ?Thank you, ?Dulce Sellar, MSN, RN ?Diabetes Coordinator ?Inpatient Diabetes Program ?[phone] (team pager from 8a-5p) ? ? ? ? ? ?

## 2021-08-04 DIAGNOSIS — Z951 Presence of aortocoronary bypass graft: Secondary | ICD-10-CM | POA: Diagnosis not present

## 2021-08-04 DIAGNOSIS — Z952 Presence of prosthetic heart valve: Secondary | ICD-10-CM | POA: Diagnosis not present

## 2021-08-04 DIAGNOSIS — I214 Non-ST elevation (NSTEMI) myocardial infarction: Secondary | ICD-10-CM | POA: Diagnosis not present

## 2021-08-04 DIAGNOSIS — I480 Paroxysmal atrial fibrillation: Secondary | ICD-10-CM | POA: Diagnosis not present

## 2021-08-04 LAB — GLUCOSE, CAPILLARY
Glucose-Capillary: 134 mg/dL — ABNORMAL HIGH (ref 70–99)
Glucose-Capillary: 132 mg/dL — ABNORMAL HIGH (ref 70–99)
Glucose-Capillary: 135 mg/dL — ABNORMAL HIGH (ref 70–99)
Glucose-Capillary: 130 mg/dL — ABNORMAL HIGH (ref 70–99)

## 2021-08-04 LAB — BASIC METABOLIC PANEL
Anion gap: 10 (ref 5–15)
BUN: 23 mg/dL (ref 8–23)
CO2: 21 mmol/L — ABNORMAL LOW (ref 22–32)
Calcium: 8.1 mg/dL — ABNORMAL LOW (ref 8.9–10.3)
Chloride: 104 mmol/L (ref 98–111)
Creatinine, Ser: 1.29 mg/dL — ABNORMAL HIGH (ref 0.61–1.24)
GFR, Estimated: >60 mL/min (ref >60)
Glucose, Bld: 125 mg/dL — ABNORMAL HIGH (ref 70–99)
Potassium: 4 mmol/L (ref 3.5–5.1)
Sodium: 135 mmol/L (ref 135–145)

## 2021-08-04 LAB — PROTIME-INR
INR: 2.3 — ABNORMAL HIGH (ref 0.8–1.2)
Prothrombin Time: 24.9 seconds — ABNORMAL HIGH (ref 11.4–15.2)

## 2021-08-04 NOTE — Progress Notes (Signed)
Mobility Specialist Progress Note  ? ? 08/04/21 1141  ?Mobility  ?Activity Ambulated with assistance in hallway  ?Level of Assistance Standby assist, set-up cues, supervision of patient - no hands on  ?Assistive Device Front wheel walker  ?Distance Ambulated (ft) 360 ft  ?Activity Response Tolerated fair  ?$Mobility charge 1 Mobility  ? ?Pt received in doorway with RN and agreeable. No complaints. Took x3 short standing rest breaks. Returned to chair with call bell in reach.  ? ?Antonio Cervantes ?Mobility Specialist  ?  ?

## 2021-08-04 NOTE — Progress Notes (Signed)
? ? ?  Subjective:  ?No complaints having some PAF BP elevated  ? ?Objective:  ?Vitals:  ? 08/03/21 1541 08/03/21 1930 08/03/21 2358 08/04/21 0242  ?BP: 130/80 130/83 (!) 97/57 135/83  ?Pulse: 81 79 72 88  ?Resp: 19 18 14 18   ?Temp: 98.4 ?F (36.9 ?C) 98.8 ?F (37.1 ?C) 99 ?F (37.2 ?C) 98.4 ?F (36.9 ?C)  ?TempSrc: Oral Oral Oral Oral  ?SpO2: 97% 93% 94% 100%  ?Weight:      ?Height:      ? ? ?Intake/Output from previous day: ?No intake or output data in the 24 hours ending 08/04/21 0718 ? ? ?Physical Exam: ? ?Comfortable ?Post sternotomy with pacing wires still in ?Abdomen benign ?Lungs clear ?SEM through AVR no AR ?No edema  ? ?Lab Results: ?Basic Metabolic Panel: ?Recent Labs  ?  08/03/21 ?0141 08/04/21 ?0129  ?NA 134* 135  ?K 4.0 4.0  ?CL 102 104  ?CO2 20* 21*  ?GLUCOSE 113* 125*  ?BUN 26* 23  ?CREATININE 1.48* 1.29*  ?CALCIUM 8.0* 8.1*  ? ?Liver Function Tests: ?No results for input(s): AST, ALT, ALKPHOS, BILITOT, PROT, ALBUMIN in the last 72 hours. ?No results for input(s): LIPASE, AMYLASE in the last 72 hours. ?CBC: ?Recent Labs  ?  08/02/21 ?0447 08/02/21 ?0923 08/02/21 ?1521 08/03/21 ?0141  ?WBC 14.1*  --   --  11.0*  ?HGB 9.0*   < > 8.5* 9.1*  ?HCT 26.6*   < > 25.0* 27.3*  ?MCV 89.9  --   --  89.8  ?PLT 190  --   --  233  ? < > = values in this interval not displayed.  ? ? ? ?Imaging: ?No results found. ? ?Cardiac Studies: ? ECG:  ? ? Telemetry:  PAF  ? Echo: 07/24/21 EF 50% mild AS/AR ? ?Medications: ?  ? allopurinol  100 mg Oral Daily  ? aspirin EC  81 mg Oral Daily  ? atorvastatin  80 mg Oral Daily  ? ezetimibe  10 mg Oral Daily  ? ferrous fumarate-b12-vitamic C-folic acid  1 capsule Oral BID PC  ? insulin aspart  0-9 Units Subcutaneous TID WC  ? levothyroxine  75 mcg Oral Q0600  ? mouth rinse  15 mL Mouth Rinse BID  ? pantoprazole  40 mg Oral Daily  ? sodium chloride flush  3 mL Intravenous Q12H  ? warfarin  1 mg Oral q1600  ? Warfarin - Physician Dosing Inpatient   Does not apply q1600  ? ?  ? sodium  chloride    ? ? ?Assessment/Plan:  ? ?PAF:  known preoperatively had been on eliqusi Now transitioned to coumadin post CABG/AVR  INR 2.3 Discussed with Dr 07/26/21 EPW pulled yesterday  today In NSR now Had some bradycardia and hypotension with amiodarone in unitBP / HR ok this am Can consider adding low dose coreg if needed  ?CABG/AVR 07/29/21 SVG D1, SVG OM, SVG distal RCA LIMA to LAD AVR 23 mm Edwards inspiris Resilia pericardial valve Will need post op TTE in 6-8 weesk  ?HLD:  on zetia and lipitor  ? ?09/28/21 ?08/04/2021, 7:18 AM ? ? ? ?

## 2021-08-04 NOTE — Progress Notes (Signed)
6 Days Post-Op Procedure(s) (LRB): ?CORONARY ARTERY BYPASS GRAFTING (CABG) TIMES FOUR, USING LEFT INTERNAL MAMMARY ARTERY AND RIGHT GREATER SAPHENOUS VEIN HARVESTED ENDOSCOPICALLYY. (N/A) ?AORTIC VALVE REPLACEMENT (AVR) USING EDWARDS INSPIRIS AORTIC VALVE SIZE (N/A) ?TRANSESOPHAGEAL ECHOCARDIOGRAM (TEE) (N/A) ?Subjective: ?No complaints. Walking well ? ?Objective: ?Vital signs in last 24 hours: ?Temp:  [98 ?F (36.7 ?C)-99 ?F (37.2 ?C)] 98.8 ?F (37.1 ?C) (04/09 0867) ?Pulse Rate:  [72-88] 72 (04/09 0737) ?Cardiac Rhythm: Normal sinus rhythm (04/08 1930) ?Resp:  [14-19] 16 (04/09 0737) ?BP: (97-141)/(51-83) 113/57 (04/09 0737) ?SpO2:  [93 %-100 %] 94 % (04/09 0737) ? ?Hemodynamic parameters for last 24 hours: ?  ? ?Intake/Output from previous day: ?No intake/output data recorded. ?Intake/Output this shift: ?No intake/output data recorded. ? ?General appearance: alert and cooperative ?Neurologic: intact ?Heart: regular rate and rhythm, S1, S2 normal, no murmur ?Lungs: clear to auscultation bilaterally ?Extremities: no edema ?Wound: incisions healing well ? ?Lab Results: ?Recent Labs  ?  08/02/21 ?0447 08/02/21 ?0923 08/02/21 ?1521 08/03/21 ?0141  ?WBC 14.1*  --   --  11.0*  ?HGB 9.0*   < > 8.5* 9.1*  ?HCT 26.6*   < > 25.0* 27.3*  ?PLT 190  --   --  233  ? < > = values in this interval not displayed.  ? ?BMET:  ?Recent Labs  ?  08/03/21 ?0141 08/04/21 ?0129  ?NA 134* 135  ?K 4.0 4.0  ?CL 102 104  ?CO2 20* 21*  ?GLUCOSE 113* 125*  ?BUN 26* 23  ?CREATININE 1.48* 1.29*  ?CALCIUM 8.0* 8.1*  ?  ?PT/INR:  ?Recent Labs  ?  08/04/21 ?0129  ?LABPROT 24.9*  ?INR 2.3*  ? ?ABG ?   ?Component Value Date/Time  ? PHART 7.488 (H) 08/02/2021 1521  ? HCO3 25.9 08/02/2021 1521  ? TCO2 27 08/02/2021 1521  ? ACIDBASEDEF 4.0 (H) 07/29/2021 2307  ? O2SAT 100 08/02/2021 1521  ? ?CBG (last 3)  ?Recent Labs  ?  08/03/21 ?1544 08/03/21 ?2120 08/04/21 ?0618  ?GLUCAP 145* 131* 134*  ? ? ?Assessment/Plan: ?S/P Procedure(s) (LRB): ?CORONARY  ARTERY BYPASS GRAFTING (CABG) TIMES FOUR, USING LEFT INTERNAL MAMMARY ARTERY AND RIGHT GREATER SAPHENOUS VEIN HARVESTED ENDOSCOPICALLYY. (N/A) ?AORTIC VALVE REPLACEMENT (AVR) USING EDWARDS INSPIRIS AORTIC VALVE SIZE (N/A) ?TRANSESOPHAGEAL ECHOCARDIOGRAM (TEE) (N/A) ? ?POD 6 ?He is hemodynamically stable in sinus rhythm. Hx of AF preop on Eliquis. We have not started beta blocker because he was bradycardic postop and developed marked bradycardia and hypotension after amio bolus. He required midodrine for several days due to hypotension. Will hold off on beta blocker for now. ? ?His wt is above preop but he has no edema at all and is making good urine with wt decreasing on its own. Will not diurese further. ? ?INR therapeutic on Coumadin. Will give 1 mg again today and see where he is tomorrow to decide on home dose.  ? ?Plan home tomorrow. ? ? LOS: 12 days  ? ? ?Antonio Cervantes Antonio Cervantes ?08/04/2021 ? ? ?

## 2021-08-05 ENCOUNTER — Inpatient Hospital Stay (HOSPITAL_COMMUNITY): Admission: RE | Admit: 2021-08-05 | Payer: Medicare Other | Source: Ambulatory Visit

## 2021-08-05 ENCOUNTER — Other Ambulatory Visit (HOSPITAL_COMMUNITY): Payer: Self-pay

## 2021-08-05 DIAGNOSIS — Z952 Presence of prosthetic heart valve: Secondary | ICD-10-CM | POA: Diagnosis not present

## 2021-08-05 DIAGNOSIS — Z951 Presence of aortocoronary bypass graft: Secondary | ICD-10-CM | POA: Diagnosis not present

## 2021-08-05 DIAGNOSIS — I255 Ischemic cardiomyopathy: Secondary | ICD-10-CM | POA: Diagnosis not present

## 2021-08-05 DIAGNOSIS — I214 Non-ST elevation (NSTEMI) myocardial infarction: Secondary | ICD-10-CM | POA: Diagnosis not present

## 2021-08-05 LAB — PROTIME-INR
INR: 2.3 — ABNORMAL HIGH (ref 0.8–1.2)
Prothrombin Time: 24.7 seconds — ABNORMAL HIGH (ref 11.4–15.2)

## 2021-08-05 LAB — GLUCOSE, CAPILLARY: Glucose-Capillary: 118 mg/dL — ABNORMAL HIGH (ref 70–99)

## 2021-08-05 MED ORDER — FE FUMARATE-B12-VIT C-FA-IFC PO CAPS
1.0000 | ORAL_CAPSULE | Freq: Two times a day (BID) | ORAL | 0 refills | Status: DC
  Filled 2021-08-05: qty 60, 30d supply, fill #0

## 2021-08-05 MED ORDER — TRAMADOL HCL 50 MG PO TABS
50.0000 mg | ORAL_TABLET | Freq: Four times a day (QID) | ORAL | 0 refills | Status: AC | PRN
  Filled 2021-08-05: qty 20, 5d supply, fill #0

## 2021-08-05 MED ORDER — WARFARIN SODIUM 1 MG PO TABS
1.0000 mg | ORAL_TABLET | Freq: Every day | ORAL | 2 refills | Status: DC
  Filled 2021-08-05: qty 30, 30d supply, fill #0

## 2021-08-05 NOTE — Progress Notes (Signed)
Heart Failure Navigator Progress Note  Assessed for Heart & Vascular TOC clinic readiness.  Patient does not meet criteria due to patient of Advance Heart Failure Team.    Annick Dimaio, BSN, RN Heart Failure Nurse Navigator Secure Chat Only   

## 2021-08-05 NOTE — TOC Transition Note (Signed)
Transition of Care (TOC) - CM/SW Discharge Note ?Donn Pierini Charity fundraiser, BSN ?Transitions of Care ?Unit 4E- RN Case Manager ?See Treatment Team for direct phone #  ? ? ?Patient Details  ?Name: Antonio Cervantes ?MRN: [id number] ?Date of Birth: [birth year] ? ?Transition of Care (TOC) CM/SW Contact:  ?Zenda Alpers, Lenn Sink, RN ?Phone Number: ?08/05/2021, 10:07 AM ? ? ?Clinical Narrative:    ?Pt stable for transition home today. Order placed for DME-RW- call made to [facility] with [employer] for DME need- RW to be delivered to room prior to discharge. ?No further TOC needs noted.  ? ? ?Final next level of care: Home/Self Care ?Barriers to Discharge: Barriers Resolved ? ? ?Patient Goals and CMS Choice ?Patient states their goals for this hospitalization and ongoing recovery are:: wants to get well ?CMS Medicare.gov Compare Post Acute Care list provided to:: Patient ?Choice offered to / list presented to : NA ? ?Discharge Placement ?  ?           ?  ? Home ?  ?  ? ?Discharge Plan and Services ?  ?Discharge Planning Services: CM Consult ?           ?DME Arranged: Walker rolling ?DME Agency: [employer] ?Date DME Agency Contacted: 08/05/21 ?Time DME Agency Contacted: 0900 ?Representative spoke with at DME Agency: Vaughan Basta ?HH Arranged: NA ?HH Agency: NA ?  ?  ?  ? ?Social Determinants of Health (SDOH) Interventions ?  ? ? ?Readmission Risk Interventions ? ?  08/05/2021  ? 10:07 AM  ?Readmission Risk Prevention Plan  ?Transportation Screening Complete  ?PCP or Specialist Appt within 5-7 Days Complete  ?Home Care Screening Complete  ?Medication Review (RN CM) Complete  ? ? ? ? ? ?

## 2021-08-05 NOTE — Progress Notes (Signed)
Heart Failure Navigator Progress Note ? ?Following this hospitalization to assess for HV TOC readiness.  ? ?EF 40-45% ?Adm. PNA , SOB ? ?Rhae Hammock, BSN, RN ?Heart Failure Nurse Navigator ?Secure Chat Only  ?

## 2021-08-05 NOTE — Care Management Important Message (Signed)
Important Message ? ?Patient Details  ?Name: Antonio Cervantes ?MRN: [id number] ?Date of Birth: 10/15/1955 ? ? ?Medicare Important Message Given:  Yes ? ? ? ? ?Renie Ora ?08/05/2021, 8:54 AM ?

## 2021-08-05 NOTE — Progress Notes (Signed)
? ?   ?[address] ?      [place] [address] ?            [phone]   ? ?  ?7 Days Post-Op Procedure(s) (LRB): ?CORONARY ARTERY BYPASS GRAFTING (CABG) TIMES FOUR, USING LEFT INTERNAL MAMMARY ARTERY AND RIGHT GREATER SAPHENOUS VEIN HARVESTED ENDOSCOPICALLYY. (N/A) ?AORTIC VALVE REPLACEMENT (AVR) USING EDWARDS INSPIRIS AORTIC VALVE SIZE (N/A) ?TRANSESOPHAGEAL ECHOCARDIOGRAM (TEE) (N/A) ?Subjective: ? ?Awake and alert, resting in bed. Says he feels well and he wants to go home. ?Independent with mobility using a rolling walker.  ?Having BM's.  ? ?Objective: ?Vital signs in last 24 hours: ?Temp:  [98 ?F (36.7 ?C)-99 ?F (37.2 ?C)] 98.4 ?F (36.9 ?C) (04/10 0735) ?Pulse Rate:  [72-80] 73 (04/10 0735) ?Cardiac Rhythm: Normal sinus rhythm (04/09 1900) ?Resp:  [13-20] 17 (04/10 0735) ?BP: (111-144)/(55-66) 115/56 (04/10 0735) ?SpO2:  [92 %-95 %] 94 % (04/10 0735) ?Weight:  [73.9 kg] 73.9 kg (04/10 0420) ? ?Hemodynamic parameters for last 24 hours: ?  ? ?Intake/Output from previous day: ?No intake/output data recorded. ?Intake/Output this shift: ?No intake/output data recorded. ? ?General appearance: alert, cooperative, and no distress ?Neurologic: intact ?Heart: RRR, no significant arrhythmias on monitor.  ?Lungs: breath sounds are clear, SaO2 94-95% on RA.   ?Abdomen: soft, no tenderness.  ?Extremities: No peripheral edema.  ?Wound: the sternotomy incision and EVH incision are intact and dry. Sutures in place at the CT sites.  ? ?Lab Results: ?Recent Labs  ?  08/02/21 ?1521 08/03/21 ?0141  ?WBC  --  11.0*  ?HGB 8.5* 9.1*  ?HCT 25.0* 27.3*  ?PLT  --  233  ? ?BMET:  ?Recent Labs  ?  08/03/21 ?0141 08/04/21 ?0129  ?NA 134* 135  ?K 4.0 4.0  ?CL 102 104  ?CO2 20* 21*  ?GLUCOSE 113* 125*  ?BUN 26* 23  ?CREATININE 1.48* 1.29*  ?CALCIUM 8.0* 8.1*  ?  ?PT/INR:  ?Recent Labs  ?  08/05/21 ?0342  ?LABPROT 24.7*  ?INR 2.3*  ? ?ABG ?   ?Component Value Date/Time  ? PHART 7.488 (H) 08/02/2021 1521  ? HCO3 25.9  08/02/2021 1521  ? TCO2 27 08/02/2021 1521  ? ACIDBASEDEF 4.0 (H) 07/29/2021 2307  ? O2SAT 100 08/02/2021 1521  ? ?CBG (last 3)  ?Recent Labs  ?  08/04/21 ?1622 08/04/21 ?2122 08/05/21 ?1761  ?GLUCAP 135* 130* 118*  ? ? ?Assessment/Plan: ?S/P Procedure(s) (LRB): ?CORONARY ARTERY BYPASS GRAFTING (CABG) TIMES FOUR, USING LEFT INTERNAL MAMMARY ARTERY AND RIGHT GREATER SAPHENOUS VEIN HARVESTED ENDOSCOPICALLYY. (N/A) ?AORTIC VALVE REPLACEMENT (AVR) USING EDWARDS INSPIRIS AORTIC VALVE SIZE (N/A) ?TRANSESOPHAGEAL ECHOCARDIOGRAM (TEE) (N/A) ? ?-POD7 CABG x 4 and AVR with a tissue prosthesis after presenting with NSTEMI, reduced EF. . Making a progressive recovery. Stable VS and cardiac rhythm, off midodrine. Good mobility. On ASA and statin.  ? ?-History of PAF- none recorded on monitor past 24 hours. Had bradycardia and hypotension with amiodarone bolus early post op so currently on no anti-arrhythmics or BB. On Coumadin with INR 2.3. ? ?-Mild expected acute blood loss anemia- Stable and tolerating well. On Fe++ replacement.  ? ?-GI - tolerating POs and having appropriate bowel function.  ? ?-RENAL- Mild renal insuffiencey, present pre-op and stable ? ?-ENDO-type 2 DM and hypothyroidism- On Farxiga prior to admission, plan to resume at discharge. Synthroid resumed.  ? ?-Disposition- discharge planned for today. Rolling walker arranged per PT recs. Follow up arranged with Dr. Laneta Simmers and the advanced heart  failure team. Will need to arrange for PT/INR with the Coumadin Clinic.  ? ? LOS: 13 days  ? ? ?Leary Roca, PA-C ?[phone] ?08/05/2021 ? ? ?

## 2021-08-05 NOTE — Progress Notes (Addendum)
CARDIOLOGY RECOMMENDATIONS: ? ?Discharge is anticipated in the next 48 hours. ?Recommendations for medications and follow up: ? ?Discharge Medications: ?Continue medications as they are currently listed in the [facility]. On ASA 81mg , statin, Zetia. No BB with hypotension and bradycardia.  ?-- On coumadin for PAF, coumadin clinic 4/12 2:30pm ? ?Follow Up: ?The patient's Primary Cardiologist is 6/12 III, MD  ?Follow up in the office in 2 week(s). 4/25 at 1:30pm in the AHF clinic ? ?Signed,  ?5/25, NP  ?9:53 AM 08/05/2021  ?[facility] ? ? ?Patient seen and examined. Agree with assessment and plan.  Day 7 status post CABG revascularization surgery x4 with LIMA to LAD, SVG to diagonal, SVG to OM, SVG to distal RCA and AVR using a 23 mm Edwards Inspira's Resilia pericardial valve.  Presently he feels well.  No recurrent atrial fibrillation for which he had received amiodarone bolus early postop.  Maintaining sinus rhythm with heart rate around 90 presently.  No JVD.  Lungs are clear.  Rhythm is regular.  Well-healed.  No significant edema.  Plan for discharge later today with follow-up evaluations as above ? ? ?10/05/2021, MD, FACC ?08/05/2021 ?11:22 AM  ? ?

## 2021-08-06 ENCOUNTER — Telehealth (HOSPITAL_COMMUNITY): Payer: Self-pay

## 2021-08-06 NOTE — Telephone Encounter (Signed)
Per phase I cardiac rehab, fax cardiac rehab referral to [place] cardiac rehab. 

## 2021-08-07 ENCOUNTER — Ambulatory Visit: Payer: Medicare Other | Admitting: *Deleted

## 2021-08-07 DIAGNOSIS — Z952 Presence of prosthetic heart valve: Secondary | ICD-10-CM

## 2021-08-07 DIAGNOSIS — I480 Paroxysmal atrial fibrillation: Secondary | ICD-10-CM | POA: Diagnosis not present

## 2021-08-07 DIAGNOSIS — Z5181 Encounter for therapeutic drug level monitoring: Secondary | ICD-10-CM | POA: Diagnosis not present

## 2021-08-07 LAB — POCT INR: INR: 1.3 — AB (ref 2.0–3.0)

## 2021-08-07 NOTE — Patient Instructions (Signed)
Description   ? START taking warfarin 2 tablets daily except for 1 tablet on Sundays, Tuesdays and Thursdays. Recheck INR in 1 week. Coumadin Clinic [phone].  ? ?A full discussion of the nature of anticoagulants has been carried out.  A benefit risk analysis has been presented to the patient, so that they understand the justification for choosing anticoagulation at this time. The need for frequent and regular monitoring, precise dosage adjustment and compliance is stressed.  Side effects of potential bleeding are discussed.  The patient should avoid any OTC items containing aspirin or ibuprofen, and should avoid great swings in general diet.  Avoid alcohol consumption.  Call if any signs of abnormal bleeding.   ?  ? ? ?

## 2021-08-09 ENCOUNTER — Encounter (INDEPENDENT_AMBULATORY_CARE_PROVIDER_SITE_OTHER): Payer: Self-pay

## 2021-08-09 DIAGNOSIS — Z4802 Encounter for removal of sutures: Secondary | ICD-10-CM

## 2021-08-12 ENCOUNTER — Ambulatory Visit: Payer: Medicare Other | Admitting: *Deleted

## 2021-08-12 DIAGNOSIS — Z5181 Encounter for therapeutic drug level monitoring: Secondary | ICD-10-CM

## 2021-08-12 DIAGNOSIS — Z952 Presence of prosthetic heart valve: Secondary | ICD-10-CM | POA: Diagnosis not present

## 2021-08-12 DIAGNOSIS — I4891 Unspecified atrial fibrillation: Secondary | ICD-10-CM

## 2021-08-12 LAB — POCT INR: INR: 1.4 — AB (ref 2.0–3.0)

## 2021-08-12 NOTE — Patient Instructions (Signed)
Description   ?Today and tomorrow take 2.5 tablets then START taking warfarin 2 tablets daily except for 1 tablet on Sundays and Thursdays. Recheck INR in 1 week. Coumadin Clinic [phone].  ? ?  ?  ?

## 2021-08-13 DIAGNOSIS — N183 Chronic kidney disease, stage 3 unspecified: Secondary | ICD-10-CM | POA: Diagnosis not present

## 2021-08-13 DIAGNOSIS — I214 Non-ST elevation (NSTEMI) myocardial infarction: Secondary | ICD-10-CM | POA: Diagnosis not present

## 2021-08-13 DIAGNOSIS — I2510 Atherosclerotic heart disease of native coronary artery without angina pectoris: Secondary | ICD-10-CM | POA: Diagnosis not present

## 2021-08-13 DIAGNOSIS — E782 Mixed hyperlipidemia: Secondary | ICD-10-CM | POA: Diagnosis not present

## 2021-08-13 DIAGNOSIS — I480 Paroxysmal atrial fibrillation: Secondary | ICD-10-CM | POA: Diagnosis not present

## 2021-08-13 DIAGNOSIS — E1122 Type 2 diabetes mellitus with diabetic chronic kidney disease: Secondary | ICD-10-CM | POA: Diagnosis not present

## 2021-08-19 NOTE — Progress Notes (Signed)
? ?ADVANCED HF CLINIC NOTE ? ?Primary Care: Jeannene Patella, PA ?HF Cardiologist: Dr. Haroldine Laws ? ?HPI: ?Antonio Cervantes 66 y.o. male w/ T2DM, remote tobacco use, new diagnosis of systolic heart failure/iCM, CAD, AF, and HLD ? ?Admitted 1/23 for NSTEMI and found to be in acute CHF and afib w/ RVR, of unknown duration but spontaneously converted back to NSR. Echo with severely reduced LVEF, 20-25%, RV normal. LHC showed severe MVCAD, however given diffuse, distal vessel disease, there were no good options for revascularization (Diffuse inoperable CAD, targets not amendable to PCI). RHC showed normal filling pressures and normal CO  5.4 L/min, cardiac index 2.88. Medical therapy for CAD was recommended. Placed on ASA, statin and ? blocker. GDMT limited by soft BP and renal insuffiency, SCr ~1.5 during hospitalization (baseline unknown). Eliquis started for Afib. Referred to [facility] clinic.  ?  ?Of note, there was also concern for potential aortic stenosis base on echo interpretation, "degree of AS hard to judge due to severe decrease in LV function DVI 0.55 AVA 2 cm2 but morphology and gradients suggest AS could be moderate" . ?  ?Seen in [facility] 2/23, stable NYHA I-II symptoms. GDMT titrated. Follow up in AHF 3/23 with stable NYHA II symptoms, euvolemic. Started on low-dose losartan. ? ?Re-admitted 3/23 with NSTEMI. Underwent cMRI showing LVEF 47%, normal RVEF, viable multi-vessel disease. Moderate AI. CVTS consulted and underwent CABG x 4 and aortic valve replacement. Course complicated by persistent AF despite amiodarone and started on Coumadin (plan to continue for 3 months then switch to Eliquis). Arlyce Harman, beta blocker, and Lasix stopped with low BP and bradycardia. Discharged home, weight 163 lbs. ? ?Today he returns for post hospital HF follow up with daughter-in-law. Overall feeling fine. He has no SOB walking on flat ground or with ADLs. Compliant with lifting restrictions. Denies palpitations, abnormal bleeding,  CP, dizziness, edema, or PND/Orthopnea. Appetite ok. No fever or chills. Weight at home 148 pounds. Taking all medications. Retired from [employer]. Lives with his mother and helps care for her. ?  ?Cardiac Testing  ?- cMRI (3/23): LVEF 47%, normal RVEF, mild to moderate AI, study suggests viable multi-vessel disease. ? ?- Echo (3/23): EF 50%, mild LVH, RV ok, mild AI ? ?- Echo (1/23): EF 20-25%, severe LV dysfunction, mild LVH, RV ok, mild MR, degree of AS hard to judge due to severe decrease in LV function DVI 0.55 AVA 2 cm2 but morphology and gradients suggest AS could be moderate .  ? ?- LHC/RHC (05/21/21): ?  Prox LAD to Mid LAD lesion is 75% stenosed. ?  Dist LAD lesion is 80% stenosed. ?  1st Diag lesion is 80% stenosed. ?  Ost RCA to Mid RCA lesion is 99% stenosed. ?  2nd Mrg lesion is 99% stenosed. ?  Dist Cx lesion is 90% stenosed. ?  Prox Cx to Mid Cx lesion is 75% stenosed. ?  LV end diastolic pressure is normal. ?  There is no aortic valve stenosis. ?  ?Severe, multivessel coronary artery disease.   ?Normal LVEDP. ?Given diffuse, distal vessel disease, there are no good options for revascularization.  ? ?Right heart pressures: ?RA mean 2 ?PA pressure 26/10 mean 17 ?PCWP mean 6 ?CO/CI 5.4/2.88 ([place]) ? ?ROS: All systems reviewed and negative except as per HPI.  ? ?Past Medical History:  ?Diagnosis Date  ? Diabetes mellitus without complication ([place])   ? Diabetic peripheral neuropathy associated with type 2 diabetes mellitus ([place])   ? ?Current Outpatient Medications  ?Medication  Sig Dispense Refill  ? acetaminophen (TYLENOL) 325 MG tablet Take 2 tablets (650 mg total) by mouth every 6 (six) hours as needed. 30 tablet 0  ? allopurinol (ZYLOPRIM) 100 MG tablet Take 100 mg by mouth daily.    ? aspirin 81 MG EC tablet Take 1 tablet (81 mg total) by mouth daily. Swallow whole. 30 tablet 11  ? atorvastatin (LIPITOR) 80 MG tablet Take 1 tablet (80 mg total) by mouth daily. 30 tablet 0  ? dapagliflozin  propanediol (FARXIGA) 10 MG TABS tablet Take 1 tablet (10 mg total) by mouth daily. 30 tablet 2  ? ezetimibe (ZETIA) 10 MG tablet Take 1 tablet (10 mg total) by mouth daily. 90 tablet 3  ? ferrous Q000111Q C-folic acid (TRINSICON / FOLTRIN) capsule Take 1 capsule by mouth 2 (two) times daily after a meal. For 1 month. 60 capsule 0  ? levothyroxine (SYNTHROID) 75 MCG tablet Take 1 tablet (75 mcg total) by mouth daily at 6 (six) AM. 30 tablet 0  ? warfarin (COUMADIN) 1 MG tablet Take 1 tablet (1 mg total) by mouth daily at 4 PM. 30 tablet 2  ? ?No current facility-administered medications for this encounter.  ? ?No Known Allergies ? ?Social History  ? ?Socioeconomic History  ? Marital status: Married  ?  Spouse name: Not on file  ? Number of children: 1  ? Years of education: Not on file  ? Highest education level: 10th grade  ?Occupational History  ? Occupation: retired  ?Tobacco Use  ? Smoking status: Former  ?  Packs/day: 1.50  ?  Years: 15.00  ?  Pack years: 22.50  ?  Types: Cigarettes  ?  Quit date: 12  ?  Years since quitting: 40.3  ? Smokeless tobacco: Never  ?Vaping Use  ? Vaping Use: Never used  ?Substance and Sexual Activity  ? Alcohol use: No  ? Drug use: No  ? Sexual activity: Not on file  ?Other Topics Concern  ? Not on file  ?Social History Narrative  ? Not on file  ? ?Social Determinants of Health  ? ?Financial Resource Strain: Low Risk   ? Difficulty of Paying Living Expenses: Not very hard  ?Food Insecurity: No Food Insecurity  ? Worried About Charity fundraiser in the Last Year: Never true  ? Ran Out of Food in the Last Year: Never true  ?Transportation Needs: No Transportation Needs  ? Lack of Transportation (Medical): No  ? Lack of Transportation (Non-Medical): No  ?Physical Activity: Not on file  ?Stress: Not on file  ?Social Connections: Not on file  ?Intimate Partner Violence: Not on file  ? ?No family history on file. ? ?Wt Readings from Last 3 Encounters:  ?08/20/21 67.5 kg   ?08/05/21 73.9 kg  ?07/09/21 71.9 kg  ? ?BP 118/82   Pulse 90   Wt 67.5 kg   SpO2 95%   BMI 26.36 kg/m?  ? ?PHYSICAL EXAM: ?General:  NAD. No resp difficulty, pale ?HEENT: Normal ?Neck: Supple. No JVD. Carotids 2+ bilat; no bruits. No lymphadenopathy or thryomegaly appreciated. ?Cor: PMI nondisplaced. Regular rate & rhythm. No rubs, gallops or murmurs. ?Lungs: Clear, decrease LLL ?Abdomen: Soft, nontender, nondistended. No hepatosplenomegaly. No bruits or masses. Good bowel sounds. ?Extremities: No cyanosis, clubbing, rash, edema ?Neuro: Alert & oriented x 3, cranial nerves grossly intact. Moves all 4 extremities w/o difficulty. Affect pleasant. ? ?ECG (personally reviewed): NSR, + LVH 89 bpm ? ?ASSESSMENT & PLAN: ?1. Chronic Systolic Heart  Failure ?- Ischemic CM.  ?- Echo 1/23 EF 20-25%, RV ok ?- LHC (1/23): w/ severe MVCAD. RHC w/ normal filling pressures and preserved CO 5.4 L/min, cardiac index 2.88 ?- Echo (3/23): improved EF 50%, mild LVH, RV ok, mild AI ?- cMRI( 3/23): LVEF 47%, RVEF 48%, viable multi-vessel disease, moderate AI ?- s/p CABG 4/23 ?- NYHA Class II today. Euvolemic on exam. ?- Start Toprol XL 12.5 mg daily. ?- Continue Farxiga 10 mg daily.  ?- Plan to add back spiro and losartan/Entresto as able. ?- He has follow up echo arranged 10/18/21.  ?- BMET today. ?  ?2. CAD ?- NSTEMI 1/23, LHC w/ severe, diffuse MVCAD--> medically managed. ?- NSTEMI 3/23, s/p CABG x 4, LIMA to LAD, SVG to diagonal, SVG to OM, SVG to distal RCA and AVR ?- Stable w/o CP. ?- Continue ASA 81 + warfarin + atorva 80 daily. ?- Start beta blocker as above. ?- CR in [facility] when cleared by surgery. ?- INR followed by Coumadin Clinic, last 1.2 today. ?  ?3. PAF ?- NSR on ECG today. ?- Continue Coumadin (AVR; plan to switch back to Eliquis in 3 months ~7/23). Denies abnormal bleeding.  ?- Start Toprol XL 12.5 mg daily.  ?- CBC today. ?  ?4. Aortic Stenosis  ?- s/p aortic valve replacement ?- On Coumadin. INR with Coumadin  Clinic. ? ?5. CKD IIIa ?- Baseline SCr ~1.5 ?- Continue Farxiga. ?- Labs today. ?  ?6. Type 2DM ?- Hgb A1c 7.3  ?- Continue SGLT2i. ?  ?7. HLD w/ LDL Goal < 70  ?- LP 3/23 LDL 32. ?- Continue atorva 80 mg ?  ?8. Remote

## 2021-08-20 ENCOUNTER — Encounter (HOSPITAL_COMMUNITY): Payer: Self-pay

## 2021-08-20 ENCOUNTER — Ambulatory Visit (HOSPITAL_COMMUNITY)
Admission: RE | Admit: 2021-08-20 | Discharge: 2021-08-20 | Disposition: A | Discharge status: Home / Self Care | Payer: Medicare Other | Source: Ambulatory Visit | Attending: Family Medicine | Admitting: Family Medicine

## 2021-08-20 ENCOUNTER — Ambulatory Visit: Payer: Medicare Other | Admitting: Pharmacist

## 2021-08-20 VITALS — BP 118/82 | HR 90 | Wt 148.8 lb

## 2021-08-20 DIAGNOSIS — I350 Nonrheumatic aortic (valve) stenosis: Secondary | ICD-10-CM | POA: Diagnosis not present

## 2021-08-20 DIAGNOSIS — Z952 Presence of prosthetic heart valve: Secondary | ICD-10-CM | POA: Diagnosis not present

## 2021-08-20 DIAGNOSIS — E785 Hyperlipidemia, unspecified: Secondary | ICD-10-CM

## 2021-08-20 DIAGNOSIS — I4819 Other persistent atrial fibrillation: Secondary | ICD-10-CM | POA: Insufficient documentation

## 2021-08-20 DIAGNOSIS — I5022 Chronic systolic (congestive) heart failure: Secondary | ICD-10-CM | POA: Diagnosis not present

## 2021-08-20 DIAGNOSIS — N183 Chronic kidney disease, stage 3 unspecified: Secondary | ICD-10-CM | POA: Diagnosis not present

## 2021-08-20 DIAGNOSIS — I255 Ischemic cardiomyopathy: Secondary | ICD-10-CM | POA: Diagnosis not present

## 2021-08-20 DIAGNOSIS — Z79899 Other long term (current) drug therapy: Secondary | ICD-10-CM | POA: Insufficient documentation

## 2021-08-20 DIAGNOSIS — N1831 Chronic kidney disease, stage 3a: Secondary | ICD-10-CM | POA: Diagnosis not present

## 2021-08-20 DIAGNOSIS — Z7984 Long term (current) use of oral hypoglycemic drugs: Secondary | ICD-10-CM | POA: Diagnosis not present

## 2021-08-20 DIAGNOSIS — E039 Hypothyroidism, unspecified: Secondary | ICD-10-CM | POA: Insufficient documentation

## 2021-08-20 DIAGNOSIS — I2510 Atherosclerotic heart disease of native coronary artery without angina pectoris: Secondary | ICD-10-CM | POA: Insufficient documentation

## 2021-08-20 DIAGNOSIS — I252 Old myocardial infarction: Secondary | ICD-10-CM | POA: Diagnosis not present

## 2021-08-20 DIAGNOSIS — I480 Paroxysmal atrial fibrillation: Secondary | ICD-10-CM

## 2021-08-20 DIAGNOSIS — Z951 Presence of aortocoronary bypass graft: Secondary | ICD-10-CM | POA: Diagnosis not present

## 2021-08-20 DIAGNOSIS — Z7982 Long term (current) use of aspirin: Secondary | ICD-10-CM | POA: Insufficient documentation

## 2021-08-20 DIAGNOSIS — Z7901 Long term (current) use of anticoagulants: Secondary | ICD-10-CM | POA: Diagnosis not present

## 2021-08-20 DIAGNOSIS — E1122 Type 2 diabetes mellitus with diabetic chronic kidney disease: Secondary | ICD-10-CM | POA: Diagnosis not present

## 2021-08-20 DIAGNOSIS — Z87891 Personal history of nicotine dependence: Secondary | ICD-10-CM | POA: Insufficient documentation

## 2021-08-20 LAB — BASIC METABOLIC PANEL
Anion gap: 9 (ref 5–15)
BUN: 16 mg/dL (ref 8–23)
CO2: 25 mmol/L (ref 22–32)
Calcium: 8.8 mg/dL — ABNORMAL LOW (ref 8.9–10.3)
Chloride: 104 mmol/L (ref 98–111)
Creatinine, Ser: 1.18 mg/dL (ref 0.61–1.24)
GFR, Estimated: >60 mL/min (ref >60)
Glucose, Bld: 178 mg/dL — ABNORMAL HIGH (ref 70–99)
Potassium: 4.3 mmol/L (ref 3.5–5.1)
Sodium: 138 mmol/L (ref 135–145)

## 2021-08-20 LAB — CBC
HCT: 35.4 % — ABNORMAL LOW (ref 39.0–52.0)
Hemoglobin: 11.5 g/dL — ABNORMAL LOW (ref 13.0–17.0)
MCH: 29.8 pg (ref 26.0–34.0)
MCHC: 32.5 g/dL (ref 30.0–36.0)
MCV: 91.7 fL (ref 80.0–100.0)
Platelets: 484 10*3/uL — ABNORMAL HIGH (ref 150–400)
RBC: 3.86 MIL/uL — ABNORMAL LOW (ref 4.22–5.81)
RDW: 14.9 % (ref 11.5–15.5)
WBC: 10.6 10*3/uL — ABNORMAL HIGH (ref 4.0–10.5)
nRBC: 0 % (ref 0.0–0.2)

## 2021-08-20 LAB — POCT INR: INR: 1.2 — AB (ref 2.0–3.0)

## 2021-08-20 MED ORDER — METOPROLOL SUCCINATE ER 25 MG PO TB24: 12.5000 mg | ORAL_TABLET | Freq: Every day | ORAL | 2 refills | Status: DC

## 2021-08-20 NOTE — Patient Instructions (Signed)
Description   ?Take an extra 2 tablets today and take 3 tablets tomorrow, then START taking warfarin 2 tablets daily except 3mg  on Sundays and Thursdays. Recheck INR in 1 week. Coumadin Clinic [phone].  ? ?  ? ? ?

## 2021-08-20 NOTE — Patient Instructions (Addendum)
Thank you for coming in today ? ?Labs were done today, if any labs are abnormal the clinic will call you ?No news is good news  ? ?START Toprol XL 12.5 mg 1/2 tablet daily  ? ?Your physician recommends that you schedule a follow-up appointment in:  ?3 week in clinic  ?Keep follow up appointment with Dr. Gala Romney ? ?At the [facility], you and your health needs are our priority. As part of our continuing mission to provide you with exceptional heart care, we have created designated Provider Care Teams. These Care Teams include your primary Cardiologist (physician) and Advanced Practice Providers (APPs- Physician Assistants and Nurse Practitioners) who all work together to provide you with the care you need, when you need it.  ? ?You may see any of the following providers on your designated Care Team at your next follow up: ?Dr Arvilla Meres ?Dr Marca Ancona ?Tonye Becket, NP ?Robbie Lis, PA ?Jessica Milford,NP ?Anna Genre, PA ?Karle Plumber, PharmD ? ? ?Please be sure to bring in all your medications bottles to every appointment.  ? ?If you have any questions or concerns before your next appointment please send [place] a message through [place] or call our office at [phone].   ? ?TO LEAVE A MESSAGE FOR THE NURSE SELECT OPTION 2, PLEASE LEAVE A MESSAGE INCLUDING: ?YOUR NAME ?DATE OF BIRTH ?CALL BACK NUMBER ?REASON FOR CALL**this is important as we prioritize the call backs ? ?YOU WILL RECEIVE A CALL BACK THE SAME DAY AS LONG AS YOU CALL BEFORE 4:00 PM ? ?

## 2021-08-21 ENCOUNTER — Other Ambulatory Visit: Payer: Self-pay | Admitting: *Deleted

## 2021-08-21 MED ORDER — WARFARIN SODIUM 1 MG PO TABS: ORAL_TABLET | ORAL | 1 refills | Status: DC

## 2021-08-26 ENCOUNTER — Ambulatory Visit (INDEPENDENT_AMBULATORY_CARE_PROVIDER_SITE_OTHER): Payer: Medicare Other | Admitting: *Deleted

## 2021-08-26 DIAGNOSIS — Z5181 Encounter for therapeutic drug level monitoring: Secondary | ICD-10-CM | POA: Diagnosis not present

## 2021-08-26 DIAGNOSIS — I480 Paroxysmal atrial fibrillation: Secondary | ICD-10-CM

## 2021-08-26 DIAGNOSIS — Z952 Presence of prosthetic heart valve: Secondary | ICD-10-CM | POA: Diagnosis not present

## 2021-08-26 LAB — POCT INR: INR: 1.3 — AB (ref 2.0–3.0)

## 2021-08-26 NOTE — Patient Instructions (Signed)
Description   ?Take another tablet of warfarin today for a total of 3 tablets today and take 4 tablets tomorrow, then START taking warfarin 3 tablets daily except 2mg  on Monday and Friday. Recheck INR in 1 week. Coumadin Clinic [phone].  ? ?  ?  ?

## 2021-08-27 ENCOUNTER — Other Ambulatory Visit: Payer: Self-pay | Admitting: Surgery

## 2021-08-27 DIAGNOSIS — Z951 Presence of aortocoronary bypass graft: Secondary | ICD-10-CM

## 2021-08-27 NOTE — Progress Notes (Signed)
Advanced Heart Failure Clinic Note   Primary Care: Jeannene Patella, PA HF Cardiologist: Dr. Haroldine Laws  HPI:  Antonio Cervantes 66 y.o. male w/ T2DM, remote tobacco use, new diagnosis of systolic heart failure/iCM, CAD, AF, and HLD.   Admitted 04/2021 for NSTEMI and found to be in acute CHF and afib w/ RVR, of unknown duration but spontaneously converted back to NSR. Echo with severely reduced LVEF, 20-25%, RV normal. LHC showed severe MVCAD, however given diffuse, distal vessel disease, there were no good options for revascularization (Diffuse inoperable CAD, targets not amendable to PCI). RHC showed normal filling pressures and normal CO  5.4 L/min, cardiac index 2.88. Medical therapy for CAD was recommended. Placed on ASA, statin and ? blocker. GDMT limited by soft BP and renal insuffiency, SCr ~1.5 during hospitalization (baseline unknown). Eliquis started for Afib. Referred to [facility] clinic.    Of note, there was also concern for potential aortic stenosis base on echo interpretation, "degree of AS hard to judge due to severe decrease in LV function DVI 0.55 AVA 2 cm2 but morphology and gradients suggest AS could be moderate" .   Seen in TOC 05/2021, stable NYHA I-II symptoms. GDMT titrated. Followed up in AHF 06/2021 with stable NYHA II symptoms, euvolemic. Started on low-dose losartan.   Re-admitted 06/2021 with NSTEMI. Underwent cMRI showing LVEF 47%, normal RVEF, viable multi-vessel disease. Moderate AI. CVTS consulted and underwent CABG x 4 and aortic valve replacement. Course complicated by persistent AF despite amiodarone and started on Coumadin (plan to continue for 3 months then switch to Eliquis). Spironolactone, beta blocker, and Lasix stopped with low BP and bradycardia. Discharged home, weight 163 lbs.   Returned to AHF Clinic on 08/20/21 for post hospital HF follow up with daughter-in-law. Overall was feeling fine. He had no SOB walking on flat ground or with ADLs. Compliant with  lifting restrictions. Denied palpitations, abnormal bleeding, CP, dizziness, edema, or PND/Orthopnea. Appetite was ok. No fever or chills. Weight at home was 148 pounds. Reported taking all medications. Retired from [employer]. Lives with his mother and helps care for her.  Today he returns to HF clinic for pharmacist medication titration. At last visit with APP, metoprolol succinate 12.5 mg daily was initiated. Overall he is feeling well today. Main complaint is poor vision, he has glaucoma. No dizziness, lightheadedness, CP or palpitations. No SOB/DOE. Weight at home has been stable. Has not needed to use any PRN Lasix since 3 weeks ago. No LEE, PND or orthopnea. Taking all medications as prescribed and tolerating all medications.    HF Medications: Metoprolol succinate 12.5 mg daily Farxiga 10 mg daily  Has the patient been experiencing any side effects to the medications prescribed?  no  Does the patient have any problems obtaining medications due to transportation or finances?   No - [facility] Medicare insurance  Understanding of regimen: good Understanding of indications: good Potential of compliance: good Patient understands to avoid NSAIDs. Patient understands to avoid decongestants.    Pertinent Lab Values: 09/12/21: Serum creatinine 1.20, BUN 44, Potassium 4.4, Sodium 140  Vital Signs: Weight: 147.6 lbs (last clinic weight: 148.8 lbs) Blood pressure: 136/74  Heart rate: 80   Assessment/Plan: 1. 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  - Echo 04/2021 EF 20-25%, RV ok - LHC (04/2021): w/ severe MVCAD. RHC w/ normal filling pressures and preserved CO 5.4 L/min, cardiac index 2.88 - Echo (06/2021): improved EF 50%, mild LVH, RV ok, mild AI - cMRI(  06/2021): LVEF 47%, RVEF 48%, viable multi-vessel disease, moderate AI - s/p CABG 07/2021 - NYHA Class II. Euvolemic on exam. - Increase metoprolol succinate to 25 mg daily. - Start losartan 12.5 mg daily. Repeat BMET in 2 week.  -  Continue Farxiga 10 mg daily.  - He has follow up echo arranged 10/18/21.    2. CAD - NSTEMI 04/2021, LHC w/ severe, diffuse MVCAD--> medically managed. - NSTEMI 06/2021, s/p CABG x 4, LIMA to LAD, SVG to diagonal, SVG to OM, SVG to distal RCA and AVR - Stable w/o CP. - Continue ASA 81 + warfarin + atorva 80 daily. - increase metoprolol succinate as above - CR in [facility] when cleared by surgery. - INR followed by Coumadin Clinic   3. PAF - Continue Coumadin (AVR; plan to switch back to Eliquis in 3 months ~10/2021). Denies abnormal bleeding.  - increase metoprolol succinate as above   4. Aortic Stenosis  - s/p aortic valve replacement - On Coumadin. INR with Coumadin Clinic.   5. CKD IIIa - Continue Farxiga.   6. Type 2DM - Hgb A1c 7.3  - Continue SGLT2i.   7. HLD w/ LDL Goal < 70  - LP 06/2021 LDL 32. - Continue atorvastatin 80 mg daily   8. Remote Tobacco Use - Quit 40 years ago.   9. Hypothyroidism  - On levothyroxine - Management per PCP.   Follow up 4 weeks with Dr. Julieanne Cotton, PharmD, BCPS, [facility], [place] [facility] Pharmacist [phone]

## 2021-08-28 ENCOUNTER — Other Ambulatory Visit: Payer: Self-pay | Admitting: *Deleted

## 2021-08-28 ENCOUNTER — Ambulatory Visit (INDEPENDENT_AMBULATORY_CARE_PROVIDER_SITE_OTHER): Payer: Self-pay | Admitting: Physician Assistant

## 2021-08-28 ENCOUNTER — Ambulatory Visit
Admission: RE | Admit: 2021-08-28 | Discharge: 2021-08-28 | Disposition: A | Discharge status: Home / Self Care | Payer: Medicare Other | Source: Ambulatory Visit | Attending: Surgery | Admitting: Surgery

## 2021-08-28 VITALS — BP 134/77 | HR 70 | Resp 20 | Ht 63.0 in | Wt 152.0 lb

## 2021-08-28 DIAGNOSIS — Z952 Presence of prosthetic heart valve: Secondary | ICD-10-CM

## 2021-08-28 DIAGNOSIS — Z951 Presence of aortocoronary bypass graft: Secondary | ICD-10-CM | POA: Diagnosis not present

## 2021-08-28 DIAGNOSIS — J90 Pleural effusion, not elsewhere classified: Secondary | ICD-10-CM | POA: Diagnosis not present

## 2021-08-28 NOTE — Progress Notes (Signed)
? ?   ?[address] ?      [place] [address] ?            [phone]   ? ?  ? ?HPI: ?Patient returns for routine postoperative follow-up having undergone CABGx 4 and Aortic Valve Replacement on 07/29/2021. The patient's early postoperative recovery while in the hospital was notable for Persistent Atrial Fibrillation.  Due to the newly placed Aortic Valve and Atrial Fibrillation he was started on coumadin for stroke prophylaxis.  Since hospital discharge the patient reports he is doing well.  He is ambulating without difficulty.  He is no longer taking pain medication.  He is interested in participating in cardiac rehab down in [place].  He is unable to drive currently due to cataracts.  He needs to have surgery for this and is requesting clearance. ? ?Current Outpatient Medications  ?Medication Sig Dispense Refill  ? acetaminophen (TYLENOL) 325 MG tablet Take 2 tablets (650 mg total) by mouth every 6 (six) hours as needed. 30 tablet 0  ? allopurinol (ZYLOPRIM) 100 MG tablet Take 100 mg by mouth daily.    ? aspirin 81 MG EC tablet Take 1 tablet (81 mg total) by mouth daily. Swallow whole. 30 tablet 11  ? atorvastatin (LIPITOR) 80 MG tablet Take 1 tablet (80 mg total) by mouth daily. 30 tablet 0  ? dapagliflozin propanediol (FARXIGA) 10 MG TABS tablet Take 1 tablet (10 mg total) by mouth daily. 30 tablet 2  ? ezetimibe (ZETIA) 10 MG tablet Take 1 tablet (10 mg total) by mouth daily. 90 tablet 3  ? ferrous Q000111Q C-folic acid (TRINSICON / FOLTRIN) capsule Take 1 capsule by mouth 2 (two) times daily after a meal. For 1 month. 60 capsule 0  ? levothyroxine (SYNTHROID) 75 MCG tablet Take 1 tablet (75 mcg total) by mouth daily at 6 (six) AM. 30 tablet 0  ? metoprolol succinate (TOPROL XL) 25 MG 24 hr tablet Take 0.5 tablets (12.5 mg total) by mouth daily. 30 tablet 2  ? warfarin (COUMADIN) 1 MG tablet Take 2 tablets by mouth daily except 3mg  on Sundays and Thursdays or as directed by  Anticoagulation Clinic. 70 tablet 1  ? ?No current facility-administered medications for this visit.  ? ? ?Physical Exam: ? ?BP 134/77   Pulse 70   Resp 20   Ht 5\' 3"  (1.6 m)   Wt 152 lb (68.9 kg)   SpO2 97%   BMI 26.93 kg/m?  ? ?Gen: no apparent distress ?Heart: RRR ?Lungs: CTA on right, diminished left base ?Abd: soft non-tender, non-distended ?Ext: no edema, ulceration on left leg from previous ant bites, which he states is improving ?Incisions: well healed ? ?Diagnostic Tests: ? ?CXR: small left pleural effusion ? ?A/P: ? ?S/P CABG x 4 and Aortic Valve Replacement ?INR on 5/1 was only 1.3, this is being managed by the coumadin clinic.. will continue for 3 months, then he will be transitioned to Eliquis ?Left Pleural Effusion- will give several doses of lasix, plan to repeat CXR in 2 weeks, if effusions worsens can refer for Thoracentesis ?Atrial Fibrillation- EKG at AHF clinic on 4/25 which showed the patient to be in NSR.. medications are being optimized as able, next appointment is on 5/18 ?Activity- okay to increase ambulation as tolerated.  Okay to enroll in cardiac rehabilitation if interested.  He was provided clearance to start driving as long as narcotic pain medications have been discontinued. Sternal precautions again discussed he can increase  activity as instructed ?Clearance provided to undergo cataract surgery ?Will fax cardiac rehab order to [facility] as requested ?RTC in 2 weeks ? ?Ellwood Handler, PA-C ?[facility] ?[phone] ? ?

## 2021-08-28 NOTE — Patient Instructions (Signed)
You are encouraged to enroll and participate in the outpatient cardiac rehab program beginning as soon as practical.  You may return to driving an automobile as long as you are no longer requiring oral narcotic pain relievers during the daytime.  It would be wise to start driving only short distances during the daylight and gradually increase from there as you feel comfortable.  Make every effort to maintain a "heart-healthy" lifestyle with regular physical exercise and adherence to a low-fat, low-carbohydrate diet.  Continue to seek regular follow-up appointments with your primary care physician and/or cardiologist.  You may continue to gradually increase your physical activity as tolerated.  Refrain from any heavy lifting or strenuous use of your arms and shoulders until at least 8 weeks from the time of your surgery, and avoid activities that cause increased pain in your chest on the side of your surgical incision.  Otherwise you may continue to increase activities without any particular limitations.  Increase the intensity and duration of physical activity gradually.  

## 2021-09-02 ENCOUNTER — Ambulatory Visit: Payer: Medicare Other | Admitting: *Deleted

## 2021-09-02 DIAGNOSIS — Z952 Presence of prosthetic heart valve: Secondary | ICD-10-CM | POA: Diagnosis not present

## 2021-09-02 DIAGNOSIS — I480 Paroxysmal atrial fibrillation: Secondary | ICD-10-CM | POA: Diagnosis not present

## 2021-09-02 LAB — POCT INR: INR: 1.3 — AB (ref 2.0–3.0)

## 2021-09-02 NOTE — Patient Instructions (Addendum)
Description   ?Today take 3mg  (3 tablets) and take 4mg  (4 tablets) tomorrow, then START taking warfarin 4 tablets daily except 3mg  on Monday and Friday. Recheck INR in 1 week. Coumadin Clinic [phone].  ? ?  ?  ? ?

## 2021-09-10 ENCOUNTER — Ambulatory Visit: Payer: Medicare Other | Admitting: *Deleted

## 2021-09-10 DIAGNOSIS — Z952 Presence of prosthetic heart valve: Secondary | ICD-10-CM | POA: Diagnosis not present

## 2021-09-10 DIAGNOSIS — Z5181 Encounter for therapeutic drug level monitoring: Secondary | ICD-10-CM | POA: Diagnosis not present

## 2021-09-10 DIAGNOSIS — I480 Paroxysmal atrial fibrillation: Secondary | ICD-10-CM

## 2021-09-10 LAB — POCT INR: INR: 1.3 — AB (ref 2.0–3.0)

## 2021-09-10 MED ORDER — WARFARIN SODIUM 4 MG PO TABS: ORAL_TABLET | ORAL | 1 refills | Status: DC

## 2021-09-10 NOTE — Patient Instructions (Addendum)
Description   ?Today take 6mg  of Warfarin and 6mg  of Warfarin tomorrow then START taking warfarin 6mg  daily except 4mg  on Monday, Wednesday, and Friday. Recheck INR in 1 week. Coumadin Clinic [phone].  ? ?  ?  ? ?

## 2021-09-11 ENCOUNTER — Telehealth: Payer: Self-pay

## 2021-09-11 DIAGNOSIS — E785 Hyperlipidemia, unspecified: Secondary | ICD-10-CM

## 2021-09-11 NOTE — Telephone Encounter (Signed)
-----   Message from Olene Floss, RPH-CPP sent at 09/11/2021  8:01 AM EDT ----- ?Please set up lipids, apo b  ?----- Message ----- ?From: Olene Floss, RPH-CPP ?Sent: 09/04/2021  12:00 AM EDT ?To: Olene Floss, RPH-CPP ? ?Set up lipids ? ? ?

## 2021-09-11 NOTE — Telephone Encounter (Signed)
Called and lmom pt that we needed to schedule fasting labs. 1st attempt will route to myself to continue calling ?

## 2021-09-12 ENCOUNTER — Other Ambulatory Visit: Payer: Self-pay

## 2021-09-12 ENCOUNTER — Ambulatory Visit (HOSPITAL_COMMUNITY)
Admission: RE | Admit: 2021-09-12 | Discharge: 2021-09-12 | Disposition: A | Discharge status: Home / Self Care | Payer: Medicare Other | Source: Ambulatory Visit | Attending: Cardiology | Admitting: Cardiology

## 2021-09-12 ENCOUNTER — Ambulatory Visit: Payer: Self-pay

## 2021-09-12 VITALS — BP 136/74 | HR 80 | Wt 147.6 lb

## 2021-09-12 DIAGNOSIS — Z7901 Long term (current) use of anticoagulants: Secondary | ICD-10-CM | POA: Insufficient documentation

## 2021-09-12 DIAGNOSIS — I2510 Atherosclerotic heart disease of native coronary artery without angina pectoris: Secondary | ICD-10-CM | POA: Diagnosis not present

## 2021-09-12 DIAGNOSIS — I5020 Unspecified systolic (congestive) heart failure: Secondary | ICD-10-CM

## 2021-09-12 DIAGNOSIS — I255 Ischemic cardiomyopathy: Secondary | ICD-10-CM | POA: Diagnosis not present

## 2021-09-12 DIAGNOSIS — Z79899 Other long term (current) drug therapy: Secondary | ICD-10-CM | POA: Diagnosis not present

## 2021-09-12 DIAGNOSIS — Z7902 Long term (current) use of antithrombotics/antiplatelets: Secondary | ICD-10-CM | POA: Diagnosis not present

## 2021-09-12 DIAGNOSIS — E785 Hyperlipidemia, unspecified: Secondary | ICD-10-CM | POA: Insufficient documentation

## 2021-09-12 DIAGNOSIS — Z79890 Hormone replacement therapy (postmenopausal): Secondary | ICD-10-CM | POA: Insufficient documentation

## 2021-09-12 DIAGNOSIS — I252 Old myocardial infarction: Secondary | ICD-10-CM | POA: Diagnosis not present

## 2021-09-12 DIAGNOSIS — I5022 Chronic systolic (congestive) heart failure: Secondary | ICD-10-CM | POA: Diagnosis not present

## 2021-09-12 DIAGNOSIS — Z951 Presence of aortocoronary bypass graft: Secondary | ICD-10-CM | POA: Insufficient documentation

## 2021-09-12 DIAGNOSIS — E1122 Type 2 diabetes mellitus with diabetic chronic kidney disease: Secondary | ICD-10-CM | POA: Diagnosis not present

## 2021-09-12 DIAGNOSIS — I480 Paroxysmal atrial fibrillation: Secondary | ICD-10-CM | POA: Diagnosis not present

## 2021-09-12 DIAGNOSIS — N1831 Chronic kidney disease, stage 3a: Secondary | ICD-10-CM | POA: Diagnosis not present

## 2021-09-12 DIAGNOSIS — E039 Hypothyroidism, unspecified: Secondary | ICD-10-CM | POA: Insufficient documentation

## 2021-09-12 DIAGNOSIS — J90 Pleural effusion, not elsewhere classified: Secondary | ICD-10-CM

## 2021-09-12 DIAGNOSIS — I350 Nonrheumatic aortic (valve) stenosis: Secondary | ICD-10-CM | POA: Insufficient documentation

## 2021-09-12 DIAGNOSIS — Z87891 Personal history of nicotine dependence: Secondary | ICD-10-CM | POA: Diagnosis not present

## 2021-09-12 DIAGNOSIS — Z7984 Long term (current) use of oral hypoglycemic drugs: Secondary | ICD-10-CM | POA: Insufficient documentation

## 2021-09-12 LAB — BASIC METABOLIC PANEL
Anion gap: 11 (ref 5–15)
BUN: 44 mg/dL — ABNORMAL HIGH (ref 8–23)
CO2: 23 mmol/L (ref 22–32)
Calcium: 9.3 mg/dL (ref 8.9–10.3)
Chloride: 106 mmol/L (ref 98–111)
Creatinine, Ser: 1.2 mg/dL (ref 0.61–1.24)
GFR, Estimated: >60 mL/min (ref >60)
Glucose, Bld: 223 mg/dL — ABNORMAL HIGH (ref 70–99)
Potassium: 4.4 mmol/L (ref 3.5–5.1)
Sodium: 140 mmol/L (ref 135–145)

## 2021-09-12 MED ORDER — LOSARTAN POTASSIUM 25 MG PO TABS: 12.5000 mg | ORAL_TABLET | Freq: Every day | ORAL | 3 refills | Status: DC

## 2021-09-12 MED ORDER — METOPROLOL SUCCINATE ER 25 MG PO TB24: 25.0000 mg | ORAL_TABLET | Freq: Every day | ORAL | 3 refills | Status: DC

## 2021-09-12 MED ORDER — FUROSEMIDE 20 MG PO TABS: 20.0000 mg | ORAL_TABLET | Freq: Every day | ORAL | 1 refills | Status: DC | PRN

## 2021-09-12 NOTE — Patient Instructions (Addendum)
It was a pleasure seeing you today!  MEDICATIONS: -We are changing your medications today -Increase metoprolol succinate to 25 mg (1 tablet) daily - Start losartan 12.5 mg (1/2 tablet) daily -Call if you have questions about your medications.  LABS: -We will call you if your labs need attention  NEXT APPOINTMENT: Return to clinic in 4 weeks with Dr. Gala Romney.  In general, to take care of your heart failure: -Limit your fluid intake to 2 Liters (half-gallon) per day.   -Limit your salt intake to ideally 2-3 grams (2000-3000 mg) per day. -Weigh yourself daily and record, and bring that "weight diary" to your next appointment.  (Weight gain of 2-3 pounds in 1 day typically means fluid weight.) -The medications for your heart are to help your heart and help you live longer.   -Please contact us before stopping any of your heart medications.  Call the clinic at [phone] with questions or to reschedule future appointments.

## 2021-09-16 NOTE — Telephone Encounter (Signed)
Called and was able to schedule labs and coumadin for same day at [facility] on 5/25

## 2021-09-17 DIAGNOSIS — E782 Mixed hyperlipidemia: Secondary | ICD-10-CM | POA: Diagnosis not present

## 2021-09-17 DIAGNOSIS — M109 Gout, unspecified: Secondary | ICD-10-CM | POA: Diagnosis not present

## 2021-09-17 DIAGNOSIS — E538 Deficiency of other specified B group vitamins: Secondary | ICD-10-CM | POA: Diagnosis not present

## 2021-09-17 DIAGNOSIS — N183 Chronic kidney disease, stage 3 unspecified: Secondary | ICD-10-CM | POA: Diagnosis not present

## 2021-09-17 DIAGNOSIS — D649 Anemia, unspecified: Secondary | ICD-10-CM | POA: Diagnosis not present

## 2021-09-17 DIAGNOSIS — E1122 Type 2 diabetes mellitus with diabetic chronic kidney disease: Secondary | ICD-10-CM | POA: Diagnosis not present

## 2021-09-19 ENCOUNTER — Ambulatory Visit (INDEPENDENT_AMBULATORY_CARE_PROVIDER_SITE_OTHER): Payer: Medicare Other | Admitting: Pharmacist Clinician (PhC)/ Clinical Pharmacy Specialist

## 2021-09-19 DIAGNOSIS — Z952 Presence of prosthetic heart valve: Secondary | ICD-10-CM | POA: Diagnosis not present

## 2021-09-19 DIAGNOSIS — Z7901 Long term (current) use of anticoagulants: Secondary | ICD-10-CM

## 2021-09-19 DIAGNOSIS — I480 Paroxysmal atrial fibrillation: Secondary | ICD-10-CM

## 2021-09-19 LAB — POCT INR: INR: 3.1 — AB (ref 2.0–3.0)

## 2021-09-24 ENCOUNTER — Telehealth: Payer: Self-pay | Admitting: Pharmacist

## 2021-09-24 NOTE — Telephone Encounter (Signed)
Reviewed lipid panel in [facility]. LDL-C down to 39. Continue atorvastatin 80mg  daily and zetia 10mg  daily.  Spoke with patient and reviewed above information.

## 2021-09-26 ENCOUNTER — Ambulatory Visit (HOSPITAL_COMMUNITY)
Admission: RE | Admit: 2021-09-26 | Discharge: 2021-09-26 | Disposition: A | Discharge status: Home / Self Care | Payer: Medicare Other | Source: Ambulatory Visit | Attending: Internal Medicine | Admitting: Internal Medicine

## 2021-09-26 ENCOUNTER — Ambulatory Visit (INDEPENDENT_AMBULATORY_CARE_PROVIDER_SITE_OTHER): Payer: Medicare Other

## 2021-09-26 DIAGNOSIS — Z5181 Encounter for therapeutic drug level monitoring: Secondary | ICD-10-CM | POA: Diagnosis not present

## 2021-09-26 DIAGNOSIS — I5020 Unspecified systolic (congestive) heart failure: Secondary | ICD-10-CM | POA: Insufficient documentation

## 2021-09-26 DIAGNOSIS — I480 Paroxysmal atrial fibrillation: Secondary | ICD-10-CM | POA: Diagnosis not present

## 2021-09-26 DIAGNOSIS — Z952 Presence of prosthetic heart valve: Secondary | ICD-10-CM

## 2021-09-26 LAB — BASIC METABOLIC PANEL
Anion gap: 7 (ref 5–15)
BUN: 22 mg/dL (ref 8–23)
CO2: 22 mmol/L (ref 22–32)
Calcium: 8.9 mg/dL (ref 8.9–10.3)
Chloride: 107 mmol/L (ref 98–111)
Creatinine, Ser: 1.14 mg/dL (ref 0.61–1.24)
GFR, Estimated: >60 mL/min (ref >60)
Glucose, Bld: 213 mg/dL — ABNORMAL HIGH (ref 70–99)
Potassium: 4.7 mmol/L (ref 3.5–5.1)
Sodium: 136 mmol/L (ref 135–145)

## 2021-09-26 LAB — POCT INR: INR: 2.8 (ref 2.0–3.0)

## 2021-09-26 NOTE — Patient Instructions (Signed)
Take warfarin 6mg  daily except 4mg  on Monday, Wednesday, and Friday. Recheck INR in 3 week. Coumadin Clinic [phone].

## 2021-10-01 ENCOUNTER — Other Ambulatory Visit (HOSPITAL_COMMUNITY): Payer: Self-pay

## 2021-10-04 ENCOUNTER — Other Ambulatory Visit: Payer: Self-pay | Admitting: Interventional Cardiology

## 2021-10-18 ENCOUNTER — Encounter (HOSPITAL_COMMUNITY): Payer: Self-pay | Admitting: Internal Medicine

## 2021-10-18 ENCOUNTER — Ambulatory Visit (HOSPITAL_COMMUNITY)
Admission: RE | Admit: 2021-10-18 | Discharge: 2021-10-18 | Disposition: A | Discharge status: Home / Self Care | Payer: Medicare Other | Source: Ambulatory Visit | Attending: Internal Medicine | Admitting: Internal Medicine

## 2021-10-18 ENCOUNTER — Ambulatory Visit (HOSPITAL_BASED_OUTPATIENT_CLINIC_OR_DEPARTMENT_OTHER)
Admission: RE | Admit: 2021-10-18 | Discharge: 2021-10-18 | Disposition: A | Discharge status: Home / Self Care | Payer: Medicare Other | Source: Ambulatory Visit | Attending: Internal Medicine | Admitting: Internal Medicine

## 2021-10-18 ENCOUNTER — Telehealth (HOSPITAL_COMMUNITY): Payer: Self-pay | Admitting: Cardiology

## 2021-10-18 ENCOUNTER — Other Ambulatory Visit (HOSPITAL_COMMUNITY): Payer: Self-pay

## 2021-10-18 VITALS — BP 110/70 | HR 65 | Wt 157.0 lb

## 2021-10-18 DIAGNOSIS — I480 Paroxysmal atrial fibrillation: Secondary | ICD-10-CM | POA: Diagnosis not present

## 2021-10-18 DIAGNOSIS — E785 Hyperlipidemia, unspecified: Secondary | ICD-10-CM | POA: Diagnosis not present

## 2021-10-18 DIAGNOSIS — N1831 Chronic kidney disease, stage 3a: Secondary | ICD-10-CM | POA: Insufficient documentation

## 2021-10-18 DIAGNOSIS — E1122 Type 2 diabetes mellitus with diabetic chronic kidney disease: Secondary | ICD-10-CM | POA: Diagnosis not present

## 2021-10-18 DIAGNOSIS — I2510 Atherosclerotic heart disease of native coronary artery without angina pectoris: Secondary | ICD-10-CM | POA: Diagnosis not present

## 2021-10-18 DIAGNOSIS — Z7984 Long term (current) use of oral hypoglycemic drugs: Secondary | ICD-10-CM | POA: Insufficient documentation

## 2021-10-18 DIAGNOSIS — Z7901 Long term (current) use of anticoagulants: Secondary | ICD-10-CM | POA: Insufficient documentation

## 2021-10-18 DIAGNOSIS — Z79899 Other long term (current) drug therapy: Secondary | ICD-10-CM | POA: Diagnosis not present

## 2021-10-18 DIAGNOSIS — E113593 Type 2 diabetes mellitus with proliferative diabetic retinopathy without macular edema, bilateral: Secondary | ICD-10-CM | POA: Diagnosis not present

## 2021-10-18 DIAGNOSIS — I340 Nonrheumatic mitral (valve) insufficiency: Secondary | ICD-10-CM | POA: Insufficient documentation

## 2021-10-18 DIAGNOSIS — Z952 Presence of prosthetic heart valve: Secondary | ICD-10-CM

## 2021-10-18 DIAGNOSIS — I252 Old myocardial infarction: Secondary | ICD-10-CM | POA: Diagnosis not present

## 2021-10-18 DIAGNOSIS — I255 Ischemic cardiomyopathy: Secondary | ICD-10-CM | POA: Insufficient documentation

## 2021-10-18 DIAGNOSIS — H2513 Age-related nuclear cataract, bilateral: Secondary | ICD-10-CM | POA: Diagnosis not present

## 2021-10-18 DIAGNOSIS — I5020 Unspecified systolic (congestive) heart failure: Secondary | ICD-10-CM | POA: Diagnosis not present

## 2021-10-18 DIAGNOSIS — Z87891 Personal history of nicotine dependence: Secondary | ICD-10-CM | POA: Diagnosis not present

## 2021-10-18 DIAGNOSIS — Z951 Presence of aortocoronary bypass graft: Secondary | ICD-10-CM | POA: Diagnosis not present

## 2021-10-18 DIAGNOSIS — Z7982 Long term (current) use of aspirin: Secondary | ICD-10-CM | POA: Diagnosis not present

## 2021-10-18 DIAGNOSIS — I5022 Chronic systolic (congestive) heart failure: Secondary | ICD-10-CM | POA: Diagnosis not present

## 2021-10-18 DIAGNOSIS — E1142 Type 2 diabetes mellitus with diabetic polyneuropathy: Secondary | ICD-10-CM | POA: Diagnosis not present

## 2021-10-18 DIAGNOSIS — I5023 Acute on chronic systolic (congestive) heart failure: Secondary | ICD-10-CM | POA: Diagnosis not present

## 2021-10-18 LAB — CBC
HCT: 37.6 % — ABNORMAL LOW (ref 39.0–52.0)
Hemoglobin: 11.8 g/dL — ABNORMAL LOW (ref 13.0–17.0)
MCH: 29.6 pg (ref 26.0–34.0)
MCHC: 31.4 g/dL (ref 30.0–36.0)
MCV: 94.5 fL (ref 80.0–100.0)
Platelets: 354 10*3/uL (ref 150–400)
RBC: 3.98 MIL/uL — ABNORMAL LOW (ref 4.22–5.81)
RDW: 15.9 % — ABNORMAL HIGH (ref 11.5–15.5)
WBC: 11.4 10*3/uL — ABNORMAL HIGH (ref 4.0–10.5)
nRBC: 0 % (ref 0.0–0.2)

## 2021-10-18 LAB — ECHOCARDIOGRAM COMPLETE
AR max vel: 3.43 cm2
AV Area VTI: 2.85 cm2
AV Area mean vel: 3.14 cm2
AV Mean grad: 6 mmHg
AV Peak grad: 11 mmHg
Ao pk vel: 1.66 m/s
Area-P 1/2: 3.53 cm2
Calc EF: 56.9 %
S' Lateral: 4 cm
Single Plane A2C EF: 58.6 %
Single Plane A4C EF: 56.3 %

## 2021-10-18 LAB — BASIC METABOLIC PANEL
Anion gap: 7 (ref 5–15)
BUN: 31 mg/dL — ABNORMAL HIGH (ref 8–23)
CO2: 23 mmol/L (ref 22–32)
Calcium: 9.3 mg/dL (ref 8.9–10.3)
Chloride: 111 mmol/L (ref 98–111)
Creatinine, Ser: 1.18 mg/dL (ref 0.61–1.24)
GFR, Estimated: >60 mL/min (ref >60)
Glucose, Bld: 126 mg/dL — ABNORMAL HIGH (ref 70–99)
Potassium: 5.2 mmol/L — ABNORMAL HIGH (ref 3.5–5.1)
Sodium: 141 mmol/L (ref 135–145)

## 2021-10-18 LAB — PROTIME-INR
INR: 4.1 (ref 0.8–1.2)
Prothrombin Time: 39.4 seconds — ABNORMAL HIGH (ref 11.4–15.2)

## 2021-10-18 MED ORDER — APIXABAN 5 MG PO TABS: 5.0000 mg | ORAL_TABLET | Freq: Two times a day (BID) | ORAL | 6 refills | Status: DC

## 2021-10-18 MED ORDER — LOSARTAN POTASSIUM 25 MG PO TABS: 12.5000 mg | ORAL_TABLET | Freq: Every day | ORAL | 3 refills | Status: DC

## 2021-10-30 DIAGNOSIS — M109 Gout, unspecified: Secondary | ICD-10-CM | POA: Diagnosis not present

## 2021-10-30 DIAGNOSIS — E1122 Type 2 diabetes mellitus with diabetic chronic kidney disease: Secondary | ICD-10-CM | POA: Diagnosis not present

## 2021-10-30 DIAGNOSIS — E039 Hypothyroidism, unspecified: Secondary | ICD-10-CM | POA: Diagnosis not present

## 2021-10-30 DIAGNOSIS — D649 Anemia, unspecified: Secondary | ICD-10-CM | POA: Diagnosis not present

## 2021-11-08 ENCOUNTER — Other Ambulatory Visit (HOSPITAL_COMMUNITY): Payer: Self-pay | Admitting: Family Medicine

## 2021-11-12 DIAGNOSIS — H35033 Hypertensive retinopathy, bilateral: Secondary | ICD-10-CM | POA: Diagnosis not present

## 2021-11-12 DIAGNOSIS — H43823 Vitreomacular adhesion, bilateral: Secondary | ICD-10-CM | POA: Diagnosis not present

## 2021-11-12 DIAGNOSIS — H2513 Age-related nuclear cataract, bilateral: Secondary | ICD-10-CM | POA: Diagnosis not present

## 2021-11-12 DIAGNOSIS — E113413 Type 2 diabetes mellitus with severe nonproliferative diabetic retinopathy with macular edema, bilateral: Secondary | ICD-10-CM | POA: Diagnosis not present

## 2021-12-03 DIAGNOSIS — E113411 Type 2 diabetes mellitus with severe nonproliferative diabetic retinopathy with macular edema, right eye: Secondary | ICD-10-CM | POA: Diagnosis not present

## 2021-12-03 DIAGNOSIS — H43823 Vitreomacular adhesion, bilateral: Secondary | ICD-10-CM | POA: Diagnosis not present

## 2021-12-03 DIAGNOSIS — E113413 Type 2 diabetes mellitus with severe nonproliferative diabetic retinopathy with macular edema, bilateral: Secondary | ICD-10-CM | POA: Diagnosis not present

## 2021-12-12 DIAGNOSIS — H2512 Age-related nuclear cataract, left eye: Secondary | ICD-10-CM | POA: Diagnosis not present

## 2021-12-12 DIAGNOSIS — H2511 Age-related nuclear cataract, right eye: Secondary | ICD-10-CM | POA: Diagnosis not present

## 2021-12-12 DIAGNOSIS — H25011 Cortical age-related cataract, right eye: Secondary | ICD-10-CM | POA: Diagnosis not present

## 2021-12-17 ENCOUNTER — Telehealth: Payer: Self-pay | Admitting: Interventional Cardiology

## 2021-12-17 NOTE — Telephone Encounter (Signed)
Pt c/o BP issue: STAT if pt c/o blurred vision, one-sided weakness or slurred speech  1. What are your last 5 BP readings? 86/85: 110/51:  2. Are you having any other symptoms (ex. Dizziness, headache, blurred vision, passed out)? Extra tired   3. What is your BP issue? Running low

## 2021-12-18 NOTE — Telephone Encounter (Signed)
Called pt no answer/left vm for return call. 

## 2021-12-19 DIAGNOSIS — E1122 Type 2 diabetes mellitus with diabetic chronic kidney disease: Secondary | ICD-10-CM | POA: Diagnosis not present

## 2021-12-19 DIAGNOSIS — I959 Hypotension, unspecified: Secondary | ICD-10-CM | POA: Diagnosis not present

## 2021-12-19 DIAGNOSIS — E039 Hypothyroidism, unspecified: Secondary | ICD-10-CM | POA: Diagnosis not present

## 2021-12-19 DIAGNOSIS — N182 Chronic kidney disease, stage 2 (mild): Secondary | ICD-10-CM | POA: Diagnosis not present

## 2021-12-19 DIAGNOSIS — R519 Headache, unspecified: Secondary | ICD-10-CM | POA: Diagnosis not present

## 2021-12-20 ENCOUNTER — Telehealth (HOSPITAL_COMMUNITY): Payer: Self-pay | Admitting: *Deleted

## 2021-12-20 DIAGNOSIS — E113413 Type 2 diabetes mellitus with severe nonproliferative diabetic retinopathy with macular edema, bilateral: Secondary | ICD-10-CM | POA: Diagnosis not present

## 2021-12-20 DIAGNOSIS — E113412 Type 2 diabetes mellitus with severe nonproliferative diabetic retinopathy with macular edema, left eye: Secondary | ICD-10-CM | POA: Diagnosis not present

## 2021-12-20 DIAGNOSIS — H43823 Vitreomacular adhesion, bilateral: Secondary | ICD-10-CM | POA: Diagnosis not present

## 2021-12-20 NOTE — Telephone Encounter (Signed)
Pts daughter called stating pts bp has been 80-110/50's pt denies dizziness/lightheadedness but c/o no energy and headache for the past week. Pt denies cough or shortness breath.  Daughter asked if he needed to make any medication changes.   Routed to D.Bensimhon

## 2021-12-20 NOTE — Telephone Encounter (Signed)
Pt's daughter aware.

## 2021-12-24 ENCOUNTER — Telehealth (HOSPITAL_COMMUNITY): Payer: Self-pay | Admitting: *Deleted

## 2021-12-24 NOTE — Telephone Encounter (Signed)
Refill encounter ?

## 2022-01-02 DIAGNOSIS — H2512 Age-related nuclear cataract, left eye: Secondary | ICD-10-CM | POA: Diagnosis not present

## 2022-01-28 DIAGNOSIS — H43823 Vitreomacular adhesion, bilateral: Secondary | ICD-10-CM | POA: Diagnosis not present

## 2022-01-28 DIAGNOSIS — E113413 Type 2 diabetes mellitus with severe nonproliferative diabetic retinopathy with macular edema, bilateral: Secondary | ICD-10-CM | POA: Diagnosis not present

## 2022-01-28 DIAGNOSIS — H35033 Hypertensive retinopathy, bilateral: Secondary | ICD-10-CM | POA: Diagnosis not present

## 2022-03-01 ENCOUNTER — Other Ambulatory Visit (HOSPITAL_COMMUNITY): Payer: Self-pay | Admitting: Cardiology

## 2022-03-22 ENCOUNTER — Other Ambulatory Visit (HOSPITAL_COMMUNITY): Payer: Self-pay | Admitting: Internal Medicine

## 2022-03-24 DIAGNOSIS — E039 Hypothyroidism, unspecified: Secondary | ICD-10-CM | POA: Diagnosis not present

## 2022-03-24 DIAGNOSIS — M109 Gout, unspecified: Secondary | ICD-10-CM | POA: Diagnosis not present

## 2022-03-24 DIAGNOSIS — Z1211 Encounter for screening for malignant neoplasm of colon: Secondary | ICD-10-CM | POA: Diagnosis not present

## 2022-03-24 DIAGNOSIS — N182 Chronic kidney disease, stage 2 (mild): Secondary | ICD-10-CM | POA: Diagnosis not present

## 2022-03-24 DIAGNOSIS — Z23 Encounter for immunization: Secondary | ICD-10-CM | POA: Diagnosis not present

## 2022-03-24 DIAGNOSIS — E782 Mixed hyperlipidemia: Secondary | ICD-10-CM | POA: Diagnosis not present

## 2022-03-24 DIAGNOSIS — E1122 Type 2 diabetes mellitus with diabetic chronic kidney disease: Secondary | ICD-10-CM | POA: Diagnosis not present

## 2022-03-25 NOTE — Progress Notes (Deleted)
Cardiology Office Note:    Date:  03/25/2022   ID:  Antonio Cervantes, DOB [birth year], MRN [id number]  PCP:  Remo Lipps, PA  Cardiologist:  Lesleigh Noe, MD   Referring MD: Remo Lipps, PA   No chief complaint on file.   History of Present Illness:    Antonio Cervantes is a 66 y.o. male with a hx of hyperlipidemia, T2DM, remote tobacco use, non-ST elevation MI, acute systolic heart failure EF 20 to 25% => 55 to 60% June 2023 on GDMT, CAD, coronary artery bypass grafting 4/23, paroxysmal AF (postoperative), and surgical aortic valve replacement 07/2021.   ***  Past Medical History:  Diagnosis Date   Diabetes mellitus without complication (HCC)    Diabetic peripheral neuropathy associated with type 2 diabetes mellitus (HCC)     Past Surgical History:  Procedure Laterality Date   AORTIC VALVE REPLACEMENT N/A 07/29/2021   Procedure: AORTIC VALVE REPLACEMENT (AVR) USING EDWARDS INSPIRIS AORTIC VALVE SIZE ;  Surgeon: Alleen Borne, MD;  Location: [facility] OR;  Service: Open Heart Surgery;  Laterality: N/A;   CORONARY ARTERY BYPASS GRAFT N/A 07/29/2021   Procedure: CORONARY ARTERY BYPASS GRAFTING (CABG) TIMES FOUR, USING LEFT INTERNAL MAMMARY ARTERY AND RIGHT GREATER SAPHENOUS VEIN HARVESTED ENDOSCOPICALLYY.;  Surgeon: Alleen Borne, MD;  Location: [facility] OR;  Service: Open Heart Surgery;  Laterality: N/A;   RIGHT/LEFT HEART CATH AND CORONARY ANGIOGRAPHY N/A 05/21/2021   Procedure: RIGHT/LEFT HEART CATH AND CORONARY ANGIOGRAPHY;  Surgeon: Corky Crafts, MD;  Location: [facility] INVASIVE CV LAB;  Service: Cardiovascular;  Laterality: N/A;   TEE WITHOUT CARDIOVERSION N/A 07/29/2021   Procedure: TRANSESOPHAGEAL ECHOCARDIOGRAM (TEE);  Surgeon: Alleen Borne, MD;  Location: [facility] OR;  Service: Open Heart Surgery;  Laterality: N/A;    Current Medications: No outpatient medications have been marked as taking for the 03/26/22 encounter (Appointment) with Lyn Records, MD.      Allergies:   Patient has no known allergies.   Social History   Socioeconomic History   Marital status: Married    Spouse name: Not on file   Number of children: 1   Years of education: Not on file   Highest education level: 10th grade  Occupational History   Occupation: retired  Tobacco Use   Smoking status: Former    Packs/day: 1.50    Years: 15.00    Total pack years: 22.50    Types: Cigarettes    Quit date: 1983    Years since quitting: 40.9   Smokeless tobacco: Never  Vaping Use   Vaping Use: Never used  Substance and Sexual Activity   Alcohol use: No   Drug use: No   Sexual activity: Not on file  Other Topics Concern   Not on file  Social History Narrative   Not on file   Social Determinants of Health   Financial Resource Strain: Low Risk  (05/21/2021)   Overall Financial Resource Strain (CARDIA)    Difficulty of Paying Living Expenses: Not very hard  Food Insecurity: No Food Insecurity (05/21/2021)   Hunger Vital Sign    Worried About Running Out of Food in the Last Year: Never true    Ran Out of Food in the Last Year: Never true  Transportation Needs: No Transportation Needs (05/21/2021)   PRAPARE - Administrator, Civil Service (Medical): No    Lack of Transportation (Non-Medical): No  Physical Activity: Not on file  Stress: Not on file  Social Connections: Not on file     Family History: The patient's family history is not on file.  ROS:   Please see the history of present illness.    *** All other systems reviewed and are negative.  EKGs/Labs/Other Studies Reviewed:    The following studies were reviewed today:  2D Doppler echocardiogram 12/18/2021: IMPRESSIONS   1. Left ventricular ejection fraction, by estimation, is 55 to 60%. The  left ventricle has normal function. The left ventricle has no regional  wall motion abnormalities. Left ventricular diastolic parameters are  consistent with Grade II diastolic  dysfunction  (pseudonormalization).   2. Right ventricular systolic function is normal. The right ventricular  size is normal.   3. Left atrial size was mildly dilated.   4. Right atrial size was mildly dilated.   5. The mitral valve is normal in structure. Mild mitral valve  regurgitation. No evidence of mitral stenosis.   6. The aortic valve has been repaired/replaced. Aortic valve  regurgitation is not visualized. No aortic stenosis is present. There is a  23 mm Inspiris valve present in the aortic position. Procedure Date:  07/2021. Echo findings are consistent with normal   structure and function of the aortic valve prosthesis. Aortic valve area,  by VTI measures 2.85 cm. Aortic valve mean gradient measures 6.0 mmHg.  Aortic valve Vmax measures 1.66 m/s.   7. The inferior vena cava is normal in size with greater than 50%  respiratory variability, suggesting right atrial pressure of 3 mmHg.    EKG:  EKG ***  Recent Labs: 07/22/2021: ALT 20; B Natriuretic Peptide 237.3 07/25/2021: TSH 12.447 07/30/2021: Magnesium 2.4 10/18/2021: BUN 31; Creatinine, Ser 1.18; Hemoglobin 11.8; Platelets 354; Potassium 5.2; Sodium 141  Recent Lipid Panel    Component Value Date/Time   CHOL 73 07/23/2021 0453   TRIG 108 07/23/2021 0453   HDL 19 (L) 07/23/2021 0453   CHOLHDL 3.8 07/23/2021 0453   VLDL 22 07/23/2021 0453   LDLCALC 32 07/23/2021 0453    Physical Exam:    VS:  There were no vitals taken for this visit.    Wt Readings from Last 3 Encounters:  10/18/21 157 lb (71.2 kg)  09/12/21 147 lb 9.6 oz (67 kg)  08/28/21 152 lb (68.9 kg)     GEN: ***. No acute distress HEENT: Normal NECK: No JVD. LYMPHATICS: No lymphadenopathy CARDIAC: *** murmur. RRR *** gallop, or edema. VASCULAR: *** Normal Pulses. No bruits. RESPIRATORY:  Clear to auscultation without rales, wheezing or rhonchi  ABDOMEN: Soft, non-tender, non-distended, No pulsatile mass, MUSCULOSKELETAL: No deformity  SKIN: Warm and  dry NEUROLOGIC:  Alert and oriented x 3 PSYCHIATRIC:  Normal affect   ASSESSMENT:    1. 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 chronic systolic heart failure, NYHA class 2 (HCC)   3. S/P aortic valve replacement   4. Long term (current) use of anticoagulants   5. Hyperlipidemia, unspecified hyperlipidemia type   6. Type 2 diabetes mellitus with stage 3 chronic kidney disease, unspecified whether long term insulin use, unspecified whether stage 3a or 3b CKD (HCC)   7. Paroxysmal atrial fibrillation (HCC)    PLAN:    In order of problems listed above:  ***   Medication Adjustments/Labs and Tests Ordered: Current medicines are reviewed at length with the patient today.  Concerns regarding medicines are outlined above.  No orders of the defined types were placed in this encounter.  No orders of the defined types  were placed in this encounter.   There are no Patient Instructions on file for this visit.   Signed, Lesleigh Noe, MD  03/25/2022 12:16 PM    [facility]

## 2022-03-26 ENCOUNTER — Ambulatory Visit: Payer: Medicare Other | Admitting: Interventional Cardiology

## 2022-03-26 DIAGNOSIS — E785 Hyperlipidemia, unspecified: Secondary | ICD-10-CM

## 2022-03-26 DIAGNOSIS — I480 Paroxysmal atrial fibrillation: Secondary | ICD-10-CM

## 2022-03-26 DIAGNOSIS — Z7901 Long term (current) use of anticoagulants: Secondary | ICD-10-CM

## 2022-03-26 DIAGNOSIS — E1122 Type 2 diabetes mellitus with diabetic chronic kidney disease: Secondary | ICD-10-CM

## 2022-03-26 DIAGNOSIS — I5023 Acute on chronic systolic (congestive) heart failure: Secondary | ICD-10-CM

## 2022-03-26 DIAGNOSIS — Z952 Presence of prosthetic heart valve: Secondary | ICD-10-CM

## 2022-03-26 DIAGNOSIS — I25810 Atherosclerosis of coronary artery bypass graft(s) without angina pectoris: Secondary | ICD-10-CM

## 2022-04-01 NOTE — Progress Notes (Unsigned)
Cardiology Office Note:    Date:  04/02/2022   ID:  Antonio Cervantes, DOB October 08, 1955, MRN [id number]  PCP:  Antonio Lipps, PA  [facility] Providers Cardiologist:  Antonio Noe, MD     Referring MD: Antonio Lipps, PA   Chief Complaint:  Follow-up     History of Present Illness:   Antonio Cervantes is a 66 y.o. male with history of DM2 tobacco use, CAD, AF, HLD. NSETMI 04/2021 with CHF and AFib with RVR unknown duration but  spontaneously converted back to NSR. Echo with severely reduced LVEF, 20-25%, RV normal. LHC showed severe MVCAD, however given diffuse, distal vessel disease, there were no good options for revascularization (Diffuse inoperable CAD, targets not amendable to PCI). RHC showed normal filling pressures and normal CO  5.4 L/min, cardiac index 2.88. Medical therapy for CAD was recommended. there was also concern for potential aortic stenosis base on echo interpretation, "degree of AS hard to judge due to severe decrease in LV function DVI 0.55 AVA 2 cm2 but morphology and gradients suggest AS could be moderate" .  Re-admitted 3/23 with NSTEMI. Underwent cMRI showing LVEF 47%, normal RVEF, viable multi-vessel disease. Moderate AI. CVTS consulted and underwent CABG x 4 and aortic valve replacement. Course complicated by persistent AF despite amiodarone and started on Coumadin (plan to continue for 3 months then switch to Eliquis). Antonio Cervantes, beta blocker, and Lasix stopped with low BP and bradycardia. Discharged home, weight 163 lbs.  Echo 10/18/21 EF 55-60% G2DD Aov looks good. Was seen by Dr. Gala Cervantes 10/18/21 and was in NSR and switched to eliquis.  Patient comes in for f/u with his daughter in law and grand daughter. Patient denies chest pain, dyspnea, edema, palpitations. Walking 1-1 1/2 miles every other day.      Past Medical History:  Diagnosis Date   Diabetes mellitus without complication (HCC)    Diabetic peripheral neuropathy associated with type  2 diabetes mellitus (HCC)    Current Medications: Current Meds  Medication Sig   allopurinol (ZYLOPRIM) 100 MG tablet Take 200 mg by mouth daily.   aspirin 81 MG EC tablet Take 1 tablet (81 mg total) by mouth daily. Swallow whole.   atorvastatin (LIPITOR) 80 MG tablet Take 1 tablet (80 mg total) by mouth daily.   dapagliflozin propanediol (FARXIGA) 10 MG TABS tablet Take 1 tablet (10 mg total) by mouth daily.   ELIQUIS 5 MG TABS tablet TAKE 1 TABLET BY MOUTH TWICE A DAY   furosemide (LASIX) 20 MG tablet Take 1 tablet (20 mg total) by mouth daily as needed for fluid.   levothyroxine (SYNTHROID) 112 MCG tablet Take 112 mcg by mouth daily.   metFORMIN (GLUCOPHAGE) 850 MG tablet Take 850 mg by mouth 2 (two) times daily with a meal.   metoprolol succinate (TOPROL XL) 25 MG 24 hr tablet Take 1 tablet (25 mg total) by mouth daily.   [DISCONTINUED] ezetimibe (ZETIA) 10 MG tablet Take 1 tablet (10 mg total) by mouth daily.    Allergies:   Patient has no known allergies.   Social History   Tobacco Use   Smoking status: Former    Packs/day: 1.50    Years: 15.00    Total pack years: 22.50    Types: Cigarettes    Quit date: 86    Years since quitting: 40.9   Smokeless tobacco: Never  Vaping Use   Vaping Use: Never used  Substance Use Topics   Alcohol use:  No   Drug use: No    Family Hx: The patient's family history is not on file.  ROS     Physical Exam:    VS:  BP 130/70   Pulse 62   Ht 5\' 3"  (1.6 m)   Wt 170 lb (77.1 kg)   SpO2 99%   BMI 30.11 kg/m     Wt Readings from Last 3 Encounters:  04/02/22 170 lb (77.1 kg)  10/18/21 157 lb (71.2 kg)  09/12/21 147 lb 9.6 oz (67 kg)    Physical Exam  GEN: Well nourished, well developed, in no acute distress  Neck: no JVD, carotid bruits, or masses Cardiac:RRR; 1/6 systolic murmur LSB Respiratory:  clear to auscultation bilaterally, normal work of breathing GI: soft, nontender, nondistended, + BS Ext: without cyanosis,  clubbing, or edema, Good distal pulses bilaterally Neuro:  Alert and Oriented x 3, Strength and sensation are intact Psych: euthymic mood, full affect        EKGs/Labs/Other Test Reviewed:    EKG:  EKG is  not ordered today.     Recent Labs: 07/22/2021: ALT 20; B Natriuretic Peptide 237.3 07/25/2021: TSH 12.447 07/30/2021: Magnesium 2.4 10/18/2021: BUN 31; Creatinine, Ser 1.18; Hemoglobin 11.8; Platelets 354; Potassium 5.2; Sodium 141   Recent Lipid Panel Recent Labs    07/23/21 0453  CHOL 73  TRIG 108  HDL 19*  VLDL 22  LDLCALC 32     Prior CV Studies:   Cardiac Testing  - cMRI (3/23): LVEF 47%, normal RVEF, mild to moderate AI, study suggests viable multi-vessel disease.   - Echo (3/23): EF 50%, mild LVH, RV ok, mild AI   - Echo (1/23): EF 20-25%, severe LV dysfunction, mild LVH, RV ok, mild MR, degree of AS hard to judge due to severe decrease in LV function DVI 0.55 AVA 2 cm2 but morphology and gradients suggest AS could be moderate .    - LHC/RHC (05/21/21):   Prox LAD to Mid LAD lesion is 75% stenosed.   Dist LAD lesion is 80% stenosed.   1st Diag lesion is 80% stenosed.   Ost RCA to Mid RCA lesion is 99% stenosed.   2nd Mrg lesion is 99% stenosed.   Dist Cx lesion is 90% stenosed.   Prox Cx to Mid Cx lesion is 75% stenosed.   LV end diastolic pressure is normal.   There is no aortic valve stenosis.   Severe, multivessel coronary artery disease.   Normal LVEDP. Given diffuse, distal vessel disease, there are no good options for revascularization.    Right heart pressures: RA mean 2 PA pressure 26/10 mean 17 PCWP mean 6 CO/CI 5.4/2.88 (Fick)   Risk Assessment/Calculations/Metrics:    CHA2DS2-VASc Score = 5   This indicates a 7.2% annual risk of stroke. The patient's score is based upon: CHF History: 1 HTN History: 1 Diabetes History: 1 Stroke History: 0 Vascular Disease History: 1 Age Score: 1 Gender Score: 0             ASSESSMENT & PLAN:    No problem-specific Assessment & Plan notes found for this encounter.   Chronic systolic CHF LVEF 20-25% echo 08-09-1995, 50% 06/2021, 47% cMRI 06/2021, 55-60% 10/18/21 G2DD -compensated and followed [facility] -check labs today  CAD NSTEMI 04/2021, managed medically, NSTEMI 06/2021 CABG 07/2021 -doing well without angina. Discussed with Dr. 08/2021 most likely can stop ASA in March if he is maintained on Eliquis  PAF on eliquis -has been maintaining NSR. First  had Afib 05/20/21 when presented with NSTEMI and then had persistent Afib post op and amiodarone stopped. Maintaining NSR. MD to decide if he needs lifetime Eliquis. -reach out to [place] to help with patient assistance  Aortic valve replacement -SBE prophylaxis  CKD 3a -check today  DM2-most recent A1C 7.2 per patient   HLD-LDL 39 08/2021              Dispo:  No follow-ups on file.   Medication Adjustments/Labs and Tests Ordered: Current medicines are reviewed at length with the patient today.  Concerns regarding medicines are outlined above.  Tests Ordered: Orders Placed This Encounter  Procedures   Basic metabolic panel   CBC   Medication Changes: Meds ordered this encounter  Medications   ezetimibe (ZETIA) 10 MG tablet    Sig: Take 1 tablet (10 mg total) by mouth daily.    Dispense:  90 tablet    Refill:  3   Signed, Jacolyn Reedy, PA-C  04/02/2022 2:01 PM    [facility] [address] [place], [place], [place]  [address] Phone: [phone]; Fax: [phone]

## 2022-04-02 ENCOUNTER — Ambulatory Visit: Payer: Medicare Other | Attending: Physician Assistant | Admitting: Physician Assistant

## 2022-04-02 ENCOUNTER — Encounter: Payer: Self-pay | Admitting: Physician Assistant

## 2022-04-02 VITALS — BP 130/70 | HR 62 | Ht 63.0 in | Wt 170.0 lb

## 2022-04-02 DIAGNOSIS — I480 Paroxysmal atrial fibrillation: Secondary | ICD-10-CM

## 2022-04-02 DIAGNOSIS — I2510 Atherosclerotic heart disease of native coronary artery without angina pectoris: Secondary | ICD-10-CM

## 2022-04-02 DIAGNOSIS — E1122 Type 2 diabetes mellitus with diabetic chronic kidney disease: Secondary | ICD-10-CM | POA: Diagnosis not present

## 2022-04-02 DIAGNOSIS — E785 Hyperlipidemia, unspecified: Secondary | ICD-10-CM | POA: Diagnosis not present

## 2022-04-02 DIAGNOSIS — Z952 Presence of prosthetic heart valve: Secondary | ICD-10-CM | POA: Diagnosis not present

## 2022-04-02 DIAGNOSIS — I5020 Unspecified systolic (congestive) heart failure: Secondary | ICD-10-CM

## 2022-04-02 DIAGNOSIS — N183 Chronic kidney disease, stage 3 unspecified: Secondary | ICD-10-CM | POA: Diagnosis not present

## 2022-04-02 MED ORDER — EZETIMIBE 10 MG PO TABS: 10.0000 mg | ORAL_TABLET | Freq: Every day | ORAL | 3 refills | Status: DC

## 2022-04-02 NOTE — Patient Instructions (Addendum)
Medication Instructions:  Your physician recommends that you continue on your current medications as directed. Please refer to the Current Medication list given to you today.  *If you need a refill on your cardiac medications before your next appointment, please call your pharmacy*  Lab Work: TODAY; CBC and BMET If you have labs (blood work) drawn today and your tests are completely normal, you will receive your results only by: MyChart Message (if you have MyChart) OR A paper copy in the mail If you have any lab test that is abnormal or we need to change your treatment, we will call you to review the results.  Follow-Up: At [facility], you and your health needs are our priority.  As part of our continuing mission to provide you with exceptional heart care, we have created designated Provider Care Teams.  These Care Teams include your primary Cardiologist (physician) and Advanced Practice Providers (APPs -  Physician Assistants and Nurse Practitioners) who all work together to provide you with the care you need, when you need it.  Your next appointment:   March 2024  The format for your next appointment:   In Person  Provider:   You will be set up with a new provider at our office  (Former Dr. Katrinka Blazing patient)  Important Information About Sugar

## 2022-04-03 ENCOUNTER — Telehealth: Payer: Self-pay

## 2022-04-03 LAB — BASIC METABOLIC PANEL
BUN/Creatinine Ratio: 15 (ref 10–24)
BUN: 21 mg/dL (ref 8–27)
CO2: 20 mmol/L (ref 20–29)
Calcium: 9.6 mg/dL (ref 8.6–10.2)
Chloride: 101 mmol/L (ref 96–106)
Creatinine, Ser: 1.4 mg/dL — ABNORMAL HIGH (ref 0.76–1.27)
Glucose: 153 mg/dL — ABNORMAL HIGH (ref 70–99)
Potassium: 4.8 mmol/L (ref 3.5–5.2)
Sodium: 135 mmol/L (ref 134–144)
eGFR: 55 mL/min/{1.73_m2} — ABNORMAL LOW (ref >59)

## 2022-04-03 LAB — CBC
Hematocrit: 37.6 % (ref 37.5–51.0)
Hemoglobin: 12.3 g/dL — ABNORMAL LOW (ref 13.0–17.7)
MCH: 30.4 pg (ref 26.6–33.0)
MCHC: 32.7 g/dL (ref 31.5–35.7)
MCV: 93 fL (ref 79–97)
Platelets: 274 10*3/uL (ref 150–450)
RBC: 4.05 x10E6/uL — ABNORMAL LOW (ref 4.14–5.80)
RDW: 13.4 % (ref 11.6–15.4)
WBC: 12 10*3/uL — ABNORMAL HIGH (ref 3.4–10.8)

## 2022-04-03 NOTE — Telephone Encounter (Signed)
**Note De-Identified Pink Maye Obfuscation** Per Herma Carson, PA-c request, I called the pts daughter in law [name] to discuss the pt applying for Eliquis assistance for this year.  Per Jessica's request, I have e-mailed her a BMSPAF application for Eliquis assistance to [email or web address].  I did request In my e-mail that she e-mail it back to me if she gets it ready today before 3 pm and if not to bring it to the office to drop off.  She is aware that she needs to complete the pts part of the application, have him sign/date it, and to get it back to [place] by April 11, 2022 as that is the deadline to apply.

## 2022-04-04 ENCOUNTER — Telehealth: Payer: Self-pay

## 2022-04-04 DIAGNOSIS — Z79899 Other long term (current) drug therapy: Secondary | ICD-10-CM

## 2022-04-04 NOTE — Telephone Encounter (Signed)
-----   Message from Dyann Kief, PA-C sent at 04/03/2022  7:26 AM EST ----- Kidney function up to 1.4 and glucose high 153. Please ask if patient is on losartan-started by Dr. Gala Romney in June but may have been stopped due to hypotension. Also ask when he took lasix last. Try to increase water intake and recheck bmet next week thanks

## 2022-04-10 ENCOUNTER — Telehealth: Payer: Self-pay | Admitting: Interventional Cardiology

## 2022-04-10 ENCOUNTER — Ambulatory Visit: Payer: Medicare Other | Attending: Physician Assistant

## 2022-04-10 DIAGNOSIS — Z79899 Other long term (current) drug therapy: Secondary | ICD-10-CM | POA: Diagnosis not present

## 2022-04-10 NOTE — Telephone Encounter (Signed)
Patient's daughter is requesting to discuss lab results.

## 2022-04-11 ENCOUNTER — Telehealth: Payer: Self-pay

## 2022-04-11 ENCOUNTER — Other Ambulatory Visit: Payer: Medicare Other

## 2022-04-11 DIAGNOSIS — Z79899 Other long term (current) drug therapy: Secondary | ICD-10-CM

## 2022-04-11 LAB — BASIC METABOLIC PANEL
BUN/Creatinine Ratio: 17 (ref 10–24)
BUN: 25 mg/dL (ref 8–27)
CO2: 19 mmol/L — ABNORMAL LOW (ref 20–29)
Calcium: 9.3 mg/dL (ref 8.6–10.2)
Chloride: 102 mmol/L (ref 96–106)
Creatinine, Ser: 1.48 mg/dL — ABNORMAL HIGH (ref 0.76–1.27)
Glucose: 123 mg/dL — ABNORMAL HIGH (ref 70–99)
Potassium: 5.5 mmol/L — ABNORMAL HIGH (ref 3.5–5.2)
Sodium: 135 mmol/L (ref 134–144)
eGFR: 52 mL/min/{1.73_m2} — ABNORMAL LOW (ref >59)

## 2022-04-11 NOTE — Telephone Encounter (Signed)
-----   Message from Dyann Kief, PA-C sent at 04/11/2022  7:00 AM EST ----- Kidney function higher and K up.. Last week when you spoke with his daughter in law she said he was taking losartan but it wasn't added to his med list. Please verify he's taking losartan 12.5 mg daily and ask him to stop. Make sure he's staying hydrated. Repeat bmet in 1 week. thanks

## 2022-04-11 NOTE — Telephone Encounter (Signed)
Spoke with daughter in law and she states that she knows that she confirmed that pt was taking Losartan but now she is unsure if he is actually taking it. I instructed her to check his medications when she got home. She is aware that pt is to stop taking Losartan if he is taking it. Pt is scheduled to come in next Thursday 12/21 for repeat labs.

## 2022-04-14 NOTE — Telephone Encounter (Signed)
See phone note from 04/11/2022.

## 2022-04-17 ENCOUNTER — Ambulatory Visit: Payer: Medicare Other | Attending: Physician Assistant

## 2022-04-18 ENCOUNTER — Other Ambulatory Visit (HOSPITAL_COMMUNITY): Payer: Self-pay | Admitting: Cardiology

## 2022-05-08 ENCOUNTER — Ambulatory Visit: Payer: Medicare Other | Attending: Physician Assistant

## 2022-05-08 ENCOUNTER — Other Ambulatory Visit (HOSPITAL_COMMUNITY): Payer: Self-pay

## 2022-05-08 ENCOUNTER — Telehealth (HOSPITAL_COMMUNITY): Payer: Self-pay

## 2022-05-08 DIAGNOSIS — Z79899 Other long term (current) drug therapy: Secondary | ICD-10-CM | POA: Diagnosis not present

## 2022-05-08 NOTE — Telephone Encounter (Signed)
Advanced Heart Failure Patient Advocate Encounter  This patient is eligible for an open grant that would cover the cost of Farxiga. Left message for patient to call back to start this process.  Clista Bernhardt, CPhT Rx Patient Advocate Phone: [phone]

## 2022-05-09 LAB — BASIC METABOLIC PANEL
BUN/Creatinine Ratio: 21 (ref 10–24)
BUN: 29 mg/dL — ABNORMAL HIGH (ref 8–27)
CO2: 22 mmol/L (ref 20–29)
Calcium: 9.4 mg/dL (ref 8.6–10.2)
Chloride: 100 mmol/L (ref 96–106)
Creatinine, Ser: 1.38 mg/dL — ABNORMAL HIGH (ref 0.76–1.27)
Glucose: 154 mg/dL — ABNORMAL HIGH (ref 70–99)
Potassium: 5 mmol/L (ref 3.5–5.2)
Sodium: 137 mmol/L (ref 134–144)
eGFR: 56 mL/min/{1.73_m2} — ABNORMAL LOW (ref >59)

## 2022-05-22 ENCOUNTER — Other Ambulatory Visit (HOSPITAL_COMMUNITY): Payer: Self-pay

## 2022-05-22 ENCOUNTER — Encounter (HOSPITAL_COMMUNITY): Payer: Self-pay

## 2022-05-22 ENCOUNTER — Ambulatory Visit (HOSPITAL_COMMUNITY)
Admission: RE | Admit: 2022-05-22 | Discharge: 2022-05-22 | Disposition: A | Discharge status: Home / Self Care | Payer: Medicare Other | Source: Ambulatory Visit | Attending: Cardiology | Admitting: Cardiology

## 2022-05-22 VITALS — BP 120/80 | HR 68 | Wt 169.0 lb

## 2022-05-22 DIAGNOSIS — N1831 Chronic kidney disease, stage 3a: Secondary | ICD-10-CM | POA: Insufficient documentation

## 2022-05-22 DIAGNOSIS — Z951 Presence of aortocoronary bypass graft: Secondary | ICD-10-CM | POA: Diagnosis not present

## 2022-05-22 DIAGNOSIS — E1122 Type 2 diabetes mellitus with diabetic chronic kidney disease: Secondary | ICD-10-CM | POA: Insufficient documentation

## 2022-05-22 DIAGNOSIS — I5022 Chronic systolic (congestive) heart failure: Secondary | ICD-10-CM | POA: Diagnosis not present

## 2022-05-22 DIAGNOSIS — Z7982 Long term (current) use of aspirin: Secondary | ICD-10-CM | POA: Diagnosis not present

## 2022-05-22 DIAGNOSIS — I350 Nonrheumatic aortic (valve) stenosis: Secondary | ICD-10-CM | POA: Diagnosis not present

## 2022-05-22 DIAGNOSIS — I2510 Atherosclerotic heart disease of native coronary artery without angina pectoris: Secondary | ICD-10-CM | POA: Diagnosis not present

## 2022-05-22 DIAGNOSIS — Z87891 Personal history of nicotine dependence: Secondary | ICD-10-CM | POA: Insufficient documentation

## 2022-05-22 DIAGNOSIS — Z952 Presence of prosthetic heart valve: Secondary | ICD-10-CM | POA: Diagnosis not present

## 2022-05-22 DIAGNOSIS — Z79899 Other long term (current) drug therapy: Secondary | ICD-10-CM | POA: Diagnosis not present

## 2022-05-22 DIAGNOSIS — I4819 Other persistent atrial fibrillation: Secondary | ICD-10-CM | POA: Diagnosis not present

## 2022-05-22 DIAGNOSIS — Z7984 Long term (current) use of oral hypoglycemic drugs: Secondary | ICD-10-CM | POA: Diagnosis not present

## 2022-05-22 DIAGNOSIS — I5020 Unspecified systolic (congestive) heart failure: Secondary | ICD-10-CM

## 2022-05-22 DIAGNOSIS — Z7901 Long term (current) use of anticoagulants: Secondary | ICD-10-CM | POA: Insufficient documentation

## 2022-05-22 DIAGNOSIS — E785 Hyperlipidemia, unspecified: Secondary | ICD-10-CM | POA: Insufficient documentation

## 2022-05-22 DIAGNOSIS — I252 Old myocardial infarction: Secondary | ICD-10-CM | POA: Insufficient documentation

## 2022-05-22 DIAGNOSIS — I5189 Other ill-defined heart diseases: Secondary | ICD-10-CM | POA: Diagnosis not present

## 2022-05-22 DIAGNOSIS — I255 Ischemic cardiomyopathy: Secondary | ICD-10-CM | POA: Diagnosis not present

## 2022-05-22 LAB — CBC
HCT: 34.7 % — ABNORMAL LOW (ref 39.0–52.0)
Hemoglobin: 12 g/dL — ABNORMAL LOW (ref 13.0–17.0)
MCH: 31.2 pg (ref 26.0–34.0)
MCHC: 34.6 g/dL (ref 30.0–36.0)
MCV: 90.1 fL (ref 80.0–100.0)
Platelets: 212 10*3/uL (ref 150–400)
RBC: 3.85 MIL/uL — ABNORMAL LOW (ref 4.22–5.81)
RDW: 14.1 % (ref 11.5–15.5)
WBC: 11.2 10*3/uL — ABNORMAL HIGH (ref 4.0–10.5)
nRBC: 0 % (ref 0.0–0.2)

## 2022-05-22 LAB — DIGOXIN LEVEL: Digoxin Level: <0.2 ng/mL — ABNORMAL LOW (ref 0.8–2.0)

## 2022-05-22 LAB — COMPREHENSIVE METABOLIC PANEL
ALT: 14 U/L (ref 0–44)
AST: 25 U/L (ref 15–41)
Albumin: 3.8 g/dL (ref 3.5–5.0)
Alkaline Phosphatase: 90 U/L (ref 38–126)
Anion gap: 9 (ref 5–15)
BUN: 22 mg/dL (ref 8–23)
CO2: 23 mmol/L (ref 22–32)
Calcium: 9.7 mg/dL (ref 8.9–10.3)
Chloride: 105 mmol/L (ref 98–111)
Creatinine, Ser: 1.59 mg/dL — ABNORMAL HIGH (ref 0.61–1.24)
GFR, Estimated: 47 mL/min — ABNORMAL LOW (ref >60)
Glucose, Bld: 147 mg/dL — ABNORMAL HIGH (ref 70–99)
Potassium: 4.7 mmol/L (ref 3.5–5.1)
Sodium: 137 mmol/L (ref 135–145)
Total Bilirubin: 0.7 mg/dL (ref 0.3–1.2)
Total Protein: 7.5 g/dL (ref 6.5–8.1)

## 2022-05-22 LAB — LIPID PANEL
Cholesterol: 95 mg/dL (ref 0–200)
HDL: 26 mg/dL — ABNORMAL LOW (ref >40)
LDL Cholesterol: 53 mg/dL (ref 0–99)
Total CHOL/HDL Ratio: 3.7 RATIO
Triglycerides: 81 mg/dL (ref <150)
VLDL: 16 mg/dL (ref 0–40)

## 2022-05-22 LAB — BRAIN NATRIURETIC PEPTIDE: B Natriuretic Peptide: 335.3 pg/mL — ABNORMAL HIGH (ref 0.0–100.0)

## 2022-05-22 MED ORDER — DAPAGLIFLOZIN PROPANEDIOL 10 MG PO TABS: 10.0000 mg | ORAL_TABLET | Freq: Every day | ORAL | 8 refills | Status: DC

## 2022-05-22 NOTE — Patient Instructions (Signed)
Thank you for coming in today  Labs were done today, if any labs are abnormal the clinic will call you No news is good news  RESTART Farxiga 10 mg 1 tablet daily   Your physician recommends that you schedule a follow-up appointment in:  6 months with Dr. Haroldine Laws You will receive a reminder letter in the mail a few months in advance. If you don't receive a letter, please call our office to schedule the follow-up appointment.    Do the following things EVERYDAY: Weigh yourself in the morning before breakfast. Write it down and keep it in a log. Take your medicines as prescribed Eat low salt foods--Limit salt (sodium) to 2000 mg per day.  Stay as active as you can everyday Limit all fluids for the day to less than 2 liters  At the [facility], you and your health needs are our priority. As part of our continuing mission to provide you with exceptional heart care, we have created designated Provider Care Teams. These Care Teams include your primary Cardiologist (physician) and Advanced Practice Providers (APPs- Physician Assistants and Nurse Practitioners) who all work together to provide you with the care you need, when you need it.   You may see any of the following providers on your designated Care Team at your next follow up: Dr Glori Bickers Dr Loralie Champagne Dr. Roxana Hires, NP Lyda Jester, [facility] [place], [place] Forestine Na, NP Audry Riles, PharmD   Please be sure to bring in all your medications bottles to every appointment.   If you have any questions or concerns before your next appointment please send [place] a message through [place] or call our office at [phone].    TO LEAVE A MESSAGE FOR THE NURSE SELECT OPTION 2, PLEASE LEAVE A MESSAGE INCLUDING: YOUR NAME DATE OF BIRTH CALL BACK NUMBER REASON FOR CALL**this is important as we prioritize the call backs  YOU WILL RECEIVE A CALL BACK THE SAME DAY AS LONG AS  YOU CALL BEFORE 4:00 PM

## 2022-05-22 NOTE — Progress Notes (Signed)
ADVANCED HF CLINIC NOTE  Primary Care: Remo Lipps, PA HF Cardiologist: Dr. Gala Romney  HPI: Antonio Cervantes 67 y.o. male w/ T2DM, remote tobacco use, systolic heart failure/iCM, CAD, AF, and HLD.   Admitted 1/23 for NSTEMI and found to be in acute CHF and afib w/ RVR, of unknown duration but spontaneously converted back to NSR. Echo with severely reduced LVEF, 20-25%, RV normal. LHC showed severe MVCAD, however given diffuse, distal vessel disease, there were no good options for revascularization (Diffuse inoperable CAD, targets not amendable to PCI). RHC showed normal filling pressures and normal CO  5.4 L/min, cardiac index 2.88. Medical therapy for CAD was recommended. Placed on ASA, statin and ? blocker. GDMT limited by soft BP and renal insuffiency, SCr ~1.5 during hospitalization (baseline unknown). Eliquis started for Afib. Referred to [facility] clinic.    Of note, there was also concern for potential aortic stenosis base on echo interpretation, "degree of AS hard to judge due to severe decrease in LV function DVI 0.55 AVA 2 cm2 but morphology and gradients suggest AS could be moderate" .   Seen in [facility] 2/23, stable NYHA I-II symptoms. GDMT titrated. Follow up in AHF 3/23 with stable NYHA II symptoms, euvolemic. Started on low-dose losartan.  Re-admitted 3/23 with NSTEMI. Underwent cMRI showing LVEF 47%, normal RVEF, viable multi-vessel disease. Moderate AI. CVTS consulted and underwent CABG x 4 and aortic valve replacement. Course complicated by persistent AF despite amiodarone and started on Coumadin (plan to continue for 3 months then switch to Eliquis). [name], beta blocker, and Lasix stopped with low BP and bradycardia. Discharged home, weight 163 lbs.  Repeat echo 10/18/21 EF normalized, 55-60% G2DD Aov ok.   He presents today for routine f/u. Reports doing well. Denies CP. No resting dyspnea, orthopnea/ PND. No LEE. NYHA Class II.  Compliant w/ medications but says he ran out of  [place] 3 wks ago. Unable to afford refills due to cost. Per pharmacy team, he is eligible for an open grant.   BP 120/80. EKG shows NSR 68 bpm,    Cardiac Testing  - Echo 6/23 EF 55-60%, GDDD, AoV ok   - cMRI (3/23): LVEF 47%, normal RVEF, mild to moderate AI, study suggests viable multi-vessel disease.  - Echo (3/23): EF 50%, mild LVH, RV ok, mild AI  - Echo (1/23): EF 20-25%, severe LV dysfunction, mild LVH, RV ok, mild MR, degree of AS hard to judge due to severe decrease in LV function DVI 0.55 AVA 2 cm2 but morphology and gradients suggest AS could be moderate .   - LHC/RHC (05/21/21):   Prox LAD to Mid LAD lesion is 75% stenosed.   Dist LAD lesion is 80% stenosed.   1st Diag lesion is 80% stenosed.   Ost RCA to Mid RCA lesion is 99% stenosed.   2nd Mrg lesion is 99% stenosed.   Dist Cx lesion is 90% stenosed.   Prox Cx to Mid Cx lesion is 75% stenosed.   LV end diastolic pressure is normal.   There is no aortic valve stenosis.   Severe, multivessel coronary artery disease.   Normal LVEDP. Given diffuse, distal vessel disease, there are no good options for revascularization.   Right heart pressures: RA mean 2 PA pressure 26/10 mean 17 PCWP mean 6 CO/CI 5.4/2.88 (Fick)  ROS: All systems reviewed and negative except as per HPI.   Past Medical History:  Diagnosis Date   Diabetes mellitus without complication (HCC)    Diabetic peripheral  neuropathy associated with type 2 diabetes mellitus (HCC)    Current Outpatient Medications  Medication Sig Dispense Refill   allopurinol (ZYLOPRIM) 100 MG tablet Take 200 mg by mouth daily.     aspirin 81 MG EC tablet Take 1 tablet (81 mg total) by mouth daily. Swallow whole. 30 tablet 11   atorvastatin (LIPITOR) 80 MG tablet Take 1 tablet (80 mg total) by mouth daily. 30 tablet 0   digoxin (LANOXIN) 0.125 MG tablet Take 0.5 tablets (0.0625 mg total) by mouth daily. 30 tablet 1   ELIQUIS 5 MG TABS tablet TAKE 1 TABLET BY MOUTH TWICE A  DAY 30 tablet 6   ezetimibe (ZETIA) 10 MG tablet Take 1 tablet (10 mg total) by mouth daily. 90 tablet 3   furosemide (LASIX) 20 MG tablet Take 1 tablet (20 mg total) by mouth daily as needed for fluid. 90 tablet 1   indomethacin (INDOCIN) 25 MG capsule PLEASE SEE ATTACHED FOR DETAILED DIRECTIONS     levothyroxine (SYNTHROID) 112 MCG tablet Take 112 mcg by mouth daily.     metFORMIN (GLUCOPHAGE) 850 MG tablet Take 850 mg by mouth 2 (two) times daily with a meal.     metoprolol succinate (TOPROL XL) 25 MG 24 hr tablet Take 1 tablet (25 mg total) by mouth daily. 90 tablet 3   dapagliflozin propanediol (FARXIGA) 10 MG TABS tablet Take 1 tablet (10 mg total) by mouth daily. 30 tablet 8   No current facility-administered medications for this encounter.   No Known Allergies  Social History   Socioeconomic History   Marital status: Married    Spouse name: Not on file   Number of children: 1   Years of education: Not on file   Highest education level: 10th grade  Occupational History   Occupation: retired  Tobacco Use   Smoking status: Former    Packs/day: 1.50    Years: 15.00    Total pack years: 22.50    Types: Cigarettes    Quit date: 1983    Years since quitting: 41.0   Smokeless tobacco: Never  Vaping Use   Vaping Use: Never used  Substance and Sexual Activity   Alcohol use: No   Drug use: No   Sexual activity: Not on file  Other Topics Concern   Not on file  Social History Narrative   Not on file   Social Determinants of Health   Financial Resource Strain: Low Risk  (05/21/2021)   Overall Financial Resource Strain (CARDIA)    Difficulty of Paying Living Expenses: Not very hard  Food Insecurity: No Food Insecurity (05/21/2021)   Hunger Vital Sign    Worried About Running Out of Food in the Last Year: Never true    [place] in the Last Year: Never true  Transportation Needs: No Transportation Needs (05/21/2021)   PRAPARE - Hydrologist  (Medical): No    Lack of Transportation (Non-Medical): No  Physical Activity: Not on file  Stress: Not on file  Social Connections: Not on file  Intimate Partner Violence: Not on file   No family history on file.  Wt Readings from Last 3 Encounters:  05/22/22 76.7 kg (169 lb)  04/02/22 77.1 kg (170 lb)  10/18/21 71.2 kg (157 lb)   BP 120/80   Pulse 68   Wt 76.7 kg (169 lb)   SpO2 97%   BMI 29.94 kg/m   PHYSICAL EXAM: General:  Well appearing. No respiratory  difficulty HEENT: normal Neck: supple. no JVD. Carotids 2+ bilat; no bruits. No lymphadenopathy or thyromegaly appreciated. Cor: PMI nondisplaced. Regular rate & rhythm. No rubs, gallops or murmurs. Lungs: clear Abdomen: soft, nontender, nondistended. No hepatosplenomegaly. No bruits or masses. Good bowel sounds. Extremities: no cyanosis, clubbing, rash, edema Neuro: alert & oriented x 3, cranial nerves grossly intact. moves all 4 extremities w/o difficulty. Affect pleasant.    ASSESSMENT & PLAN:  1. 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  - Echo 1/23 EF 20-25%, RV ok - LHC (1/23): w/ severe MVCAD. RHC w/ normal filling pressures and preserved CO 5.4 L/min, cardiac index 2.88 - Echo (3/23): improved EF 50%, mild LVH, RV ok, mild AI - cMRI( 3/23): LVEF 47%, RVEF 48%, viable multi-vessel disease, moderate AI - s/p CABG 4/23 - Echo 6/23 EF  normalized, 55-60% G2DD Aov ok   - NYHA Class II. Euvolemic on exam  - Continue Toprol XL 12.5 mg daily. - Restart Farxiga 10 mg daily. He will re-enroll in grant program. Pharmacy team assisting  - Failed losartan, stopped due to hyperkalemia  - continue Lasix 20 mg PRN  - check CMP/BNP today    2. CAD - NSTEMI 1/23, LHC w/ severe, diffuse MVCAD--> medically managed. - NSTEMI 3/23, s/p CABG x 4, LIMA to LAD, SVG to diagonal, SVG to OM, SVG to distal RCA and AVR - stable w/o CP  - Continue ASA 81 + atorva 80 daily - Continue b-blocker - Check Lipid panel and CMP  today. LDL goal < 70    3. PAF - EKG today shows NSR  - continue metoprolol  - Eliquis 5 mg bid. Denies abnormal bleeding. Check CBC today   4. Aortic Stenosis  - s/p aortic valve replacement, stable on echo 6/23 - aware of SBE prophylaxis   5. CKD IIIa - Baseline SCr ~1.5 - Restart Farxiga. - check CMP today    6. Type 2DM - Hgb A1c 7.3  - followed by PCP  - restarting Farxiga 10    7. HLD w/ LDL Goal < 70  - Continue atorva 80 mg - LP/CMP today    8. Remote Tobacco Use - Quit 40 years ago.    F/u w/ Dr. Haroldine Laws in 6 months    Lyda Jester, PA-C  12:25 PM

## 2022-05-22 NOTE — Telephone Encounter (Signed)
Advanced Heart Failure Patient Advocate Encounter  The patient was approved for a [employer] grant that will help cover the cost of Farxiga.  Total amount awarded, $10,000.  Effective: 04/22/22 - 04/22/23.  BIN [id number] PCN [id number] [id number] ID [id number]  Patient provided with approval and processing information in office.  Clista Bernhardt, CPhT Rx Patient Advocate Phone: [phone]

## 2022-06-23 DIAGNOSIS — E039 Hypothyroidism, unspecified: Secondary | ICD-10-CM | POA: Diagnosis not present

## 2022-06-23 DIAGNOSIS — I10 Essential (primary) hypertension: Secondary | ICD-10-CM | POA: Diagnosis not present

## 2022-06-23 DIAGNOSIS — E1122 Type 2 diabetes mellitus with diabetic chronic kidney disease: Secondary | ICD-10-CM | POA: Diagnosis not present

## 2022-06-23 DIAGNOSIS — N182 Chronic kidney disease, stage 2 (mild): Secondary | ICD-10-CM | POA: Diagnosis not present

## 2022-06-23 DIAGNOSIS — R7989 Other specified abnormal findings of blood chemistry: Secondary | ICD-10-CM | POA: Diagnosis not present

## 2022-07-01 NOTE — Progress Notes (Unsigned)
Cardiology Office Note   Date:  07/02/2022   ID:  CHANDRA TROMP, DOB 1955-10-26, MRN [id number]  PCP:  Jeannene Patella, PA    No chief complaint on file.  CAD  Wt Readings from Last 3 Encounters:  07/02/22 171 lb (77.6 kg)  05/22/22 169 lb (76.7 kg)  04/02/22 170 lb (77.1 kg)       History of Present Illness: Antonio Cervantes is a 67 y.o. male   w/ T2DM, remote tobacco use, systolic heart failure/iCM, CAD, AF, and HLD.    Admitted 1/23 for NSTEMI and found to be in acute CHF and afib w/ RVR, of unknown duration but spontaneously converted back to NSR. Echo with severely reduced LVEF, 20-25%, RV normal. LHC showed severe MVCAD, however given diffuse, distal vessel disease, there were no good options for revascularization (Diffuse inoperable CAD, targets not amendable to PCI). RHC showed normal filling pressures and normal CO  5.4 L/min, cardiac index 2.88. Medical therapy for CAD was recommended. Placed on ASA, statin and ? blocker. GDMT limited by soft BP and renal insuffiency, SCr ~1.5 during hospitalization (baseline unknown). Eliquis started for Afib.  Of note, there was also concern for potential aortic stenosis base on echo interpretation, "degree of AS hard to judge due to severe decrease in LV function DVI 0.55 AVA 2 cm2 but morphology and gradients suggest AS could be moderate"  "Re-admitted 3/23 with NSTEMI. Underwent cMRI showing LVEF 47%, normal RVEF, viable multi-vessel disease. Moderate AI. CVTS consulted and underwent CABG x 4 and aortic valve replacement. Course complicated by persistent AF despite amiodarone and started on Coumadin (plan to continue for 3 months then switch to Eliquis). Arlyce Harman, beta blocker, and Lasix stopped with low BP and bradycardia. Discharged home, weight 163 lbs.  Repeat echo 10/18/21 EF normalized, 55-60% G2DD Aov ok. "  Past Medical History:  Diagnosis Date   Diabetes mellitus without complication ([place])    Diabetic peripheral  neuropathy associated with type 2 diabetes mellitus ([place])     Past Surgical History:  Procedure Laterality Date   AORTIC VALVE REPLACEMENT N/A 07/29/2021   Procedure: AORTIC VALVE REPLACEMENT (AVR) USING EDWARDS INSPIRIS AORTIC VALVE SIZE 23MM;  Surgeon: Gaye Pollack, MD;  Location: [place];  Service: Open Heart Surgery;  Laterality: N/A;   CORONARY ARTERY BYPASS GRAFT N/A 07/29/2021   Procedure: CORONARY ARTERY BYPASS GRAFTING (CABG) TIMES FOUR, USING LEFT INTERNAL MAMMARY ARTERY AND RIGHT GREATER SAPHENOUS VEIN HARVESTED ENDOSCOPICALLYY.;  Surgeon: Gaye Pollack, MD;  Location: [facility];  Service: Open Heart Surgery;  Laterality: N/A;   RIGHT/LEFT HEART CATH AND CORONARY ANGIOGRAPHY N/A 05/21/2021   Procedure: RIGHT/LEFT HEART CATH AND CORONARY ANGIOGRAPHY;  Surgeon: Jettie Booze, MD;  Location: [place] [facility];  Service: Cardiovascular;  Laterality: N/A;   TEE WITHOUT CARDIOVERSION N/A 07/29/2021   Procedure: TRANSESOPHAGEAL ECHOCARDIOGRAM (TEE);  Surgeon: Gaye Pollack, MD;  Location: [place];  Service: Open Heart Surgery;  Laterality: N/A;     Current Outpatient Medications  Medication Sig Dispense Refill   allopurinol (ZYLOPRIM) 100 MG tablet Take 200 mg by mouth daily.     aspirin 81 MG EC tablet Take 1 tablet (81 mg total) by mouth daily. Swallow whole. 30 tablet 11   atorvastatin (LIPITOR) 80 MG tablet Take 1 tablet (80 mg total) by mouth daily. 30 tablet 0   dapagliflozin propanediol (FARXIGA) 10 MG TABS tablet Take 1 tablet (10 mg total) by mouth daily. 30 tablet 8  digoxin (LANOXIN) 0.125 MG tablet Take 0.5 tablets (0.0625 mg total) by mouth daily. 30 tablet 1   ELIQUIS 5 MG TABS tablet TAKE 1 TABLET BY MOUTH TWICE A DAY 30 tablet 6   ezetimibe (ZETIA) 10 MG tablet Take 1 tablet (10 mg total) by mouth daily. 90 tablet 3   furosemide (LASIX) 20 MG tablet Take 1 tablet (20 mg total) by mouth daily as needed for fluid. 90 tablet 1   indomethacin (INDOCIN) 25 MG capsule PLEASE  SEE ATTACHED FOR DETAILED DIRECTIONS     levothyroxine (SYNTHROID) 112 MCG tablet Take 112 mcg by mouth daily.     metFORMIN (GLUCOPHAGE) 850 MG tablet Take 850 mg by mouth. 2 tablets in the AM and 1 in the PM     metoprolol succinate (TOPROL XL) 25 MG 24 hr tablet Take 1 tablet (25 mg total) by mouth daily. 90 tablet 3   No current facility-administered medications for this visit.    Allergies:   Patient has no known allergies.    Social History:  The patient  reports that he quit smoking about 41 years ago. His smoking use included cigarettes. He has a 22.50 pack-year smoking history. He has never used smokeless tobacco. He reports that he does not drink alcohol and does not use drugs.   Family History:  The patient's family history is not on file.    ROS:  Please see the history of present illness.   Otherwise, review of systems are positive for not going to the dentist.   All other systems are reviewed and negative.    PHYSICAL EXAM: VS:  BP 124/76   Pulse 66   Ht '5\' 6"'$  (1.676 m)   Wt 171 lb (77.6 kg)   SpO2 98%   BMI 27.60 kg/m  , BMI Body mass index is 27.6 kg/m. GEN: Well nourished, well developed, in no acute distress HEENT: some missing teeth Neck: no JVD, carotid bruits, or masses Cardiac: RRR; no murmurs, rubs, or gallops,no edema  Respiratory:  clear to auscultation bilaterally, normal work of breathing GI: soft, nontender, nondistended, + BS MS: no deformity or atrophy Skin: warm and dry, no rash Neuro:  Strength and sensation are intact Psych: euthymic mood, full affect    Recent Labs: 07/25/2021: TSH 12.447 07/30/2021: Magnesium 2.4 05/22/2022: ALT 14; B Natriuretic Peptide 335.3; BUN 22; Creatinine, Ser 1.59; Hemoglobin 12.0; Platelets 212; Potassium 4.7; Sodium 137   Lipid Panel    Component Value Date/Time   CHOL 95 05/22/2022 1229   TRIG 81 05/22/2022 1229   HDL 26 (L) 05/22/2022 1229   CHOLHDL 3.7 05/22/2022 1229   VLDL 16 05/22/2022 1229    LDLCALC 53 05/22/2022 1229     Other studies Reviewed: Additional studies/ records that were reviewed today with results demonstrating: labs.   ASSESSMENT AND PLAN:  CAD: s/p CABG.  No angina.  Continue aggressive secondary prevention.   Chronic systolic heart failure: improved LVEF.  OK to stop digoxin.  PAF: no palpitations.  S/p AVR: need SBE prophylaxis.  Amoxicillin 2 g one hour prior to dental treatments.  Needs regular dentist visits.  CKD IIIa: stay hydrated.  Type 2 DM:  Whole food, plant based diet.  A1C 7.8 in 2/24 despite weight loss.  Metformin was increased.   Hyperlipidemia: January 2024 total cholesterol 95 HDL 26 LDL 53 triglycerides 81 Former smoker: Quit 40 years ago.    Current medicines are reviewed at length with the patient today.  The patient  concerns regarding his medicines were addressed.  The following changes have been made:  stop Digoxin  Labs/ tests ordered today include:  No orders of the defined types were placed in this encounter.   Recommend 150 minutes/week of aerobic exercise Low fat, low carb, high fiber diet recommended  Disposition:   FU in 1 year   Signed, Larae Grooms, MD  07/02/2022 2:10 PM    [facility] [address], [place], [place]  [address] Phone: [phone]; Fax: [phone]

## 2022-07-02 ENCOUNTER — Ambulatory Visit: Payer: Medicare Other | Attending: Interventional Cardiology | Admitting: Interventional Cardiology

## 2022-07-02 ENCOUNTER — Encounter: Payer: Self-pay | Admitting: Interventional Cardiology

## 2022-07-02 VITALS — BP 124/76 | HR 66 | Ht 66.0 in | Wt 171.0 lb

## 2022-07-02 DIAGNOSIS — E785 Hyperlipidemia, unspecified: Secondary | ICD-10-CM | POA: Diagnosis not present

## 2022-07-02 DIAGNOSIS — I5189 Other ill-defined heart diseases: Secondary | ICD-10-CM | POA: Diagnosis not present

## 2022-07-02 DIAGNOSIS — I2510 Atherosclerotic heart disease of native coronary artery without angina pectoris: Secondary | ICD-10-CM

## 2022-07-02 DIAGNOSIS — I480 Paroxysmal atrial fibrillation: Secondary | ICD-10-CM

## 2022-07-02 DIAGNOSIS — Z952 Presence of prosthetic heart valve: Secondary | ICD-10-CM | POA: Diagnosis not present

## 2022-07-02 DIAGNOSIS — E1122 Type 2 diabetes mellitus with diabetic chronic kidney disease: Secondary | ICD-10-CM | POA: Diagnosis not present

## 2022-07-02 DIAGNOSIS — N183 Chronic kidney disease, stage 3 unspecified: Secondary | ICD-10-CM | POA: Diagnosis not present

## 2022-07-02 MED ORDER — AMOXICILLIN 500 MG PO TABS: ORAL_TABLET | ORAL | 3 refills | Status: DC

## 2022-07-02 NOTE — Patient Instructions (Signed)
Medication Instructions:  Your physician has recommended you make the following change in your medication:  Stop digoxin  *If you need a refill on your cardiac medications before your next appointment, please call your pharmacy*   Lab Work: none If you have labs (blood work) drawn today and your tests are completely normal, you will receive your results only by: [place] (if you have MyChart) OR A paper copy in the mail If you have any lab test that is abnormal or we need to change your treatment, we will call you to review the results.   Testing/Procedures: none   Follow-Up: At [facility], you and your health needs are our priority.  As part of our continuing mission to provide you with exceptional heart care, we have created designated Provider Care Teams.  These Care Teams include your primary Cardiologist (physician) and Advanced Practice Providers (APPs -  Physician Assistants and Nurse Practitioners) who all work together to provide you with the care you need, when you need it.  We recommend signing up for the patient portal called "MyChart".  Sign up information is provided on this After Visit Summary.  MyChart is used to connect with patients for Virtual Visits (Telemedicine).  Patients are able to view lab/test results, encounter notes, upcoming appointments, etc.  Non-urgent messages can be sent to your provider as well.   To learn more about what you can do with MyChart, go to [email or web address].    Your next appointment:   12 month(s)  Provider:   Larae Grooms, MD     Other Instructions  Your physician discussed the importance of taking an antibiotic prior to any dental, gastrointestinal, genitourinary procedures to prevent damage to the heart valves from infection. You were given a prescription for an antibiotic based on current SBE prophylaxis guidelines. Prescription was sent to your pharmacy

## 2022-07-11 ENCOUNTER — Inpatient Hospital Stay (HOSPITAL_COMMUNITY)
Admission: EM | Admit: 2022-07-11 | Discharge: 2022-07-14 | DRG: 291 | Disposition: A | Discharge status: Home / Self Care | Payer: Medicare Other | Attending: Internal Medicine | Admitting: Internal Medicine

## 2022-07-11 ENCOUNTER — Encounter (HOSPITAL_COMMUNITY): Payer: Self-pay

## 2022-07-11 ENCOUNTER — Emergency Department (HOSPITAL_COMMUNITY): Payer: Medicare Other

## 2022-07-11 DIAGNOSIS — I255 Ischemic cardiomyopathy: Secondary | ICD-10-CM | POA: Diagnosis not present

## 2022-07-11 DIAGNOSIS — Z79899 Other long term (current) drug therapy: Secondary | ICD-10-CM | POA: Diagnosis not present

## 2022-07-11 DIAGNOSIS — I130 Hypertensive heart and chronic kidney disease with heart failure and stage 1 through stage 4 chronic kidney disease, or unspecified chronic kidney disease: Principal | ICD-10-CM | POA: Diagnosis present

## 2022-07-11 DIAGNOSIS — N200 Calculus of kidney: Secondary | ICD-10-CM | POA: Diagnosis present

## 2022-07-11 DIAGNOSIS — Z7984 Long term (current) use of oral hypoglycemic drugs: Secondary | ICD-10-CM | POA: Diagnosis not present

## 2022-07-11 DIAGNOSIS — Z87891 Personal history of nicotine dependence: Secondary | ICD-10-CM | POA: Diagnosis not present

## 2022-07-11 DIAGNOSIS — I2510 Atherosclerotic heart disease of native coronary artery without angina pectoris: Secondary | ICD-10-CM | POA: Diagnosis present

## 2022-07-11 DIAGNOSIS — E785 Hyperlipidemia, unspecified: Secondary | ICD-10-CM | POA: Diagnosis not present

## 2022-07-11 DIAGNOSIS — Z7982 Long term (current) use of aspirin: Secondary | ICD-10-CM

## 2022-07-11 DIAGNOSIS — I252 Old myocardial infarction: Secondary | ICD-10-CM | POA: Diagnosis not present

## 2022-07-11 DIAGNOSIS — Z951 Presence of aortocoronary bypass graft: Secondary | ICD-10-CM | POA: Diagnosis not present

## 2022-07-11 DIAGNOSIS — M109 Gout, unspecified: Secondary | ICD-10-CM | POA: Diagnosis present

## 2022-07-11 DIAGNOSIS — E039 Hypothyroidism, unspecified: Secondary | ICD-10-CM | POA: Diagnosis present

## 2022-07-11 DIAGNOSIS — R079 Chest pain, unspecified: Secondary | ICD-10-CM

## 2022-07-11 DIAGNOSIS — Z952 Presence of prosthetic heart valve: Secondary | ICD-10-CM

## 2022-07-11 DIAGNOSIS — Z79890 Hormone replacement therapy (postmenopausal): Secondary | ICD-10-CM

## 2022-07-11 DIAGNOSIS — I5033 Acute on chronic diastolic (congestive) heart failure: Secondary | ICD-10-CM

## 2022-07-11 DIAGNOSIS — Z953 Presence of xenogenic heart valve: Secondary | ICD-10-CM

## 2022-07-11 DIAGNOSIS — R7989 Other specified abnormal findings of blood chemistry: Secondary | ICD-10-CM | POA: Diagnosis not present

## 2022-07-11 DIAGNOSIS — Z23 Encounter for immunization: Secondary | ICD-10-CM

## 2022-07-11 DIAGNOSIS — N1831 Chronic kidney disease, stage 3a: Secondary | ICD-10-CM | POA: Diagnosis not present

## 2022-07-11 DIAGNOSIS — R6889 Other general symptoms and signs: Secondary | ICD-10-CM | POA: Diagnosis not present

## 2022-07-11 DIAGNOSIS — E1142 Type 2 diabetes mellitus with diabetic polyneuropathy: Secondary | ICD-10-CM | POA: Diagnosis present

## 2022-07-11 DIAGNOSIS — I440 Atrioventricular block, first degree: Secondary | ICD-10-CM | POA: Diagnosis present

## 2022-07-11 DIAGNOSIS — I480 Paroxysmal atrial fibrillation: Secondary | ICD-10-CM | POA: Diagnosis present

## 2022-07-11 DIAGNOSIS — I509 Heart failure, unspecified: Secondary | ICD-10-CM

## 2022-07-11 DIAGNOSIS — I5031 Acute diastolic (congestive) heart failure: Secondary | ICD-10-CM | POA: Diagnosis not present

## 2022-07-11 DIAGNOSIS — E875 Hyperkalemia: Secondary | ICD-10-CM | POA: Diagnosis not present

## 2022-07-11 DIAGNOSIS — E1122 Type 2 diabetes mellitus with diabetic chronic kidney disease: Secondary | ICD-10-CM | POA: Diagnosis not present

## 2022-07-11 DIAGNOSIS — R0789 Other chest pain: Secondary | ICD-10-CM | POA: Diagnosis not present

## 2022-07-11 DIAGNOSIS — Z7901 Long term (current) use of anticoagulants: Secondary | ICD-10-CM

## 2022-07-11 DIAGNOSIS — N179 Acute kidney failure, unspecified: Secondary | ICD-10-CM | POA: Diagnosis present

## 2022-07-11 DIAGNOSIS — R0602 Shortness of breath: Secondary | ICD-10-CM

## 2022-07-11 DIAGNOSIS — Z743 Need for continuous supervision: Secondary | ICD-10-CM | POA: Diagnosis not present

## 2022-07-11 DIAGNOSIS — Z8249 Family history of ischemic heart disease and other diseases of the circulatory system: Secondary | ICD-10-CM

## 2022-07-11 LAB — BASIC METABOLIC PANEL
Anion gap: 11 (ref 5–15)
BUN: 34 mg/dL — ABNORMAL HIGH (ref 8–23)
CO2: 18 mmol/L — ABNORMAL LOW (ref 22–32)
Calcium: 9.2 mg/dL (ref 8.9–10.3)
Chloride: 104 mmol/L (ref 98–111)
Creatinine, Ser: 1.77 mg/dL — ABNORMAL HIGH (ref 0.61–1.24)
GFR, Estimated: 42 mL/min — ABNORMAL LOW (ref >60)
Glucose, Bld: 158 mg/dL — ABNORMAL HIGH (ref 70–99)
Potassium: 6 mmol/L — ABNORMAL HIGH (ref 3.5–5.1)
Sodium: 133 mmol/L — ABNORMAL LOW (ref 135–145)

## 2022-07-11 LAB — POTASSIUM
Potassium: 3.9 mmol/L (ref 3.5–5.1)
Potassium: 3.7 mmol/L (ref 3.5–5.1)

## 2022-07-11 LAB — CBC
HCT: 36.3 % — ABNORMAL LOW (ref 39.0–52.0)
Hemoglobin: 11.5 g/dL — ABNORMAL LOW (ref 13.0–17.0)
MCH: 30.4 pg (ref 26.0–34.0)
MCHC: 31.7 g/dL (ref 30.0–36.0)
MCV: 96 fL (ref 80.0–100.0)
Platelets: 289 10*3/uL (ref 150–400)
RBC: 3.78 MIL/uL — ABNORMAL LOW (ref 4.22–5.81)
RDW: 15 % (ref 11.5–15.5)
WBC: 11.6 10*3/uL — ABNORMAL HIGH (ref 4.0–10.5)
nRBC: 0 % (ref 0.0–0.2)

## 2022-07-11 LAB — HEPATIC FUNCTION PANEL
ALT: 12 U/L (ref 0–44)
AST: 20 U/L (ref 15–41)
Albumin: 3.4 g/dL — ABNORMAL LOW (ref 3.5–5.0)
Alkaline Phosphatase: 71 U/L (ref 38–126)
Bilirubin, Direct: <0.1 mg/dL (ref 0.0–0.2)
Total Bilirubin: 0.6 mg/dL (ref 0.3–1.2)
Total Protein: 6.5 g/dL (ref 6.5–8.1)

## 2022-07-11 LAB — BRAIN NATRIURETIC PEPTIDE: B Natriuretic Peptide: 669.5 pg/mL — ABNORMAL HIGH (ref 0.0–100.0)

## 2022-07-11 LAB — CBG MONITORING, ED
Glucose-Capillary: 132 mg/dL — ABNORMAL HIGH (ref 70–99)
Glucose-Capillary: 122 mg/dL — ABNORMAL HIGH (ref 70–99)

## 2022-07-11 LAB — TROPONIN I (HIGH SENSITIVITY)
Troponin I (High Sensitivity): 22 ng/L — ABNORMAL HIGH (ref <18)
Troponin I (High Sensitivity): 18 ng/L — ABNORMAL HIGH (ref <18)

## 2022-07-11 LAB — TSH: TSH: 11.138 u[IU]/mL — ABNORMAL HIGH (ref 0.350–4.500)

## 2022-07-11 LAB — GLUCOSE, RANDOM: Glucose, Bld: 119 mg/dL — ABNORMAL HIGH (ref 70–99)

## 2022-07-11 LAB — D-DIMER, QUANTITATIVE: D-Dimer, Quant: 1.15 ug/mL-FEU — ABNORMAL HIGH (ref 0.00–0.50)

## 2022-07-11 LAB — HIV ANTIBODY (ROUTINE TESTING W REFLEX): HIV Screen 4th Generation wRfx: NONREACTIVE

## 2022-07-11 MED ORDER — INSULIN ASPART 100 UNIT/ML IJ SOLN
0.0000 [IU] | Freq: Three times a day (TID) | INTRAMUSCULAR | Status: DC
  Administered 2022-07-13 (×2): 1 [IU] via SUBCUTANEOUS

## 2022-07-11 MED ORDER — ONDANSETRON HCL 4 MG/2ML IJ SOLN: 4.0000 mg | Freq: Four times a day (QID) | INTRAMUSCULAR | Status: DC | PRN

## 2022-07-11 MED ORDER — FUROSEMIDE 10 MG/ML IJ SOLN: 60.0000 mg | Freq: Two times a day (BID) | INTRAMUSCULAR | Status: DC

## 2022-07-11 MED ORDER — SODIUM ZIRCONIUM CYCLOSILICATE 10 G PO PACK
10.0000 g | PACK | ORAL | Status: AC
  Administered 2022-07-11: 10 g via ORAL
  Filled 2022-07-11: qty 1

## 2022-07-11 MED ORDER — ALBUTEROL SULFATE (2.5 MG/3ML) 0.083% IN NEBU
10.0000 mg | INHALATION_SOLUTION | Freq: Once | RESPIRATORY_TRACT | Status: AC
  Administered 2022-07-11: 10 mg via RESPIRATORY_TRACT
  Filled 2022-07-11: qty 12

## 2022-07-11 MED ORDER — ALBUTEROL SULFATE (2.5 MG/3ML) 0.083% IN NEBU: 2.5000 mg | INHALATION_SOLUTION | RESPIRATORY_TRACT | Status: DC | PRN

## 2022-07-11 MED ORDER — FUROSEMIDE 10 MG/ML IJ SOLN
60.0000 mg | Freq: Two times a day (BID) | INTRAMUSCULAR | Status: DC
  Administered 2022-07-12: 60 mg via INTRAVENOUS
  Filled 2022-07-11: qty 6

## 2022-07-11 MED ORDER — INSULIN ASPART 100 UNIT/ML IV SOLN
5.0000 [IU] | Freq: Once | INTRAVENOUS | Status: AC
  Administered 2022-07-11: 5 [IU] via INTRAVENOUS

## 2022-07-11 MED ORDER — FUROSEMIDE 10 MG/ML IJ SOLN
60.0000 mg | Freq: Once | INTRAMUSCULAR | Status: AC
  Administered 2022-07-11: 60 mg via INTRAVENOUS
  Filled 2022-07-11: qty 6

## 2022-07-11 MED ORDER — ACETAMINOPHEN 650 MG RE SUPP: 650.0000 mg | Freq: Four times a day (QID) | RECTAL | Status: DC | PRN

## 2022-07-11 MED ORDER — ONDANSETRON HCL 4 MG PO TABS: 4.0000 mg | ORAL_TABLET | Freq: Four times a day (QID) | ORAL | Status: DC | PRN

## 2022-07-11 MED ORDER — ACETAMINOPHEN 325 MG PO TABS: 650.0000 mg | ORAL_TABLET | Freq: Four times a day (QID) | ORAL | Status: DC | PRN

## 2022-07-11 MED ORDER — DEXTROSE 50 % IV SOLN
1.0000 | Freq: Once | INTRAVENOUS | Status: AC
  Administered 2022-07-11: 50 mL via INTRAVENOUS
  Filled 2022-07-11: qty 50

## 2022-07-11 MED ORDER — CALCIUM GLUCONATE-NACL 1-0.675 GM/50ML-% IV SOLN
1.0000 g | Freq: Once | INTRAVENOUS | Status: AC
  Administered 2022-07-11: 1000 mg via INTRAVENOUS
  Filled 2022-07-11: qty 50

## 2022-07-11 NOTE — ED Provider Triage Note (Signed)
Emergency Medicine Provider Triage Evaluation Note  Antonio Cervantes , a 67 y.o. male  was evaluated in triage.  Pt complains of chest tightness. Feels like he can't get a deep enough breath in. Denies any chest pain. Denies any n/v or lightheadedness. He reports this feels like he did before he had his open heart surgery. Reports he is compliant with his medication. Took three ASA prior to arrival.   Review of Systems  Positive:  Negative:   Physical Exam  BP (!) 194/76   Pulse 72   Temp 98 F (36.7 C) (Oral)   Resp (!) 22   Ht 5\' 6"  (1.676 m)   Wt 77.1 kg   SpO2 100%   BMI 27.44 kg/m  Gen:   Awake, no distress, not diaphoretic   Resp:  Normal effort  MSK:   Moves extremities without difficulty  Other:  Lungs clear.   Medical Decision Making  Medically screening exam initiated at 4:25 PM.  Appropriate orders placed.  MCCARTHY GOLDWASSER was informed that the remainder of the evaluation will be completed by another provider, this initial triage assessment does not replace that evaluation, and the importance of remaining in the ED until their evaluation is complete.  Chest pain order set placed.    Sherrell Puller, PA-C 07/11/22 1630

## 2022-07-11 NOTE — ED Triage Notes (Signed)
Pt BIBA from home. Since 0900, pt has had chest tightness, across chest, and SHOB. Pain eased with EMS , but still present. Pt states pain is the same whether sitting or standing.   324 ASA prior to arrival.   140/90 71 HR  97% 129 CBG  Of note, pt had triple bypass and valve replacement 08/06/21

## 2022-07-11 NOTE — ED Provider Notes (Signed)
[facility] Provider Note   CSN: [id number] Arrival date & time: 07/11/22  1540     History  Chief Complaint  Patient presents with   Chest Pain    Antonio Cervantes is a 67 y.o. male.  67 year old male with a history of heart failure with an EF of 20 to 25%, aortic valve replacement and atrial fibrillation on Eliquis, diabetes, CAD status post CABG who presents to the emergency department with shortness of breath.  Patient reports that this morning he woke up and started having shortness of breath.  Also reports some chest discomfort over his upper chest that radiates to his left shoulder.  Not exertional.  Not pleuritic.  No diaphoresis or vomiting.  Was concerned because this was similar to his symptoms when he had a MI in the past.  Says that he has been taking p.o. adequately recently.  Thinks that he may have had some medication changes and may be on potassium supplementation but is not sure.  No lower extremity swelling.  No cough, runny nose, sore throat, or fevers recently. Got 324 of aspirin prior to arrival.        Home Medications Prior to Admission medications   Medication Sig Start Date End Date Taking? Authorizing Provider  allopurinol (ZYLOPRIM) 100 MG tablet Take 200 mg by mouth daily.    [provider]  amoxicillin (AMOXIL) 500 MG tablet Take 4 tablets by mouth one hour prior to dental appointments 07/02/22   Jettie Booze, MD  aspirin 81 MG EC tablet Take 1 tablet (81 mg total) by mouth daily. Swallow whole. 05/23/21   Cheryln Manly, NP  atorvastatin (LIPITOR) 80 MG tablet Take 1 tablet (80 mg total) by mouth daily. 05/24/21   Shelly Coss, MD  dapagliflozin propanediol (FARXIGA) 10 MG TABS tablet Take 1 tablet (10 mg total) by mouth daily. 05/22/22   Simmons, Brittainy M, PA-C  ELIQUIS 5 MG TABS tablet TAKE 1 TABLET BY MOUTH TWICE A DAY 03/24/22   Bensimhon, Shaune Pascal, MD  ezetimibe (ZETIA) 10 MG  tablet Take 1 tablet (10 mg total) by mouth daily. 04/02/22   Imogene Burn, PA-C  furosemide (LASIX) 20 MG tablet Take 1 tablet (20 mg total) by mouth daily as needed for fluid. 09/12/21   Bensimhon, Shaune Pascal, MD  indomethacin (INDOCIN) 25 MG capsule PLEASE SEE ATTACHED FOR DETAILED DIRECTIONS 04/29/22   [provider]  levothyroxine (SYNTHROID) 112 MCG tablet Take 112 mcg by mouth daily. 02/22/22   [provider]  metFORMIN (GLUCOPHAGE) 850 MG tablet Take 850 mg by mouth. 2 tablets in the AM and 1 in the PM    [provider]  metoprolol succinate (TOPROL XL) 25 MG 24 hr tablet Take 1 tablet (25 mg total) by mouth daily. 09/12/21   Bensimhon, Shaune Pascal, MD      Allergies    Patient has no known allergies.    Review of Systems   Review of Systems  Physical Exam Updated Vital Signs BP 112/75   Pulse 74   Temp 98.5 F (36.9 C)   Resp 16   Ht 5\' 6"  (1.676 m)   Wt 77.1 kg   SpO2 95%   BMI 27.44 kg/m  Physical Exam Vitals and nursing note reviewed.  Constitutional:      General: He is not in acute distress.    Appearance: He is well-developed.  HENT:     Head: Normocephalic and  atraumatic.     Right Ear: External ear normal.     Left Ear: External ear normal.     Nose: Nose normal.  Eyes:     Extraocular Movements: Extraocular movements intact.     Conjunctiva/sclera: Conjunctivae normal.     Pupils: Pupils are equal, round, and reactive to light.  Cardiovascular:     Rate and Rhythm: Normal rate and regular rhythm.     Heart sounds: Murmur heard.  Pulmonary:     Effort: Pulmonary effort is normal. No respiratory distress.     Breath sounds: Rales (Bibasilar) present.  Abdominal:     General: There is no distension.     Palpations: Abdomen is soft. There is no mass.     Tenderness: There is no abdominal tenderness. There is no guarding.  Musculoskeletal:     Cervical back: Normal range of motion and neck supple.     Right lower leg: No edema.      Left lower leg: No edema.  Skin:    General: Skin is warm and dry.  Neurological:     Mental Status: He is alert. Mental status is at baseline.  Psychiatric:        Mood and Affect: Mood normal.        Behavior: Behavior normal.     ED Results / Procedures / Treatments   Labs (all labs ordered are listed, but only abnormal results are displayed) Labs Reviewed  BASIC METABOLIC PANEL - Abnormal; Notable for the following components:      Result Value   Sodium 133 (*)    Potassium 6.0 (*)    CO2 18 (*)    Glucose, Bld 158 (*)    BUN 34 (*)    Creatinine, Ser 1.77 (*)    GFR, Estimated 42 (*)    All other components within normal limits  CBC - Abnormal; Notable for the following components:   WBC 11.6 (*)    RBC 3.78 (*)    Hemoglobin 11.5 (*)    HCT 36.3 (*)    All other components within normal limits  BRAIN NATRIURETIC PEPTIDE - Abnormal; Notable for the following components:   B Natriuretic Peptide 669.5 (*)    All other components within normal limits  GLUCOSE, RANDOM - Abnormal; Notable for the following components:   Glucose, Bld 119 (*)    All other components within normal limits  HEPATIC FUNCTION PANEL - Abnormal; Notable for the following components:   Albumin 3.4 (*)    All other components within normal limits  D-DIMER, QUANTITATIVE - Abnormal; Notable for the following components:   D-Dimer, Quant 1.15 (*)    All other components within normal limits  TSH - Abnormal; Notable for the following components:   TSH 11.138 (*)    All other components within normal limits  CBG MONITORING, ED - Abnormal; Notable for the following components:   Glucose-Capillary 132 (*)    All other components within normal limits  CBG MONITORING, ED - Abnormal; Notable for the following components:   Glucose-Capillary 122 (*)    All other components within normal limits  TROPONIN I (HIGH SENSITIVITY) - Abnormal; Notable for the following components:   Troponin I (High  Sensitivity) 22 (*)    All other components within normal limits  TROPONIN I (HIGH SENSITIVITY) - Abnormal; Notable for the following components:   Troponin I (High Sensitivity) 18 (*)    All other components within normal limits  RESPIRATORY PANEL BY  PCR  POTASSIUM  POTASSIUM  HIV ANTIBODY (ROUTINE TESTING W REFLEX)  POTASSIUM  POTASSIUM  HEMOGLOBIN A1C  POTASSIUM  POTASSIUM  POTASSIUM    EKG EKG Interpretation  Date/Time:  Friday July 11 2022 15:51:05 EDT Ventricular Rate:  73 PR Interval:  440 QRS Duration: 94 QT Interval:  370 QTC Calculation: 407 R Axis:   22 Text Interpretation: Sinus rhythm with 1st degree A-V block Minimal voltage criteria for LVH, may be normal variant ( Cornell product ) Nonspecific ST and T wave abnormality Abnormal ECG When compared with ECG of 22-May-2022 12:22, PREVIOUS ECG IS PRESENT Confirmed by Margaretmary Eddy [phone]) on 07/11/2022 5:14:49 PM  Radiology DG Chest 2 View  Result Date: 07/11/2022 CLINICAL DATA:  Chest pain. EXAM: CHEST - 2 VIEW COMPARISON:  08/28/2021 FINDINGS: Patchy atelectasis or infiltrate noted at the lung bases with tiny bilateral pleural effusions evident. No pulmonary edema. The cardiopericardial silhouette is within normal limits for size. The visualized bony structures of the thorax are unremarkable. IMPRESSION: Patchy atelectasis or infiltrate at the lung bases with tiny bilateral pleural effusions. Electronically Signed   By: Misty Stanley M.D.   On: 07/11/2022 16:45    Procedures Procedures   Ultrasound Guided Peripheral IV Indication: difficult to access - nursing staff unable to secure adequate peripheral IV access Location: R Antecubital Catheter Size: 18 G  Static views used to identify the target vein then usual prep with Chloraprep. IV placed on first attempt under dynamic US guidance. Dark red flash noted, and catheter advanced smoothly into the vein. NS saline flushed without resistance, witnessed swelling,  or patient discomfort. IV secured with I-site and tegaderm. The patient tolerated the procedure well. Performed By: Margaretmary Eddy MD   Medications Ordered in ED Medications  insulin aspart (novoLOG) injection 0-6 Units (has no administration in time range)  acetaminophen (TYLENOL) tablet 650 mg (has no administration in time range)    Or  acetaminophen (TYLENOL) suppository 650 mg (has no administration in time range)  ondansetron (ZOFRAN) tablet 4 mg (has no administration in time range)    Or  ondansetron (ZOFRAN) injection 4 mg (has no administration in time range)  albuterol (PROVENTIL) (2.5 MG/3ML) 0.083% nebulizer solution 2.5 mg (has no administration in time range)  furosemide (LASIX) injection 60 mg (has no administration in time range)  calcium gluconate 1 g/ 50 mL sodium chloride IVPB (0 mg Intravenous Stopped 07/11/22 1958)  furosemide (LASIX) injection 60 mg (60 mg Intravenous Given 07/11/22 1814)  sodium zirconium cyclosilicate (LOKELMA) packet 10 g (10 g Oral Given 07/11/22 1812)  albuterol (PROVENTIL) (2.5 MG/3ML) 0.083% nebulizer solution 10 mg (10 mg Nebulization Given 07/11/22 1820)  insulin aspart (novoLOG) injection 5 Units (5 Units Intravenous Given 07/11/22 1808)  dextrose 50 % solution 50 mL (50 mLs Intravenous Given 07/11/22 1810)    ED Course/ Medical Decision Making/ A&P Clinical Course as of 07/12/22 0041  Fri Jul 11, 2022  1713 Chest x-ray with patchy atelectasis versus infiltrate lung bases with effusions [RP]  1730 Pt not in room.  [RP]  1938 Dr Marcello Moores [RP]    Clinical Course User Index [RP] Fransico Meadow, MD                            Medical Decision Making Amount and/or Complexity of Data Reviewed Labs: ordered. Radiology: ordered.  Risk OTC drugs. Prescription drug management. Decision regarding hospitalization.   DHYAAN STRAKA is a 67  y.o. male with comorbidities that complicate the patient evaluation including heart failure with  an EF of 20 to 25%, aortic valve replacement and atrial fibrillation on Eliquis, diabetes, CAD status post CABG who presents to the emergency department with shortness of breath.   Initial Ddx:  MI, CHF exacerbation, PE, pneumonia MDM:  Unclear exactly what is causing the patient's symptoms at this time.  Does have bibasilar Rales but could potentially be reflective of heart failure exacerbation.  Is on Eliquis so feel that PE is highly unlikely.  Will obtain BNP and chest x-ray to evaluate for heart failure exacerbation and will also evaluate for infiltrates on his chest x-ray that would indicate pneumonia.  Will send troponins and EKG to evaluate for MI.  Plan:  Labs Troponin BNP Chest x-ray EKG  ED Summary/Re-evaluation:  Patient had labs sent that showed that he was hyperkalemic.  EKG did show worsening first-degree heart block.  Was given calcium and was shifted and also given Lokelma.  Was given Lasix for his hyperkalemia but also was found to have possible mild pulmonary edema on his chest x-ray so we will help with heart failure exacerbation as well.  Troponin is mildly elevated but flat.  Feel that MI is less likely.  Given his hyperkalemia was admitted to medicine for further management.   This patient presents to the ED for concern of complaints listed in HPI, this involves an extensive number of treatment options, and is a complaint that carries with it a high risk of complications and morbidity. Disposition including potential need for admission considered.   Dispo: Admit to Floor  Additional history obtained from son Records reviewed Outpatient Clinic Notes The following labs were independently interpreted: Chemistry and show CKD I independently reviewed the following imaging with scope of interpretation limited to determining acute life threatening conditions related to emergency care: Chest x-ray and agree with the radiologist interpretation with the following exceptions:  none I personally reviewed and interpreted cardiac monitoring: normal sinus rhythm  I personally reviewed and interpreted the pt's EKG: see above for interpretation  I have reviewed the patients home medications and made adjustments as needed Consults: Hospitalist  Final Clinical Impression(s) / ED Diagnoses Final diagnoses:  Hyperkalemia  Shortness of breath  Chest pain, unspecified type    Rx / DC Orders ED Discharge Orders     None      CRITICAL CARE Performed by: Fransico Meadow   Total critical care time: 30 minutes  Critical care time was exclusive of separately billable procedures and treating other patients.  Critical care was necessary to treat or prevent imminent or life-threatening deterioration.  Critical care was time spent personally by me on the following activities: development of treatment plan with patient and/or surrogate as well as nursing, discussions with consultants, evaluation of patient's response to treatment, examination of patient, obtaining history from patient or surrogate, ordering and performing treatments and interventions, ordering and review of laboratory studies, ordering and review of radiographic studies, pulse oximetry and re-evaluation of patient's condition.    Fransico Meadow, MD 07/12/22 [phone]

## 2022-07-11 NOTE — H&P (Addendum)
History and Physical    Antonio Cervantes [id number] DOB: July 08, 1955 DOA: 07/11/2022  PCP: Corky Crafts, MD  Patient coming from: home  I have personally briefly reviewed patient's old medical records in [facility]  Chief Complaint: chest tightness/ sob  HPI: Antonio Cervantes is a 67 y.o. male with medical history significant of  DMII,diabetic peripheral neuropathy, HTN, gout , HLD, hypothyroidism,systolic heart failure/iCM hx of ref now Pef s/p recovery with ef 55%  6/23, CADs/p CABG, AF on eliquis, who presents to ED with complaint of Chest tightness that started shortly after waking this am. Patient notes chest discomfort across chest with radiation to shoulder , associated with sob but no n/v/presyncope/ diaphoresis or pleuritic component noted.  Patient does state had similar symptoms with his MI due to this he presented to ED. He current states s/p treatment in ED his symptoms have resolved.   ED Course:  Patient on evaluation found to have acute on chronic chf exacerbation as well as Mild aki with  hyperkalemia, ( ? Use of supplements at home) Admitted for further treatment  Vitals:  Afeb, bp 194/76, hr 72 , rr 22 , sat 100% Labs  Wbc 11.6, hgb 11.5 ( 12) plt 289  Na 133 (137),  K 6 ,bicarb 18, K 6, glucose158 cr 1.77 base 1.4 CE22,18 BNP 669.5 Sinus rhythm with 1st degree AV block, no hyperacute st -twave changes cxr IMPRESSION: Patchy atelectasis or infiltrate at the lung bases with tiny bilateral pleural effusions  Tx lasix 60mg  ic x 1 lokelma, d50, 5 units insulin, albuterol for tx of hyperkalemia    Review of Systems: As per HPI otherwise 10 point review of systems negative.   Past Medical History:  Diagnosis Date   Diabetes mellitus without complication (HCC)    Diabetic peripheral neuropathy associated with type 2 diabetes mellitus (HCC)     Past Surgical History:  Procedure Laterality Date   AORTIC VALVE REPLACEMENT N/A 07/29/2021    Procedure: AORTIC VALVE REPLACEMENT (AVR) USING EDWARDS INSPIRIS AORTIC VALVE SIZE ;  Surgeon: Alleen Borne, MD;  Location: [facility] OR;  Service: Open Heart Surgery;  Laterality: N/A;   CORONARY ARTERY BYPASS GRAFT N/A 07/29/2021   Procedure: CORONARY ARTERY BYPASS GRAFTING (CABG) TIMES FOUR, USING LEFT INTERNAL MAMMARY ARTERY AND RIGHT GREATER SAPHENOUS VEIN HARVESTED ENDOSCOPICALLYY.;  Surgeon: Alleen Borne, MD;  Location: [facility] OR;  Service: Open Heart Surgery;  Laterality: N/A;   RIGHT/LEFT HEART CATH AND CORONARY ANGIOGRAPHY N/A 05/21/2021   Procedure: RIGHT/LEFT HEART CATH AND CORONARY ANGIOGRAPHY;  Surgeon: Corky Crafts, MD;  Location: [facility] INVASIVE CV LAB;  Service: Cardiovascular;  Laterality: N/A;   TEE WITHOUT CARDIOVERSION N/A 07/29/2021   Procedure: TRANSESOPHAGEAL ECHOCARDIOGRAM (TEE);  Surgeon: Alleen Borne, MD;  Location: [facility] OR;  Service: Open Heart Surgery;  Laterality: N/A;     reports that he quit smoking about 41 years ago. His smoking use included cigarettes. He has a 22.50 pack-year smoking history. He has never used smokeless tobacco. He reports that he does not drink alcohol and does not use drugs.  No Known Allergies  Family History  Problem Relation Age of Onset   Heart attack Maternal Uncle    Sudden Cardiac Death Neg Hx     Prior to Admission medications   Medication Sig Start Date End Date Taking? Authorizing Provider  allopurinol (ZYLOPRIM) 100 MG tablet Take 200 mg by mouth daily.    [provider]  amoxicillin (AMOXIL) 500 MG  tablet Take 4 tablets by mouth one hour prior to dental appointments 07/02/22   Corky Crafts, MD  aspirin 81 MG EC tablet Take 1 tablet (81 mg total) by mouth daily. Swallow whole. 05/23/21   Arty Baumgartner, NP  atorvastatin (LIPITOR) 80 MG tablet Take 1 tablet (80 mg total) by mouth daily. 05/24/21   Burnadette Pop, MD  dapagliflozin propanediol (FARXIGA) 10 MG TABS tablet Take 1 tablet (10 mg total) by mouth  daily. 05/22/22   Simmons, Brittainy M, PA-C  ELIQUIS 5 MG TABS tablet TAKE 1 TABLET BY MOUTH TWICE A DAY 03/24/22   Bensimhon, Bevelyn Buckles, MD  ezetimibe (ZETIA) 10 MG tablet Take 1 tablet (10 mg total) by mouth daily. 04/02/22   Dyann Kief, PA-C  furosemide (LASIX) 20 MG tablet Take 1 tablet (20 mg total) by mouth daily as needed for fluid. 09/12/21   Bensimhon, Bevelyn Buckles, MD  indomethacin (INDOCIN) 25 MG capsule PLEASE SEE ATTACHED FOR DETAILED DIRECTIONS 04/29/22   [provider]  levothyroxine (SYNTHROID) 112 MCG tablet Take 112 mcg by mouth daily. 02/22/22   [provider]  metFORMIN (GLUCOPHAGE) 850 MG tablet Take 850 mg by mouth. 2 tablets in the AM and 1 in the PM    [provider]  metoprolol succinate (TOPROL XL) 25 MG 24 hr tablet Take 1 tablet (25 mg total) by mouth daily. 09/12/21   Bensimhon, Bevelyn Buckles, MD    Physical Exam: Vitals:   07/11/22 1542 07/11/22 1544 07/11/22 1744 07/11/22 1930  BP:  (!) 194/76 (!) 181/79 (!) 149/98  Pulse:  72 61 88  Resp:  (!) 22 20 18   Temp:  98 F (36.7 C) 98 F (36.7 C)   TempSrc:  Oral Oral   SpO2:  100% 100% 98%  Weight: 77.1 kg     Height: 5\' 6"  (1.676 m)       Constitutional: NAD, calm, comfortable Vitals:   07/11/22 1542 07/11/22 1544 07/11/22 1744 07/11/22 1930  BP:  (!) 194/76 (!) 181/79 (!) 149/98  Pulse:  72 61 88  Resp:  (!) 22 20 18   Temp:  98 F (36.7 C) 98 F (36.7 C)   TempSrc:  Oral Oral   SpO2:  100% 100% 98%  Weight: 77.1 kg     Height: 5\' 6"  (1.676 m)      Eyes: PERRL, lids and conjunctivae normal ENMT: Mucous membranes are moist. Posterior pharynx clear of any exudate or lesions.Normal dentition.  Neck: normal, supple, no masses, no thyromegaly Respiratory: clear to auscultation bilaterally, no wheezing, faint crackles right base, diminished on left Normal respiratory effort. No accessory muscle use.  Cardiovascular: Regular rate and rhythm, no murmurs / rubs / gallops. No extremity  edema. 2+ pedal pulses.  Abdomen: no tenderness, no masses palpated. No hepatosplenomegaly. Bowel sounds positive.  Musculoskeletal: no clubbing / cyanosis. No joint deformity upper and lower extremities. Good ROM, no contractures. Normal muscle tone.  Skin: no rashes, lesions, ulcers. No induration Neurologic: CN 2-12 grossly intact. Sensation intact, Strength 5/5 in all 4.  Psychiatric: Normal judgment and insight. Alert and oriented x 3. Normal mood.    Labs on Admission: I have personally reviewed following labs and imaging studies  CBC: Recent Labs  Lab 07/11/22 1550  WBC 11.6*  HGB 11.5*  HCT 36.3*  MCV 96.0  PLT 289   Basic Metabolic Panel: Recent Labs  Lab 07/11/22 1550 07/11/22 1759  NA 133*  --   K 6.0*  --  CL 104  --   CO2 18*  --   GLUCOSE 158* 119*  BUN 34*  --   CREATININE 1.77*  --   CALCIUM 9.2  --    GFR: Estimated Creatinine Clearance: 39.6 mL/min (A) (by C-G formula based on SCr of 1.77 mg/dL (H)). Liver Function Tests: No results for input(s): "AST", "ALT", "ALKPHOS", "BILITOT", "PROT", "ALBUMIN" in the last 168 hours. No results for input(s): "LIPASE", "AMYLASE" in the last 168 hours. No results for input(s): "AMMONIA" in the last 168 hours. Coagulation Profile: No results for input(s): "INR", "PROTIME" in the last 168 hours. Cardiac Enzymes: No results for input(s): "CKTOTAL", "CKMB", "CKMBINDEX", "TROPONINI" in the last 168 hours. BNP (last 3 results) No results for input(s): "PROBNP" in the last 8760 hours. HbA1C: No results for input(s): "HGBA1C" in the last 72 hours. CBG: No results for input(s): "GLUCAP" in the last 168 hours. Lipid Profile: No results for input(s): "CHOL", "HDL", "LDLCALC", "TRIG", "CHOLHDL", "LDLDIRECT" in the last 72 hours. Thyroid Function Tests: No results for input(s): "TSH", "T4TOTAL", "FREET4", "T3FREE", "THYROIDAB" in the last 72 hours. Anemia Panel: No results for input(s): "VITAMINB12", "FOLATE",  "FERRITIN", "TIBC", "IRON", "RETICCTPCT" in the last 72 hours. Urine analysis:    Component Value Date/Time   COLORURINE STRAW (A) 07/28/2021 2100   APPEARANCEUR CLEAR 07/28/2021 2100   LABSPEC 1.010 07/28/2021 2100   PHURINE 5.0 07/28/2021 2100   GLUCOSEU >=500 (A) 07/28/2021 2100   HGBUR NEGATIVE 07/28/2021 2100   BILIRUBINUR NEGATIVE 07/28/2021 2100   KETONESUR NEGATIVE 07/28/2021 2100   PROTEINUR NEGATIVE 07/28/2021 2100   NITRITE NEGATIVE 07/28/2021 2100   LEUKOCYTESUR NEGATIVE 07/28/2021 2100    Radiological Exams on Admission: DG Chest 2 View  Result Date: 07/11/2022 CLINICAL DATA:  Chest pain. EXAM: CHEST - 2 VIEW COMPARISON:  08/28/2021 FINDINGS: Patchy atelectasis or infiltrate noted at the lung bases with tiny bilateral pleural effusions evident. No pulmonary edema. The cardiopericardial silhouette is within normal limits for size. The visualized bony structures of the thorax are unremarkable. IMPRESSION: Patchy atelectasis or infiltrate at the lung bases with tiny bilateral pleural effusions. Electronically Signed   By: Kennith Center M.D.   On: 07/11/2022 16:45    EKG: Independently reviewed.   Assessment/Plan  Acute on chronic Pef -lasix 60 mg iv bid  -cycle ce, check tsh, echo in am ( last echo 09/2021) -ekg without hyperacute st -twave changes  -cardiology consult   Hyperkalemia -hold all supplements -s/p tx in ED lokelma,lasix/albuterol /d50/insulin -repeat labs now   CADs/p CABG Chest pain  -ce flat ,continue to cycle -EKG no hyperacute findings -echo in am  -continue statin, metoprolol, asa,zetia     Atrial fibrillation - on eliquis -metoprolol   DMII -iss/fs  -continue farxiga -hold metformin for now   Diabetic peripheral neuropathy -no acute issues    HTN -continue metoprolol     Gout -continue allopurinol    HLD -continue atorvastatin ,zetia   Hypothyroidism -elevated tsh ? Noncompliance with synthroid vs euthyroid sick  syndrome -resume synthroid  -t3,t4 pending    DVT prophylaxis: on full dose OAC Code Status: full/ as discussed per patient wishes in event of cardiac arrest  Family Communication: Disposition Plan: patient  expected to be admitted greater than 2 midnights  Consults called: Cardiology Jonah Blue - page/epic msg sent  Admission status: inpatient ,progressive care   Lurline Del MD [facility]   If 7PM-7AM, please contact night-coverage [email or web address] Password [facility]  07/11/2022, 7:44 PM

## 2022-07-12 ENCOUNTER — Inpatient Hospital Stay (HOSPITAL_COMMUNITY): Payer: Medicare Other

## 2022-07-12 ENCOUNTER — Other Ambulatory Visit: Payer: Self-pay

## 2022-07-12 DIAGNOSIS — I5031 Acute diastolic (congestive) heart failure: Secondary | ICD-10-CM | POA: Diagnosis not present

## 2022-07-12 DIAGNOSIS — I5033 Acute on chronic diastolic (congestive) heart failure: Secondary | ICD-10-CM | POA: Diagnosis not present

## 2022-07-12 DIAGNOSIS — R7989 Other specified abnormal findings of blood chemistry: Secondary | ICD-10-CM

## 2022-07-12 LAB — RESPIRATORY PANEL BY PCR

## 2022-07-12 LAB — ECHOCARDIOGRAM COMPLETE
AR max vel: 2.08 cm2
AV Area VTI: 2.02 cm2
AV Area mean vel: 2.17 cm2
AV Mean grad: 6 mmHg
AV Peak grad: 12.4 mmHg
Ao pk vel: 1.76 m/s
Area-P 1/2: 3.33 cm2
Height: 66 in
S' Lateral: 3.6 cm
Weight: 2720 oz

## 2022-07-12 LAB — COMPREHENSIVE METABOLIC PANEL
ALT: 12 U/L (ref 0–44)
AST: 15 U/L (ref 15–41)
Albumin: 3.1 g/dL — ABNORMAL LOW (ref 3.5–5.0)
Alkaline Phosphatase: 60 U/L (ref 38–126)
Anion gap: 10 (ref 5–15)
BUN: 33 mg/dL — ABNORMAL HIGH (ref 8–23)
CO2: 19 mmol/L — ABNORMAL LOW (ref 22–32)
Calcium: 8.4 mg/dL — ABNORMAL LOW (ref 8.9–10.3)
Chloride: 107 mmol/L (ref 98–111)
Creatinine, Ser: 2.05 mg/dL — ABNORMAL HIGH (ref 0.61–1.24)
GFR, Estimated: 35 mL/min — ABNORMAL LOW (ref >60)
Glucose, Bld: 115 mg/dL — ABNORMAL HIGH (ref 70–99)
Potassium: 4.3 mmol/L (ref 3.5–5.1)
Sodium: 136 mmol/L (ref 135–145)
Total Bilirubin: 0.7 mg/dL (ref 0.3–1.2)
Total Protein: 5.9 g/dL — ABNORMAL LOW (ref 6.5–8.1)

## 2022-07-12 LAB — CBG MONITORING, ED
Glucose-Capillary: 135 mg/dL — ABNORMAL HIGH (ref 70–99)
Glucose-Capillary: 136 mg/dL — ABNORMAL HIGH (ref 70–99)

## 2022-07-12 LAB — POTASSIUM: Potassium: 4.7 mmol/L (ref 3.5–5.1)

## 2022-07-12 LAB — CBC
HCT: 27.5 % — ABNORMAL LOW (ref 39.0–52.0)
Hemoglobin: 9.2 g/dL — ABNORMAL LOW (ref 13.0–17.0)
MCH: 30.7 pg (ref 26.0–34.0)
MCHC: 33.5 g/dL (ref 30.0–36.0)
MCV: 91.7 fL (ref 80.0–100.0)
Platelets: 192 10*3/uL (ref 150–400)
RBC: 3 MIL/uL — ABNORMAL LOW (ref 4.22–5.81)
RDW: 15.1 % (ref 11.5–15.5)
WBC: 9.1 10*3/uL (ref 4.0–10.5)
nRBC: 0 % (ref 0.0–0.2)

## 2022-07-12 LAB — GLUCOSE, CAPILLARY
Glucose-Capillary: 138 mg/dL — ABNORMAL HIGH (ref 70–99)
Glucose-Capillary: 154 mg/dL — ABNORMAL HIGH (ref 70–99)

## 2022-07-12 LAB — T4, FREE: Free T4: 0.93 ng/dL (ref 0.61–1.12)

## 2022-07-12 LAB — PROCALCITONIN: Procalcitonin: <0.1 ng/mL

## 2022-07-12 MED ORDER — PNEUMOCOCCAL 20-VAL CONJ VACC 0.5 ML IM SUSY
0.5000 mL | PREFILLED_SYRINGE | INTRAMUSCULAR | Status: AC
  Administered 2022-07-13: 0.5 mL via INTRAMUSCULAR
  Filled 2022-07-12: qty 0.5

## 2022-07-12 MED ORDER — METOPROLOL SUCCINATE ER 25 MG PO TB24
25.0000 mg | ORAL_TABLET | Freq: Every day | ORAL | Status: DC
  Administered 2022-07-12 – 2022-07-14 (×3): 25 mg via ORAL
  Filled 2022-07-12 (×3): qty 1

## 2022-07-12 MED ORDER — LEVOTHYROXINE SODIUM 112 MCG PO TABS
112.0000 ug | ORAL_TABLET | Freq: Every day | ORAL | Status: DC
  Administered 2022-07-13 – 2022-07-14 (×2): 112 ug via ORAL
  Filled 2022-07-12 (×2): qty 1

## 2022-07-12 MED ORDER — DAPAGLIFLOZIN PROPANEDIOL 10 MG PO TABS: 10.0000 mg | ORAL_TABLET | Freq: Every day | ORAL | Status: DC

## 2022-07-12 MED ORDER — INFLUENZA VAC A&B SA ADJ QUAD 0.5 ML IM PRSY
0.5000 mL | PREFILLED_SYRINGE | INTRAMUSCULAR | Status: DC
  Filled 2022-07-12: qty 0.5

## 2022-07-12 MED ORDER — EZETIMIBE 10 MG PO TABS
10.0000 mg | ORAL_TABLET | Freq: Every day | ORAL | Status: DC
  Administered 2022-07-12 – 2022-07-14 (×3): 10 mg via ORAL
  Filled 2022-07-12 (×3): qty 1

## 2022-07-12 MED ORDER — APIXABAN 5 MG PO TABS
5.0000 mg | ORAL_TABLET | Freq: Two times a day (BID) | ORAL | Status: DC
  Administered 2022-07-12 – 2022-07-14 (×4): 5 mg via ORAL
  Filled 2022-07-12 (×4): qty 1

## 2022-07-12 MED ORDER — FUROSEMIDE 10 MG/ML IJ SOLN: 40.0000 mg | Freq: Every day | INTRAMUSCULAR | Status: DC

## 2022-07-12 MED ORDER — PERFLUTREN LIPID MICROSPHERE
1.0000 mL | INTRAVENOUS | Status: AC | PRN
  Administered 2022-07-12: 2 mL via INTRAVENOUS

## 2022-07-12 MED ORDER — ALLOPURINOL 100 MG PO TABS
100.0000 mg | ORAL_TABLET | Freq: Every day | ORAL | Status: DC
  Administered 2022-07-12 – 2022-07-14 (×3): 100 mg via ORAL
  Filled 2022-07-12 (×3): qty 1

## 2022-07-12 NOTE — Progress Notes (Signed)
VASCULAR LAB    Bilateral lower extremity venous duplex has been performed.  See CV proc for preliminary results.   Aroura Vasudevan, RVT 07/12/2022, 12:12 PM

## 2022-07-12 NOTE — Progress Notes (Addendum)
PROGRESS NOTE    Antonio Cervantes  [id number] DOB: 11-10-1955 DOA: 07/11/2022 PCP: Jettie Booze, MD  67/M with history of CAD/CABG and AVR [id number], chronic systolic CHF, ischemic cardiomyopathy with recovery, atrial fibrillation on Eliquis presented to the ED with chest tightness and shortness of breath.  In the ED his creatinine was 1.7, BNP 669, troponin 18, 22, potassium of 6, EKG with sinus rhythm, first-degree AV block and chest x-ray noted atelectasis with small bilateral effusions  Subjective: -Feels better, breathing is improving  Assessment and Plan:  Acute on chronic diastolic CHF -Previously systolic CHF, now recovered EF -Last echo 6/23 with preserved EF, grade 2 diastolic dysfunction -Continue IV Lasix today, hold Farxiga, follow-up repeat echo -Monitor I's/O, daily weights, GDMT limited by CKD -Baseline creatinine around 1.65, now 2.0   Acute kidney injury, hyperkalemia -Baseline creatinine around 1.6, now 2.0  -holding NSAIDs, metformin -Continue IV Lasix 1 more day, decrease diuretic dose, follow-up repeat echo -Check renal ultrasound, monitor urine output   CADs/p CABG -Flat troponin, no ACS -EKG no hyperacute findings -echo in am  -continue statin, metoprolol, asa,zetia    Paroxysmal Atrial fibrillation -Continue metoprolol and Eliquis   DMII -hold Farxiga, metformin on hold    HTN -continue metoprolol      Gout -continue allopurinol     HLD -continue atorvastatin ,zetia    Hypothyroidism -elevated tsh ?  Compliance -resume synthroid  -t3,t4 pending     DVT prophylaxis: on full dose OAC Code Status: full/ as discussed per patient wishes in event of cardiac arrest Family Communication: None present Disposition Plan: Home in 1 to 2 days  Consultants:    Procedures:   Antimicrobials:    Objective: Vitals:   07/12/22 0600 07/12/22 0648 07/12/22 0800 07/12/22 1044  BP: 112/67  (!) 124/93   Pulse: 71  68   Resp: 15  15    Temp:  98.4 F (36.9 C)  98.4 F (36.9 C)  TempSrc:      SpO2: 92%  97%   Weight:      Height:        Intake/Output Summary (Last 24 hours) at 07/12/2022 1312 Last data filed at 07/11/2022 2200 Gross per 24 hour  Intake 45.28 ml  Output 875 ml  Net -829.72 ml   Filed Weights   07/11/22 1542  Weight: 77.1 kg    Examination:  General exam: Elderly pleasant male sitting up in bed, AAOx3, no distress HEENT: Positive JVD CVS: S1-S2, regular rhythm Lungs: Decreased breath sounds at the bases Abdomen: Soft, nontender, bowel sounds present Extremities: Trace edema  Skin: No rashes Psychiatry:  Mood & affect appropriate.     Data Reviewed:   CBC: Recent Labs  Lab 07/11/22 1550 07/12/22 0813  WBC 11.6* 9.1  HGB 11.5* 9.2*  HCT 36.3* 27.5*  MCV 96.0 91.7  PLT 289 [id number]   Basic Metabolic Panel: Recent Labs  Lab 07/11/22 1550 07/11/22 1759 07/11/22 1904 07/11/22 1948 07/12/22 0424 07/12/22 0813  NA 133*  --   --   --   --  136  K 6.0*  --  3.9 3.7 4.7 4.3  CL 104  --   --   --   --  107  CO2 18*  --   --   --   --  19*  GLUCOSE 158* 119*  --   --   --  115*  BUN 34*  --   --   --   --  33*  CREATININE 1.77*  --   --   --   --  2.05*  CALCIUM 9.2  --   --   --   --  8.4*   GFR: Estimated Creatinine Clearance: 34.2 mL/min (A) (by C-G formula based on SCr of 2.05 mg/dL (H)). Liver Function Tests: Recent Labs  Lab 07/11/22 1904 07/12/22 0813  AST 20 15  ALT 12 12  ALKPHOS 71 60  BILITOT 0.6 0.7  PROT 6.5 5.9*  ALBUMIN 3.4* 3.1*   No results for input(s): "LIPASE", "AMYLASE" in the last 168 hours. No results for input(s): "AMMONIA" in the last 168 hours. Coagulation Profile: No results for input(s): "INR", "PROTIME" in the last 168 hours. Cardiac Enzymes: No results for input(s): "CKTOTAL", "CKMB", "CKMBINDEX", "TROPONINI" in the last 168 hours. BNP (last 3 results) No results for input(s): "PROBNP" in the last 8760 hours. HbA1C: No results for  input(s): "HGBA1C" in the last 72 hours. CBG: Recent Labs  Lab 07/11/22 2024 07/11/22 2151 07/12/22 0702 07/12/22 1222  GLUCAP 132* 122* 135* 136*   Lipid Profile: No results for input(s): "CHOL", "HDL", "LDLCALC", "TRIG", "CHOLHDL", "LDLDIRECT" in the last 72 hours. Thyroid Function Tests: Recent Labs    07/11/22 2153 07/12/22 0424  TSH 11.138*  --   FREET4  --  0.93   Anemia Panel: No results for input(s): "VITAMINB12", "FOLATE", "FERRITIN", "TIBC", "IRON", "RETICCTPCT" in the last 72 hours. Urine analysis:    Component Value Date/Time   COLORURINE STRAW (A) 07/28/2021 2100   APPEARANCEUR CLEAR 07/28/2021 2100   LABSPEC 1.010 07/28/2021 2100   PHURINE 5.0 07/28/2021 2100   GLUCOSEU >=500 (A) 07/28/2021 2100   HGBUR NEGATIVE 07/28/2021 2100   BILIRUBINUR NEGATIVE 07/28/2021 2100   [place] NEGATIVE 07/28/2021 2100   PROTEINUR NEGATIVE 07/28/2021 2100   NITRITE NEGATIVE 07/28/2021 2100   LEUKOCYTESUR NEGATIVE 07/28/2021 2100   Sepsis Labs: @LABRCNTIP (procalcitonin:4,lacticidven:4)  ) Recent Results (from the past 240 hour(s))  Respiratory (~20 pathogens) panel by PCR     Status: None   Collection Time: 07/11/22 11:02 PM   Specimen: Nasopharyngeal Swab; Respiratory  Result Value Ref Range Status   Adenovirus NOT DETECTED NOT DETECTED Final   Coronavirus 229E NOT DETECTED NOT DETECTED Final    Comment: (NOTE) The Coronavirus on the Respiratory Panel, DOES NOT test for the novel  Coronavirus (2019 nCoV)    Coronavirus HKU1 NOT DETECTED NOT DETECTED Final   Coronavirus NL63 NOT DETECTED NOT DETECTED Final   Coronavirus OC43 NOT DETECTED NOT DETECTED Final   Metapneumovirus NOT DETECTED NOT DETECTED Final   Rhinovirus / Enterovirus NOT DETECTED NOT DETECTED Final   Influenza A NOT DETECTED NOT DETECTED Final   Influenza B NOT DETECTED NOT DETECTED Final   Parainfluenza Virus 1 NOT DETECTED NOT DETECTED Final   Parainfluenza Virus 2 NOT DETECTED NOT DETECTED  Final   Parainfluenza Virus 3 NOT DETECTED NOT DETECTED Final   Parainfluenza Virus 4 NOT DETECTED NOT DETECTED Final   Respiratory Syncytial Virus NOT DETECTED NOT DETECTED Final   Bordetella pertussis NOT DETECTED NOT DETECTED Final   Bordetella Parapertussis NOT DETECTED NOT DETECTED Final   Chlamydophila pneumoniae NOT DETECTED NOT DETECTED Final   Mycoplasma pneumoniae NOT DETECTED NOT DETECTED Final    Comment: Performed at [facility], [address] [place], [place] [address]     Radiology Studies: VAS [place] LOWER EXTREMITY VENOUS (DVT)  Result Date: 07/12/2022  Lower Venous DVT Study Patient Name:  TARY DIEUDONNE  Date of Exam:   07/12/2022 Medical Rec #: [id number]          Accession #:    [id number] Date of Birth: [birth year]         Patient Gender: M Patient Age:   8 years Exam Location:  [facility] Procedure:      VAS [place] LOWER EXTREMITY VENOUS (DVT) Referring Phys: Myles Rosenthal --------------------------------------------------------------------------------  Indications: Elevated D-Dimer.  Anticoagulation: Eliquis. Comparison Study: Prior negative right LEV done 07/08/16 Performing Technologist: Sharion Dove RVS  Examination Guidelines: A complete evaluation includes B-mode imaging, spectral Doppler, color Doppler, and power Doppler as needed of all accessible portions of each vessel. Bilateral testing is considered an integral part of a complete examination. Limited examinations for reoccurring indications may be performed as noted. The reflux portion of the exam is performed with the patient in reverse Trendelenburg.  +---------+---------------+---------+-----------+----------+--------------+ RIGHT    CompressibilityPhasicitySpontaneityPropertiesThrombus Aging +---------+---------------+---------+-----------+----------+--------------+ CFV      Full           Yes      Yes                                  +---------+---------------+---------+-----------+----------+--------------+ SFJ      Full                                                        +---------+---------------+---------+-----------+----------+--------------+ FV Prox  Full                                                        +---------+---------------+---------+-----------+----------+--------------+ FV Mid   Full           Yes      Yes                                 +---------+---------------+---------+-----------+----------+--------------+ FV DistalFull                                                        +---------+---------------+---------+-----------+----------+--------------+ PFV      Full                                                        +---------+---------------+---------+-----------+----------+--------------+ POP      Full           Yes      Yes                                 +---------+---------------+---------+-----------+----------+--------------+ PTV      Full                                                        +---------+---------------+---------+-----------+----------+--------------+  PERO     Full                                                        +---------+---------------+---------+-----------+----------+--------------+   +---------+---------------+---------+-----------+----------+--------------+ LEFT     CompressibilityPhasicitySpontaneityPropertiesThrombus Aging +---------+---------------+---------+-----------+----------+--------------+ CFV      Full           Yes      Yes                                 +---------+---------------+---------+-----------+----------+--------------+ SFJ      Full                                                        +---------+---------------+---------+-----------+----------+--------------+ FV Prox  Full                                                         +---------+---------------+---------+-----------+----------+--------------+ FV Mid   Full           Yes      Yes                                 +---------+---------------+---------+-----------+----------+--------------+ FV DistalFull                                                        +---------+---------------+---------+-----------+----------+--------------+ PFV      Full                                                        +---------+---------------+---------+-----------+----------+--------------+ POP      Full           Yes      Yes                                 +---------+---------------+---------+-----------+----------+--------------+ PTV      Full                                                        +---------+---------------+---------+-----------+----------+--------------+ PERO     Full                                                        +---------+---------------+---------+-----------+----------+--------------+  Summary: BILATERAL: - No evidence of deep vein thrombosis seen in the lower extremities, bilaterally. -No evidence of popliteal cyst, bilaterally.   *See table(s) above for measurements and observations.    Preliminary    DG Chest 2 View  Result Date: 07/11/2022 CLINICAL DATA:  Chest pain. EXAM: CHEST - 2 VIEW COMPARISON:  08/28/2021 FINDINGS: Patchy atelectasis or infiltrate noted at the lung bases with tiny bilateral pleural effusions evident. No pulmonary edema. The cardiopericardial silhouette is within normal limits for size. The visualized bony structures of the thorax are unremarkable. IMPRESSION: Patchy atelectasis or infiltrate at the lung bases with tiny bilateral pleural effusions. Electronically Signed   By: Misty Stanley M.D.   On: 07/11/2022 16:45     Scheduled Meds:  furosemide  60 mg Intravenous Q12H   [START ON 07/13/2022] influenza vaccine adjuvanted  0.5 mL Intramuscular Tomorrow-1000   insulin aspart  0-6 Units  Subcutaneous TID WC   Continuous Infusions:   LOS: 1 day    Time spent: 63min    Domenic Polite, MD [facility]   07/12/2022, 1:12 PM

## 2022-07-12 NOTE — ED Notes (Signed)
Patient transported to [facility]

## 2022-07-12 NOTE — Plan of Care (Signed)
  Problem: Education: Goal: Ability to demonstrate management of disease process will improve Outcome: Progressing Goal: Ability to verbalize understanding of medication therapies will improve Outcome: Progressing Goal: Individualized Educational Video(s) Outcome: Progressing   Problem: Activity: Goal: Capacity to carry out activities will improve Outcome: Progressing   Problem: Cardiac: Goal: Ability to achieve and maintain adequate cardiopulmonary perfusion will improve Outcome: Progressing   Problem: Education: Goal: Knowledge of General Education information will improve Description: Including pain rating scale, medication(s)/side effects and non-pharmacologic comfort measures Outcome: Progressing   Problem: Health Behavior/Discharge Planning: Goal: Ability to manage health-related needs will improve Outcome: Progressing   Problem: Clinical Measurements: Goal: Ability to maintain clinical measurements within normal limits will improve Outcome: Progressing Goal: Will remain free from infection Outcome: Progressing Goal: Diagnostic test results will improve Outcome: Progressing Goal: Respiratory complications will improve Outcome: Progressing Goal: Cardiovascular complication will be avoided Outcome: Progressing   Problem: Activity: Goal: Risk for activity intolerance will decrease Outcome: Progressing   Problem: Nutrition: Goal: Adequate nutrition will be maintained Outcome: Progressing   Problem: Coping: Goal: Level of anxiety will decrease Outcome: Progressing   Problem: Elimination: Goal: Will not experience complications related to bowel motility Outcome: Progressing Goal: Will not experience complications related to urinary retention Outcome: Progressing   Problem: Pain Managment: Goal: General experience of comfort will improve Outcome: Progressing   Problem: Safety: Goal: Ability to remain free from injury will improve Outcome: Progressing    Problem: Skin Integrity: Goal: Risk for impaired skin integrity will decrease Outcome: Progressing   Problem: Education: Goal: Ability to demonstrate management of disease process will improve Outcome: Progressing Goal: Ability to verbalize understanding of medication therapies will improve Outcome: Progressing Goal: Individualized Educational Video(s) Outcome: Progressing   Problem: Activity: Goal: Capacity to carry out activities will improve Outcome: Progressing   Problem: Cardiac: Goal: Ability to achieve and maintain adequate cardiopulmonary perfusion will improve Outcome: Progressing   Problem: Education: Goal: Knowledge of General Education information will improve Description: Including pain rating scale, medication(s)/side effects and non-pharmacologic comfort measures Outcome: Progressing   Problem: Health Behavior/Discharge Planning: Goal: Ability to manage health-related needs will improve Outcome: Progressing   Problem: Clinical Measurements: Goal: Ability to maintain clinical measurements within normal limits will improve Outcome: Progressing Goal: Will remain free from infection Outcome: Progressing Goal: Diagnostic test results will improve Outcome: Progressing Goal: Respiratory complications will improve Outcome: Progressing Goal: Cardiovascular complication will be avoided Outcome: Progressing   Problem: Activity: Goal: Risk for activity intolerance will decrease Outcome: Progressing   Problem: Nutrition: Goal: Adequate nutrition will be maintained Outcome: Progressing   Problem: Coping: Goal: Level of anxiety will decrease Outcome: Progressing   Problem: Elimination: Goal: Will not experience complications related to bowel motility Outcome: Progressing Goal: Will not experience complications related to urinary retention Outcome: Progressing   Problem: Pain Managment: Goal: General experience of comfort will improve Outcome:  Progressing   Problem: Safety: Goal: Ability to remain free from injury will improve Outcome: Progressing   Problem: Skin Integrity: Goal: Risk for impaired skin integrity will decrease Outcome: Progressing   

## 2022-07-12 NOTE — ED Notes (Signed)
ED TO INPATIENT HANDOFF REPORT  ED Nurse Name and Phone #: Marion Rosenberry [phone]   S Name/Age/Gender Antonio Cervantes 67 y.o. male Room/Bed: 001C/001C  Code Status   Code Status: Full Code  Home/SNF/Other Home Patient oriented to: self, place, time, and situation Is this baseline? Yes   Triage Complete: Triage complete  Chief Complaint CHF (congestive heart failure) ([place]) [I50.9]  Triage Note Pt BIBA from home. Since 0900, pt has had chest tightness, across chest, and SHOB. Pain eased with EMS , but still present. Pt states pain is the same whether sitting or standing.   324 ASA prior to arrival.   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  May admit patient to [facility] or [facility] if equivalent level of care is available:: Yes  Covid Evaluation: Symptomatic Person Under Investigation (PUI) or recent exposure (last 10 days) *Testing Required*  Diagnosis: CH[facility] (congestive heart failure) [id number]  Admitting Physician: Clance Boll [id number]  Attending Physician: Clance Boll [id number]  Certification:: I certify this patient will need inpatient services for at least 2 midnights  Estimated Length of Stay: 3          B Medical/Surgery History Past Medical History:  Diagnosis Date   Diabetes mellitus without complication ([place])    Diabetic peripheral neuropathy associated with type  2 diabetes mellitus ([place])    Past Surgical History:  Procedure Laterality Date   AORTIC VALVE REPLACEMENT N/A 07/29/2021   Procedure: AORTIC VALVE REPLACEMENT (AVR) USING EDWARDS INSPIRIS AORTIC VALVE SIZE 23MM;  Surgeon: Gaye Pollack, MD;  Location: [facility];  Service: Open Heart Surgery;  Laterality: N/A;   CORONARY ARTERY BYPASS GRAFT N/A 07/29/2021   Procedure: CORONARY ARTERY BYPASS GRAFTING (CABG) TIMES FOUR, USING LEFT INTERNAL MAMMARY ARTERY AND RIGHT GREATER SAPHENOUS VEIN HARVESTED ENDOSCOPICALLYY.;  Surgeon: Gaye Pollack, MD;  Location: [facility];  Service: Open Heart Surgery;  Laterality: N/A;   RIGHT/LEFT HEART CATH AND CORONARY ANGIOGRAPHY N/A 05/21/2021   Procedure: RIGHT/LEFT HEART CATH AND CORONARY ANGIOGRAPHY;  Surgeon: Jettie Booze, MD;  Location: [facility];  Service: Cardiovascular;  Laterality: N/A;   TEE WITHOUT CARDIOVERSION N/A 07/29/2021   Procedure: TRANSESOPHAGEAL ECHOCARDIOGRAM (TEE);  Surgeon: Gaye Pollack, MD;  Location: [facility];  Service: Open Heart Surgery;  Laterality: N/A;     A IV Location/Drains/Wounds Patient Lines/Drains/Airways Status     Active Line/Drains/Airways     Name Placement date Placement time Site Days   Peripheral IV 07/11/22 18 G Right Antecubital 07/11/22  1804  Antecubital  1            Intake/Output Last 24 hours  Intake/Output Summary (Last 24 hours) at 07/12/2022 1223 Last data filed at 07/11/2022 2200 Gross per 24 hour  Intake 45.28 ml  Output 875 ml  Net -829.72 ml    Labs/Imaging Results for orders placed or performed during the hospital encounter of 07/11/22 (from the past 48 hour(s))  Basic metabolic panel  Status: Abnormal   Collection Time: 07/11/22  3:50 PM  Result Value Ref Range   Sodium 133 (L) 135 - 145 mmol/L   Potassium 6.0 (H) 3.5 - 5.1 mmol/L   Chloride 104 98 - 111 mmol/L   CO2 18 (L) 22 - 32 mmol/L   Glucose, Bld 158 (H) 70 - 99 mg/dL    Comment: Glucose reference range applies only  to samples taken after fasting for at least 8 hours.   BUN 34 (H) 8 - 23 mg/dL   Creatinine, Ser 1.77 (H) 0.61 - 1.24 mg/dL   Calcium 9.2 8.9 - 10.3 mg/dL   GFR, Estimated 42 (L) >60 mL/min    Comment: (NOTE) Calculated using the CKD-EPI Creatinine Equation (2021)    Anion gap 11 5 - 15    Comment: Performed at [facility] [address], [place], [place] [address]  CBC     Status: Abnormal   Collection Time: 07/11/22  3:50 PM  Result Value Ref Range   WBC 11.6 (H) 4.0 - 10.5 K/uL   RBC 3.78 (L) 4.22 - 5.81 MIL/uL   Hemoglobin 11.5 (L) 13.0 - 17.0 g/dL   HCT 36.3 (L) 39.0 - 52.0 %   MCV 96.0 80.0 - 100.0 fL   MCH 30.4 26.0 - 34.0 pg   MCHC 31.7 30.0 - 36.0 g/dL   RDW 15.0 11.5 - 15.5 %   Platelets 289 150 - 400 K/uL   nRBC 0.0 0.0 - 0.2 %    Comment: Performed at [facility], [place] [address], [place], [place] [address]  Troponin I (High Sensitivity)     Status: Abnormal   Collection Time: 07/11/22  3:50 PM  Result Value Ref Range   Troponin I (High Sensitivity) 22 (H) <18 ng/L    Comment: (NOTE) Elevated high sensitivity troponin I (hsTnI) values and significant  changes across serial measurements may suggest ACS but many other  chronic and acute conditions are known to elevate hsTnI results.  Refer to the "Links" section for chest pain algorithms and additional  guidance. Performed at [facility], [place] [address] [place], [place] [address]   Troponin I (High Sensitivity)     Status: Abnormal   Collection Time: 07/11/22  5:59 PM  Result Value Ref Range   Troponin I (High Sensitivity) 18 (H) <18 ng/L    Comment: (NOTE) Elevated high sensitivity troponin I (hsTnI) values and significant  changes across serial measurements may suggest ACS but many other  chronic and acute conditions are known to elevate hsTnI results.  Refer to the "Links" section for chest pain algorithms and additional  guidance. Performed at [facility], [place] [address]  [place], [place] [address]   Glucose, random     Status: Abnormal   Collection Time: 07/11/22  5:59 PM  Result Value Ref Range   Glucose, Bld 119 (H) 70 - 99 mg/dL    Comment: Glucose reference range applies only to samples taken after fasting for at least 8 hours. Performed at [facility], [place] [address] [place], [place] [address]   Brain natriuretic peptide     Status: Abnormal   Collection Time: 07/11/22  6:17 PM  Result Value Ref Range   B Natriuretic Peptide 669.5 (H) 0.0 - 100.0 pg/mL    Comment: Performed at [facility] [address] [place], [place] [address]  Hepatic function panel     Status: Abnormal  Collection Time: 07/11/22  7:04 PM  Result Value Ref Range   Total Protein 6.5 6.5 - 8.1 g/dL   Albumin 3.4 (L) 3.5 - 5.0 g/dL   AST 20 15 - 41 U/L   ALT 12 0 - 44 U/L   Alkaline Phosphatase 71 38 - 126 U/L   Total Bilirubin 0.6 0.3 - 1.2 mg/dL   Bilirubin, Direct <0.1 0.0 - 0.2 mg/dL   Indirect Bilirubin NOT CALCULATED 0.3 - 0.9 mg/dL    Comment: Performed at [facility] [address] [place], [place] [address]  Potassium     Status: None   Collection Time: 07/11/22  7:04 PM  Result Value Ref Range   Potassium 3.9 3.5 - 5.1 mmol/L    Comment: DELTA CHECK NOTED Performed at [facility], [place] [address] [place], [place] [address]   Potassium     Status: None   Collection Time: 07/11/22  7:48 PM  Result Value Ref Range   Potassium 3.7 3.5 - 5.1 mmol/L    Comment: Performed at [facility], [place] [address], [place], [place] [address]  CBG monitoring, ED     Status: Abnormal   Collection Time: 07/11/22  8:24 PM  Result Value Ref Range   Glucose-Capillary 132 (H) 70 - 99 mg/dL    Comment: Glucose reference range applies only to samples taken after fasting for at least 8 hours.  HIV Antibody (routine testing w rflx)     Status: None   Collection Time: 07/11/22  8:56 PM  Result Value Ref Range   HIV Screen 4th Generation wRfx Non  Reactive Non Reactive    Comment: Performed at [facility], [place] [address] [place], [place] [address]  D-dimer, quantitative     Status: Abnormal   Collection Time: 07/11/22  8:56 PM  Result Value Ref Range   D-Dimer, Quant 1.15 (H) 0.00 - 0.50 ug/mL-FEU    Comment: (NOTE) At the manufacturer cut-off value of 0.5 g/mL FEU, this assay has a negative predictive value of 95-100%.This assay is intended for use in conjunction with a clinical pretest probability (PTP) assessment model to exclude pulmonary embolism (PE) and deep venous thrombosis (DVT) in outpatients suspected of PE or DVT. Results should be correlated with clinical presentation. Performed at [facility], [place] [address] [place], [place] [address]   CBG monitoring, ED     Status: Abnormal   Collection Time: 07/11/22  9:51 PM  Result Value Ref Range   Glucose-Capillary 122 (H) 70 - 99 mg/dL    Comment: Glucose reference range applies only to samples taken after fasting for at least 8 hours.  TSH     Status: Abnormal   Collection Time: 07/11/22  9:53 PM  Result Value Ref Range   TSH 11.138 (H) 0.350 - 4.500 uIU/mL    Comment: Performed by a 3rd Generation assay with a functional sensitivity of <=0.01 uIU/mL. Performed at [facility], [place] [address], [place],  [address]   Respiratory (~20 pathogens) panel by PCR     Status: None   Collection Time: 07/11/22 11:02 PM   Specimen: Nasopharyngeal Swab; Respiratory  Result Value Ref Range   Adenovirus NOT DETECTED NOT DETECTED   Coronavirus 229E NOT DETECTED NOT DETECTED    Comment: (NOTE) The Coronavirus on the Respiratory Panel, DOES NOT test for the novel  Coronavirus (2019 nCoV)    Coronavirus HKU1 NOT DETECTED NOT DETECTED   Coronavirus  NL63 NOT DETECTED NOT DETECTED   Coronavirus OC43 NOT DETECTED NOT DETECTED   Metapneumovirus NOT DETECTED NOT DETECTED   Rhinovirus / Enterovirus NOT DETECTED NOT DETECTED   Influenza A NOT DETECTED NOT  DETECTED   Influenza B NOT DETECTED NOT DETECTED   Parainfluenza Virus 1 NOT DETECTED NOT DETECTED   Parainfluenza Virus 2 NOT DETECTED NOT DETECTED   Parainfluenza Virus 3 NOT DETECTED NOT DETECTED   Parainfluenza Virus 4 NOT DETECTED NOT DETECTED   Respiratory Syncytial Virus NOT DETECTED NOT DETECTED   Bordetella pertussis NOT DETECTED NOT DETECTED   Bordetella Parapertussis NOT DETECTED NOT DETECTED   Chlamydophila pneumoniae NOT DETECTED NOT DETECTED   Mycoplasma pneumoniae NOT DETECTED NOT DETECTED    Comment: Performed at [facility], [place] [address] [place], [place] [address]  Potassium     Status: None   Collection Time: 07/12/22  4:24 AM  Result Value Ref Range   Potassium 4.7 3.5 - 5.1 mmol/L    Comment: Performed at [facility] [address] [place], [place] [address]  T4, free     Status: None   Collection Time: 07/12/22  4:24 AM  Result Value Ref Range   Free T4 0.93 0.61 - 1.12 ng/dL    Comment: (NOTE) Biotin ingestion may interfere with free T4 tests. If the results are inconsistent with the TSH level, previous test results, or the clinical presentation, then consider biotin interference. If needed, order repeat testing after stopping biotin. Performed at [facility], [place] [address], [place], [place] [address]   CBG monitoring, ED     Status: Abnormal   Collection Time: 07/12/22  7:02 AM  Result Value Ref Range   Glucose-Capillary 135 (H) 70 - 99 mg/dL    Comment: Glucose reference range applies only to samples taken after fasting for at least 8 hours.  Comprehensive metabolic panel     Status: Abnormal   Collection Time: 07/12/22  8:13 AM  Result Value Ref Range   Sodium 136 135 - 145 mmol/L   Potassium 4.3 3.5 - 5.1 mmol/L   Chloride 107 98 - 111 mmol/L   CO2 19 (L) 22 - 32 mmol/L   Glucose, Bld 115 (H) 70 - 99 mg/dL    Comment: Glucose reference range applies only to samples taken after fasting for at least 8 hours.   BUN 33 (H) 8  - 23 mg/dL   Creatinine, Ser 2.05 (H) 0.61 - 1.24 mg/dL   Calcium 8.4 (L) 8.9 - 10.3 mg/dL   Total Protein 5.9 (L) 6.5 - 8.1 g/dL   Albumin 3.1 (L) 3.5 - 5.0 g/dL   AST 15 15 - 41 U/L   ALT 12 0 - 44 U/L   Alkaline Phosphatase 60 38 - 126 U/L   Total Bilirubin 0.7 0.3 - 1.2 mg/dL   GFR, Estimated 35 (L) >60 mL/min    Comment: (NOTE) Calculated using the CKD-EPI Creatinine Equation (2021)    Anion gap 10 5 - 15    Comment: Performed at [facility], [place] [address] [place] [address]  CBC     Status: Abnormal   Collection Time: 07/12/22  8:13 AM  Result Value Ref Range   WBC 9.1 4.0 - 10.5 K/uL   RBC 3.00 (L) 4.22 - 5.81 MIL/uL   Hemoglobin 9.2 (L) 13.0 - 17.0 g/dL   HCT 27.5 (L) 39.0 - 52.0 %   MCV 91.7 80.0 - 100.0 fL  MCH 30.7 26.0 - 34.0 pg   MCHC 33.5 30.0 - 36.0 g/dL   RDW 15.1 11.5 - 15.5 %   Platelets 192 150 - 400 K/uL   nRBC 0.0 0.0 - 0.2 %    Comment: Performed at [facility], [place] [address] [place], [place] [address]  Procalcitonin     Status: None   Collection Time: 07/12/22  8:13 AM  Result Value Ref Range   Procalcitonin <0.10 ng/mL    Comment:        Interpretation: PCT (Procalcitonin) <= 0.5 ng/mL: Systemic infection (sepsis) is not likely. Local bacterial infection is possible. (NOTE)       Sepsis PCT Algorithm           Lower Respiratory Tract                                      Infection PCT Algorithm    ----------------------------     ----------------------------         PCT < 0.25 ng/mL                PCT < 0.10 ng/mL          Strongly encourage             Strongly discourage   discontinuation of antibiotics    initiation of antibiotics    ----------------------------     -----------------------------       PCT 0.25 - 0.50 ng/mL            PCT 0.10 - 0.25 ng/mL               OR       >80% decrease in PCT            Discourage initiation of                                            antibiotics      Encourage  discontinuation           of antibiotics    ----------------------------     -----------------------------         PCT >= 0.50 ng/mL              PCT 0.26 - 0.50 ng/mL               AND        <80% decrease in PCT             Encourage initiation of                                             antibiotics       Encourage continuation           of antibiotics    ----------------------------     -----------------------------        PCT >= 0.50 ng/mL                  PCT > 0.50 ng/mL               AND         increase in PCT  Strongly encourage                                      initiation of antibiotics    Strongly encourage escalation           of antibiotics                                     -----------------------------                                           PCT <= 0.25 ng/mL                                                 OR                                        > 80% decrease in PCT                                      Discontinue / Do not initiate                                             antibiotics  Performed at [facility], [place] [address], [place], [place] [address]    VAS [place] LOWER EXTREMITY VENOUS (DVT)  Result Date: 07/12/2022  Lower Venous DVT Study Patient Name:  MIKOL HADLOCK  Date of Exam:   07/12/2022 Medical Rec #: [id number]          Accession #:    [id number] Date of Birth: [birth year]         Patient Gender: M Patient Age:   69 years Exam Location:  [facility] Procedure:      VAS [place] LOWER EXTREMITY VENOUS (DVT) Referring Phys: Myles Rosenthal --------------------------------------------------------------------------------  Indications: Elevated D-Dimer.  Anticoagulation: Eliquis. Comparison Study: Prior negative right LEV done 07/08/16 Performing Technologist: Sharion Dove RVS  Examination Guidelines: A complete evaluation includes B-mode imaging, spectral Doppler, color Doppler, and power Doppler as needed of all accessible  portions of each vessel. Bilateral testing is considered an integral part of a complete examination. Limited examinations for reoccurring indications may be performed as noted. The reflux portion of the exam is performed with the patient in reverse Trendelenburg.  +---------+---------------+---------+-----------+----------+--------------+ RIGHT    CompressibilityPhasicitySpontaneityPropertiesThrombus Aging +---------+---------------+---------+-----------+----------+--------------+ CFV      Full           Yes      Yes                                 +---------+---------------+---------+-----------+----------+--------------+ SFJ      Full                                                        +---------+---------------+---------+-----------+----------+--------------+  FV Prox  Full                                                        +---------+---------------+---------+-----------+----------+--------------+ FV Mid   Full           Yes      Yes                                 +---------+---------------+---------+-----------+----------+--------------+ FV DistalFull                                                        +---------+---------------+---------+-----------+----------+--------------+ PFV      Full                                                        +---------+---------------+---------+-----------+----------+--------------+ POP      Full           Yes      Yes                                 +---------+---------------+---------+-----------+----------+--------------+ PTV      Full                                                        +---------+---------------+---------+-----------+----------+--------------+ PERO     Full                                                        +---------+---------------+---------+-----------+----------+--------------+   +---------+---------------+---------+-----------+----------+--------------+ LEFT      CompressibilityPhasicitySpontaneityPropertiesThrombus Aging +---------+---------------+---------+-----------+----------+--------------+ CFV      Full           Yes      Yes                                 +---------+---------------+---------+-----------+----------+--------------+ SFJ      Full                                                        +---------+---------------+---------+-----------+----------+--------------+ FV Prox  Full                                                        +---------+---------------+---------+-----------+----------+--------------+  FV Mid   Full           Yes      Yes                                 +---------+---------------+---------+-----------+----------+--------------+ FV DistalFull                                                        +---------+---------------+---------+-----------+----------+--------------+ PFV      Full                                                        +---------+---------------+---------+-----------+----------+--------------+ POP      Full           Yes      Yes                                 +---------+---------------+---------+-----------+----------+--------------+ PTV      Full                                                        +---------+---------------+---------+-----------+----------+--------------+ PERO     Full                                                        +---------+---------------+---------+-----------+----------+--------------+     Summary: BILATERAL: - No evidence of deep vein thrombosis seen in the lower extremities, bilaterally. -No evidence of popliteal cyst, bilaterally.   *See table(s) above for measurements and observations.    Preliminary    DG Chest 2 View  Result Date: 07/11/2022 CLINICAL DATA:  Chest pain. EXAM: CHEST - 2 VIEW COMPARISON:  08/28/2021 FINDINGS: Patchy atelectasis or infiltrate noted at the lung bases with tiny bilateral pleural  effusions evident. No pulmonary edema. The cardiopericardial silhouette is within normal limits for size. The visualized bony structures of the thorax are unremarkable. IMPRESSION: Patchy atelectasis or infiltrate at the lung bases with tiny bilateral pleural effusions. Electronically Signed   By: Misty Stanley M.D.   On: 07/11/2022 16:45    Pending Labs Unresulted Labs (From admission, onward)     Start     Ordered   07/12/22 0645  T3  Once,   R        07/12/22 0644   07/11/22 2057  Hemoglobin A1c  (Glycemic Control (SSI)  Q 4 Hours / Glycemic Control (SSI)  AC +/- HS)  Once,   R       Comments: To assess prior glycemic control    07/11/22 2058            Vitals/Pain Today's Vitals   07/12/22 0600 07/12/22 0648 07/12/22 0800 07/12/22 1044  BP: 112/67  Marland Kitchen)  124/93   Pulse: 71  68   Resp: 15  15   Temp:  98.4 F (36.9 C)  98.4 F (36.9 C)  TempSrc:      SpO2: 92%  97%   Weight:      Height:      PainSc:        Isolation Precautions Droplet precaution  Medications Medications  insulin aspart (novoLOG) injection 0-6 Units ( Subcutaneous Not Given 07/12/22 0702)  acetaminophen (TYLENOL) tablet 650 mg (has no administration in time range)    Or  acetaminophen (TYLENOL) suppository 650 mg (has no administration in time range)  ondansetron (ZOFRAN) tablet 4 mg (has no administration in time range)    Or  ondansetron (ZOFRAN) injection 4 mg (has no administration in time range)  albuterol (PROVENTIL) (2.5 MG/3ML) 0.083% nebulizer solution 2.5 mg (has no administration in time range)  furosemide (LASIX) injection 60 mg (60 mg Intravenous Given 07/12/22 0703)  influenza vaccine adjuvanted (FLUAD) injection 0.5 mL (has no administration in time range)  perflutren lipid microspheres (DEFINITY) IV suspension (2 mLs Intravenous Contrast Given 07/12/22 1135)  calcium gluconate 1 g/ 50 mL sodium chloride IVPB (0 mg Intravenous Stopped 07/11/22 1958)  furosemide (LASIX) injection 60 mg  (60 mg Intravenous Given 07/11/22 1814)  sodium zirconium cyclosilicate (LOKELMA) packet 10 g (10 g Oral Given 07/11/22 1812)  albuterol (PROVENTIL) (2.5 MG/3ML) 0.083% nebulizer solution 10 mg (10 mg Nebulization Given 07/11/22 1820)  insulin aspart (novoLOG) injection 5 Units (5 Units Intravenous Given 07/11/22 1808)  dextrose 50 % solution 50 mL (50 mLs Intravenous Given 07/11/22 1810)    Mobility walks        R Recommendations: See Admitting Provider Note  Report given to:   Additional Notes: A&Ox4, originally having CP, SOB, hyper K, no longer having any of it. On RA, getting IV lasix, just got back from VAS, crt elevated from baseline

## 2022-07-13 DIAGNOSIS — I5033 Acute on chronic diastolic (congestive) heart failure: Secondary | ICD-10-CM | POA: Diagnosis not present

## 2022-07-13 LAB — CBC
HCT: 30.3 % — ABNORMAL LOW (ref 39.0–52.0)
Hemoglobin: 10.3 g/dL — ABNORMAL LOW (ref 13.0–17.0)
MCH: 30.7 pg (ref 26.0–34.0)
MCHC: 34 g/dL (ref 30.0–36.0)
MCV: 90.4 fL (ref 80.0–100.0)
Platelets: 198 10*3/uL (ref 150–400)
RBC: 3.35 MIL/uL — ABNORMAL LOW (ref 4.22–5.81)
RDW: 15 % (ref 11.5–15.5)
WBC: 11.4 10*3/uL — ABNORMAL HIGH (ref 4.0–10.5)
nRBC: 0 % (ref 0.0–0.2)

## 2022-07-13 LAB — GLUCOSE, CAPILLARY
Glucose-Capillary: 134 mg/dL — ABNORMAL HIGH (ref 70–99)
Glucose-Capillary: 169 mg/dL — ABNORMAL HIGH (ref 70–99)
Glucose-Capillary: 200 mg/dL — ABNORMAL HIGH (ref 70–99)

## 2022-07-13 LAB — BASIC METABOLIC PANEL
Anion gap: 11 (ref 5–15)
BUN: 37 mg/dL — ABNORMAL HIGH (ref 8–23)
CO2: 20 mmol/L — ABNORMAL LOW (ref 22–32)
Calcium: 8.8 mg/dL — ABNORMAL LOW (ref 8.9–10.3)
Chloride: 102 mmol/L (ref 98–111)
Creatinine, Ser: 2.39 mg/dL — ABNORMAL HIGH (ref 0.61–1.24)
GFR, Estimated: 29 mL/min — ABNORMAL LOW (ref >60)
Glucose, Bld: 163 mg/dL — ABNORMAL HIGH (ref 70–99)
Potassium: 4 mmol/L (ref 3.5–5.1)
Sodium: 133 mmol/L — ABNORMAL LOW (ref 135–145)

## 2022-07-13 LAB — T3: T3, Total: 73 ng/dL (ref 71–180)

## 2022-07-13 NOTE — Plan of Care (Signed)
  Problem: Education: Goal: Ability to demonstrate management of disease process will improve Outcome: Progressing Goal: Individualized Educational Video(s) Outcome: Progressing   Problem: Activity: Goal: Capacity to carry out activities will improve Outcome: Progressing   

## 2022-07-13 NOTE — Plan of Care (Signed)

## 2022-07-13 NOTE — Progress Notes (Signed)
PROGRESS NOTE    Antonio Cervantes  [id number] DOB: September 28, 1955 DOA: 07/11/2022 PCP: Jettie Booze, MD  66/M with history of CAD/CABG and AVR [id number], chronic systolic CHF, ischemic cardiomyopathy with recovery, atrial fibrillation on Eliquis presented to the ED with chest tightness and shortness of breath.  In the ED his creatinine was 1.7, BNP 669, troponin 18, 22, potassium of 6, EKG with sinus rhythm, first-degree AV block and chest x-ray noted atelectasis with small bilateral effusions. -Admitted, started on diuretics, improved significantly, complicated by AKI  Subjective: -Feels much better, breathing has improved to baseline  Assessment and Plan:  Acute on chronic diastolic CHF -Previously systolic CHF, now recovered EF -Last echo 6/23 with preserved EF, grade 2 diastolic dysfunction -Repeat echo with preserved EF, indeterminate diastolic dysfunction and mildly reduced RV -Volume status has improved considerably appears euvolemic, weight down 7lb, unfortunately creatinine has trended up further to 2.4 today, hold further diuretics -GDMT limited by CKD, Farxiga on hold   Acute kidney injury, hyperkalemia -Baseline creatinine around 1.6, now 2.4 -recently taking NSAIDs daily for gout -holding NSAIDs, metformin -Renal stone without hydronephrosis, holding diuretics today, monitor urine output, BMP in a.m.   CADs/p CABG -Flat troponin, no ACS -EKG no hyperacute findings -Echo with preserved EF, no wall motion abnormality -continue statin, metoprolol, asa,zetia    Paroxysmal Atrial fibrillation -Continue metoprolol and Eliquis   DMII -hold Farxiga, metformin on hold    HTN -continue metoprolol      Gout -continue allopurinol     HLD -continue atorvastatin ,zetia    Hypothyroidism -elevated tsh ?  Compliance -resume synthroid  -t3,t4 pending     DVT prophylaxis: on full dose [place] Code Status: Full code Family Communication: None present Disposition  Plan: Home in 1 to 2 days  Consultants:    Procedures:   Antimicrobials:    Objective: Vitals:   07/13/22 0017 07/13/22 0530 07/13/22 0738 07/13/22 0941  BP: (!) 108/58 (!) 100/59 117/67 116/67  Pulse: 68 63 64 65  Resp: 16 16 16 16   Temp: 98.6 F (37 C) 98.4 F (36.9 C) 98.2 F (36.8 C)   TempSrc: Oral Oral Oral   SpO2: 92% 96% 97% 94%  Weight:  74.3 kg    Height:        Intake/Output Summary (Last 24 hours) at 07/13/2022 1110 Last data filed at 07/13/2022 [id number] Gross per 24 hour  Intake 660 ml  Output 650 ml  Net 10 ml   Filed Weights   07/11/22 1542 07/12/22 1436 07/13/22 0530  Weight: 77.1 kg 74.5 kg 74.3 kg    Examination:  General exam: Pleasant male sitting up in bed, AAOx3, no distress HEENT: no JVD CVS: S1-S2, regular rhythm Lungs: Clear bilaterally Abdomen: Soft, nontender, bowel sounds present Extremities: No edema  Skin: No rashes Psychiatry:  Mood & affect appropriate.     Data Reviewed:   CBC: Recent Labs  Lab 07/11/22 1550 07/12/22 0813 07/13/22 0106  WBC 11.6* 9.1 11.4*  HGB 11.5* 9.2* 10.3*  HCT 36.3* 27.5* 30.3*  MCV 96.0 91.7 90.4  PLT 289 192 [id number]   Basic Metabolic Panel: Recent Labs  Lab 07/11/22 1550 07/11/22 1759 07/11/22 1904 07/11/22 1948 07/12/22 0424 07/12/22 0813 07/13/22 0106  NA 133*  --   --   --   --  136 133*  K 6.0*  --  3.9 3.7 4.7 4.3 4.0  CL 104  --   --   --   --  107  102  CO2 18*  --   --   --   --  19* 20*  GLUCOSE 158* 119*  --   --   --  115* 163*  BUN 34*  --   --   --   --  33* 37*  CREATININE 1.77*  --   --   --   --  2.05* 2.39*  CALCIUM 9.2  --   --   --   --  8.4* 8.8*   GFR: Estimated Creatinine Clearance: 27.1 mL/min (A) (by C-G formula based on SCr of 2.39 mg/dL (H)). Liver Function Tests: Recent Labs  Lab 07/11/22 1904 07/12/22 0813  AST 20 15  ALT 12 12  ALKPHOS 71 60  BILITOT 0.6 0.7  PROT 6.5 5.9*  ALBUMIN 3.4* 3.1*   No results for input(s): "LIPASE", "AMYLASE" in the  last 168 hours. No results for input(s): "AMMONIA" in the last 168 hours. Coagulation Profile: No results for input(s): "INR", "PROTIME" in the last 168 hours. Cardiac Enzymes: No results for input(s): "CKTOTAL", "CKMB", "CKMBINDEX", "TROPONINI" in the last 168 hours. BNP (last 3 results) No results for input(s): "PROBNP" in the last 8760 hours. HbA1C: No results for input(s): "HGBA1C" in the last 72 hours. CBG: Recent Labs  Lab 07/12/22 0702 07/12/22 1222 07/12/22 1750 07/12/22 2054 07/13/22 0603  GLUCAP 135* 136* 138* 154* 134*   Lipid Profile: No results for input(s): "CHOL", "HDL", "LDLCALC", "TRIG", "CHOLHDL", "LDLDIRECT" in the last 72 hours. Thyroid Function Tests: Recent Labs    07/11/22 2153 07/12/22 0424  TSH 11.138*  --   FREET4  --  0.93   Anemia Panel: No results for input(s): "VITAMINB12", "FOLATE", "FERRITIN", "TIBC", "IRON", "RETICCTPCT" in the last 72 hours. Urine analysis:    Component Value Date/Time   COLORURINE STRAW (A) 07/28/2021 2100   APPEARANCEUR CLEAR 07/28/2021 2100   LABSPEC 1.010 07/28/2021 2100   PHURINE 5.0 07/28/2021 2100   GLUCOSEU >=500 (A) 07/28/2021 2100   HGBUR NEGATIVE 07/28/2021 2100   BILIRUBINUR NEGATIVE 07/28/2021 2100   [place] NEGATIVE 07/28/2021 2100   PROTEINUR NEGATIVE 07/28/2021 2100   NITRITE NEGATIVE 07/28/2021 2100   LEUKOCYTESUR NEGATIVE 07/28/2021 2100   Sepsis Labs: @LABRCNTIP (procalcitonin:4,lacticidven:4)  ) Recent Results (from the past 240 hour(s))  Respiratory (~20 pathogens) panel by PCR     Status: None   Collection Time: 07/11/22 11:02 PM   Specimen: Nasopharyngeal Swab; Respiratory  Result Value Ref Range Status   Adenovirus NOT DETECTED NOT DETECTED Final   Coronavirus 229E NOT DETECTED NOT DETECTED Final    Comment: (NOTE) The Coronavirus on the Respiratory Panel, DOES NOT test for the novel  Coronavirus (2019 nCoV)    Coronavirus HKU1 NOT DETECTED NOT DETECTED Final   Coronavirus NL63  NOT DETECTED NOT DETECTED Final   Coronavirus OC43 NOT DETECTED NOT DETECTED Final   Metapneumovirus NOT DETECTED NOT DETECTED Final   Rhinovirus / Enterovirus NOT DETECTED NOT DETECTED Final   Influenza A NOT DETECTED NOT DETECTED Final   Influenza B NOT DETECTED NOT DETECTED Final   Parainfluenza Virus 1 NOT DETECTED NOT DETECTED Final   Parainfluenza Virus 2 NOT DETECTED NOT DETECTED Final   Parainfluenza Virus 3 NOT DETECTED NOT DETECTED Final   Parainfluenza Virus 4 NOT DETECTED NOT DETECTED Final   Respiratory Syncytial Virus NOT DETECTED NOT DETECTED Final   Bordetella pertussis NOT DETECTED NOT DETECTED Final   Bordetella Parapertussis NOT DETECTED NOT DETECTED Final   Chlamydophila pneumoniae NOT DETECTED NOT DETECTED  Final   Mycoplasma pneumoniae NOT DETECTED NOT DETECTED Final    Comment: Performed at [facility], [place] [address], [place], [place] [address]     Radiology Studies: VAS [place] LOWER EXTREMITY VENOUS (DVT)  Result Date: 07/13/2022  Lower Venous DVT Study Patient Name:  Antonio Cervantes  Date of Exam:   07/12/2022 Medical Rec #: [id number]          Accession #:    [id number] Date of Birth: November 16, 1955         Patient Gender: M Patient Age:   89 years Exam Location:  [facility] Procedure:      VAS [place] LOWER EXTREMITY VENOUS (DVT) Referring Phys: Myles Rosenthal --------------------------------------------------------------------------------  Indications: Elevated D-Dimer.  Anticoagulation: Eliquis. Comparison Study: Prior negative right LEV done 07/08/16 Performing Technologist: Sharion Dove RVS  Examination Guidelines: A complete evaluation includes B-mode imaging, spectral Doppler, color Doppler, and power Doppler as needed of all accessible portions of each vessel. Bilateral testing is considered an integral part of a complete examination. Limited examinations for reoccurring indications may be performed as noted. The reflux portion of the exam is performed  with the patient in reverse Trendelenburg.  +---------+---------------+---------+-----------+----------+--------------+ RIGHT    CompressibilityPhasicitySpontaneityPropertiesThrombus Aging +---------+---------------+---------+-----------+----------+--------------+ CFV      Full           Yes      Yes                                 +---------+---------------+---------+-----------+----------+--------------+ SFJ      Full                                                        +---------+---------------+---------+-----------+----------+--------------+ FV Prox  Full                                                        +---------+---------------+---------+-----------+----------+--------------+ FV Mid   Full           Yes      Yes                                 +---------+---------------+---------+-----------+----------+--------------+ FV DistalFull                                                        +---------+---------------+---------+-----------+----------+--------------+ PFV      Full                                                        +---------+---------------+---------+-----------+----------+--------------+ POP      Full           Yes      Yes                                 +---------+---------------+---------+-----------+----------+--------------+  PTV      Full                                                        +---------+---------------+---------+-----------+----------+--------------+ PERO     Full                                                        +---------+---------------+---------+-----------+----------+--------------+   +---------+---------------+---------+-----------+----------+--------------+ LEFT     CompressibilityPhasicitySpontaneityPropertiesThrombus Aging +---------+---------------+---------+-----------+----------+--------------+ CFV      Full           Yes      Yes                                  +---------+---------------+---------+-----------+----------+--------------+ SFJ      Full                                                        +---------+---------------+---------+-----------+----------+--------------+ FV Prox  Full                                                        +---------+---------------+---------+-----------+----------+--------------+ FV Mid   Full           Yes      Yes                                 +---------+---------------+---------+-----------+----------+--------------+ FV DistalFull                                                        +---------+---------------+---------+-----------+----------+--------------+ PFV      Full                                                        +---------+---------------+---------+-----------+----------+--------------+ POP      Full           Yes      Yes                                 +---------+---------------+---------+-----------+----------+--------------+ PTV      Full                                                        +---------+---------------+---------+-----------+----------+--------------+  PERO     Full                                                        +---------+---------------+---------+-----------+----------+--------------+     Summary: BILATERAL: - No evidence of deep vein thrombosis seen in the lower extremities, bilaterally. -No evidence of popliteal cyst, bilaterally.   *See table(s) above for measurements and observations. Electronically signed by Servando Snare MD on 07/13/2022 at 10:36:35 AM.    Final    ECHOCARDIOGRAM COMPLETE  Result Date: 07/12/2022    ECHOCARDIOGRAM REPORT   Patient Name:   Antonio Cervantes Date of Exam: 07/12/2022 Medical Rec #:  [id number]         Height:       66.0 in Accession #:    [id number]        Weight:       170.0 lb Date of Birth:  08-15-1955        BSA:          1.866 m Patient Age:    62 years          BP:           124/93 mmHg  Patient Gender: M                 HR:           68 bpm. Exam Location:  Inpatient Procedure: 2D Echo, Cardiac Doppler, Color Doppler and Intracardiac            Opacification Agent Indications:    CHF I50.31  History:        Patient has prior history of Echocardiogram examinations, most                 recent 10/18/2021. Cardiomyopathy, Previous Myocardial Infarction                 and CAD, Prior CABG, Aortic Valve Disease; Risk                 Factors:Dyslipidemia, Former Smoker and Diabetes.                 Aortic Valve: 23 mm Edwards INSPIRIS RESILIA pericardial valve                 valve is present in the aortic position. Procedure Date: 07/29/21.  Sonographer:    Wilkie Aye RVT RCS Referring Phys: [id number] [facility] A THOMAS  Sonographer Comments: Suboptimal apical window and suboptimal parasternal window. IMPRESSIONS  1. Left ventricular ejection fraction, by estimation, is 55 to 60%. The left ventricle has normal function. The left ventricle has no regional wall motion abnormalities. Left ventricular diastolic parameters are indeterminate.  2. Right ventricular systolic function is mildly reduced. The right ventricular size is normal. Tricuspid regurgitation signal is inadequate for assessing PA pressure.  3. The mitral valve is normal in structure. Mild mitral valve regurgitation. No evidence of mitral stenosis.  4. The aortic valve has been repaired/replaced. Aortic valve regurgitation is not visualized. There is a 23 mm Edwards INSPIRIS RESILIA pericardial valve valve present in the aortic position. Procedure Date: 07/29/21. Echo findings are consistent with normal structure and function of the aortic valve prosthesis. Aortic valve area, by VTI measures 2.02 cm. Aortic valve mean gradient measures 6.0 mmHg. Aortic valve  Vmax measures 1.76 m/s.  5. The inferior vena cava is normal in size with greater than 50% respiratory variability, suggesting right atrial pressure of 3 mmHg. FINDINGS  Left Ventricle: Left  ventricular ejection fraction, by estimation, is 55 to 60%. The left ventricle has normal function. The left ventricle has no regional wall motion abnormalities. The left ventricular internal cavity size was normal in size. There is  no left ventricular hypertrophy. Left ventricular diastolic parameters are indeterminate. Right Ventricle: The right ventricular size is normal. No increase in right ventricular wall thickness. Right ventricular systolic function is mildly reduced. Tricuspid regurgitation signal is inadequate for assessing PA pressure. The tricuspid regurgitant velocity is 1.03 m/s, and with an assumed right atrial pressure of 3 mmHg, the estimated right ventricular systolic pressure is 7.2 mmHg. Left Atrium: Left atrial size was normal in size. Right Atrium: Right atrial size was normal in size. Pericardium: There is no evidence of pericardial effusion. Mitral Valve: The mitral valve is normal in structure. Mild mitral valve regurgitation. No evidence of mitral valve stenosis. Tricuspid Valve: The tricuspid valve is normal in structure. Tricuspid valve regurgitation is trivial. No evidence of tricuspid stenosis. Aortic Valve: The aortic valve has been repaired/replaced. Aortic valve regurgitation is not visualized. Aortic valve mean gradient measures 6.0 mmHg. Aortic valve peak gradient measures 12.4 mmHg. Aortic valve area, by VTI measures 2.02 cm. There is a 23 mm Edwards INSPIRIS RESILIA pericardial valve valve present in the aortic position. Procedure Date: 07/29/21. Echo findings are consistent with normal structure and function of the aortic valve prosthesis. Pulmonic Valve: The pulmonic valve was normal in structure. Pulmonic valve regurgitation is trivial. No evidence of pulmonic stenosis. Aorta: The aortic root is normal in size and structure. Venous: The inferior vena cava is normal in size with greater than 50% respiratory variability, suggesting right atrial pressure of 3 mmHg. IAS/Shunts:  The atrial septum is grossly normal.  LEFT VENTRICLE PLAX 2D LVIDd:         5.00 cm   Diastology LVIDs:         3.60 cm   LV e' medial:    0.05 cm/s LV PW:         1.00 cm   LV E/e' medial:  21.8 LV IVS:        0.90 cm   LV e' lateral:   0.09 cm/s LVOT diam:     1.80 cm   LV E/e' lateral: 11.9 LV SV:         71 LV SV Index:   38 LVOT Area:     2.54 cm  RIGHT VENTRICLE            IVC RV Basal diam:  4.00 cm    IVC diam: 1.30 cm RV Mid diam:    3.00 cm RV S prime:     9.68 cm/s TAPSE (M-mode): 1.3 cm LEFT ATRIUM             Index        RIGHT ATRIUM           Index LA diam:        3.70 cm 1.98 cm/m   RA Area:     15.10 cm LA Vol (A2C):   40.6 ml 21.75 ml/m  RA Volume:   36.00 ml  19.29 ml/m LA Vol (A4C):   46.9 ml 25.13 ml/m LA Biplane Vol: 46.4 ml 24.86 ml/m  AORTIC VALVE  PULMONIC VALVE AV Area (Vmax):    2.08 cm      PV Vmax:       1.11 m/s AV Area (Vmean):   2.17 cm      PV Peak grad:  4.9 mmHg AV Area (VTI):     2.02 cm AV Vmax:           176.00 cm/s AV Vmean:          114.000 cm/s AV VTI:            0.351 m AV Peak Grad:      12.4 mmHg AV Mean Grad:      6.0 mmHg LVOT Vmax:         144.00 cm/s LVOT Vmean:        97.200 cm/s LVOT VTI:          0.279 m LVOT/AV VTI ratio: 0.79  AORTA Ao Root diam: 2.10 cm Ao Asc diam:  3.30 cm MITRAL VALVE              TRICUSPID VALVE MV Area (PHT): 3.33 cm   TR Peak grad:   4.2 mmHg MV Decel Time: 228 msec   TR Vmax:        103.00 cm/s MV E velocity: 1.04 cm/s MV A velocity: 6.30 cm/s  SHUNTS MV E/A ratio:  0.17       Systemic VTI:  0.28 m                           Systemic Diam: 1.80 cm Cherlynn Kaiser MD Electronically signed by Cherlynn Kaiser MD Signature Date/Time: 07/12/2022/2:50:58 PM    Final    US RENAL  Result Date: 07/12/2022 CLINICAL DATA:  [id number] AKI (acute kidney injury) ([place]) [id number] EXAM: RENAL / URINARY TRACT ULTRASOUND COMPLETE COMPARISON:  CT 07/07/2016 FINDINGS: Right Kidney: Renal measurements: 10.6 x 5.0 x 5.3 cm = volume:  45.88 mL. No hydronephrosis. Mildly increased renal cortical echogenicity. Left Kidney: Renal measurements: 10.8 x 4.8 x 5.0 cm = volume: 136.14 mL. No hydronephrosis. Mildly increased renal cortical echogenicity. Bladder: Appears normal for degree of bladder distention. Other: Small left pleural effusion noted. IMPRESSION: No hydronephrosis. Mildly increased renal cortical echogenicity bilaterally, as can be seen in medical renal disease. Electronically Signed   By: Maurine Simmering M.D.   On: 07/12/2022 14:19   DG Chest 2 View  Result Date: 07/11/2022 CLINICAL DATA:  Chest pain. EXAM: CHEST - 2 VIEW COMPARISON:  08/28/2021 FINDINGS: Patchy atelectasis or infiltrate noted at the lung bases with tiny bilateral pleural effusions evident. No pulmonary edema. The cardiopericardial silhouette is within normal limits for size. The visualized bony structures of the thorax are unremarkable. IMPRESSION: Patchy atelectasis or infiltrate at the lung bases with tiny bilateral pleural effusions. Electronically Signed   By: Misty Stanley M.D.   On: 07/11/2022 16:45     Scheduled Meds:  allopurinol  100 mg Oral Daily   apixaban  5 mg Oral BID   ezetimibe  10 mg Oral Daily   influenza vaccine adjuvanted  0.5 mL Intramuscular Tomorrow-1000   insulin aspart  0-6 Units Subcutaneous TID WC   levothyroxine  112 mcg Oral Q0600   metoprolol succinate  25 mg Oral Daily   Continuous Infusions:   LOS: 2 days    Time spent: 27min    Domenic Polite, MD [facility]   07/13/2022, 11:10 AM

## 2022-07-14 DIAGNOSIS — I5033 Acute on chronic diastolic (congestive) heart failure: Secondary | ICD-10-CM | POA: Diagnosis not present

## 2022-07-14 LAB — BASIC METABOLIC PANEL
Anion gap: 9 (ref 5–15)
Anion gap: 9 (ref 5–15)
BUN: 36 mg/dL — ABNORMAL HIGH (ref 8–23)
BUN: 35 mg/dL — ABNORMAL HIGH (ref 8–23)
CO2: 22 mmol/L (ref 22–32)
CO2: 21 mmol/L — ABNORMAL LOW (ref 22–32)
Calcium: 8.5 mg/dL — ABNORMAL LOW (ref 8.9–10.3)
Calcium: 8.5 mg/dL — ABNORMAL LOW (ref 8.9–10.3)
Chloride: 99 mmol/L (ref 98–111)
Chloride: 100 mmol/L (ref 98–111)
Creatinine, Ser: 2.28 mg/dL — ABNORMAL HIGH (ref 0.61–1.24)
Creatinine, Ser: 2.1 mg/dL — ABNORMAL HIGH (ref 0.61–1.24)
GFR, Estimated: 31 mL/min — ABNORMAL LOW (ref >60)
GFR, Estimated: 34 mL/min — ABNORMAL LOW (ref >60)
Glucose, Bld: 137 mg/dL — ABNORMAL HIGH (ref 70–99)
Glucose, Bld: 215 mg/dL — ABNORMAL HIGH (ref 70–99)
Potassium: 4.1 mmol/L (ref 3.5–5.1)
Potassium: 4.5 mmol/L (ref 3.5–5.1)
Sodium: 130 mmol/L — ABNORMAL LOW (ref 135–145)
Sodium: 130 mmol/L — ABNORMAL LOW (ref 135–145)

## 2022-07-14 LAB — GLUCOSE, CAPILLARY
Glucose-Capillary: 125 mg/dL — ABNORMAL HIGH (ref 70–99)
Glucose-Capillary: 150 mg/dL — ABNORMAL HIGH (ref 70–99)

## 2022-07-14 LAB — HEMOGLOBIN A1C
Hgb A1c MFr Bld: 7.6 % — ABNORMAL HIGH (ref 4.8–5.6)
Mean Plasma Glucose: 171 mg/dL

## 2022-07-14 MED ORDER — FUROSEMIDE 20 MG PO TABS: 40.0000 mg | ORAL_TABLET | Freq: Every day | ORAL | 1 refills | Status: DC

## 2022-07-14 MED ORDER — PREDNISONE 10 MG PO TABS: 30.0000 mg | ORAL_TABLET | Freq: Every day | ORAL | 1 refills | Status: DC

## 2022-07-14 NOTE — Plan of Care (Signed)

## 2022-07-14 NOTE — Discharge Summary (Signed)
Physician Discharge Summary  Antonio Cervantes [id number] DOB: 12/04/1955 DOA: 07/11/2022  PCP: Jettie Booze, MD  Admit date: 07/11/2022 Discharge date: 07/14/2022  Time spent: 45 minutes  Recommendations for Outpatient Follow-up:  PCP on 3/25, please check BMP at [facility] clinic in 2-3weeks Repeat TSH in 6 weeks   Discharge Diagnoses:  Acute on chronic diastolic CHF CAD/CABG  AVR 123XX123,  Chronic systolic CHF, ischemic cardiomyopathy with recovery, P.Atrial fibrillation on Eliquis   Discharge Condition: improved  Diet recommendation: low sodium heart healthy  Filed Weights   07/12/22 1436 07/13/22 0530 07/14/22 0458  Weight: 74.5 kg 74.3 kg 74.8 kg    History of present illness:  66/M with history of CAD/CABG and AVR 123XX123, chronic systolic CHF, ischemic cardiomyopathy with recovery, atrial fibrillation on Eliquis presented to the ED with chest tightness and shortness of breath.  In the ED his creatinine was 1.7, BNP 669, troponin 18, 22, potassium of 6, EKG with sinus rhythm, first-degree AV block and chest x-ray noted atelectasis with small bilateral effusions. -Admitted, started on diuretics, improved significantly, complicated by AKI  Hospital Course:   Acute on chronic diastolic CHF -Previously systolic CHF, now recovered EF -Last echo 6/23 with preserved EF, grade 2 diastolic dysfunction -Repeat echo with preserved EF, indeterminate diastolic dysfunction and mildly reduced RV -Volume status has improved considerably appears euvolemic, weight down 7lb, creatinine trended up slightly to 2.39, Lasix held temporarily, resumed at 40 Mg daily at discharge, Wilder Glade resumed as well -GDMT limited by CKD -PCP follow-up on 3/25, needs to have repeat BMP checked then   Acute kidney injury, hyperkalemia -Baseline creatinine around 1.6, trended up to 2.4 -recently taking NSAIDs daily for gout -Advised to discontinue NSAIDs and metformin -Renal US without hydronephrosis,  creatinine improving -Creatinine trending down to 2.1 this afternoon, discharged home on Lasix 40 Mg daily   CADs/p CABG -Flat troponin, no ACS -EKG no hyperacute findings -Echo with preserved EF, no wall motion abnormality -continue statin, metoprolol, asa,zetia    Paroxysmal Atrial fibrillation -Continue metoprolol and Eliquis   DMII -resumed Farxiga, metformin discontinued    HTN -continue metoprolol      Gout -continue allopurinol  -Has been taking NSAID's frequently for gout flare, sent a prescription for prednisone to be used as needed instead of NSAIDs    HLD -continue atorvastatin ,zetia    Hypothyroidism -elevated tsh ?  Compliance -resumed synthroid  -t3,t4 normal  Discharge Exam: Vitals:   07/14/22 0400 07/14/22 1105  BP: 132/71 (!) 127/53  Pulse: 61 66  Resp: 20   Temp: 98 F (36.7 C)   SpO2: 94%    General exam: Pleasant male sitting up in bed, AAOx3, no distress HEENT: no JVD CVS: S1-S2, regular rhythm Lungs: Clear bilaterally Abdomen: Soft, nontender, bowel sounds present Extremities: No edema  Skin: No rashes Psychiatry:  Mood & affect appropriate.    Discharge Instructions    Allergies as of 07/14/2022   No Known Allergies      Medication List     STOP taking these medications    indomethacin 25 MG capsule Commonly known as: INDOCIN   metFORMIN 850 MG tablet Commonly known as: GLUCOPHAGE       TAKE these medications    allopurinol 100 MG tablet Commonly known as: ZYLOPRIM Take 200 mg by mouth daily.   amoxicillin 500 MG tablet Commonly known as: AMOXIL Take 4 tablets by mouth one hour prior to dental appointments   Aspirin Low Dose 81 MG  tablet Generic drug: aspirin EC Take 1 tablet (81 mg total) by mouth daily. Swallow whole.   atorvastatin 80 MG tablet Commonly known as: LIPITOR Take 1 tablet (80 mg total) by mouth daily.   dapagliflozin propanediol 10 MG Tabs tablet Commonly known as: FARXIGA Take 1 tablet  (10 mg total) by mouth daily.   Eliquis 5 MG Tabs tablet Generic drug: apixaban TAKE 1 TABLET BY MOUTH TWICE A DAY   ezetimibe 10 MG tablet Commonly known as: ZETIA Take 1 tablet (10 mg total) by mouth daily.   furosemide 20 MG tablet Commonly known as: LASIX Take 2 tablets (40 mg total) by mouth daily. Start taking on: July 16, 2022 What changed:  how much to take when to take this reasons to take this These instructions start on July 16, 2022. If you are unsure what to do until then, ask your doctor or other care provider.   levothyroxine 112 MCG tablet Commonly known as: SYNTHROID Take 112 mcg by mouth daily.   metoprolol succinate 25 MG 24 hr tablet Commonly known as: Toprol XL Take 1 tablet (25 mg total) by mouth daily.   predniSONE 10 MG tablet Commonly known as: DELTASONE Take 3 tablets (30 mg total) by mouth daily. As needed for next Gout flare instead of Indomethacin       No Known Allergies  Follow-up Information     Herbert Deaner, FNP Follow up on 07/21/2022.   Specialty: Nurse Practitioner Why: 2:30 for hospital follow up, please have Labs, kidney funtion checked at follow up Contact information: [place] [address] [phone]                  The results of significant diagnostics from this hospitalization (including imaging, microbiology, ancillary and laboratory) are listed below for reference.    Significant Diagnostic Studies: VAS [place] LOWER EXTREMITY VENOUS (DVT)  Result Date: 07/13/2022  Lower Venous DVT Study Patient Name:  Antonio Cervantes  Date of Exam:   07/12/2022 Medical Rec #: [id number]          Accession #:    [id number] Date of Birth: Jul 30, 1955         Patient Gender: M Patient Age:   40 years Exam Location:  [facility] Procedure:      VAS [place] LOWER EXTREMITY VENOUS (DVT) Referring Phys: Myles Rosenthal --------------------------------------------------------------------------------  Indications:  Elevated D-Dimer.  Anticoagulation: Eliquis. Comparison Study: Prior negative right LEV done 07/08/16 Performing Technologist: Sharion Dove RVS  Examination Guidelines: A complete evaluation includes B-mode imaging, spectral Doppler, color Doppler, and power Doppler as needed of all accessible portions of each vessel. Bilateral testing is considered an integral part of a complete examination. Limited examinations for reoccurring indications may be performed as noted. The reflux portion of the exam is performed with the patient in reverse Trendelenburg.  +---------+---------------+---------+-----------+----------+--------------+ RIGHT    CompressibilityPhasicitySpontaneityPropertiesThrombus Aging +---------+---------------+---------+-----------+----------+--------------+ CFV      Full           Yes      Yes                                 +---------+---------------+---------+-----------+----------+--------------+ SFJ      Full                                                        +---------+---------------+---------+-----------+----------+--------------+  FV Prox  Full                                                        +---------+---------------+---------+-----------+----------+--------------+ FV Mid   Full           Yes      Yes                                 +---------+---------------+---------+-----------+----------+--------------+ FV DistalFull                                                        +---------+---------------+---------+-----------+----------+--------------+ PFV      Full                                                        +---------+---------------+---------+-----------+----------+--------------+ POP      Full           Yes      Yes                                 +---------+---------------+---------+-----------+----------+--------------+ PTV      Full                                                         +---------+---------------+---------+-----------+----------+--------------+ PERO     Full                                                        +---------+---------------+---------+-----------+----------+--------------+   +---------+---------------+---------+-----------+----------+--------------+ LEFT     CompressibilityPhasicitySpontaneityPropertiesThrombus Aging +---------+---------------+---------+-----------+----------+--------------+ CFV      Full           Yes      Yes                                 +---------+---------------+---------+-----------+----------+--------------+ SFJ      Full                                                        +---------+---------------+---------+-----------+----------+--------------+ FV Prox  Full                                                        +---------+---------------+---------+-----------+----------+--------------+  FV Mid   Full           Yes      Yes                                 +---------+---------------+---------+-----------+----------+--------------+ FV DistalFull                                                        +---------+---------------+---------+-----------+----------+--------------+ PFV      Full                                                        +---------+---------------+---------+-----------+----------+--------------+ POP      Full           Yes      Yes                                 +---------+---------------+---------+-----------+----------+--------------+ PTV      Full                                                        +---------+---------------+---------+-----------+----------+--------------+ PERO     Full                                                        +---------+---------------+---------+-----------+----------+--------------+     Summary: BILATERAL: - No evidence of deep vein thrombosis seen in the lower extremities, bilaterally. -No evidence of  popliteal cyst, bilaterally.   *See table(s) above for measurements and observations. Electronically signed by Servando Snare MD on 07/13/2022 at 10:36:35 AM.    Final    ECHOCARDIOGRAM COMPLETE  Result Date: 07/12/2022    ECHOCARDIOGRAM REPORT   Patient Name:   Antonio Cervantes Date of Exam: 07/12/2022 Medical Rec #:  [id number]         Height:       66.0 in Accession #:    [id number]        Weight:       170.0 lb Date of Birth:  Sep 22, 1955        BSA:          1.866 m Patient Age:    23 years          BP:           124/93 mmHg Patient Gender: M                 HR:           68 bpm. Exam Location:  Inpatient Procedure: 2D Echo, Cardiac Doppler, Color Doppler and Intracardiac            Opacification Agent Indications:    CHF I50.31  History:  Patient has prior history of Echocardiogram examinations, most                 recent 10/18/2021. Cardiomyopathy, Previous Myocardial Infarction                 and CAD, Prior CABG, Aortic Valve Disease; Risk                 Factors:Dyslipidemia, Former Smoker and Diabetes.                 Aortic Valve: 23 mm Edwards INSPIRIS RESILIA pericardial valve                 valve is present in the aortic position. Procedure Date: 07/29/21.  Sonographer:    Wilkie Aye RVT RCS Referring Phys: [id number] [facility] A THOMAS  Sonographer Comments: Suboptimal apical window and suboptimal parasternal window. IMPRESSIONS  1. Left ventricular ejection fraction, by estimation, is 55 to 60%. The left ventricle has normal function. The left ventricle has no regional wall motion abnormalities. Left ventricular diastolic parameters are indeterminate.  2. Right ventricular systolic function is mildly reduced. The right ventricular size is normal. Tricuspid regurgitation signal is inadequate for assessing PA pressure.  3. The mitral valve is normal in structure. Mild mitral valve regurgitation. No evidence of mitral stenosis.  4. The aortic valve has been repaired/replaced. Aortic valve  regurgitation is not visualized. There is a 23 mm Edwards INSPIRIS RESILIA pericardial valve valve present in the aortic position. Procedure Date: 07/29/21. Echo findings are consistent with normal structure and function of the aortic valve prosthesis. Aortic valve area, by VTI measures 2.02 cm. Aortic valve mean gradient measures 6.0 mmHg. Aortic valve Vmax measures 1.76 m/s.  5. The inferior vena cava is normal in size with greater than 50% respiratory variability, suggesting right atrial pressure of 3 mmHg. FINDINGS  Left Ventricle: Left ventricular ejection fraction, by estimation, is 55 to 60%. The left ventricle has normal function. The left ventricle has no regional wall motion abnormalities. The left ventricular internal cavity size was normal in size. There is  no left ventricular hypertrophy. Left ventricular diastolic parameters are indeterminate. Right Ventricle: The right ventricular size is normal. No increase in right ventricular wall thickness. Right ventricular systolic function is mildly reduced. Tricuspid regurgitation signal is inadequate for assessing PA pressure. The tricuspid regurgitant velocity is 1.03 m/s, and with an assumed right atrial pressure of 3 mmHg, the estimated right ventricular systolic pressure is 7.2 mmHg. Left Atrium: Left atrial size was normal in size. Right Atrium: Right atrial size was normal in size. Pericardium: There is no evidence of pericardial effusion. Mitral Valve: The mitral valve is normal in structure. Mild mitral valve regurgitation. No evidence of mitral valve stenosis. Tricuspid Valve: The tricuspid valve is normal in structure. Tricuspid valve regurgitation is trivial. No evidence of tricuspid stenosis. Aortic Valve: The aortic valve has been repaired/replaced. Aortic valve regurgitation is not visualized. Aortic valve mean gradient measures 6.0 mmHg. Aortic valve peak gradient measures 12.4 mmHg. Aortic valve area, by VTI measures 2.02 cm. There is a 23 mm  Edwards INSPIRIS RESILIA pericardial valve valve present in the aortic position. Procedure Date: 07/29/21. Echo findings are consistent with normal structure and function of the aortic valve prosthesis. Pulmonic Valve: The pulmonic valve was normal in structure. Pulmonic valve regurgitation is trivial. No evidence of pulmonic stenosis. Aorta: The aortic root is normal in size and structure. Venous: The inferior vena cava is normal in size  with greater than 50% respiratory variability, suggesting right atrial pressure of 3 mmHg. IAS/Shunts: The atrial septum is grossly normal.  LEFT VENTRICLE PLAX 2D LVIDd:         5.00 cm   Diastology LVIDs:         3.60 cm   LV e' medial:    0.05 cm/s LV PW:         1.00 cm   LV E/e' medial:  21.8 LV IVS:        0.90 cm   LV e' lateral:   0.09 cm/s LVOT diam:     1.80 cm   LV E/e' lateral: 11.9 LV SV:         71 LV SV Index:   38 LVOT Area:     2.54 cm  RIGHT VENTRICLE            IVC RV Basal diam:  4.00 cm    IVC diam: 1.30 cm RV Mid diam:    3.00 cm RV S prime:     9.68 cm/s TAPSE (M-mode): 1.3 cm LEFT ATRIUM             Index        RIGHT ATRIUM           Index LA diam:        3.70 cm 1.98 cm/m   RA Area:     15.10 cm LA Vol (A2C):   40.6 ml 21.75 ml/m  RA Volume:   36.00 ml  19.29 ml/m LA Vol (A4C):   46.9 ml 25.13 ml/m LA Biplane Vol: 46.4 ml 24.86 ml/m  AORTIC VALVE                     PULMONIC VALVE AV Area (Vmax):    2.08 cm      PV Vmax:       1.11 m/s AV Area (Vmean):   2.17 cm      PV Peak grad:  4.9 mmHg AV Area (VTI):     2.02 cm AV Vmax:           176.00 cm/s AV Vmean:          114.000 cm/s AV VTI:            0.351 m AV Peak Grad:      12.4 mmHg AV Mean Grad:      6.0 mmHg LVOT Vmax:         144.00 cm/s LVOT Vmean:        97.200 cm/s LVOT VTI:          0.279 m LVOT/AV VTI ratio: 0.79  AORTA Ao Root diam: 2.10 cm Ao Asc diam:  3.30 cm MITRAL VALVE              TRICUSPID VALVE MV Area (PHT): 3.33 cm   TR Peak grad:   4.2 mmHg MV Decel Time: 228 msec   TR Vmax:         103.00 cm/s MV E velocity: 1.04 cm/s MV A velocity: 6.30 cm/s  SHUNTS MV E/A ratio:  0.17       Systemic VTI:  0.28 m                           Systemic Diam: 1.80 cm Cherlynn Kaiser MD Electronically signed by Cherlynn Kaiser MD Signature Date/Time: 07/12/2022/2:50:58 PM    Final    US RENAL  Result Date: 07/12/2022 CLINICAL DATA:  [id number] AKI (acute kidney injury) ([place]) [id number] EXAM: RENAL / URINARY TRACT ULTRASOUND COMPLETE COMPARISON:  CT 07/07/2016 FINDINGS: Right Kidney: Renal measurements: 10.6 x 5.0 x 5.3 cm = volume: 45.88 mL. No hydronephrosis. Mildly increased renal cortical echogenicity. Left Kidney: Renal measurements: 10.8 x 4.8 x 5.0 cm = volume: 136.14 mL. No hydronephrosis. Mildly increased renal cortical echogenicity. Bladder: Appears normal for degree of bladder distention. Other: Small left pleural effusion noted. IMPRESSION: No hydronephrosis. Mildly increased renal cortical echogenicity bilaterally, as can be seen in medical renal disease. Electronically Signed   By: Maurine Simmering M.D.   On: 07/12/2022 14:19   DG Chest 2 View  Result Date: 07/11/2022 CLINICAL DATA:  Chest pain. EXAM: CHEST - 2 VIEW COMPARISON:  08/28/2021 FINDINGS: Patchy atelectasis or infiltrate noted at the lung bases with tiny bilateral pleural effusions evident. No pulmonary edema. The cardiopericardial silhouette is within normal limits for size. The visualized bony structures of the thorax are unremarkable. IMPRESSION: Patchy atelectasis or infiltrate at the lung bases with tiny bilateral pleural effusions. Electronically Signed   By: Misty Stanley M.D.   On: 07/11/2022 16:45    Microbiology: Recent Results (from the past 240 hour(s))  Respiratory (~20 pathogens) panel by PCR     Status: None   Collection Time: 07/11/22 11:02 PM   Specimen: Nasopharyngeal Swab; Respiratory  Result Value Ref Range Status   Adenovirus NOT DETECTED NOT DETECTED Final   Coronavirus 229E NOT DETECTED NOT DETECTED Final     Comment: (NOTE) The Coronavirus on the Respiratory Panel, DOES NOT test for the novel  Coronavirus (2019 nCoV)    Coronavirus HKU1 NOT DETECTED NOT DETECTED Final   Coronavirus NL63 NOT DETECTED NOT DETECTED Final   Coronavirus OC43 NOT DETECTED NOT DETECTED Final   Metapneumovirus NOT DETECTED NOT DETECTED Final   Rhinovirus / Enterovirus NOT DETECTED NOT DETECTED Final   Influenza A NOT DETECTED NOT DETECTED Final   Influenza B NOT DETECTED NOT DETECTED Final   Parainfluenza Virus 1 NOT DETECTED NOT DETECTED Final   Parainfluenza Virus 2 NOT DETECTED NOT DETECTED Final   Parainfluenza Virus 3 NOT DETECTED NOT DETECTED Final   Parainfluenza Virus 4 NOT DETECTED NOT DETECTED Final   Respiratory Syncytial Virus NOT DETECTED NOT DETECTED Final   Bordetella pertussis NOT DETECTED NOT DETECTED Final   Bordetella Parapertussis NOT DETECTED NOT DETECTED Final   Chlamydophila pneumoniae NOT DETECTED NOT DETECTED Final   Mycoplasma pneumoniae NOT DETECTED NOT DETECTED Final    Comment: Performed at [facility], [address] [place], [place] [address]     Labs: Basic Metabolic Panel: Recent Labs  Lab 07/11/22 1550 07/11/22 1759 07/11/22 1904 07/11/22 1948 07/12/22 0424 07/12/22 0813 07/13/22 0106 07/14/22 0038  NA 133*  --   --   --   --  136 133* 130*  K 6.0*  --    < > 3.7 4.7 4.3 4.0 4.1  CL 104  --   --   --   --  107 102 99  CO2 18*  --   --   --   --  19* 20* 22  GLUCOSE 158* 119*  --   --   --  115* 163* 137*  BUN 34*  --   --   --   --  33* 37* 36*  CREATININE 1.77*  --   --   --   --  2.05* 2.39* 2.28*  CALCIUM  9.2  --   --   --   --  8.4* 8.8* 8.5*   < > = values in this interval not displayed.   Liver Function Tests: Recent Labs  Lab 07/11/22 1904 07/12/22 0813  AST 20 15  ALT 12 12  ALKPHOS 71 60  BILITOT 0.6 0.7  PROT 6.5 5.9*  ALBUMIN 3.4* 3.1*   No results for input(s): "LIPASE", "AMYLASE" in the last 168 hours. No results for input(s):  "AMMONIA" in the last 168 hours. CBC: Recent Labs  Lab 07/11/22 1550 07/12/22 0813 07/13/22 0106  WBC 11.6* 9.1 11.4*  HGB 11.5* 9.2* 10.3*  HCT 36.3* 27.5* 30.3*  MCV 96.0 91.7 90.4  PLT 289 192 198   Cardiac Enzymes: No results for input(s): "CKTOTAL", "CKMB", "CKMBINDEX", "TROPONINI" in the last 168 hours. BNP: BNP (last 3 results) Recent Labs    07/22/21 2244 05/22/22 1229 07/11/22 1817  BNP 237.3* 335.3* 669.5*    ProBNP (last 3 results) No results for input(s): "PROBNP" in the last 8760 hours.  CBG: Recent Labs  Lab 07/13/22 0603 07/13/22 1121 07/13/22 1645 07/14/22 0601 07/14/22 1122  GLUCAP 134* 169* 200* 125* 150*       Signed:  Domenic Polite MD.  [facility] 07/14/2022, 2:30 PM

## 2022-07-14 NOTE — Progress Notes (Signed)
Heart Failure Navigator Progress Note  Assessed for Heart & Vascular TOC clinic readiness.  Patient does not meet criteria due to Advanced Heart Failure patient of Dr. Bensimhon.   Navigator will sign off at this time.    Marcile Fuquay, BSN, RN Heart Failure Nurse Navigator Secure Chat Only   

## 2022-07-14 NOTE — TOC Transition Note (Addendum)
Transition of Care [facility]) - CM/SW Discharge Note   Patient Details  Name: Antonio Cervantes MRN: [id number] Date of Birth: Apr 15, 1956  [facility] CM/SW Contact:  Zenon Mayo, RN Phone Number: 07/14/2022, 11:22 AM   Clinical Narrative:    Patient is for poss dc today, he stats he has a walker at home, he lives with his 68 yo mother who he takes care of.  His DIL will transport him home at dc.  Hospital follow up apt on AVS.  He has no other needs. He PCP office would like the DC summary to be faxed to [phone], will fax once completed.         Patient Goals and CMS Choice      Discharge Placement                         Discharge Plan and Services Additional resources added to the After Visit Summary for                                       Social Determinants of Health (SDOH) Interventions SDOH Screenings   Food Insecurity: No Food Insecurity (07/12/2022)  Housing: Low Risk  (07/12/2022)  Transportation Needs: No Transportation Needs (07/12/2022)  Utilities: Not At Risk (07/12/2022)  Alcohol Screen: Low Risk  (05/21/2021)  Financial Resource Strain: Low Risk  (05/21/2021)  Tobacco Use: Medium Risk (07/11/2022)     Readmission Risk Interventions    08/05/2021   10:07 AM  Readmission Risk Prevention Plan  Transportation Screening Complete  PCP or Specialist Appt within 5-7 Days Complete  Home Care Screening Complete  Medication Review (RN CM) Complete

## 2022-07-14 NOTE — Consult Note (Signed)
   [facility] CM Inpatient Consult   07/14/2022  AKING SHULMAN 10-29-1955 [id number]  [place] [facility] [ACO] Patient: [employer]  Primary Care Provider:  Jettie Booze, MD, Cardiologist - patient confirms he has no primary care provider at this time, had been with [facility]   Patient screened for hospitalization with noted medium risk score for unplanned readmission risk land  to assess for potential [facility] service needs for post hospital transition for care coordination.  Review of patient's electronic medical record reveals patient for home likely today with no SDOH needs assessed.  3:00 PM Met with the patient at the bedside and states he no longer has Dr. Daiva Eves as his provider. He may try to another provider in his area.  Plan:  He states no needs noted.  Of note, [facility] Care Management/Population Health does not replace or interfere with any arrangements made by the Inpatient Transition of Care team.  For questions contact:   Natividad Brood, RN BSN [place]  [phone] business mobile phone Toll free office [phone]  *[place]  [phone] Fax number: [phone] [email or web address]

## 2022-07-14 NOTE — Progress Notes (Signed)
PT alert and oriented ambulating independently.  Reviewed discharge orders, all questions reviewed. PT waiting for family to transport home.

## 2022-07-17 ENCOUNTER — Telehealth: Payer: Self-pay

## 2022-07-17 NOTE — Transitions of Care (Post Inpatient/ED Visit) (Signed)
   07/17/2022  Name: Antonio Cervantes MRN: [id number] DOB: March 25, 1956  Today's TOC FU Call Status: Today's TOC FU Call Status:: Successful TOC FU Call Competed TOC FU Call Complete Date: 07/17/22 (Red on EMMI-ED Discharge Alert Date & Reason: 07/16/22 "Scheduled follow-up appt? No")  Transition Care Management Follow-up Telephone Call Date of Discharge: 07/14/22 Discharge Facility: [facility]) Type of Discharge: Inpatient Admission Primary Inpatient Discharge Diagnosis:: "hyperkalemia,SOB,CP" How have you been since you were released from the hospital?: Better (Patient states he is doing and feeing good-denies any acute cardiac/resp issue. appeitite good and sleeping well. Unsure of LBM-has OTC laxative/stool softener in home(can't recall name and not near bottle) will take some today) Any questions or concerns?: No  Items Reviewed: Did you receive and understand the discharge instructions provided?: Yes Medications obtained and verified?: Yes (Medications Reviewed) Any new allergies since your discharge?: No Dietary orders reviewed?: Yes Type of Diet Ordered:: low salt/heart healthy Do you have support at home?: Yes People in Home: alone Name of Support/Comfort Primary Source: daughter in law helps him out as needed  [place] and Equipment/Supplies: [place] Ordered?: NA Any new equipment or medical supplies ordered?: NA  Functional Questionnaire: Do you need assistance with bathing/showering or dressing?: No Do you need assistance with meal preparation?: No Do you need assistance with eating?: No Do you have difficulty maintaining continence: No Do you need assistance with getting out of bed/getting out of a chair/moving?: No Do you have difficulty managing or taking your medications?: No  Follow up appointments reviewed: PCP Follow-up appointment confirmed?: Yes (Patient was arranged with PCP appt to establish care prior to d/c) Date of PCP follow-up  appointment?: 07/25/22 Follow-up Provider: [facility] Follow-up appointment confirmed?: No Reason Specialist Follow-Up Not Confirmed: Patient has Specialist Provider Number and will Call for Appointment Do you need transportation to your follow-up appointment?: No (pt states daughter in law takes him to appts) Do you understand care options if your condition(s) worsen?: Yes-patient verbalized understanding  SDOH Interventions Today    Flowsheet Row Most Recent Value  SDOH Interventions   Food Insecurity Interventions Intervention Not Indicated  Transportation Interventions Intervention Not Indicated      TOC Interventions Today    Flowsheet Row Most Recent Value  TOC Interventions   TOC Interventions Discussed/Reviewed TOC Interventions Discussed      Interventions Today    Flowsheet Row Most Recent Value  Chronic Disease   Chronic disease during today's visit Congestive Heart Failure (CHF)  General Interventions   General Interventions Discussed/Reviewed General Interventions Discussed, Doctor Visits  Doctor Visits Discussed/Reviewed Doctor Visits Discussed, PCP  PCP/Specialist Visits Compliance with follow-up visit  Exercise Interventions   Exercise Discussed/Reviewed Weight Managment  [monitoring wgt daily and alerting MD of abnormal wgt gain/fluid retention]  Education Interventions   Education Provided Provided Education  [bowel regimen, sx mgmt]  Provided Verbal Education On Nutrition, When to see the doctor, Medication  Nutrition Interventions   Nutrition Discussed/Reviewed Nutrition Discussed, Adding fruits and vegetables, Decreasing salt  Pharmacy Interventions   Pharmacy Dicussed/Reviewed Pharmacy Topics Discussed, Medications and their functions  Safety Interventions   Safety Discussed/Reviewed Safety Discussed       Hetty Blend [facility] Care Management Care Management Community Coordinator Direct  Phone: [phone] Toll Free: [phone] Fax: [phone]

## 2022-07-18 NOTE — Progress Notes (Signed)
ADVANCED HF CLINIC NOTE  PCP: Meade Maw, FNP Primary Cardiologist: Herbert Deaner, FNP HF Cardiologist: Dr. Haroldine Laws  HPI: Antonio Cervantes 67 y.o. male w/ T2DM, remote tobacco use, systolic heart failure/iCM, CAD, AF, and HLD.   Admitted 1/23 for NSTEMI and found to be in acute CHF and afib w/ RVR, of unknown duration but spontaneously converted back to NSR. Echo with severely reduced LVEF, 20-25%, RV normal. LHC showed severe MVCAD, however given diffuse, distal vessel disease, there were no good options for revascularization (Diffuse inoperable CAD, targets not amendable to PCI). RHC showed normal filling pressures and normal CO  5.4 L/min, cardiac index 2.88. Medical therapy for CAD was recommended. Placed on ASA, statin and ? blocker. GDMT limited by soft BP and renal insuffiency, SCr ~1.5 during hospitalization (baseline unknown). Eliquis started for Afib. Referred to [facility] clinic.    Of note, there was also concern for potential aortic stenosis base on echo interpretation, "degree of AS hard to judge due to severe decrease in LV function DVI 0.55 AVA 2 cm2 but morphology and gradients suggest AS could be moderate" .   Seen in [facility] 2/23, stable NYHA I-II symptoms. GDMT titrated. Follow up in AHF 3/23 with stable NYHA II symptoms, euvolemic. Started on low-dose losartan.  Re-admitted 3/23 with NSTEMI. Underwent cMRI showing LVEF 47%, normal RVEF, viable multi-vessel disease. Moderate AI. CVTS consulted and underwent CABG x 4 and aortic valve replacement. Course complicated by persistent AF despite amiodarone and started on Coumadin (plan to continue for 3 months then switch to Eliquis). Arlyce Harman, beta blocker, and Lasix stopped with low BP and bradycardia. Discharged home, weight 163 lbs.  Echo 10/18/21 EF normalized, 55-60% G2DD AoV ok.   Follow up 1/24, doing well NYHA II.   Admitted 3/24 with a/c dHF. Diuresed with IV lasix, complicated by AKI on CKD. Had been taking NSAID for  gout. Echo showed EF 55-60%, RV ok. He was discharged home, weight 164 lbs.  Today he returns for post hospital HF follow up with his daughter and granddaugther. Overall feeling fine. He has SOB going up and down steps. Denies palpitations, CP, dizziness, edema, or PND/Orthopnea. Appetite ok. No fever or chills. Weight at home 170-172 pounds. Taking all medications, he manages his medications but several discrepencies from AVS from hospitalization. Has been taking losartan 25 mg daily and Lasix PRN.    Cardiac Testing  - Echo (3/24): EF 55-60%, RV ok, AoV ok  - Echo 6/23 EF 55-60%, GDDD, AoV ok   - cMRI (3/23): LVEF 47%, normal RVEF, mild to moderate AI, study suggests viable multi-vessel disease.  - Echo (3/23): EF 50%, mild LVH, RV ok, mild AI  - Echo (1/23): EF 20-25%, severe LV dysfunction, mild LVH, RV ok, mild MR, degree of AS hard to judge due to severe decrease in LV function DVI 0.55 AVA 2 cm2 but morphology and gradients suggest AS could be moderate .   - LHC/RHC (05/21/21):   Prox LAD to Mid LAD lesion is 75% stenosed.   Dist LAD lesion is 80% stenosed.   1st Diag lesion is 80% stenosed.   Ost RCA to Mid RCA lesion is 99% stenosed.   2nd Mrg lesion is 99% stenosed.   Dist Cx lesion is 90% stenosed.   Prox Cx to Mid Cx lesion is 75% stenosed.   LV end diastolic pressure is normal.   There is no aortic valve stenosis.   Severe, multivessel coronary artery disease.   Normal LVEDP.  Given diffuse, distal vessel disease, there are no good options for revascularization.   Right heart pressures: RA mean 2 PA pressure 26/10 mean 17 PCWP mean 6 CO/CI 5.4/2.88 (Fick)  ROS: All systems reviewed and negative except as per HPI.   Past Medical History:  Diagnosis Date   Diabetes mellitus without complication ([place])    Diabetic peripheral neuropathy associated with type 2 diabetes mellitus ([place])    Current Outpatient Medications  Medication Sig Dispense Refill   allopurinol  (ZYLOPRIM) 100 MG tablet Take 200 mg by mouth daily.     amoxicillin (AMOXIL) 500 MG tablet Take 4 tablets by mouth one hour prior to dental appointments 4 tablet 3   aspirin 81 MG EC tablet Take 1 tablet (81 mg total) by mouth daily. Swallow whole. 30 tablet 11   atorvastatin (LIPITOR) 80 MG tablet Take 1 tablet (80 mg total) by mouth daily. 30 tablet 0   dapagliflozin propanediol (FARXIGA) 10 MG TABS tablet Take 1 tablet (10 mg total) by mouth daily. 30 tablet 8   ELIQUIS 5 MG TABS tablet TAKE 1 TABLET BY MOUTH TWICE A DAY 30 tablet 6   ezetimibe (ZETIA) 10 MG tablet Take 1 tablet (10 mg total) by mouth daily. 90 tablet 3   furosemide (LASIX) 20 MG tablet Take 20 mg by mouth as needed.     levothyroxine (SYNTHROID) 112 MCG tablet Take 112 mcg by mouth daily.     losartan (COZAAR) 25 MG tablet Take 25 mg by mouth daily.     metoprolol succinate (TOPROL XL) 25 MG 24 hr tablet Take 1 tablet (25 mg total) by mouth daily. 90 tablet 3   predniSONE (DELTASONE) 10 MG tablet Take 3 tablets (30 mg total) by mouth daily. As needed for next Gout flare instead of Indomethacin 9 tablet 1   No current facility-administered medications for this encounter.   No Known Allergies  Social History   Socioeconomic History   Marital status: Married    Spouse name: Not on file   Number of children: 1   Years of education: Not on file   Highest education level: 10th grade  Occupational History   Occupation: retired  Tobacco Use   Smoking status: Former    Packs/day: 1.50    Years: 15.00    Additional pack years: 0.00    Total pack years: 22.50    Types: Cigarettes    Quit date: 1983    Years since quitting: 41.2   Smokeless tobacco: Never  Vaping Use   Vaping Use: Never used  Substance and Sexual Activity   Alcohol use: No   Drug use: No   Sexual activity: Not on file  Other Topics Concern   Not on file  Social History Narrative   Not on file   Social Determinants of Health   Financial  Resource Strain: Low Risk  (05/21/2021)   Overall Financial Resource Strain (CARDIA)    Difficulty of Paying Living Expenses: Not very hard  Food Insecurity: No Food Insecurity (07/17/2022)   Hunger Vital Sign    Worried About Running Out of Food in the Last Year: Never true    [place] in the Last Year: Never true  Transportation Needs: No Transportation Needs (07/17/2022)   PRAPARE - Hydrologist (Medical): No    Lack of Transportation (Non-Medical): No  Physical Activity: Not on file  Stress: Not on file  Social Connections: Not on file  Intimate  Partner Violence: Not At Risk (07/12/2022)   Humiliation, Afraid, Rape, and Kick questionnaire    Fear of Current or Ex-Partner: No    Emotionally Abused: No    Physically Abused: No    Sexually Abused: No   Family History  Problem Relation Age of Onset   Heart attack Maternal Uncle    Sudden Cardiac Death Neg Hx    Wt Readings from Last 3 Encounters:  07/25/22 78.2 kg (172 lb 6.4 oz)  07/14/22 74.8 kg (164 lb 14.5 oz)  07/02/22 77.6 kg (171 lb)   BP (!) 164/80   Pulse 62   Wt 78.2 kg (172 lb 6.4 oz)   SpO2 99%   BMI 27.83 kg/m   PHYSICAL EXAM: General:  NAD. No resp difficulty, walked into clinic HEENT: Normal Neck: Supple. JVP 7-8. Carotids 2+ bilat; no bruits. No lymphadenopathy or thryomegaly appreciated. Cor: PMI nondisplaced. Regular rate & rhythm. No rubs, gallops or murmurs. Lungs: Clear Abdomen: Soft, nontender, nondistended. No hepatosplenomegaly. No bruits or masses. Good bowel sounds. Extremities: No cyanosis, clubbing, rash, trace BLE edema Neuro: Alert & oriented x 3, cranial nerves grossly intact. Moves all 4 extremities w/o difficulty. Affect pleasant.  ECG (personally reviewed): SB 59 bpm  REDs: 40%  ASSESSMENT & PLAN:  1. 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  - Echo 1/23 EF 20-25%, RV ok - LHC (1/23): w/ severe MVCAD. RHC w/ normal filling pressures and  preserved CO 5.4 L/min, cardiac index 2.88 - Echo (3/23): improved EF 50%, mild LVH, RV ok, mild AI - cMRI (3/23): LVEF 47%, RVEF 48%, viable multi-vessel disease, moderate AI - s/p CABG 4/23 - Echo 6/23 EF  normalized, 55-60% G2DD Aov ok   - Echo (3/24): EF 55-60%, RV ok - NYHA II. Volume up, ReDs 40% (although exam not suggestive for massive volume overload). - Change Lasix to 40 mg daily, add 20 KCL daily. - Continue losartan 25 mg daily. May need to stop pending renal function - Continue Toprol XL 25 mg daily. - Continue Farxiga 10 mg daily. No GU symptoms. - Labs today. Repeat BMET in 10 days.   2. CAD - NSTEMI 1/23, LHC w/ severe, diffuse MVCAD--> medically managed. - NSTEMI 3/23, s/p CABG x 4, LIMA to LAD, SVG to diagonal, SVG to OM, SVG to distal RCA and AVR - No chest pain  - Continue ASA 81 + atorva 80 daily + beta blocker. - LDL 53 (1/24)   3. PAF - SR on ECG today. - Continue Toprol. - Continue Eliquis 5 mg bid. Denies abnormal bleeding.  - CBC today.  4. Aortic Stenosis  - s/p aortic valve replacement, stable on echo 6/23 & 3/24 - Aware of SBE prophylaxis   5. CKD IIIa - Baseline SCr ~1.5 - Continue SLGT2i - Labs today.   6. Type 2DM - Hgb A1c 7.6  - followed by PCP  - On SGLT2i   7. HLD  - Continue atorva 80 mg - LDL 53 (1/24), LDL goal < 70   8. Remote Tobacco Use - Quit 40 years ago.   Follow up in 6 weeks with APP (consider paramedicine if medication discrepancies) and 6 months with Dr. Haroldine Laws.   Rafael Bihari, FNP  10:16 AM

## 2022-07-25 ENCOUNTER — Ambulatory Visit (HOSPITAL_COMMUNITY)
Admit: 2022-07-25 | Discharge: 2022-07-25 | Disposition: A | Discharge status: Home / Self Care | Payer: Medicare Other | Attending: Family Medicine | Admitting: Family Medicine

## 2022-07-25 ENCOUNTER — Encounter (HOSPITAL_COMMUNITY): Payer: Self-pay

## 2022-07-25 VITALS — BP 166/78 | HR 62 | Wt 172.4 lb

## 2022-07-25 DIAGNOSIS — Z87891 Personal history of nicotine dependence: Secondary | ICD-10-CM | POA: Diagnosis not present

## 2022-07-25 DIAGNOSIS — I252 Old myocardial infarction: Secondary | ICD-10-CM | POA: Diagnosis not present

## 2022-07-25 DIAGNOSIS — I5020 Unspecified systolic (congestive) heart failure: Secondary | ICD-10-CM

## 2022-07-25 DIAGNOSIS — I350 Nonrheumatic aortic (valve) stenosis: Secondary | ICD-10-CM | POA: Insufficient documentation

## 2022-07-25 DIAGNOSIS — Z951 Presence of aortocoronary bypass graft: Secondary | ICD-10-CM | POA: Diagnosis not present

## 2022-07-25 DIAGNOSIS — I480 Paroxysmal atrial fibrillation: Secondary | ICD-10-CM

## 2022-07-25 DIAGNOSIS — N1831 Chronic kidney disease, stage 3a: Secondary | ICD-10-CM | POA: Diagnosis not present

## 2022-07-25 DIAGNOSIS — I2510 Atherosclerotic heart disease of native coronary artery without angina pectoris: Secondary | ICD-10-CM | POA: Insufficient documentation

## 2022-07-25 DIAGNOSIS — I5032 Chronic diastolic (congestive) heart failure: Secondary | ICD-10-CM | POA: Diagnosis not present

## 2022-07-25 DIAGNOSIS — E785 Hyperlipidemia, unspecified: Secondary | ICD-10-CM | POA: Diagnosis not present

## 2022-07-25 DIAGNOSIS — I255 Ischemic cardiomyopathy: Secondary | ICD-10-CM | POA: Insufficient documentation

## 2022-07-25 DIAGNOSIS — E1122 Type 2 diabetes mellitus with diabetic chronic kidney disease: Secondary | ICD-10-CM | POA: Diagnosis not present

## 2022-07-25 DIAGNOSIS — N183 Chronic kidney disease, stage 3 unspecified: Secondary | ICD-10-CM | POA: Diagnosis not present

## 2022-07-25 DIAGNOSIS — I5022 Chronic systolic (congestive) heart failure: Secondary | ICD-10-CM | POA: Insufficient documentation

## 2022-07-25 DIAGNOSIS — Z79899 Other long term (current) drug therapy: Secondary | ICD-10-CM | POA: Diagnosis not present

## 2022-07-25 DIAGNOSIS — Z952 Presence of prosthetic heart valve: Secondary | ICD-10-CM | POA: Diagnosis not present

## 2022-07-25 DIAGNOSIS — Z7901 Long term (current) use of anticoagulants: Secondary | ICD-10-CM | POA: Insufficient documentation

## 2022-07-25 LAB — BASIC METABOLIC PANEL
Anion gap: 11 (ref 5–15)
BUN: 36 mg/dL — ABNORMAL HIGH (ref 8–23)
CO2: 23 mmol/L (ref 22–32)
Calcium: 9.3 mg/dL (ref 8.9–10.3)
Chloride: 105 mmol/L (ref 98–111)
Creatinine, Ser: 2.03 mg/dL — ABNORMAL HIGH (ref 0.61–1.24)
GFR, Estimated: 35 mL/min — ABNORMAL LOW (ref >60)
Glucose, Bld: 228 mg/dL — ABNORMAL HIGH (ref 70–99)
Potassium: 4.7 mmol/L (ref 3.5–5.1)
Sodium: 139 mmol/L (ref 135–145)

## 2022-07-25 LAB — CBC
HCT: 32.7 % — ABNORMAL LOW (ref 39.0–52.0)
Hemoglobin: 10.6 g/dL — ABNORMAL LOW (ref 13.0–17.0)
MCH: 29.9 pg (ref 26.0–34.0)
MCHC: 32.4 g/dL (ref 30.0–36.0)
MCV: 92.4 fL (ref 80.0–100.0)
Platelets: 276 10*3/uL (ref 150–400)
RBC: 3.54 MIL/uL — ABNORMAL LOW (ref 4.22–5.81)
RDW: 14.6 % (ref 11.5–15.5)
WBC: 11.2 10*3/uL — ABNORMAL HIGH (ref 4.0–10.5)
nRBC: 0 % (ref 0.0–0.2)

## 2022-07-25 LAB — BRAIN NATRIURETIC PEPTIDE: B Natriuretic Peptide: 416.2 pg/mL — ABNORMAL HIGH (ref 0.0–100.0)

## 2022-07-25 MED ORDER — FUROSEMIDE 20 MG PO TABS: 40.0000 mg | ORAL_TABLET | Freq: Every day | ORAL | 11 refills | Status: DC

## 2022-07-25 MED ORDER — POTASSIUM CHLORIDE CRYS ER 20 MEQ PO TBCR: 20.0000 meq | EXTENDED_RELEASE_TABLET | Freq: Every day | ORAL | 3 refills | Status: DC

## 2022-07-25 NOTE — Progress Notes (Signed)
ReDS Vest / Clip - 07/25/22 1000       ReDS Vest / Clip   Station Marker C    Ruler Value 32.5    ReDS Value Range Moderate volume overload    ReDS Actual Value 40

## 2022-07-25 NOTE — Patient Instructions (Addendum)
RESTART Lasix 40 mg daily START Potassium 20 meq one tab daily  Labs today We will only contact you if something comes back abnormal or we need to make some changes. Otherwise no news is good news!  Labs needed in 10 days   Your physician recommends that you schedule a follow-up appointment in: 6 weeks  in the Advanced Practitioners (PA/NP) Clinic  And in 6 months with Dr Haroldine Laws (please call closer to this time to get an appointment)  Do the following things EVERYDAY: Weigh yourself in the morning before breakfast. Write it down and keep it in a log. Take your medicines as prescribed Eat low salt foods--Limit salt (sodium) to 2000 mg per day.  Stay as active as you can everyday Limit all fluids for the day to less than 2 liters  At the [facility], you and your health needs are our priority. As part of our continuing mission to provide you with exceptional heart care, we have created designated Provider Care Teams. These Care Teams include your primary Cardiologist (physician) and Advanced Practice Providers (APPs- Physician Assistants and Nurse Practitioners) who all work together to provide you with the care you need, when you need it.   You may see any of the following providers on your designated Care Team at your next follow up: Dr Glori Bickers Dr Loralie Champagne Dr. Roxana Hires, NP Lyda Jester, [place] [facility] [place], [place] Forestine Na, NP Audry Riles, PharmD   Please be sure to bring in all your medications bottles to every appointment.    Thank you for choosing [facility]

## 2022-08-02 ENCOUNTER — Other Ambulatory Visit (HOSPITAL_COMMUNITY): Payer: Self-pay | Admitting: Internal Medicine

## 2022-08-04 ENCOUNTER — Other Ambulatory Visit (HOSPITAL_COMMUNITY): Payer: Medicare Other

## 2022-08-05 ENCOUNTER — Ambulatory Visit (HOSPITAL_COMMUNITY)
Admission: RE | Admit: 2022-08-05 | Discharge: 2022-08-05 | Disposition: A | Discharge status: Home / Self Care | Payer: Medicare Other | Source: Ambulatory Visit | Attending: Cardiology | Admitting: Cardiology

## 2022-08-05 DIAGNOSIS — I2510 Atherosclerotic heart disease of native coronary artery without angina pectoris: Secondary | ICD-10-CM | POA: Insufficient documentation

## 2022-08-05 LAB — BASIC METABOLIC PANEL
Anion gap: 13 (ref 5–15)
BUN: 57 mg/dL — ABNORMAL HIGH (ref 8–23)
CO2: 25 mmol/L (ref 22–32)
Calcium: 9.6 mg/dL (ref 8.9–10.3)
Chloride: 97 mmol/L — ABNORMAL LOW (ref 98–111)
Creatinine, Ser: 2.88 mg/dL — ABNORMAL HIGH (ref 0.61–1.24)
GFR, Estimated: 23 mL/min — ABNORMAL LOW (ref >60)
Glucose, Bld: 251 mg/dL — ABNORMAL HIGH (ref 70–99)
Potassium: 4.7 mmol/L (ref 3.5–5.1)
Sodium: 135 mmol/L (ref 135–145)

## 2022-08-07 ENCOUNTER — Telehealth (HOSPITAL_COMMUNITY): Payer: Self-pay | Admitting: Cardiology

## 2022-08-07 MED ORDER — LOSARTAN POTASSIUM 25 MG PO TABS: 12.5000 mg | ORAL_TABLET | Freq: Every day | ORAL | 6 refills | Status: DC

## 2022-08-07 NOTE — Telephone Encounter (Signed)
Patient called.  Patient aware.   Pt daughter will return call to arrange labs

## 2022-08-07 NOTE — Telephone Encounter (Signed)
-----   Message from Jacklynn Ganong, [place] sent at 08/05/2022  4:55 PM EDT ----- Kidney function significant elevated. Stop lasix and losartan x 2 days, then resume Lasix at 20 mg daily, and lower dose of losartan at 12.5 mg daily.  Repeat BMET in 1 week

## 2022-08-11 ENCOUNTER — Other Ambulatory Visit (HOSPITAL_COMMUNITY): Payer: Self-pay | Admitting: Cardiology

## 2022-08-15 ENCOUNTER — Telehealth (HOSPITAL_COMMUNITY): Payer: Self-pay

## 2022-08-15 ENCOUNTER — Other Ambulatory Visit (HOSPITAL_COMMUNITY): Payer: Self-pay

## 2022-08-15 ENCOUNTER — Ambulatory Visit (HOSPITAL_COMMUNITY)
Admission: RE | Admit: 2022-08-15 | Discharge: 2022-08-15 | Disposition: A | Discharge status: Home / Self Care | Payer: Medicare Other | Source: Ambulatory Visit | Attending: Internal Medicine | Admitting: Internal Medicine

## 2022-08-15 ENCOUNTER — Other Ambulatory Visit (HOSPITAL_COMMUNITY): Payer: Self-pay | Admitting: Internal Medicine

## 2022-08-15 DIAGNOSIS — I5020 Unspecified systolic (congestive) heart failure: Secondary | ICD-10-CM | POA: Insufficient documentation

## 2022-08-15 LAB — BASIC METABOLIC PANEL
Anion gap: 9 (ref 5–15)
BUN: 61 mg/dL — ABNORMAL HIGH (ref 8–23)
CO2: 20 mmol/L — ABNORMAL LOW (ref 22–32)
Calcium: 9.6 mg/dL (ref 8.9–10.3)
Chloride: 103 mmol/L (ref 98–111)
Creatinine, Ser: 2.39 mg/dL — ABNORMAL HIGH (ref 0.61–1.24)
GFR, Estimated: 29 mL/min — ABNORMAL LOW (ref >60)
Glucose, Bld: 394 mg/dL — ABNORMAL HIGH (ref 70–99)
Potassium: 5.7 mmol/L — ABNORMAL HIGH (ref 3.5–5.1)
Sodium: 132 mmol/L — ABNORMAL LOW (ref 135–145)

## 2022-08-15 MED ORDER — LOKELMA 10 G PO PACK: 10.0000 g | PACK | Freq: Every day | ORAL | 1 refills | Status: DC

## 2022-08-15 NOTE — Telephone Encounter (Signed)
Spoke to his son and gave information. He will get to his father. Meds sent to pharmacy

## 2022-08-18 NOTE — Telephone Encounter (Signed)
error 

## 2022-08-20 ENCOUNTER — Other Ambulatory Visit (HOSPITAL_COMMUNITY): Payer: Self-pay

## 2022-08-20 ENCOUNTER — Telehealth (HOSPITAL_COMMUNITY): Payer: Self-pay

## 2022-08-20 DIAGNOSIS — I5020 Unspecified systolic (congestive) heart failure: Secondary | ICD-10-CM

## 2022-08-20 NOTE — Telephone Encounter (Signed)
Antonio Cervantes - daughter called and stated that [name] did not stop any supplements until yesterday and has not taken the [facility] because the pharmacy was out. I set him up for labs tomorrow so we can re-evaluate what he needs to do.

## 2022-08-21 ENCOUNTER — Ambulatory Visit (HOSPITAL_COMMUNITY)
Admission: RE | Admit: 2022-08-21 | Discharge: 2022-08-21 | Disposition: A | Discharge status: Home / Self Care | Payer: Medicare Other | Source: Ambulatory Visit | Attending: Cardiology | Admitting: Cardiology

## 2022-08-21 DIAGNOSIS — I5020 Unspecified systolic (congestive) heart failure: Secondary | ICD-10-CM

## 2022-08-21 LAB — BASIC METABOLIC PANEL
Anion gap: 12 (ref 5–15)
BUN: 48 mg/dL — ABNORMAL HIGH (ref 8–23)
CO2: 16 mmol/L — ABNORMAL LOW (ref 22–32)
Calcium: 9.2 mg/dL (ref 8.9–10.3)
Chloride: 108 mmol/L (ref 98–111)
Creatinine, Ser: 2.13 mg/dL — ABNORMAL HIGH (ref 0.61–1.24)
GFR, Estimated: 33 mL/min — ABNORMAL LOW (ref >60)
Glucose, Bld: 240 mg/dL — ABNORMAL HIGH (ref 70–99)
Potassium: 5.6 mmol/L — ABNORMAL HIGH (ref 3.5–5.1)
Sodium: 136 mmol/L (ref 135–145)

## 2022-08-22 ENCOUNTER — Telehealth (HOSPITAL_COMMUNITY): Payer: Self-pay

## 2022-08-22 NOTE — Telephone Encounter (Signed)
Called daughter to go over lab results. No answer and mail box is full.

## 2022-08-25 ENCOUNTER — Telehealth (HOSPITAL_COMMUNITY): Payer: Self-pay

## 2022-08-25 DIAGNOSIS — I5022 Chronic systolic (congestive) heart failure: Secondary | ICD-10-CM

## 2022-08-25 NOTE — Telephone Encounter (Signed)
Patients daughter in law [name] advised and verbalized understanding,lab appointment scheduled,lab orders entered. She never gave the patient the dose of lokelma from the week prior, instructed her to give him that at this time.   Orders Placed This Encounter  Procedures   Basic metabolic panel    Standing Status:   Future    Standing Expiration Date:   08/25/2023    Order Specific Question:   Release to patient    Answer:   Immediate    Order Specific Question:   Release to patient    Answer:   Immediate [1]

## 2022-08-27 ENCOUNTER — Other Ambulatory Visit (HOSPITAL_COMMUNITY): Payer: Medicare Other

## 2022-08-29 ENCOUNTER — Ambulatory Visit (HOSPITAL_COMMUNITY)
Admission: RE | Admit: 2022-08-29 | Discharge: 2022-08-29 | Disposition: A | Discharge status: Home / Self Care | Payer: Medicare Other | Source: Ambulatory Visit | Attending: Cardiology | Admitting: Cardiology

## 2022-08-29 DIAGNOSIS — I5022 Chronic systolic (congestive) heart failure: Secondary | ICD-10-CM | POA: Diagnosis not present

## 2022-08-29 LAB — BASIC METABOLIC PANEL
Anion gap: 11 (ref 5–15)
BUN: 31 mg/dL — ABNORMAL HIGH (ref 8–23)
CO2: 22 mmol/L (ref 22–32)
Calcium: 9.3 mg/dL (ref 8.9–10.3)
Chloride: 103 mmol/L (ref 98–111)
Creatinine, Ser: 2.24 mg/dL — ABNORMAL HIGH (ref 0.61–1.24)
GFR, Estimated: 31 mL/min — ABNORMAL LOW (ref >60)
Glucose, Bld: 224 mg/dL — ABNORMAL HIGH (ref 70–99)
Potassium: 4.4 mmol/L (ref 3.5–5.1)
Sodium: 136 mmol/L (ref 135–145)

## 2022-09-02 NOTE — Progress Notes (Signed)
ADVANCED HF CLINIC NOTE  PCP: Archie Endo, FNP Primary Cardiologist: Ellis Parents, FNP HF Cardiologist: Dr. Gala Romney  HPI: Antonio Cervantes 67 y.o. male w/ T2DM, remote tobacco use, systolic heart failure/iCM, CAD, AF, and HLD.   Admitted 1/23 for NSTEMI and found to be in acute CHF and afib w/ RVR, of unknown duration but spontaneously converted back to NSR. Echo with severely reduced LVEF, 20-25%, RV normal. LHC showed severe MVCAD, however given diffuse, distal vessel disease, there were no good options for revascularization (Diffuse inoperable CAD, targets not amendable to PCI). RHC showed normal filling pressures and normal CO  5.4 L/min, cardiac index 2.88. Medical therapy for CAD was recommended. Placed on ASA, statin and ? blocker. GDMT limited by soft BP and renal insuffiency, SCr ~1.5 during hospitalization (baseline unknown). Eliquis started for Afib. Referred to [facility] clinic.    Of note, there was also concern for potential aortic stenosis base on echo interpretation, "degree of AS hard to judge due to severe decrease in LV function DVI 0.55 AVA 2 cm2 but morphology and gradients suggest AS could be moderate" .   Seen in [facility] 2/23, stable NYHA I-II symptoms. GDMT titrated. Follow up in AHF 3/23 with stable NYHA II symptoms, euvolemic. Started on low-dose losartan.  Re-admitted 3/23 with NSTEMI. Underwent cMRI showing LVEF 47%, normal RVEF, viable multi-vessel disease. Moderate AI. CVTS consulted and underwent CABG x 4 and aortic valve replacement. Course complicated by persistent AF despite amiodarone and started on Coumadin (plan to continue for 3 months then switch to Eliquis). [name], beta blocker, and Lasix stopped with low BP and bradycardia. Discharged home, weight 163 lbs.  Echo 10/18/21 EF normalized, 55-60% G2DD AoV ok.   Follow up 1/24, doing well NYHA II.   Admitted 3/24 with a/c dHF. Diuresed with IV lasix, complicated by AKI on CKD. Had been taking NSAID for  gout. Echo showed EF 55-60%, RV ok. He was discharged home, weight 164 lbs.  Today he returns for HF follow up with his daughter-in-law and grand-daughter. Overall feeling fine. He has SOB going up and down steps but otherwise doing well. Denies palpitations, abnormal bleeding, CP, dizziness, edema, or PND/Orthopnea. Appetite ok. No fever or chills. Weight at home 165-167 pounds. Taking all medications. Daughter in law says Antonio Cervantes was replaced with glipizide.   Cardiac Testing  - Echo (3/24): EF 55-60%, RV ok, AoV ok  - Echo 6/23 EF 55-60%, GDDD, AoV ok   - cMRI (3/23): LVEF 47%, normal RVEF, mild to moderate AI, study suggests viable multi-vessel disease.  - Echo (3/23): EF 50%, mild LVH, RV ok, mild AI  - Echo (1/23): EF 20-25%, severe LV dysfunction, mild LVH, RV ok, mild MR, degree of AS hard to judge due to severe decrease in LV function DVI 0.55 AVA 2 cm2 but morphology and gradients suggest AS could be moderate .   - LHC/RHC (05/21/21):   Prox LAD to Mid LAD lesion is 75% stenosed.   Dist LAD lesion is 80% stenosed.   1st Diag lesion is 80% stenosed.   Ost RCA to Mid RCA lesion is 99% stenosed.   2nd Mrg lesion is 99% stenosed.   Dist Cx lesion is 90% stenosed.   Prox Cx to Mid Cx lesion is 75% stenosed.   LV end diastolic pressure is normal.   There is no aortic valve stenosis.   Severe, multivessel coronary artery disease.   Normal LVEDP. Given diffuse, distal vessel disease, there are no good  options for revascularization.   Right heart pressures: RA mean 2 PA pressure 26/10 mean 17 PCWP mean 6 CO/CI 5.4/2.88 (Fick)  ROS: All systems reviewed and negative except as per HPI.   Past Medical History:  Diagnosis Date   Diabetes mellitus without complication (HCC)    Diabetic peripheral neuropathy associated with type 2 diabetes mellitus (HCC)    Current Outpatient Medications  Medication Sig Dispense Refill   allopurinol (ZYLOPRIM) 100 MG tablet Take 200 mg by  mouth daily.     amoxicillin (AMOXIL) 500 MG tablet Take 4 tablets by mouth one hour prior to dental appointments 4 tablet 3   apixaban (ELIQUIS) 5 MG TABS tablet TAKE 1 TABLET BY MOUTH TWICE A DAY 30 tablet 11   aspirin 81 MG EC tablet Take 1 tablet (81 mg total) by mouth daily. Swallow whole. 30 tablet 11   atorvastatin (LIPITOR) 80 MG tablet Take 1 tablet (80 mg total) by mouth daily. 30 tablet 0   ezetimibe (ZETIA) 10 MG tablet Take 1 tablet (10 mg total) by mouth daily. 90 tablet 3   furosemide (LASIX) 20 MG tablet Take 20 mg by mouth daily.     glipiZIDE (GLUCOTROL) 5 MG tablet Take 5 mg by mouth daily.     levothyroxine (SYNTHROID) 112 MCG tablet Take 112 mcg by mouth daily.     metoprolol succinate (TOPROL-XL) 25 MG 24 hr tablet TAKE 1 TABLET (25 MG TOTAL) BY MOUTH DAILY. 90 tablet 3   predniSONE (DELTASONE) 10 MG tablet Take 3 tablets (30 mg total) by mouth daily. As needed for next Gout flare instead of Indomethacin 9 tablet 1   No current facility-administered medications for this encounter.   No Known Allergies  Social History   Socioeconomic History   Marital status: Married    Spouse name: Not on file   Number of children: 1   Years of education: Not on file   Highest education level: 10th grade  Occupational History   Occupation: retired  Tobacco Use   Smoking status: Former    Packs/day: 1.50    Years: 15.00    Additional pack years: 0.00    Total pack years: 22.50    Types: Cigarettes    Quit date: 1983    Years since quitting: 41.3   Smokeless tobacco: Never  Vaping Use   Vaping Use: Never used  Substance and Sexual Activity   Alcohol use: No   Drug use: No   Sexual activity: Not on file  Other Topics Concern   Not on file  Social History Narrative   Not on file   Social Determinants of Health   Financial Resource Strain: Low Risk  (05/21/2021)   Overall Financial Resource Strain (CARDIA)    Difficulty of Paying Living Expenses: Not very hard  Food  Insecurity: No Food Insecurity (07/17/2022)   Hunger Vital Sign    Worried About Running Out of Food in the Last Year: Never true    Ran Out of Food in the Last Year: Never true  Transportation Needs: No Transportation Needs (07/17/2022)   PRAPARE - Administrator, Civil Service (Medical): No    Lack of Transportation (Non-Medical): No  Physical Activity: Not on file  Stress: Not on file  Social Connections: Not on file  Intimate Partner Violence: Not At Risk (07/12/2022)   Humiliation, Afraid, Rape, and Kick questionnaire    Fear of Current or Ex-Partner: No    Emotionally Abused: No  Physically Abused: No    Sexually Abused: No   Family History  Problem Relation Age of Onset   Heart attack Maternal Uncle    Sudden Cardiac Death Neg Hx    Wt Readings from Last 3 Encounters:  09/05/22 75.3 kg (166 lb)  07/25/22 78.2 kg (172 lb 6.4 oz)  07/14/22 74.8 kg (164 lb 14.5 oz)   Pulse 69   Wt 75.3 kg (166 lb)   SpO2 98%   BMI 26.79 kg/m   PHYSICAL EXAM: General:  NAD. No resp difficulty, walked into clinic HEENT: Normal Neck: Supple. No JVD. Carotids 2+ bilat; no bruits. No lymphadenopathy or thryomegaly appreciated. Cor: PMI nondisplaced. Regular rate & rhythm. No rubs, gallops or murmurs. Lungs: Clear Abdomen: Soft, nontender, nondistended. No hepatosplenomegaly. No bruits or masses. Good bowel sounds. Extremities: No cyanosis, clubbing, rash, edema Neuro: Alert & oriented x 3, cranial nerves grossly intact. Moves all 4 extremities w/o difficulty. Affect pleasant.  ECG (personally reviewed): none ordered today.  ASSESSMENT & PLAN:  1. 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  - Echo 1/23 EF 20-25%, RV ok - LHC (1/23): w/ severe MVCAD. RHC w/ normal filling pressures and preserved CO 5.4 L/min, cardiac index 2.88 - Echo (3/23): improved EF 50%, mild LVH, RV ok, mild AI - cMRI (3/23): LVEF 47%, RVEF 48%, viable multi-vessel disease, moderate AI - s/p CABG  4/23 - Echo 6/23 EF  normalized, 55-60% G2DD Aov ok   - Echo (3/24): EF 55-60%, RV ok - NYHA II. Volume looks good today. GDMT limited by hyperkalemia and CKD - Restart Farxiga 10 mg daily. - Continue Lasix 20 mg daily. - Continue Toprol XL 25 mg daily. - No spiro with CKD and hyperkalemia. - Off losartan with worsening renal function and hyperkalemia (failed x 2). - Labs today.    2. CAD - NSTEMI 1/23, LHC w/ severe, diffuse MVCAD--> medically managed. - NSTEMI 3/23, s/p CABG x 4, LIMA to LAD, SVG to diagonal, SVG to OM, SVG to distal RCA and AVR - No chest pain  - Continue ASA 81 + atorva 80 daily + beta blocker. - LDL 53 (1/24)   3. PAF - Regular on exam today. - Continue Toprol. - Continue Eliquis 5 mg bid. Denies abnormal bleeding.   4. Aortic Stenosis  - s/p aortic valve replacement, stable on echo 6/23 & 3/24 - Aware of SBE prophylaxis   5. CKD IIIa - Baseline SCr ~1.5 - Restart SLGT2i - Labs today.   6. Type 2DM - Hgb A1c 7.6  - followed by PCP  - Restart SGLT2i   7. HLD  - Continue atorva 80 mg - LDL 53 (1/24), LDL goal < 70   8. Remote Tobacco Use - Quit 40 years ago.   Follow up in 6 months with Dr. Gala Romney.   Jacklynn Ganong, FNP  12:08 PM

## 2022-09-05 ENCOUNTER — Ambulatory Visit (HOSPITAL_COMMUNITY)
Admission: RE | Admit: 2022-09-05 | Discharge: 2022-09-05 | Disposition: A | Discharge status: Home / Self Care | Payer: Medicare Other | Source: Ambulatory Visit | Attending: Family Medicine | Admitting: Family Medicine

## 2022-09-05 ENCOUNTER — Encounter (HOSPITAL_COMMUNITY): Payer: Self-pay

## 2022-09-05 ENCOUNTER — Other Ambulatory Visit (HOSPITAL_COMMUNITY): Payer: Self-pay

## 2022-09-05 VITALS — BP 132/72 | HR 69 | Wt 166.0 lb

## 2022-09-05 DIAGNOSIS — M109 Gout, unspecified: Secondary | ICD-10-CM | POA: Diagnosis not present

## 2022-09-05 DIAGNOSIS — N183 Chronic kidney disease, stage 3 unspecified: Secondary | ICD-10-CM

## 2022-09-05 DIAGNOSIS — I350 Nonrheumatic aortic (valve) stenosis: Secondary | ICD-10-CM | POA: Insufficient documentation

## 2022-09-05 DIAGNOSIS — Z8249 Family history of ischemic heart disease and other diseases of the circulatory system: Secondary | ICD-10-CM | POA: Insufficient documentation

## 2022-09-05 DIAGNOSIS — N1831 Chronic kidney disease, stage 3a: Secondary | ICD-10-CM | POA: Insufficient documentation

## 2022-09-05 DIAGNOSIS — Z7901 Long term (current) use of anticoagulants: Secondary | ICD-10-CM | POA: Insufficient documentation

## 2022-09-05 DIAGNOSIS — I2510 Atherosclerotic heart disease of native coronary artery without angina pectoris: Secondary | ICD-10-CM | POA: Diagnosis not present

## 2022-09-05 DIAGNOSIS — E1122 Type 2 diabetes mellitus with diabetic chronic kidney disease: Secondary | ICD-10-CM | POA: Diagnosis not present

## 2022-09-05 DIAGNOSIS — Z87891 Personal history of nicotine dependence: Secondary | ICD-10-CM | POA: Diagnosis not present

## 2022-09-05 DIAGNOSIS — Z951 Presence of aortocoronary bypass graft: Secondary | ICD-10-CM | POA: Insufficient documentation

## 2022-09-05 DIAGNOSIS — Z79899 Other long term (current) drug therapy: Secondary | ICD-10-CM | POA: Diagnosis not present

## 2022-09-05 DIAGNOSIS — I5022 Chronic systolic (congestive) heart failure: Secondary | ICD-10-CM | POA: Diagnosis not present

## 2022-09-05 DIAGNOSIS — I480 Paroxysmal atrial fibrillation: Secondary | ICD-10-CM | POA: Diagnosis not present

## 2022-09-05 DIAGNOSIS — I255 Ischemic cardiomyopathy: Secondary | ICD-10-CM | POA: Insufficient documentation

## 2022-09-05 DIAGNOSIS — Z7984 Long term (current) use of oral hypoglycemic drugs: Secondary | ICD-10-CM | POA: Diagnosis not present

## 2022-09-05 DIAGNOSIS — Z952 Presence of prosthetic heart valve: Secondary | ICD-10-CM | POA: Diagnosis not present

## 2022-09-05 DIAGNOSIS — I4819 Other persistent atrial fibrillation: Secondary | ICD-10-CM | POA: Diagnosis not present

## 2022-09-05 DIAGNOSIS — I252 Old myocardial infarction: Secondary | ICD-10-CM | POA: Insufficient documentation

## 2022-09-05 DIAGNOSIS — E785 Hyperlipidemia, unspecified: Secondary | ICD-10-CM

## 2022-09-05 LAB — BASIC METABOLIC PANEL
Anion gap: 8 (ref 5–15)
BUN: 53 mg/dL — ABNORMAL HIGH (ref 8–23)
CO2: 20 mmol/L — ABNORMAL LOW (ref 22–32)
Calcium: 8.9 mg/dL (ref 8.9–10.3)
Chloride: 103 mmol/L (ref 98–111)
Creatinine, Ser: 2.15 mg/dL — ABNORMAL HIGH (ref 0.61–1.24)
GFR, Estimated: 33 mL/min — ABNORMAL LOW (ref >60)
Glucose, Bld: 315 mg/dL — ABNORMAL HIGH (ref 70–99)
Potassium: 4.7 mmol/L (ref 3.5–5.1)
Sodium: 131 mmol/L — ABNORMAL LOW (ref 135–145)

## 2022-09-05 MED ORDER — DAPAGLIFLOZIN PROPANEDIOL 10 MG PO TABS: 10.0000 mg | ORAL_TABLET | Freq: Every day | ORAL | 3 refills | Status: AC

## 2022-09-05 MED ORDER — METOPROLOL SUCCINATE ER 25 MG PO TB24: 25.0000 mg | ORAL_TABLET | Freq: Every day | ORAL | 3 refills | Status: DC

## 2022-09-05 NOTE — Patient Instructions (Addendum)
Thank you for coming in today  If you had labs drawn today, any labs that are abnormal the clinic will call you No news is good news  Medications: START Farxiga 10 mg 1 tablet daily   Follow up appointments:  Your physician recommends that you schedule a follow-up appointment in:  Follow up with Dr Gala Romney in September 2024  You will receive a reminder letter in the mail a few months in advance. If you don't receive a letter, please call our office to schedule the follow-up appointment.     Do the following things EVERYDAY: Weigh yourself in the morning before breakfast. Write it down and keep it in a log. Take your medicines as prescribed Eat low salt foods--Limit salt (sodium) to 2000 mg per day.  Stay as active as you can everyday Limit all fluids for the day to less than 2 liters   At the [facility], you and your health needs are our priority. As part of our continuing mission to provide you with exceptional heart care, we have created designated Provider Care Teams. These Care Teams include your primary Cardiologist (physician) and Advanced Practice Providers (APPs- Physician Assistants and Nurse Practitioners) who all work together to provide you with the care you need, when you need it.   You may see any of the following providers on your designated Care Team at your next follow up: Dr Arvilla Meres Dr Marca Ancona Dr. Marcos Eke, NP Robbie Lis, [place] [facility] [place], [place] Brynda Peon, NP Karle Plumber, PharmD   Please be sure to bring in all your medications bottles to every appointment.    Thank you for choosing [facility]  If you have any questions or concerns before your next appointment please send [place] a message through [place] or call our office at [phone].    TO LEAVE A MESSAGE FOR THE NURSE SELECT OPTION 2, PLEASE LEAVE A MESSAGE INCLUDING: YOUR NAME DATE  OF BIRTH CALL BACK NUMBER REASON FOR CALL**this is important as we prioritize the call backs  YOU WILL RECEIVE A CALL BACK THE SAME DAY AS LONG AS YOU CALL BEFORE 4:00 PM

## 2022-09-23 ENCOUNTER — Other Ambulatory Visit: Payer: Self-pay

## 2022-09-23 ENCOUNTER — Telehealth: Payer: Self-pay

## 2022-09-23 ENCOUNTER — Encounter (HOSPITAL_COMMUNITY): Payer: Self-pay

## 2022-09-23 ENCOUNTER — Emergency Department (HOSPITAL_COMMUNITY): Payer: Medicare Other

## 2022-09-23 ENCOUNTER — Emergency Department (HOSPITAL_COMMUNITY)
Admission: EM | Admit: 2022-09-23 | Discharge: 2022-09-23 | Disposition: A | Discharge status: Home / Self Care | Payer: Medicare Other | Attending: Emergency Medicine | Admitting: Emergency Medicine

## 2022-09-23 DIAGNOSIS — N189 Chronic kidney disease, unspecified: Secondary | ICD-10-CM | POA: Insufficient documentation

## 2022-09-23 DIAGNOSIS — Z743 Need for continuous supervision: Secondary | ICD-10-CM | POA: Diagnosis not present

## 2022-09-23 DIAGNOSIS — Z7982 Long term (current) use of aspirin: Secondary | ICD-10-CM | POA: Insufficient documentation

## 2022-09-23 DIAGNOSIS — I2510 Atherosclerotic heart disease of native coronary artery without angina pectoris: Secondary | ICD-10-CM | POA: Insufficient documentation

## 2022-09-23 DIAGNOSIS — R0789 Other chest pain: Secondary | ICD-10-CM | POA: Diagnosis not present

## 2022-09-23 DIAGNOSIS — Z79899 Other long term (current) drug therapy: Secondary | ICD-10-CM | POA: Insufficient documentation

## 2022-09-23 DIAGNOSIS — R079 Chest pain, unspecified: Secondary | ICD-10-CM | POA: Diagnosis not present

## 2022-09-23 DIAGNOSIS — R5383 Other fatigue: Secondary | ICD-10-CM | POA: Insufficient documentation

## 2022-09-23 DIAGNOSIS — Z951 Presence of aortocoronary bypass graft: Secondary | ICD-10-CM | POA: Diagnosis not present

## 2022-09-23 DIAGNOSIS — E1122 Type 2 diabetes mellitus with diabetic chronic kidney disease: Secondary | ICD-10-CM | POA: Diagnosis not present

## 2022-09-23 DIAGNOSIS — R069 Unspecified abnormalities of breathing: Secondary | ICD-10-CM | POA: Diagnosis not present

## 2022-09-23 DIAGNOSIS — Z7984 Long term (current) use of oral hypoglycemic drugs: Secondary | ICD-10-CM | POA: Insufficient documentation

## 2022-09-23 DIAGNOSIS — R531 Weakness: Secondary | ICD-10-CM | POA: Diagnosis not present

## 2022-09-23 DIAGNOSIS — I509 Heart failure, unspecified: Secondary | ICD-10-CM | POA: Diagnosis not present

## 2022-09-23 DIAGNOSIS — R0602 Shortness of breath: Secondary | ICD-10-CM | POA: Insufficient documentation

## 2022-09-23 DIAGNOSIS — I130 Hypertensive heart and chronic kidney disease with heart failure and stage 1 through stage 4 chronic kidney disease, or unspecified chronic kidney disease: Secondary | ICD-10-CM | POA: Diagnosis not present

## 2022-09-23 DIAGNOSIS — R059 Cough, unspecified: Secondary | ICD-10-CM | POA: Diagnosis not present

## 2022-09-23 LAB — CBC
HCT: 35.1 % — ABNORMAL LOW (ref 39.0–52.0)
Hemoglobin: 11.6 g/dL — ABNORMAL LOW (ref 13.0–17.0)
MCH: 30.9 pg (ref 26.0–34.0)
MCHC: 33 g/dL (ref 30.0–36.0)
MCV: 93.4 fL (ref 80.0–100.0)
Platelets: 244 10*3/uL (ref 150–400)
RBC: 3.76 MIL/uL — ABNORMAL LOW (ref 4.22–5.81)
RDW: 14.1 % (ref 11.5–15.5)
WBC: 10.5 10*3/uL (ref 4.0–10.5)
nRBC: 0 % (ref 0.0–0.2)

## 2022-09-23 LAB — I-STAT CHEM 8, ED
BUN: 39 mg/dL — ABNORMAL HIGH (ref 8–23)
Calcium, Ion: 1.26 mmol/L (ref 1.15–1.40)
Chloride: 105 mmol/L (ref 98–111)
Creatinine, Ser: 2.2 mg/dL — ABNORMAL HIGH (ref 0.61–1.24)
Glucose, Bld: 158 mg/dL — ABNORMAL HIGH (ref 70–99)
HCT: 36 % — ABNORMAL LOW (ref 39.0–52.0)
Hemoglobin: 12.2 g/dL — ABNORMAL LOW (ref 13.0–17.0)
Potassium: 4.8 mmol/L (ref 3.5–5.1)
Sodium: 138 mmol/L (ref 135–145)
TCO2: 22 mmol/L (ref 22–32)

## 2022-09-23 LAB — BASIC METABOLIC PANEL
Anion gap: 15 (ref 5–15)
BUN: 40 mg/dL — ABNORMAL HIGH (ref 8–23)
CO2: 19 mmol/L — ABNORMAL LOW (ref 22–32)
Calcium: 9.4 mg/dL (ref 8.9–10.3)
Chloride: 102 mmol/L (ref 98–111)
Creatinine, Ser: 2.16 mg/dL — ABNORMAL HIGH (ref 0.61–1.24)
GFR, Estimated: 33 mL/min — ABNORMAL LOW (ref >60)
Glucose, Bld: 154 mg/dL — ABNORMAL HIGH (ref 70–99)
Potassium: 4.8 mmol/L (ref 3.5–5.1)
Sodium: 136 mmol/L (ref 135–145)

## 2022-09-23 LAB — TROPONIN I (HIGH SENSITIVITY)
Troponin I (High Sensitivity): 15 ng/L (ref <18)
Troponin I (High Sensitivity): 11 ng/L (ref <18)

## 2022-09-23 NOTE — ED Provider Triage Note (Signed)
Emergency Medicine Provider Triage Evaluation Note  Antonio Cervantes , a 67 y.o. male  was evaluated in triage.  Pt complains of chest pain.  He reports that he woke up this morning and began experiencing chest pain after having a brief episode of chest pain last night when he was walking down the stairs at home.  Currently endorses feeling of a ball in his throat and some slight shortness of breath.  Patient previously had coronary bypass surgery in April 2023.  On aspirin 81 mg.  Denies nausea, vomiting, diaphoresis.  Review of Systems  Positive: As above Negative: As above  Physical Exam  BP 104/89   Pulse 75   Temp 98.2 F (36.8 C) (Oral)   Resp 17   Ht 5\' 6"  (1.676 m)   Wt 74.8 kg   SpO2 98%   BMI 26.63 kg/m  Gen:   Awake, no distress   Resp:  Normal effort, no audible wheezing or decreased lung sounds MSK:   Moves extremities without difficulty  Other:  No tenderness to palpation of chest.  Cardiac rhythm sounds regular.  Medical Decision Making  Medically screening exam initiated at 3:22 PM.  Appropriate orders placed.  Antonio Cervantes was informed that the remainder of the evaluation will be completed by another provider, this initial triage assessment does not replace that evaluation, and the importance of remaining in the ED until their evaluation is complete.    Smitty Knudsen, PA-C 09/23/22 1524

## 2022-09-23 NOTE — ED Triage Notes (Signed)
Pt arrives via EMS from home. Pt reports sob,  chest pain, and a non productive cough since last night. Pt was given 324mg  of asa. Pt AxOx4. Pt states they took him off of his eliquis recently.

## 2022-09-23 NOTE — ED Provider Notes (Signed)
[place] EMERGENCY DEPARTMENT AT [facility] Provider Note   CSN: [id number] Arrival date & time: 09/23/22  1503     History  Chief Complaint  Patient presents with   Chest Pain   Shortness of Breath    Antonio Cervantes is a 67 y.o. male with past medical history significant for diabetes, CKD, CHF, CAD status post four-vessel CABG last year, ischemic cardiomyopathy who presents with concern for shortness of breath, chest pain, nonproductive cough since last night.  Patient had 324 mg of aspirin given prior to arrival.  He was recently taken off his Eliquis.  At time my evaluation he reports chest pain is around a 1 out of 10.  Son reports that he has had more fatigue, weakness recently.  He reports the chest pain feels like pressure/tightness.  He reports sometimes worse with exertion.  No previous history of blood clot.  He denies any hemoptysis.   Chest Pain Associated symptoms: shortness of breath   Shortness of Breath Associated symptoms: chest pain        Home Medications Prior to Admission medications   Medication Sig Start Date End Date Taking? Authorizing Provider  allopurinol (ZYLOPRIM) 100 MG tablet Take 200 mg by mouth daily. Patient not taking: Reported on 09/05/2022    [provider]  amoxicillin (AMOXIL) 500 MG tablet Take 4 tablets by mouth one hour prior to dental appointments 07/02/22   Corky Crafts, MD  apixaban (ELIQUIS) 5 MG TABS tablet TAKE 1 TABLET BY MOUTH TWICE A DAY 08/04/22   Bensimhon, Bevelyn Buckles, MD  aspirin 81 MG EC tablet Take 1 tablet (81 mg total) by mouth daily. Swallow whole. 05/23/21   Arty Baumgartner, NP  atorvastatin (LIPITOR) 80 MG tablet Take 1 tablet (80 mg total) by mouth daily. 05/24/21   Burnadette Pop, MD  dapagliflozin propanediol (FARXIGA) 10 MG TABS tablet Take 1 tablet (10 mg total) by mouth daily before breakfast. 09/05/22   Milford, Anderson Malta, FNP  ezetimibe (ZETIA) 10 MG tablet Take 1 tablet (10 mg total)  by mouth daily. 04/02/22   Dyann Kief, PA-C  furosemide (LASIX) 20 MG tablet Take 20 mg by mouth daily.    [provider]  glipiZIDE (GLUCOTROL) 5 MG tablet Take 5 mg by mouth daily. 09/01/22   [provider]  levothyroxine (SYNTHROID) 112 MCG tablet Take 112 mcg by mouth daily. 02/22/22   [provider]  metoprolol succinate (TOPROL-XL) 25 MG 24 hr tablet Take 1 tablet (25 mg total) by mouth daily. 09/05/22   Milford, Anderson Malta, FNP  predniSONE (DELTASONE) 10 MG tablet Take 3 tablets (30 mg total) by mouth daily. As needed for next Gout flare instead of Indomethacin 07/14/22   Zannie Cove, MD      Allergies    Patient has no known allergies.    Review of Systems   Review of Systems  Respiratory:  Positive for shortness of breath.   Cardiovascular:  Positive for chest pain.  All other systems reviewed and are negative.   Physical Exam Updated Vital Signs BP 124/85   Pulse (!) 56   Temp 98.2 F (36.8 C) (Oral)   Resp 16   Ht 5\' 6"  (1.676 m)   Wt 74.8 kg   SpO2 100%   BMI 26.63 kg/m  Physical Exam Vitals and nursing note reviewed.  Constitutional:      General: He is not in acute distress.    Appearance: Normal appearance.  HENT:     Head: Normocephalic and atraumatic.  Eyes:     General:        Right eye: No discharge.        Left eye: No discharge.  Cardiovascular:     Rate and Rhythm: Normal rate and regular rhythm.     Heart sounds: No murmur heard.    No friction rub. No gallop.  Pulmonary:     Effort: Pulmonary effort is normal.     Breath sounds: Normal breath sounds.     Comments: No wheezing, rhonchi, stridor, rales.  No focal consolidation, no respiratory distress. Chest:     Comments: No tenderness to palpation of the chest wall Abdominal:     General: Bowel sounds are normal.     Palpations: Abdomen is soft.  Skin:    General: Skin is warm and dry.     Capillary Refill: Capillary refill takes less than 2 seconds.   Neurological:     Mental Status: He is alert and oriented to person, place, and time.  Psychiatric:        Mood and Affect: Mood normal.        Behavior: Behavior normal.     ED Results / Procedures / Treatments   Labs (all labs ordered are listed, but only abnormal results are displayed) Labs Reviewed  BASIC METABOLIC PANEL - Abnormal; Notable for the following components:      Result Value   CO2 19 (*)    Glucose, Bld 154 (*)    BUN 40 (*)    Creatinine, Ser 2.16 (*)    GFR, Estimated 33 (*)    All other components within normal limits  CBC - Abnormal; Notable for the following components:   RBC 3.76 (*)    Hemoglobin 11.6 (*)    HCT 35.1 (*)    All other components within normal limits  I-STAT CHEM 8, ED - Abnormal; Notable for the following components:   BUN 39 (*)    Creatinine, Ser 2.20 (*)    Glucose, Bld 158 (*)    Hemoglobin 12.2 (*)    HCT 36.0 (*)    All other components within normal limits  TROPONIN I (HIGH SENSITIVITY)  TROPONIN I (HIGH SENSITIVITY)    EKG EKG Interpretation  Date/Time:  Tuesday Sep 23 2022 15:07:43 EDT Ventricular Rate:  71 PR Interval:  184 QRS Duration: 86 QT Interval:  380 QTC Calculation: 412 R Axis:   -3 Text Interpretation: Normal sinus rhythm Minimal voltage criteria for LVH, may be normal variant ( R in aVL ) Nonspecific ST and T wave abnormality Abnormal ECG When compared with ECG of 25-Jul-2022 10:11, PREVIOUS ECG IS PRESENT similar to prior no stemi Confirmed by Tanda Rockers ([id number]) on 09/23/2022 5:53:12 PM  Radiology DG Chest 2 View  Result Date: 09/23/2022 CLINICAL DATA:  Chest pain EXAM: CHEST - 2 VIEW COMPARISON:  Chest radiograph dated 07/11/2022 FINDINGS: Normal lung volumes. No focal consolidations. No pleural effusion or pneumothorax. Similar cardiomediastinal silhouette. Median sternotomy wires are nondisplaced. IMPRESSION: No active cardiopulmonary disease. Electronically Signed   By: Agustin Cree M.D.   On:  09/23/2022 16:27    Procedures Procedures    Medications Ordered in ED Medications - No data to display  ED Course/ Medical Decision Making/ A&P                             Medical Decision Making Amount  and/or Complexity of Data Reviewed Labs: ordered. Radiology: ordered.   This patient is a 67 y.o. male  who presents to the ED for concern of chest pain, shob.   Differential diagnoses prior to evaluation: The emergent differential diagnosis includes, but is not limited to,  ACS, AAS, PE, Mallory-Weiss, Boerhaave's, Pneumonia, acute bronchitis, asthma or COPD exacerbation, anxiety, MSK pain or traumatic injury to the chest, acid reflux versus other . This is not an exhaustive differential.   Past Medical History / Co-morbidities / Social History: diabetes, CKD, CHF, CAD status post four-vessel CABG last year, ischemic cardiomyopathy  Additional history: Chart reviewed. Pertinent results include: Reviewed an echo performed earlier this year patient with 55 to 60% left ventricular ejection fraction, with overall normal valves, aortic valve status post replacement  Physical Exam: Physical exam performed. The pertinent findings include: Patient with stable vital signs, no acute distress in the emergency department, stable oxygen saturation on room air, normal respiratory rate, no accessory breath sounds on exam.  Lab Tests/Imaging studies: I personally interpreted labs/imaging and the pertinent results include: BMP notable for mild bicarb deficit, CO2 19 but without anion gap, BUN, creatinine are elevated but close to his baseline.  CBC overall unremarkable, mild anemia, hemoglobin 11.6.  His troponin is normal x 2, initially 15, and delta at 11 and his chest pain is resolved at time of reevaluation..  In interpreted plain film chest x-ray shows no evidence of acute intrathoracic abnormality.  I agree with the radiologist interpretation.  Cardiac monitoring: EKG obtained and  interpreted by my attending physician which shows: Normal sinus rhythm, similar to baseline, no STEMI   Disposition: After consideration of the diagnostic results and the patients response to treatment, I feel that patient does have high cardiac risk factors but with chest pain resolved, normal delta troponins, and established cardiologist I think that he is stable for follow-up with his cardiologist at this time.  We performed shared decision making about a admission for monitoring, serial troponins, and close follow-up but after discussion he will follow-up with cardiologist outpatient.   emergency department workup does not suggest an emergent condition requiring admission or immediate intervention beyond what has been performed at this time. The plan is: as above. The patient is safe for discharge and has been instructed to return immediately for worsening symptoms, change in symptoms or any other concerns.  Final Clinical Impression(s) / ED Diagnoses Final diagnoses:  Chest pain, unspecified type    Rx / DC Orders ED Discharge Orders     None         Olene Floss, PA-C 09/23/22 1927    Sloan Leiter, DO 09/24/22 0012

## 2022-09-23 NOTE — Patient Outreach (Signed)
  Care Coordination   Initial Visit Note   09/23/2022 Name: Antonio Cervantes MRN: [id number] DOB: 03-Mar-1956  Antonio Cervantes is a 67 y.o. year old male who sees Antonio Parents, FNP for primary care. I spoke with  Antonio Cervantes by phone today. Patient request I speak to daughter Antonio Cervantes.   What matters to the patients health and wellness today?  Placed call to patient to review and offer [facility] care coordination program.  Patient request I speak with daughter. Actually spoke with patient and daughter in law on speaker phone. Patient reports that he has been having chest pain since last night and feels like he has a lump in his throat. States shortness of breath.   Daughter denies any swelling in legs.  Reports BP of 145/66,  HR 66 weight 155 pounds.     SDOH assessments and interventions completed:  No     Care Coordination Interventions:  Yes, provided    Interventions Today    Flowsheet Row Most Recent Value  Chronic Disease   Chronic disease during today's visit Other  [chest pain/shortness of breath]  Education Interventions   Education Provided Provided Education  [Encouraged daughter to call 911.]       Follow up plan: Follow up call scheduled for 1 week    Encounter Outcome:  Pt. Visit Completed   Antonio Pavy, RN, BSN, CEN [facility] Coordinator [phone]

## 2022-09-23 NOTE — Discharge Instructions (Addendum)
I recommend close follow-up with your cardiologist, please give them a call and schedule appointment within the next 2 weeks.  Today you do not show any signs of fluid overload, damage to the heart wall, and your other lab work is reassuring or stable compared to your baseline.  Please continue take your home blood pressure medications and I recommend that you return to the emergency department if you have worsening chest pain, worsening shortness of breath despite treatment.

## 2022-09-24 DIAGNOSIS — I10 Essential (primary) hypertension: Secondary | ICD-10-CM | POA: Diagnosis not present

## 2022-09-24 DIAGNOSIS — E782 Mixed hyperlipidemia: Secondary | ICD-10-CM | POA: Diagnosis not present

## 2022-09-24 DIAGNOSIS — R09A2 Foreign body sensation, throat: Secondary | ICD-10-CM | POA: Diagnosis not present

## 2022-09-24 DIAGNOSIS — W57XXXS Bitten or stung by nonvenomous insect and other nonvenomous arthropods, sequela: Secondary | ICD-10-CM | POA: Diagnosis not present

## 2022-09-24 DIAGNOSIS — M109 Gout, unspecified: Secondary | ICD-10-CM | POA: Diagnosis not present

## 2022-09-24 DIAGNOSIS — Z139 Encounter for screening, unspecified: Secondary | ICD-10-CM | POA: Diagnosis not present

## 2022-09-24 DIAGNOSIS — N182 Chronic kidney disease, stage 2 (mild): Secondary | ICD-10-CM | POA: Diagnosis not present

## 2022-09-24 DIAGNOSIS — E1122 Type 2 diabetes mellitus with diabetic chronic kidney disease: Secondary | ICD-10-CM | POA: Diagnosis not present

## 2022-09-29 ENCOUNTER — Telehealth: Payer: Self-pay | Admitting: *Deleted

## 2022-09-29 NOTE — Telephone Encounter (Signed)
Transition Care Management Unsuccessful Follow-up Telephone Call  Date of discharge and from where:  [facility]  ed 09/23/2022  Attempts:  1st Attempt  Reason for unsuccessful TCM follow-up call:  No answer/busy

## 2022-09-30 ENCOUNTER — Encounter: Payer: Self-pay | Admitting: Physician Assistant

## 2022-10-07 DIAGNOSIS — E113413 Type 2 diabetes mellitus with severe nonproliferative diabetic retinopathy with macular edema, bilateral: Secondary | ICD-10-CM | POA: Diagnosis not present

## 2022-10-07 DIAGNOSIS — H35033 Hypertensive retinopathy, bilateral: Secondary | ICD-10-CM | POA: Diagnosis not present

## 2022-10-07 DIAGNOSIS — H43823 Vitreomacular adhesion, bilateral: Secondary | ICD-10-CM | POA: Diagnosis not present

## 2022-12-03 DIAGNOSIS — I480 Paroxysmal atrial fibrillation: Secondary | ICD-10-CM | POA: Diagnosis not present

## 2022-12-03 DIAGNOSIS — N179 Acute kidney failure, unspecified: Secondary | ICD-10-CM | POA: Diagnosis not present

## 2022-12-03 DIAGNOSIS — I5032 Chronic diastolic (congestive) heart failure: Secondary | ICD-10-CM | POA: Diagnosis not present

## 2022-12-03 DIAGNOSIS — Z950 Presence of cardiac pacemaker: Secondary | ICD-10-CM | POA: Diagnosis not present

## 2022-12-03 DIAGNOSIS — N1832 Chronic kidney disease, stage 3b: Secondary | ICD-10-CM | POA: Diagnosis not present

## 2022-12-03 DIAGNOSIS — N2581 Secondary hyperparathyroidism of renal origin: Secondary | ICD-10-CM | POA: Diagnosis not present

## 2022-12-03 DIAGNOSIS — E1122 Type 2 diabetes mellitus with diabetic chronic kidney disease: Secondary | ICD-10-CM | POA: Diagnosis not present

## 2022-12-03 DIAGNOSIS — M109 Gout, unspecified: Secondary | ICD-10-CM | POA: Diagnosis not present

## 2022-12-03 DIAGNOSIS — I129 Hypertensive chronic kidney disease with stage 1 through stage 4 chronic kidney disease, or unspecified chronic kidney disease: Secondary | ICD-10-CM | POA: Diagnosis not present

## 2022-12-03 DIAGNOSIS — E1129 Type 2 diabetes mellitus with other diabetic kidney complication: Secondary | ICD-10-CM | POA: Diagnosis not present

## 2022-12-15 ENCOUNTER — Telehealth: Payer: Self-pay | Admitting: Interventional Cardiology

## 2022-12-15 NOTE — Telephone Encounter (Signed)
Pt c/o swelling: STAT is pt has developed SOB within 24 hours  How much weight have you gained and in what time span?  No weight gain  If swelling, where is the swelling located?  No swelling but patient's daughter is law states the patient has been coughing since yesterday + congestion. She assumes he may have excess fluid in his lungs and she would like advisement  Are you currently taking a fluid pill?  furosemide (LASIX) 20 MG tablet--patient's daughter in law states the patient may need to take an extra pill  Are you currently SOB?   Do you have a log of your daily weights (if so, list)?  Weight remains at 166 lbs  Have you gained 3 pounds in a day or 5 pounds in a week?   Have you traveled recently?  No

## 2022-12-15 NOTE — Telephone Encounter (Signed)
I spoke with [name]. She reports patient has a dry cough, no weight gain, no swelling.  More short of breath since yesterday.  Has taken lasix already today.  History of renal issues also.  Feels like he is filling up with fluid. Patient is followed by CHF clinic.  I told [name] I would send message to CHF clinic.  Advised patient should go to ED if symptoms worsen

## 2022-12-16 NOTE — Telephone Encounter (Signed)
Pt aware with [name] (daughter) Follow up scheduled

## 2022-12-17 ENCOUNTER — Ambulatory Visit: Payer: Medicare Other | Admitting: Physician Assistant

## 2022-12-17 ENCOUNTER — Encounter: Payer: Self-pay | Admitting: Physician Assistant

## 2022-12-17 VITALS — BP 118/68 | Ht 66.0 in | Wt 170.0 lb

## 2022-12-17 DIAGNOSIS — R09A2 Foreign body sensation, throat: Secondary | ICD-10-CM | POA: Diagnosis not present

## 2022-12-17 DIAGNOSIS — D649 Anemia, unspecified: Secondary | ICD-10-CM

## 2022-12-17 DIAGNOSIS — N184 Chronic kidney disease, stage 4 (severe): Secondary | ICD-10-CM | POA: Diagnosis not present

## 2022-12-17 DIAGNOSIS — Z951 Presence of aortocoronary bypass graft: Secondary | ICD-10-CM

## 2022-12-17 DIAGNOSIS — I5020 Unspecified systolic (congestive) heart failure: Secondary | ICD-10-CM | POA: Diagnosis not present

## 2022-12-17 DIAGNOSIS — K760 Fatty (change of) liver, not elsewhere classified: Secondary | ICD-10-CM

## 2022-12-17 DIAGNOSIS — Z952 Presence of prosthetic heart valve: Secondary | ICD-10-CM

## 2022-12-17 DIAGNOSIS — E1169 Type 2 diabetes mellitus with other specified complication: Secondary | ICD-10-CM | POA: Diagnosis not present

## 2022-12-17 DIAGNOSIS — Z1211 Encounter for screening for malignant neoplasm of colon: Secondary | ICD-10-CM | POA: Diagnosis not present

## 2022-12-17 DIAGNOSIS — I4891 Unspecified atrial fibrillation: Secondary | ICD-10-CM | POA: Diagnosis not present

## 2022-12-17 MED ORDER — PANTOPRAZOLE SODIUM 40 MG PO TBEC: 40.0000 mg | DELAYED_RELEASE_TABLET | Freq: Every day | ORAL | 3 refills | Status: DC

## 2022-12-17 MED ORDER — NA SULFATE-K SULFATE-MG SULF 17.5-3.13-1.6 GM/177ML PO SOLN: 1.0000 | Freq: Once | ORAL | 0 refills | Status: AC

## 2022-12-17 NOTE — Patient Instructions (Signed)
Your provider has requested that you go to the basement level for lab work before leaving today. Press "B" on the elevator. The lab is located at the first door on the left as you exit the elevator.   You have been scheduled for a Barium Esophogram at [facility] (1st floor of the hospital) on 12/23/2022 at 10:00am . Please arrive 30 minutes prior to your appointment for registration. Make certain not to have anything to eat or drink 3 hours prior to your test. If you need to reschedule for any reason, please contact radiology at [phone] to do so. __________________________________________________________________ A barium swallow is an examination that concentrates on views of the esophagus. This tends to be a double contrast exam (barium and two liquids which, when combined, create a gas to distend the wall of the oesophagus) or single contrast (non-ionic iodine based). The study is usually tailored to your symptoms so a good history is essential. Attention is paid during the study to the form, structure and configuration of the esophagus, looking for functional disorders (such as aspiration, dysphagia, achalasia, motility and reflux) EXAMINATION You may be asked to change into a gown, depending on the type of swallow being performed. A radiologist and radiographer will perform the procedure. The radiologist will advise you of the type of contrast selected for your procedure and direct you during the exam. You will be asked to stand, sit or lie in several different positions and to hold a small amount of fluid in your mouth before being asked to swallow while the imaging is performed .In some instances you may be asked to swallow barium coated marshmallows to assess the motility of a solid food bolus. The exam can be recorded as a digital or video fluoroscopy procedure. POST PROCEDURE It will take 1-2 days for the barium to pass through your system. To facilitate this, it is important,  unless otherwise directed, to increase your fluids for the next 24-48hrs and to resume your normal diet.  This test typically takes about 30 minutes to perform. __________________________________________________________________________________   Eather Colas is an osmotic laxative.  It only brings more water into the stool.  This is safe to take daily.  Can take up to 17 gram of miralax twice a day.  Mix with juice or coffee.  Start 1 capful at night for 3-4 days and reassess your response in 3-4 days.  You can increase and decrease the dose based on your response.  Remember, it can take up to 3-4 days to take effect OR for the effects to wear off.   I often pair this with benefiber in the morning to help assure the stool is not too loose.   Dysphagia precautions:  1. Take reflux medications 30+ minutes before food in the morning, take protonix 40 mg once dialy for 30 day 2. Begin meals with warm beverage 3. Eat smaller more frequent meals 4. Eat slowly, taking small bites and sips 5. Alternate solids and liquids 6. Avoid foods/liquids that increase acid production 7. Sit upright during and for 30+ minutes after meals to facilitate esophageal clearing 8. All meats should be chopped finely.   If something gets hung in your esophagus and will not come up or go down, proceed to the emergency room.

## 2022-12-17 NOTE — Progress Notes (Signed)
12/17/2022 Antonio Cervantes [id number] [birth year]  Referring provider: Ellis Parents, FNP Primary GI doctor: Dr. Chales Cervantes  ASSESSMENT AND PLAN:   Globus sensation For last 6 months, previous history of smoking, denies GERD but has been on indomethacin.  This feels like mucus but no true dysphagia. Will schedule for barium swallow to evaluate further, low-dose Protonix for at least 30 days to see if this helps. Will schedule for EGD at the same time as a colonoscopy however if barium swallow is completely unremarkable and no IDA can cancel.  Screen for colon cancer Patient is never had screening colonoscopy, has some minor constipation but denies hematochezia or symptoms. Will plan for colonoscopy with Dr. Chales Cervantes, at this time patient does appear appropriate at the [facility], however he has had a diastolic heart failure exacerbation in March and again in May, will request medical clearance from Dr. Gala Cervantes.  CAD s/p Bypass, s/p aortic valve replacement, HFpEF Ischemic cardiomyopathy improved after bypass 2023 EF 55-60%, aortic valve doing well  Atrial fibrillation on eliquis Hold Eliquis for 2 days before procedure will instruct when and how to resume after procedure.  Patient understands that there is a low but real risk of cardiovascular event such as heart attack, stroke, or embolism /  thrombosis, or ischemia while off Eliquis  The patient consents to proceed.  Will communicate by phone or EMR with patient's prescribing provider to confirm that holding Eliquis is reasonable in this case.   Type 2 diabetes mellitus with other specified complication, without long-term current use of insulin (HCC) with CKD stage 4 Will recheck CMET Last GFR 21, has had increase in lasix Pending results, may need to be moved to hospital  Anemia Likely secondary to CKD, I do not see a recent iron Will check iron/ferritin  Mechanical fall in the parking lot on Eliquis No LOC Hit left upper  brow, no crepitus. Given symptoms for [facility] instructed go to the ER if any change in symptoms.  Fatty liver Seen on CT 2018, patient denies alcohol use, normal platelets, no evidence of ascites or hepatomegaly on exam.  Consider right upper quadrant abdominal ultrasound.  I recommend upper gastrointestinal and colorectal evaluation with an EGD and colonoscopy.  Risk of bowel prep, conscious sedation, and EGD and colonoscopy were discussed.  Risks include but are not limited to dehydration, pain, bleeding, cardiopulmonary process, bowel perforation, or other possible adverse outcomes..  Treatment plan was discussed with patient, and agreed upon.   Patient Care Team: Antonio Parents, FNP as PCP - General (Nurse Practitioner) Dr. Gala Cervantes  HISTORY OF PRESENT ILLNESS: 67 y.o. male with a past medical history of diabetes, CKD, aortic stenosis status post aortic valve replacement CAD status post four-vessel CABG last year, ischemic cardiomyopathy resolved status post CABG, HFpEF, PAF on Eliquis and others listed below presents for evaluation of globulus sensation.   07/07/2016 CT renal study hepatic steatosis 04/2021 NSETMI with acute heart failure and A-fib with RVR echo at that time showed LVEF 2025%, patient had catheterization that showed inoperable CAD distal vessel disease nonamenable to PCI.  Patient controlled with medical therapy.   Readmitted 07/18/21 with non-STEMI cardiac MRI LVEF 47% viable multivessel disease moderate aortic insufficiency underwent bypass x 4 and aortic valve replacement. 06/2022 admitted to the hospital with acute diastolic heart failure complicated by AKI on CKD taken off NSAIDs, diuresed. 07/12/2022 echocardiogram showed EF 55 to 60% well-positioned aortic valve. 09/05/2022 office visit with heart failure clinic, was doing well  at that time NYHA II 09/23/2022 recent ER visit for chest pain, troponins negative, chest x-ray unremarkable. He states he has had SOB and  tightness.   He has never had a colonoscopy, no family history of colon cancer, no family history of liver issues.   Patient had mechanical fall in the parking lot, no LOC, he did land on his legs and hit if right orbital/nose with scratch on it.  Daughter in law is with him, provides some of the history, Antonio Cervantes.  Patient presents today with globulus/foreign body sensation,6 month. Feels mucus is getting caught, upper paraesophageal region, denies issues with food, pills, liquids. Denies choking with food/drink.  Now has cough non productive cough, he has had more DOE, no swelling in legs but feels his stomach had been more swollen, he has started to take extra lasix total of 40 mg for 3 days.  He is on indomethacin 25 mg, 4-5 a month for gout flare.  He denies GERD.Marland Kitchen  He denies of melena, AB pain, nausea, vomiting.  Patient denies AB bloating/early satiety.  He denies unintentional weight loss. Denies history of neck surgery or radiation to the neck.  He has had more constipation since his CABG over a year ago, decreased appetite.   He reports blood thinner use, eliquis for Afib.  He reports NSAID use, indomethacin but cut back last year.  He denies ETOH use in last 40 years, prior to that would drink on vacation, never daily.  He denies tobacco use, quit 40 years ago, 22 pack year He denies drug use.    He  reports that he quit smoking about 41 years ago. His smoking use included cigarettes. He started smoking about 56 years ago. He has a 22.5 pack-year smoking history. He has never used smokeless tobacco. He reports that he does not drink alcohol and does not use drugs.   RELEVANT LABS AND IMAGING: CBC    Component Value Date/Time   WBC 10.5 09/23/2022 1515   RBC 3.76 (L) 09/23/2022 1515   HGB 12.2 (L) 09/23/2022 1520   HGB 12.3 (L) 04/02/2022 1258   HCT 36.0 (L) 09/23/2022 1520   HCT 37.6 04/02/2022 1258   PLT 244 09/23/2022 1515   PLT 274 04/02/2022 1258   MCV 93.4  09/23/2022 1515   MCV 93 04/02/2022 1258   MCH 30.9 09/23/2022 1515   MCHC 33.0 09/23/2022 1515   RDW 14.1 09/23/2022 1515   RDW 13.4 04/02/2022 1258   LYMPHSABS 1.8 07/22/2021 2224   MONOABS 0.6 07/22/2021 2224   EOSABS 0.4 07/22/2021 2224   BASOSABS 0.1 07/22/2021 2224   Recent Labs    04/02/22 1258 05/22/22 1229 07/11/22 1550 07/12/22 0813 07/13/22 0106 07/25/22 1050 09/23/22 1515 09/23/22 1520  HGB 12.3* 12.0* 11.5* 9.2* 10.3* 10.6* 11.6* 12.2*    CMP     Component Value Date/Time   NA 138 09/23/2022 1520   NA 137 05/08/2022 1448   K 4.8 09/23/2022 1520   CL 105 09/23/2022 1520   CO2 19 (L) 09/23/2022 1515   GLUCOSE 158 (H) 09/23/2022 1520   BUN 39 (H) 09/23/2022 1520   BUN 29 (H) 05/08/2022 1448   CREATININE 2.20 (H) 09/23/2022 1520   CALCIUM 9.4 09/23/2022 1515   PROT 5.9 (L) 07/12/2022 0813   ALBUMIN 3.1 (L) 07/12/2022 0813   AST 15 07/12/2022 0813   ALT 12 07/12/2022 0813   ALKPHOS 60 07/12/2022 0813   BILITOT 0.7 07/12/2022 0813   GFRNONAA 33 (L) 09/23/2022  1515   GFRAA >60 07/07/2016 0838      Latest Ref Rng & Units 07/12/2022    8:13 AM 07/11/2022    7:04 PM 05/22/2022   12:29 PM  Hepatic Function  Total Protein 6.5 - 8.1 g/dL 5.9  6.5  7.5   Albumin 3.5 - 5.0 g/dL 3.1  3.4  3.8   AST 15 - 41 U/L 15  20  25    ALT 0 - 44 U/L 12  12  14    Alk Phosphatase 38 - 126 U/L 60  71  90   Total Bilirubin 0.3 - 1.2 mg/dL 0.7  0.6  0.7   Bilirubin, Direct 0.0 - 0.2 mg/dL  <1.3        Current Medications:   Current Outpatient Medications (Endocrine & Metabolic):    dapagliflozin propanediol (FARXIGA) 10 MG TABS tablet, Take 1 tablet (10 mg total) by mouth daily before breakfast.   glipiZIDE (GLUCOTROL) 5 MG tablet, Take 5 mg by mouth daily.   levothyroxine (SYNTHROID) 112 MCG tablet, Take 112 mcg by mouth daily.  Current Outpatient Medications (Cardiovascular):    atorvastatin (LIPITOR) 80 MG tablet, Take 1 tablet (80 mg total) by mouth daily.    ezetimibe (ZETIA) 10 MG tablet, Take 1 tablet (10 mg total) by mouth daily. (Patient not taking: Reported on 12/17/2022)   furosemide (LASIX) 20 MG tablet, Take 20 mg by mouth daily.   metoprolol succinate (TOPROL-XL) 25 MG 24 hr tablet, Take 1 tablet (25 mg total) by mouth daily.   Current Outpatient Medications (Analgesics):    allopurinol (ZYLOPRIM) 100 MG tablet, Take 200 mg by mouth daily. (Patient not taking: Reported on 09/05/2022)   aspirin 81 MG EC tablet, Take 1 tablet (81 mg total) by mouth daily. Swallow whole.  Current Outpatient Medications (Hematological):    apixaban (ELIQUIS) 5 MG TABS tablet, TAKE 1 TABLET BY MOUTH TWICE A DAY  Current Outpatient Medications (Other):    pantoprazole (PROTONIX) 40 MG tablet, Take 1 tablet (40 mg total) by mouth daily.   amoxicillin (AMOXIL) 500 MG tablet, Take 4 tablets by mouth one hour prior to dental appointments  Medical History:  Past Medical History:  Diagnosis Date   Atrial fibrillation and flutter (HCC)    Cardiac arrhythmia due to congenital heart disease    CKD (chronic kidney disease)    Diabetes mellitus without complication (HCC)    Diabetic peripheral neuropathy associated with type 2 diabetes mellitus (HCC)    Heart failure (HCC)    High blood cholesterol    Thyroid disease    Allergies: No Known Allergies   Surgical History:  He  has a past surgical history that includes RIGHT/LEFT HEART CATH AND CORONARY ANGIOGRAPHY (N/A, 05/21/2021); Coronary artery bypass graft (N/A, 07/29/2021); Aortic valve replacement (N/A, 07/29/2021); and TEE without cardioversion (N/A, 07/29/2021). Family History:  His family history includes Diabetes in his father and mother; Heart attack in his maternal uncle; Throat cancer in his brother.  REVIEW OF SYSTEMS  : All other systems reviewed and negative except where noted in the History of Present Illness.  PHYSICAL EXAM: BP 118/68   Ht 5\' 6"  (1.676 m)   Wt 170 lb (77.1 kg)   BMI 27.44 kg/m   General Appearance: Obese, in no apparent distress. Head: Abrasion on left nose with hematoma/ecchymosis on left frontal skull above eyebrow Eyes:  sclerae anicteric,conjunctive pink  Respiratory: Respiratory effort normal, BS equal bilaterally without rales, rhonchi, wheezing. Cardio: RRR with no MRGs. Peripheral  pulses intact.  Abdomen: Soft,  Obese ,active bowel sounds. No tenderness . Without guarding and Without rebound. No masses.  No ascites. Rectal: Not evaluated Musculoskeletal: Full ROM, Normal gait, patient was able to get on the table with minimal assistance Without edema. Skin: Bilateral ecchymoses on arms, legs, healed ulcerations bilateral arms and legs. Neuro: Alert and  oriented x4;  No focal deficits. Psych:  Cooperative. Normal mood and affect.    Doree Albee, PA-C 2:36 PM

## 2022-12-17 NOTE — Progress Notes (Signed)
Agree with assessment/plan.  Raj Gupta, MD [facility] [phone]  

## 2022-12-19 ENCOUNTER — Telehealth: Payer: Self-pay

## 2022-12-19 NOTE — Telephone Encounter (Signed)
Patient with diagnosis of afib on Eliquis for anticoagulation.    Procedure: endoscopy/colonoscopy Date of procedure: 03/03/23  CHA2DS2-VASc Score = 5  This indicates a 7.2% annual risk of stroke. The patient's score is based upon: CHF History: 1 HTN History: 1 Diabetes History: 1 Stroke History: 0 Vascular Disease History: 1 Age Score: 1 Gender Score: 0   CrCl 72mL/min Platelet count 244K  DOAC should not require 5 day hold prior to procedure, only warfarin does. Per protocol, patient can hold Eliquis for 2 days prior to procedure.  **This guidance is not considered finalized until pre-operative APP has relayed final recommendations.**

## 2022-12-19 NOTE — Telephone Encounter (Signed)
[facility] Pre-operative Risk Assessment     Request for surgical clearance:     Endoscopy Procedure  What type of surgery is being performed?     Endo/colon  When is this surgery scheduled?     03/03/2023  What type of clearance is required ?   Pharmacy  Are there any medications that need to be held prior to surgery and how long? Eliquis- 5 day, he has had a diastolic heart failure exacerbation in March and again in May, also requesting medical clearance.    Practice name and name of physician performing surgery?      [facility]  What is your office phone and fax number?      Phone- [phone]  Fax- [phone]  Anesthesia type (None, local, MAC, general) ?       MAC

## 2022-12-19 NOTE — Telephone Encounter (Signed)
   Name: ARMANY TORREZ  DOB: [birth year]  MRN: [id number]  Primary Cardiologist: None  Chart reviewed as part of pre-operative protocol coverage. The patient has an upcoming visit scheduled with manage heart failure extender on 01/07/2023 at which time clearance can be addressed in case there are any issues that would impact surgical recommendations.   I added preop FYI to appointment note so that provider is aware to address at time of outpatient visit.  Per office protocol the cardiology provider should forward their finalized clearance decision and recommendations regarding antiplatelet therapy to the requesting party below.    DOAC should not require 5 day hold prior to procedure, only warfarin does. Per protocol, patient can hold Eliquis for 2 days prior to procedure.   I will route this message as FYI to requesting party and remove this message from the preop box as separate preop APP input not needed at this time.   Please call with any questions.  Napoleon Form, Leodis Rains, NP  12/19/2022, 1:06 PM

## 2022-12-22 ENCOUNTER — Telehealth: Payer: Self-pay

## 2022-12-22 NOTE — Telephone Encounter (Signed)
Called patient and discussed with patients daughter to please hold eliquis 2 days prior, pt's daughter verbalized understanding.

## 2022-12-23 ENCOUNTER — Other Ambulatory Visit: Payer: Self-pay | Admitting: Physician Assistant

## 2022-12-23 ENCOUNTER — Other Ambulatory Visit (INDEPENDENT_AMBULATORY_CARE_PROVIDER_SITE_OTHER): Payer: Medicare Other

## 2022-12-23 ENCOUNTER — Ambulatory Visit (HOSPITAL_COMMUNITY)
Admission: RE | Admit: 2022-12-23 | Discharge: 2022-12-23 | Disposition: A | Discharge status: Home / Self Care | Payer: Medicare Other | Source: Ambulatory Visit | Attending: Physician Assistant | Admitting: Physician Assistant

## 2022-12-23 DIAGNOSIS — R09A2 Foreign body sensation, throat: Secondary | ICD-10-CM | POA: Insufficient documentation

## 2022-12-23 DIAGNOSIS — D649 Anemia, unspecified: Secondary | ICD-10-CM | POA: Diagnosis not present

## 2022-12-23 DIAGNOSIS — R1310 Dysphagia, unspecified: Secondary | ICD-10-CM | POA: Diagnosis not present

## 2022-12-23 LAB — COMPREHENSIVE METABOLIC PANEL
ALT: 15 U/L (ref 0–53)
AST: 17 U/L (ref 0–37)
Albumin: 4.2 g/dL (ref 3.5–5.2)
Alkaline Phosphatase: 100 U/L (ref 39–117)
BUN: 60 mg/dL — ABNORMAL HIGH (ref 6–23)
CO2: 26 mEq/L (ref 19–32)
Calcium: 9.9 mg/dL (ref 8.4–10.5)
Chloride: 102 mEq/L (ref 96–112)
Creatinine, Ser: 2.9 mg/dL — ABNORMAL HIGH (ref 0.40–1.50)
GFR: 21.65 mL/min — ABNORMAL LOW (ref >60.00)
Glucose, Bld: 214 mg/dL — ABNORMAL HIGH (ref 70–99)
Potassium: 5.3 mEq/L — ABNORMAL HIGH (ref 3.5–5.1)
Sodium: 141 mEq/L (ref 135–145)
Total Bilirubin: 0.4 mg/dL (ref 0.2–1.2)
Total Protein: 8 g/dL (ref 6.0–8.3)

## 2022-12-23 LAB — IBC + FERRITIN
Ferritin: 90 ng/mL (ref 22.0–322.0)
Iron: 61 ug/dL (ref 42–165)
Saturation Ratios: 19.9 % — ABNORMAL LOW (ref 20.0–50.0)
TIBC: 306.6 ug/dL (ref 250.0–450.0)
Transferrin: 219 mg/dL (ref 212.0–360.0)

## 2022-12-23 LAB — CBC WITH DIFFERENTIAL/PLATELET
Basophils Absolute: 0.2 10*3/uL — ABNORMAL HIGH (ref 0.0–0.1)
Basophils Relative: 1.4 % (ref 0.0–3.0)
Eosinophils Absolute: 0.7 10*3/uL (ref 0.0–0.7)
Eosinophils Relative: 4.7 % (ref 0.0–5.0)
HCT: 30.8 % — ABNORMAL LOW (ref 39.0–52.0)
Hemoglobin: 10 g/dL — ABNORMAL LOW (ref 13.0–17.0)
Lymphocytes Relative: 15.4 % (ref 12.0–46.0)
Lymphs Abs: 2.2 10*3/uL (ref 0.7–4.0)
MCHC: 32.3 g/dL (ref 30.0–36.0)
MCV: 94.4 fl (ref 78.0–100.0)
Monocytes Absolute: 1 10*3/uL (ref 0.1–1.0)
Monocytes Relative: 6.9 % (ref 3.0–12.0)
Neutro Abs: 10.2 10*3/uL — ABNORMAL HIGH (ref 1.4–7.7)
Neutrophils Relative %: 71.6 % (ref 43.0–77.0)
Platelets: 298 10*3/uL (ref 150.0–400.0)
RBC: 3.27 Mil/uL — ABNORMAL LOW (ref 4.22–5.81)
RDW: 14.2 % (ref 11.5–15.5)
WBC: 14.3 10*3/uL — ABNORMAL HIGH (ref 4.0–10.5)

## 2022-12-24 ENCOUNTER — Other Ambulatory Visit: Payer: Self-pay

## 2022-12-24 DIAGNOSIS — D649 Anemia, unspecified: Secondary | ICD-10-CM

## 2022-12-24 DIAGNOSIS — N184 Chronic kidney disease, stage 4 (severe): Secondary | ICD-10-CM

## 2022-12-25 DIAGNOSIS — E1122 Type 2 diabetes mellitus with diabetic chronic kidney disease: Secondary | ICD-10-CM | POA: Diagnosis not present

## 2022-12-25 DIAGNOSIS — E782 Mixed hyperlipidemia: Secondary | ICD-10-CM | POA: Diagnosis not present

## 2022-12-25 DIAGNOSIS — I5020 Unspecified systolic (congestive) heart failure: Secondary | ICD-10-CM | POA: Diagnosis not present

## 2022-12-25 DIAGNOSIS — H539 Unspecified visual disturbance: Secondary | ICD-10-CM | POA: Diagnosis not present

## 2022-12-25 DIAGNOSIS — M109 Gout, unspecified: Secondary | ICD-10-CM | POA: Diagnosis not present

## 2022-12-25 DIAGNOSIS — N182 Chronic kidney disease, stage 2 (mild): Secondary | ICD-10-CM | POA: Diagnosis not present

## 2022-12-25 NOTE — Telephone Encounter (Signed)
Inbound call from patient daughter requesting a call back in regards to previous note. Please advise.

## 2022-12-28 ENCOUNTER — Inpatient Hospital Stay (HOSPITAL_COMMUNITY)
Admission: EM | Admit: 2022-12-28 | Discharge: 2023-01-01 | DRG: 377 | Disposition: A | Discharge status: Home / Self Care | Payer: Medicare Other | Attending: Internal Medicine | Admitting: Internal Medicine

## 2022-12-28 ENCOUNTER — Emergency Department (HOSPITAL_COMMUNITY): Payer: Medicare Other

## 2022-12-28 ENCOUNTER — Encounter (HOSPITAL_COMMUNITY): Payer: Self-pay | Admitting: Internal Medicine

## 2022-12-28 ENCOUNTER — Other Ambulatory Visit: Payer: Self-pay

## 2022-12-28 DIAGNOSIS — Z743 Need for continuous supervision: Secondary | ICD-10-CM | POA: Diagnosis not present

## 2022-12-28 DIAGNOSIS — D72829 Elevated white blood cell count, unspecified: Secondary | ICD-10-CM | POA: Diagnosis present

## 2022-12-28 DIAGNOSIS — Z79899 Other long term (current) drug therapy: Secondary | ICD-10-CM

## 2022-12-28 DIAGNOSIS — K259 Gastric ulcer, unspecified as acute or chronic, without hemorrhage or perforation: Secondary | ICD-10-CM | POA: Diagnosis not present

## 2022-12-28 DIAGNOSIS — K2971 Gastritis, unspecified, with bleeding: Secondary | ICD-10-CM

## 2022-12-28 DIAGNOSIS — K250 Acute gastric ulcer with hemorrhage: Principal | ICD-10-CM

## 2022-12-28 DIAGNOSIS — Z7901 Long term (current) use of anticoagulants: Secondary | ICD-10-CM | POA: Diagnosis not present

## 2022-12-28 DIAGNOSIS — I9789 Other postprocedural complications and disorders of the circulatory system, not elsewhere classified: Secondary | ICD-10-CM | POA: Diagnosis not present

## 2022-12-28 DIAGNOSIS — D62 Acute posthemorrhagic anemia: Secondary | ICD-10-CM | POA: Diagnosis not present

## 2022-12-28 DIAGNOSIS — D649 Anemia, unspecified: Secondary | ICD-10-CM | POA: Diagnosis not present

## 2022-12-28 DIAGNOSIS — Z833 Family history of diabetes mellitus: Secondary | ICD-10-CM

## 2022-12-28 DIAGNOSIS — I10 Essential (primary) hypertension: Secondary | ICD-10-CM | POA: Diagnosis not present

## 2022-12-28 DIAGNOSIS — E785 Hyperlipidemia, unspecified: Secondary | ICD-10-CM | POA: Diagnosis present

## 2022-12-28 DIAGNOSIS — S299XXA Unspecified injury of thorax, initial encounter: Secondary | ICD-10-CM | POA: Diagnosis not present

## 2022-12-28 DIAGNOSIS — I130 Hypertensive heart and chronic kidney disease with heart failure and stage 1 through stage 4 chronic kidney disease, or unspecified chronic kidney disease: Secondary | ICD-10-CM | POA: Diagnosis not present

## 2022-12-28 DIAGNOSIS — R404 Transient alteration of awareness: Secondary | ICD-10-CM | POA: Diagnosis not present

## 2022-12-28 DIAGNOSIS — I5043 Acute on chronic combined systolic (congestive) and diastolic (congestive) heart failure: Secondary | ICD-10-CM | POA: Diagnosis present

## 2022-12-28 DIAGNOSIS — K2970 Gastritis, unspecified, without bleeding: Secondary | ICD-10-CM | POA: Diagnosis not present

## 2022-12-28 DIAGNOSIS — N184 Chronic kidney disease, stage 4 (severe): Secondary | ICD-10-CM | POA: Diagnosis present

## 2022-12-28 DIAGNOSIS — K921 Melena: Secondary | ICD-10-CM

## 2022-12-28 DIAGNOSIS — N179 Acute kidney failure, unspecified: Secondary | ICD-10-CM | POA: Diagnosis not present

## 2022-12-28 DIAGNOSIS — I5020 Unspecified systolic (congestive) heart failure: Secondary | ICD-10-CM | POA: Diagnosis not present

## 2022-12-28 DIAGNOSIS — E1100 Type 2 diabetes mellitus with hyperosmolarity without nonketotic hyperglycemic-hyperosmolar coma (NKHHC): Secondary | ICD-10-CM | POA: Diagnosis not present

## 2022-12-28 DIAGNOSIS — I252 Old myocardial infarction: Secondary | ICD-10-CM

## 2022-12-28 DIAGNOSIS — I5021 Acute systolic (congestive) heart failure: Secondary | ICD-10-CM | POA: Diagnosis not present

## 2022-12-28 DIAGNOSIS — I2510 Atherosclerotic heart disease of native coronary artery without angina pectoris: Secondary | ICD-10-CM | POA: Diagnosis present

## 2022-12-28 DIAGNOSIS — E1169 Type 2 diabetes mellitus with other specified complication: Secondary | ICD-10-CM | POA: Diagnosis not present

## 2022-12-28 DIAGNOSIS — I4891 Unspecified atrial fibrillation: Secondary | ICD-10-CM | POA: Diagnosis present

## 2022-12-28 DIAGNOSIS — K224 Dyskinesia of esophagus: Secondary | ICD-10-CM | POA: Diagnosis not present

## 2022-12-28 DIAGNOSIS — Z952 Presence of prosthetic heart valve: Secondary | ICD-10-CM

## 2022-12-28 DIAGNOSIS — E039 Hypothyroidism, unspecified: Secondary | ICD-10-CM | POA: Diagnosis not present

## 2022-12-28 DIAGNOSIS — E119 Type 2 diabetes mellitus without complications: Secondary | ICD-10-CM

## 2022-12-28 DIAGNOSIS — K6289 Other specified diseases of anus and rectum: Secondary | ICD-10-CM | POA: Diagnosis not present

## 2022-12-28 DIAGNOSIS — Z555 Less than a high school diploma: Secondary | ICD-10-CM

## 2022-12-28 DIAGNOSIS — Z79890 Hormone replacement therapy (postmenopausal): Secondary | ICD-10-CM

## 2022-12-28 DIAGNOSIS — E1142 Type 2 diabetes mellitus with diabetic polyneuropathy: Secondary | ICD-10-CM | POA: Diagnosis present

## 2022-12-28 DIAGNOSIS — Z951 Presence of aortocoronary bypass graft: Secondary | ICD-10-CM | POA: Diagnosis not present

## 2022-12-28 DIAGNOSIS — K922 Gastrointestinal hemorrhage, unspecified: Secondary | ICD-10-CM | POA: Diagnosis present

## 2022-12-28 DIAGNOSIS — I4819 Other persistent atrial fibrillation: Secondary | ICD-10-CM | POA: Diagnosis present

## 2022-12-28 DIAGNOSIS — E7800 Pure hypercholesterolemia, unspecified: Secondary | ICD-10-CM | POA: Diagnosis not present

## 2022-12-28 DIAGNOSIS — R55 Syncope and collapse: Secondary | ICD-10-CM | POA: Diagnosis present

## 2022-12-28 DIAGNOSIS — Z8673 Personal history of transient ischemic attack (TIA), and cerebral infarction without residual deficits: Secondary | ICD-10-CM

## 2022-12-28 DIAGNOSIS — G9389 Other specified disorders of brain: Secondary | ICD-10-CM | POA: Diagnosis not present

## 2022-12-28 DIAGNOSIS — S3993XA Unspecified injury of pelvis, initial encounter: Secondary | ICD-10-CM | POA: Diagnosis not present

## 2022-12-28 DIAGNOSIS — I480 Paroxysmal atrial fibrillation: Secondary | ICD-10-CM | POA: Diagnosis present

## 2022-12-28 DIAGNOSIS — S3991XA Unspecified injury of abdomen, initial encounter: Secondary | ICD-10-CM | POA: Diagnosis not present

## 2022-12-28 DIAGNOSIS — R0600 Dyspnea, unspecified: Secondary | ICD-10-CM | POA: Diagnosis not present

## 2022-12-28 DIAGNOSIS — K254 Chronic or unspecified gastric ulcer with hemorrhage: Secondary | ICD-10-CM | POA: Diagnosis not present

## 2022-12-28 DIAGNOSIS — Z7982 Long term (current) use of aspirin: Secondary | ICD-10-CM

## 2022-12-28 DIAGNOSIS — S0990XA Unspecified injury of head, initial encounter: Secondary | ICD-10-CM | POA: Diagnosis not present

## 2022-12-28 DIAGNOSIS — M109 Gout, unspecified: Secondary | ICD-10-CM | POA: Diagnosis present

## 2022-12-28 DIAGNOSIS — R6889 Other general symptoms and signs: Secondary | ICD-10-CM | POA: Diagnosis not present

## 2022-12-28 DIAGNOSIS — I255 Ischemic cardiomyopathy: Secondary | ICD-10-CM | POA: Diagnosis present

## 2022-12-28 DIAGNOSIS — I5022 Chronic systolic (congestive) heart failure: Secondary | ICD-10-CM | POA: Diagnosis not present

## 2022-12-28 DIAGNOSIS — I21A1 Myocardial infarction type 2: Secondary | ICD-10-CM | POA: Diagnosis not present

## 2022-12-28 DIAGNOSIS — I214 Non-ST elevation (NSTEMI) myocardial infarction: Secondary | ICD-10-CM | POA: Diagnosis not present

## 2022-12-28 DIAGNOSIS — W19XXXA Unspecified fall, initial encounter: Secondary | ICD-10-CM | POA: Diagnosis present

## 2022-12-28 DIAGNOSIS — Z87891 Personal history of nicotine dependence: Secondary | ICD-10-CM

## 2022-12-28 DIAGNOSIS — E1122 Type 2 diabetes mellitus with diabetic chronic kidney disease: Secondary | ICD-10-CM | POA: Diagnosis not present

## 2022-12-28 DIAGNOSIS — I700 Atherosclerosis of aorta: Secondary | ICD-10-CM | POA: Diagnosis not present

## 2022-12-28 DIAGNOSIS — R739 Hyperglycemia, unspecified: Secondary | ICD-10-CM | POA: Diagnosis not present

## 2022-12-28 DIAGNOSIS — R531 Weakness: Secondary | ICD-10-CM | POA: Diagnosis not present

## 2022-12-28 DIAGNOSIS — Z7984 Long term (current) use of oral hypoglycemic drugs: Secondary | ICD-10-CM

## 2022-12-28 DIAGNOSIS — R270 Ataxia, unspecified: Secondary | ICD-10-CM | POA: Diagnosis not present

## 2022-12-28 DIAGNOSIS — D638 Anemia in other chronic diseases classified elsewhere: Secondary | ICD-10-CM

## 2022-12-28 DIAGNOSIS — Z8249 Family history of ischemic heart disease and other diseases of the circulatory system: Secondary | ICD-10-CM

## 2022-12-28 LAB — COMPREHENSIVE METABOLIC PANEL
ALT: 15 U/L (ref 0–44)
AST: 15 U/L (ref 15–41)
Albumin: 3.1 g/dL — ABNORMAL LOW (ref 3.5–5.0)
Alkaline Phosphatase: 59 U/L (ref 38–126)
Anion gap: 13 (ref 5–15)
BUN: 103 mg/dL — ABNORMAL HIGH (ref 8–23)
CO2: 19 mmol/L — ABNORMAL LOW (ref 22–32)
Calcium: 8.3 mg/dL — ABNORMAL LOW (ref 8.9–10.3)
Chloride: 106 mmol/L (ref 98–111)
Creatinine, Ser: 2.45 mg/dL — ABNORMAL HIGH (ref 0.61–1.24)
GFR, Estimated: 28 mL/min — ABNORMAL LOW (ref >60)
Glucose, Bld: 213 mg/dL — ABNORMAL HIGH (ref 70–99)
Potassium: 4.4 mmol/L (ref 3.5–5.1)
Sodium: 138 mmol/L (ref 135–145)
Total Bilirubin: 0.6 mg/dL (ref 0.3–1.2)
Total Protein: 6.1 g/dL — ABNORMAL LOW (ref 6.5–8.1)

## 2022-12-28 LAB — PROTIME-INR
INR: 1.9 — ABNORMAL HIGH (ref 0.8–1.2)
Prothrombin Time: 21.8 s — ABNORMAL HIGH (ref 11.4–15.2)

## 2022-12-28 LAB — TROPONIN I (HIGH SENSITIVITY)
Troponin I (High Sensitivity): 14 ng/L (ref <18)
Troponin I (High Sensitivity): 83 ng/L — ABNORMAL HIGH (ref <18)

## 2022-12-28 LAB — I-STAT VENOUS BLOOD GAS, ED
Acid-base deficit: 7 mmol/L — ABNORMAL HIGH (ref 0.0–2.0)
Bicarbonate: 19.5 mmol/L — ABNORMAL LOW (ref 20.0–28.0)
Calcium, Ion: 1.17 mmol/L (ref 1.15–1.40)
HCT: 21 % — ABNORMAL LOW (ref 39.0–52.0)
Hemoglobin: 7.1 g/dL — ABNORMAL LOW (ref 13.0–17.0)
O2 Saturation: 98 %
Potassium: 4.5 mmol/L (ref 3.5–5.1)
Sodium: 139 mmol/L (ref 135–145)
TCO2: 21 mmol/L — ABNORMAL LOW (ref 22–32)
pCO2, Ven: 45.1 mmHg (ref 44–60)
pH, Ven: 7.244 — ABNORMAL LOW (ref 7.25–7.43)
pO2, Ven: 126 mmHg — ABNORMAL HIGH (ref 32–45)

## 2022-12-28 LAB — CBC WITH DIFFERENTIAL/PLATELET
Abs Immature Granulocytes: 0.07 10*3/uL (ref 0.00–0.07)
Basophils Absolute: 0.1 10*3/uL (ref 0.0–0.1)
Basophils Relative: 1 %
Eosinophils Absolute: 0.3 10*3/uL (ref 0.0–0.5)
Eosinophils Relative: 2 %
HCT: 19.6 % — ABNORMAL LOW (ref 39.0–52.0)
Hemoglobin: 6.4 g/dL — CL (ref 13.0–17.0)
Immature Granulocytes: 1 %
Lymphocytes Relative: 11 %
Lymphs Abs: 1.5 10*3/uL (ref 0.7–4.0)
MCH: 31.2 pg (ref 26.0–34.0)
MCHC: 32.7 g/dL (ref 30.0–36.0)
MCV: 95.6 fL (ref 80.0–100.0)
Monocytes Absolute: 0.7 10*3/uL (ref 0.1–1.0)
Monocytes Relative: 5 %
Neutro Abs: 10.6 10*3/uL — ABNORMAL HIGH (ref 1.7–7.7)
Neutrophils Relative %: 80 %
Platelets: 184 10*3/uL (ref 150–400)
RBC: 2.05 MIL/uL — ABNORMAL LOW (ref 4.22–5.81)
RDW: 13.5 % (ref 11.5–15.5)
WBC: 13.3 10*3/uL — ABNORMAL HIGH (ref 4.0–10.5)
nRBC: 0 % (ref 0.0–0.2)

## 2022-12-28 LAB — GLUCOSE, CAPILLARY
Glucose-Capillary: 173 mg/dL — ABNORMAL HIGH (ref 70–99)
Glucose-Capillary: 175 mg/dL — ABNORMAL HIGH (ref 70–99)
Glucose-Capillary: 199 mg/dL — ABNORMAL HIGH (ref 70–99)

## 2022-12-28 LAB — PREPARE RBC (CROSSMATCH)

## 2022-12-28 LAB — APTT: aPTT: 30 s (ref 24–36)

## 2022-12-28 LAB — BRAIN NATRIURETIC PEPTIDE: B Natriuretic Peptide: 217.6 pg/mL — ABNORMAL HIGH (ref 0.0–100.0)

## 2022-12-28 MED ORDER — SODIUM CHLORIDE 0.9% IV SOLUTION: Freq: Once | INTRAVENOUS | Status: AC

## 2022-12-28 MED ORDER — ACETAMINOPHEN 325 MG PO TABS: 650.0000 mg | ORAL_TABLET | Freq: Four times a day (QID) | ORAL | Status: DC | PRN

## 2022-12-28 MED ORDER — ACETAMINOPHEN 650 MG RE SUPP: 650.0000 mg | Freq: Four times a day (QID) | RECTAL | Status: DC | PRN

## 2022-12-28 MED ORDER — PROTHROMBIN COMPLEX CONC HUMAN 500 UNITS IV KIT
3698.0000 [IU] | PACK | Status: AC
  Administered 2022-12-28: 3698 [IU] via INTRAVENOUS
  Filled 2022-12-28: qty 3698

## 2022-12-28 MED ORDER — PANTOPRAZOLE INFUSION (NEW) - SIMPLE MED
8.0000 mg/h | INTRAVENOUS | Status: DC
  Administered 2022-12-28 – 2022-12-31 (×7): 8 mg/h via INTRAVENOUS
  Filled 2022-12-28 (×10): qty 100

## 2022-12-28 MED ORDER — IOHEXOL 350 MG/ML SOLN
75.0000 mL | Freq: Once | INTRAVENOUS | Status: AC | PRN
  Administered 2022-12-28: 75 mL via INTRAVENOUS

## 2022-12-28 MED ORDER — MORPHINE SULFATE (PF) 2 MG/ML IV SOLN: 1.0000 mg | INTRAVENOUS | Status: DC | PRN

## 2022-12-28 MED ORDER — SODIUM CHLORIDE 0.9 % IV SOLN
1.0000 g | Freq: Once | INTRAVENOUS | Status: AC
  Administered 2022-12-28: 1 g via INTRAVENOUS
  Filled 2022-12-28: qty 10

## 2022-12-28 MED ORDER — INSULIN ASPART 100 UNIT/ML IJ SOLN
0.0000 [IU] | INTRAMUSCULAR | Status: DC
  Administered 2022-12-28 (×2): 2 [IU] via SUBCUTANEOUS
  Administered 2022-12-29 (×2): 1 [IU] via SUBCUTANEOUS
  Administered 2022-12-29: 2 [IU] via SUBCUTANEOUS
  Administered 2022-12-29 (×3): 1 [IU] via SUBCUTANEOUS
  Administered 2022-12-30: 2 [IU] via SUBCUTANEOUS
  Administered 2022-12-30: 3 [IU] via SUBCUTANEOUS
  Administered 2022-12-31 (×2): 2 [IU] via SUBCUTANEOUS
  Administered 2022-12-31 – 2023-01-01 (×4): 1 [IU] via SUBCUTANEOUS

## 2022-12-28 MED ORDER — PANTOPRAZOLE 80MG IVPB - SIMPLE MED
80.0000 mg | Freq: Once | INTRAVENOUS | Status: AC
  Administered 2022-12-28: 80 mg via INTRAVENOUS
  Filled 2022-12-28: qty 100

## 2022-12-28 MED ORDER — PANTOPRAZOLE SODIUM 40 MG IV SOLR: 40.0000 mg | Freq: Two times a day (BID) | INTRAVENOUS | Status: DC

## 2022-12-28 NOTE — ED Provider Notes (Signed)
[place] EMERGENCY DEPARTMENT AT [facility] Provider Note   CSN: [id number] Arrival date & time: 12/28/22  1102     History  Chief Complaint  Patient presents with   Dizziness    Pt coming from home via EMS with a few days of increased weakness and feeling dizzy. Pt denies any chest pain or shob.     Antonio Cervantes is a 67 y.o. male.  This is a 67 year old male here today for weakness, light headedness.  Patient also felt short of breath this morning.  He has a history of diastolic heart failure.  He had a fall 2 days ago at his doctor's office and struck his head.  He did not have any imaging done at that time.  He is on Eliquis.  He says that over the last 2 days, he has been feeling increasingly lightheaded, and today he felt weak.  He denies any dark stools.  Reviewed the patient's labs from Drs. Office on 827, and hemoglobin of 10 at that time, 2 point drop from 3 months prior.   Dizziness      Home Medications Prior to Admission medications   Medication Sig Start Date End Date Taking? Authorizing Provider  allopurinol (ZYLOPRIM) 100 MG tablet Take 200 mg by mouth daily. Patient not taking: Reported on 09/05/2022    [provider]  amoxicillin (AMOXIL) 500 MG tablet Take 4 tablets by mouth one hour prior to dental appointments 07/02/22   Corky Crafts, MD  apixaban (ELIQUIS) 5 MG TABS tablet TAKE 1 TABLET BY MOUTH TWICE A DAY 08/04/22   Bensimhon, Bevelyn Buckles, MD  aspirin 81 MG EC tablet Take 1 tablet (81 mg total) by mouth daily. Swallow whole. 05/23/21   Arty Baumgartner, NP  atorvastatin (LIPITOR) 80 MG tablet Take 1 tablet (80 mg total) by mouth daily. 05/24/21   Burnadette Pop, MD  dapagliflozin propanediol (FARXIGA) 10 MG TABS tablet Take 1 tablet (10 mg total) by mouth daily before breakfast. 09/05/22   Milford, Anderson Malta, FNP  ezetimibe (ZETIA) 10 MG tablet Take 1 tablet (10 mg total) by mouth daily. Patient not taking: Reported on 12/17/2022  04/02/22   Dyann Kief, PA-C  furosemide (LASIX) 20 MG tablet Take 20 mg by mouth daily.    [provider]  glipiZIDE (GLUCOTROL) 5 MG tablet Take 5 mg by mouth daily. 09/01/22   [provider]  levothyroxine (SYNTHROID) 112 MCG tablet Take 112 mcg by mouth daily. 02/22/22   [provider]  metoprolol succinate (TOPROL-XL) 25 MG 24 hr tablet Take 1 tablet (25 mg total) by mouth daily. 09/05/22   Milford, Anderson Malta, FNP  pantoprazole (PROTONIX) 40 MG tablet Take 1 tablet (40 mg total) by mouth daily. 12/17/22   Doree Albee, PA-C      Allergies    Patient has no known allergies.    Review of Systems   Review of Systems  Neurological:  Positive for dizziness.    Physical Exam Updated Vital Signs BP (!) 105/53   Pulse 82   Temp 97.9 F (36.6 C) (Oral)   Resp 19   Ht 5\' 6"  (1.676 m)   Wt 77.1 kg   SpO2 100%   BMI 27.44 kg/m  Physical Exam Vitals reviewed.  Constitutional:      Appearance: He is ill-appearing.  HENT:     Head: Normocephalic and atraumatic.     Mouth/Throat:     Mouth: Mucous membranes are  moist.  Eyes:     Comments: Pale conjunctiva  Left Perry orbital ecchymosis.  Pupils equal and reactive  Cardiovascular:     Rate and Rhythm: Normal rate.     Heart sounds: Murmur heard.  Pulmonary:     Effort: Pulmonary effort is normal.     Breath sounds: Normal breath sounds.  Abdominal:     General: Abdomen is flat.     Palpations: Abdomen is soft.     Tenderness: There is no abdominal tenderness.  Musculoskeletal:        General: No deformity. Normal range of motion.     Cervical back: Normal range of motion.  Skin:    Coloration: Skin is pale.  Neurological:     General: No focal deficit present.     Mental Status: He is alert.     Cranial Nerves: No cranial nerve deficit.     Sensory: No sensory deficit.     Motor: No weakness.  Psychiatric:        Mood and Affect: Mood normal.     ED Results / Procedures /  Treatments   Labs (all labs ordered are listed, but only abnormal results are displayed) Labs Reviewed  COMPREHENSIVE METABOLIC PANEL - Abnormal; Notable for the following components:      Result Value   CO2 19 (*)    Glucose, Bld 213 (*)    BUN 103 (*)    Creatinine, Ser 2.45 (*)    Calcium 8.3 (*)    Total Protein 6.1 (*)    Albumin 3.1 (*)    GFR, Estimated 28 (*)    All other components within normal limits  CBC WITH DIFFERENTIAL/PLATELET - Abnormal; Notable for the following components:   WBC 13.3 (*)    RBC 2.05 (*)    Hemoglobin 6.4 (*)    HCT 19.6 (*)    Neutro Abs 10.6 (*)    All other components within normal limits  BRAIN NATRIURETIC PEPTIDE - Abnormal; Notable for the following components:   B Natriuretic Peptide 217.6 (*)    All other components within normal limits  PROTIME-INR - Abnormal; Notable for the following components:   Prothrombin Time 21.8 (*)    INR 1.9 (*)    All other components within normal limits  I-STAT VENOUS BLOOD GAS, ED - Abnormal; Notable for the following components:   pH, Ven 7.244 (*)    pO2, Ven 126 (*)    Bicarbonate 19.5 (*)    TCO2 21 (*)    Acid-base deficit 7.0 (*)    HCT 21.0 (*)    Hemoglobin 7.1 (*)    All other components within normal limits  APTT  TYPE AND SCREEN  PREPARE RBC (CROSSMATCH)  TROPONIN I (HIGH SENSITIVITY)  TROPONIN I (HIGH SENSITIVITY)    EKG None  Radiology CT Head Wo Contrast  Result Date: 12/28/2022 CLINICAL DATA:  Ataxia, head trauma with pain. EXAM: CT HEAD WITHOUT CONTRAST TECHNIQUE: Contiguous axial images were obtained from the base of the skull through the vertex without intravenous contrast. RADIATION DOSE REDUCTION: This exam was performed according to the departmental dose-optimization program which includes automated exposure control, adjustment of the mA and/or kV according to patient size and/or use of iterative reconstruction technique. COMPARISON:  None Available. 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 There is mild cerebral volume loss with associated ex vacuo dilatation. No mass effect or  midline shift. The basilar cisterns are patent. Periventricular white matter hypoattenuation likely represents chronic small vessel ischemic disease. Vascular: There are vascular calcifications in the carotid siphons. Skull: Normal. Negative for fracture or focal lesion. Sinuses/Orbits: There is mild left sphenoid sinus disease. Other: None. 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 If clinically warranted, MRI brain could be performed for further characterization. Electronically Signed   By: Romona Curls M.D.   On: 12/28/2022 14:10   CT CHEST ABDOMEN PELVIS W CONTRAST  Result Date: 12/28/2022 CLINICAL DATA:  Poly trauma, blunt EXAM: CT CHEST, ABDOMEN, AND PELVIS WITH CONTRAST TECHNIQUE: Multidetector CT imaging of the chest, abdomen and pelvis was performed following the standard protocol during bolus administration of intravenous contrast. RADIATION DOSE REDUCTION: This exam was performed according to the departmental dose-optimization program which includes automated exposure control, adjustment of the mA and/or kV according to patient size and/or use of iterative reconstruction technique. CONTRAST:  75mL OMNIPAQUE IOHEXOL 350 MG/ML SOLN COMPARISON:  Recent prior esophagram 12/23/2022; prior CT scan of the abdomen and pelvis 07/07/2016; prior CT scan of the chest 09/16/2017 FINDINGS: CT CHEST FINDINGS Cardiovascular: Conventional 3 vessel arch anatomy. No evidence of aortic aneurysm or dissection. 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 The heart is normal in size. Main pulmonary artery is unremarkable. No pericardial effusion.  Mediastinum/Nodes: Unremarkable CT appearance of the thyroid gland. No suspicious mediastinal or hilar adenopathy. No soft tissue mediastinal mass. The thoracic esophagus is unremarkable. Lungs/Pleura: Lungs are clear. No pleural effusion or pneumothorax. Musculoskeletal: No acute fracture or aggressive appearing lytic or blastic osseous lesion. Healed median sternotomy. CT ABDOMEN PELVIS FINDINGS Overall imaging quality of the abdomen and pelvis is moderately limited due to extensive streak artifact from barium throughout the colon which is likely the residual of the recent prior esophagram. Hepatobiliary: Normal hepatic contour and morphology. Gallbladder is unremarkable. No intra or extrahepatic biliary ductal dilatation. Pancreas: Unremarkable. No pancreatic ductal dilatation or surrounding inflammatory changes. Spleen: Normal in size without focal abnormality. Adrenals/Urinary Tract: The adrenal glands are normal. No hydronephrosis, nephrolithiasis or enhancing renal mass. Ureters and bladder are grossly normal. Stomach/Bowel: High attenuation material present throughout the entire colon from the cecum to the anus. No evidence of bowel obstruction. No focal bowel wall thickening. Vascular/Lymphatic: Atherosclerotic calcifications throughout the abdominal aorta. No evidence of aneurysm. No suspicious lymphadenopathy. Reproductive: The prostate gland is unremarkable. Other: No evidence of ascites. No significant abdominal wall hernia. Musculoskeletal: No acute fracture or malalignment. Mild multilevel degenerative changes. IMPRESSION: 1. No evidence of acute injury to the chest, abdomen or pelvis. 2. Aortic and coronary artery atherosclerotic vascular calcifications. 3. Surgical changes of prior median sternotomy with aortic valve replacement and multivessel CABG. 4. Evaluation of the intra-abdominal and pelvic structures is somewhat limited due to extensive streak artifact from high density barium throughout the  colon. 5. Mild multilevel degenerative changes throughout the spine. Electronically Signed   By: Malachy Moan M.D.   On: 12/28/2022 13:28   DG Chest Portable 1 View  Result Date: 12/28/2022 CLINICAL DATA:  Dyspnea. EXAM: PORTABLE CHEST 1 VIEW COMPARISON:  09/15/2022 FINDINGS: The lungs are clear without focal pneumonia, edema, pneumothorax or pleural effusion. The cardiopericardial silhouette is within normal limits for size. No acute bony abnormality. Telemetry leads overlie the chest. IMPRESSION: No active disease. Electronically Signed   By: Kennith Center M.D.   On: 12/28/2022 12:28  Procedures .Critical Care  Performed by: Arletha Pili, DO Authorized by: Arletha Pili, DO   Critical care provider statement:    Critical care time (minutes):  80   Critical care was time spent personally by me on the following activities:  Development of treatment plan with patient or surrogate, discussions with consultants, evaluation of patient's response to treatment, examination of patient, ordering and performing treatments and interventions, ordering and review of laboratory studies, ordering and review of radiographic studies, re-evaluation of patient's condition and review of old charts   Care discussed with: admitting provider       Medications Ordered in ED Medications  pantoprozole (PROTONIX) 80 mg /NS 100 mL infusion (8 mg/hr Intravenous New Bag/Given 12/28/22 1414)  pantoprazole (PROTONIX) injection 40 mg (has no administration in time range)  cefTRIAXone (ROCEPHIN) 1 g in sodium chloride 0.9 % 100 mL IVPB (has no administration in time range)  iohexol (OMNIPAQUE) 350 MG/ML injection 75 mL (75 mLs Intravenous Contrast Given 12/28/22 1240)  0.9 %  sodium chloride infusion (Manually program via Guardrails IV Fluids) ( Intravenous New Bag/Given 12/28/22 1337)  prothrombin complex conc human (KCENTRA) IVPB 3,698 Units (0 Units Intravenous Stopped 12/28/22 1341)  pantoprazole (PROTONIX) 80  mg /NS 100 mL IVPB (0 mg Intravenous Stopped 12/28/22 1414)    ED Course/ Medical Decision Making/ A&P                                 Medical Decision Making Six 55-year-old male here today with weakness, lightheadedness, had a fall 2 days ago.  Differential diagnoses include intra-abdominal bleeding, GI bleed, ACS, intracranial hemorrhage  Plan-patient's EKG shows diffuse ST depressions.  Considered active in this patient as STEMI however looking at the patient, he is profoundly pale, has pale conjunctiva.  My suspicion is that this patient is anemic which is leading to global ischemia.  He is not having any chest pain.  He does endorse feeling short of breath, however that would fit with anemia as well.  CBC ordered, type and screen ordered.  Have a low threshold for transfusing patient.  Will obtain imaging of the patient's chest abdomen pelvis given his fall 2 days ago.  CT imaging of the head ordered.  Reassessment-patient's hemoglobin is 7.1 today on an i-STAT.  Will begin transfusing the patient.  I believe that this is responsible for his symptoms.  Patient is on his way to CT scan.  With melena on digital rectal exam.  Will reverse patient's Eliquis.  Reassessment-no active bleeding occurring on the patient's CT imaging.  It was somewhat limited by the patient having a barium swallow done 4 days ago.  Patient beginning his transfusion.  Protonix ordered.  Will also give Rocephin.  Have placed a consultation for gastroenterology.  Will plan to admit the patient.  Troponin not elevated.  Believe this is demand ischemia secondary to lower GI bleed.  Spoke with gastroenterology.  They agree with plan.  Currently deciding between scoping this evening or tomorrow.  Will admit to hospitalist.  Amount and/or Complexity of Data Reviewed Labs: ordered. Radiology: ordered.  Risk Prescription drug management. Decision regarding hospitalization.           Final Clinical Impression(s) /  ED Diagnoses Final diagnoses:  Lower GI bleed    Rx / DC Orders ED Discharge Orders     None         Anders Simmonds  T, DO 12/28/22 1435

## 2022-12-28 NOTE — ED Notes (Signed)
ED TO INPATIENT HANDOFF REPORT  ED Nurse Name and Phone #:  Marisue Ivan [phone]  X Name/Age/Gender Antonio Cervantes 67 y.o. male Room/Bed: 004C/004C  Code Status   Code Status: Prior  Home/SNF/Other Home Patient oriented to: self, place, time, and situation Is this baseline? Yes   Triage Complete: Triage complete  Chief Complaint GI bleed [K92.2]  Triage Note No notes on file   Allergies No Known Allergies  Level of Care/Admitting Diagnosis ED Disposition     ED Disposition  Admit   Condition  --   Comment  Hospital Area: [facility] [[id number]]  Level of Care: Telemetry Medical [104]  May place patient in observation at [facility] or [facility] if equivalent level of care is available:: Yes  Covid Evaluation: Asymptomatic - no recent exposure (last 10 days) testing not required  Diagnosis: GI bleed [914782]  Admitting Physician: RAI, RIPUDEEP K [[id number]]  Attending Physician: RAI, RIPUDEEP K [[id number]]          B Medical/Surgery History Past Medical History:  Diagnosis Date   Atrial fibrillation and flutter (HCC)    Cardiac arrhythmia due to congenital heart disease    CKD (chronic kidney disease)    Diabetes mellitus without complication (HCC)    Diabetic peripheral neuropathy associated with type 2 diabetes mellitus (HCC)    Heart failure (HCC)    High blood cholesterol    Thyroid disease    Past Surgical History:  Procedure Laterality Date   AORTIC VALVE REPLACEMENT N/A 07/29/2021   Procedure: AORTIC VALVE REPLACEMENT (AVR) USING EDWARDS INSPIRIS AORTIC VALVE SIZE ;  Surgeon: Alleen Borne, MD;  Location: [facility] OR;  Service: Open Heart Surgery;  Laterality: N/A;   CORONARY ARTERY BYPASS GRAFT N/A 07/29/2021   Procedure: CORONARY ARTERY BYPASS GRAFTING (CABG) TIMES FOUR, USING LEFT INTERNAL MAMMARY ARTERY AND RIGHT GREATER SAPHENOUS VEIN HARVESTED ENDOSCOPICALLYY.;  Surgeon: Alleen Borne, MD;  Location: [facility] OR;  Service: Open Heart Surgery;   Laterality: N/A;   RIGHT/LEFT HEART CATH AND CORONARY ANGIOGRAPHY N/A 05/21/2021   Procedure: RIGHT/LEFT HEART CATH AND CORONARY ANGIOGRAPHY;  Surgeon: Corky Crafts, MD;  Location: [facility] INVASIVE CV LAB;  Service: Cardiovascular;  Laterality: N/A;   TEE WITHOUT CARDIOVERSION N/A 07/29/2021   Procedure: TRANSESOPHAGEAL ECHOCARDIOGRAM (TEE);  Surgeon: Alleen Borne, MD;  Location: [facility] OR;  Service: Open Heart Surgery;  Laterality: N/A;     A IV Location/Drains/Wounds Patient Lines/Drains/Airways Status     Active Line/Drains/Airways     Name Placement date Placement time Site Days   Peripheral IV 12/28/22 18 G Anterior;Left Hand 12/28/22  --  Hand  less than 1   Peripheral IV 12/28/22 20 G Anterior;Right Hand 12/28/22  1200  Hand  less than 1            Intake/Output Last 24 hours  Intake/Output Summary (Last 24 hours) at 12/28/2022 1441 Last data filed at 12/28/2022 1417 Gross per 24 hour  Intake 150 ml  Output 800 ml  Net -650 ml    Labs/Imaging Results for orders placed or performed during the hospital encounter of 12/28/22 (from the past 48 hour(s))  Type and screen [facility]     Status: None (Preliminary result)   Collection Time: 12/28/22 11:56 AM  Result Value Ref Range   ABO/RH(D) A POS    Antibody Screen NEG    Sample Expiration 12/31/2022,2359    Unit Number [id number]    Blood Component Type RBC LR  PHER2    Unit division 00    Status of Unit ISSUED    Transfusion Status OK TO TRANSFUSE    Crossmatch Result      Compatible Performed at [facility], [address] [place], [place] [address]    Unit Number [id number]    Blood Component Type RBC LR PHER2    Unit division 00    Status of Unit ALLOCATED    Transfusion Status OK TO TRANSFUSE    Crossmatch Result Compatible   Troponin I (High Sensitivity)     Status: None   Collection Time: 12/28/22 11:58 AM  Result Value Ref Range   Troponin I (High Sensitivity) 14 <18 ng/L     Comment: (NOTE) Elevated high sensitivity troponin I (hsTnI) values and significant  changes across serial measurements may suggest ACS but many other  chronic and acute conditions are known to elevate hsTnI results.  Refer to the "Links" section for chest pain algorithms and additional  guidance. Performed at [facility], [address] [place], [place] [address]   Comprehensive metabolic panel     Status: Abnormal   Collection Time: 12/28/22 11:58 AM  Result Value Ref Range   Sodium 138 135 - 145 mmol/L   Potassium 4.4 3.5 - 5.1 mmol/L   Chloride 106 98 - 111 mmol/L   CO2 19 (L) 22 - 32 mmol/L   Glucose, Bld 213 (H) 70 - 99 mg/dL    Comment: Glucose reference range applies only to samples taken after fasting for at least 8 hours.   BUN 103 (H) 8 - 23 mg/dL   Creatinine, Ser 6.64 (H) 0.61 - 1.24 mg/dL   Calcium 8.3 (L) 8.9 - 10.3 mg/dL   Total Protein 6.1 (L) 6.5 - 8.1 g/dL   Albumin 3.1 (L) 3.5 - 5.0 g/dL   AST 15 15 - 41 U/L   ALT 15 0 - 44 U/L   Alkaline Phosphatase 59 38 - 126 U/L   Total Bilirubin 0.6 0.3 - 1.2 mg/dL   GFR, Estimated 28 (L) >60 mL/min    Comment: (NOTE) Calculated using the CKD-EPI Creatinine Equation (2021)    Anion gap 13 5 - 15    Comment: Performed at [facility], [address] [place], [place] [address]  CBC with Differential     Status: Abnormal   Collection Time: 12/28/22 11:58 AM  Result Value Ref Range   WBC 13.3 (H) 4.0 - 10.5 K/uL   RBC 2.05 (L) 4.22 - 5.81 MIL/uL   Hemoglobin 6.4 (LL) 13.0 - 17.0 g/dL    Comment: REPEATED TO VERIFY THIS CRITICAL RESULT HAS VERIFIED AND BEEN CALLED TO S.PANWAR RN BY CHARLES EDENS ON 09 01 2024 AT 1334, AND HAS BEEN READ BACK.     HCT 19.6 (L) 39.0 - 52.0 %   MCV 95.6 80.0 - 100.0 fL   MCH 31.2 26.0 - 34.0 pg   MCHC 32.7 30.0 - 36.0 g/dL   RDW 42.5 95.6 - 38.7 %   Platelets 184 150 - 400 K/uL    Comment: REPEATED TO VERIFY   nRBC 0.0 0.0 - 0.2 %   Neutrophils Relative % 80 %    Neutro Abs 10.6 (H) 1.7 - 7.7 K/uL   Lymphocytes Relative 11 %   Lymphs Abs 1.5 0.7 - 4.0 K/uL   Monocytes Relative 5 %   Monocytes Absolute 0.7 0.1 - 1.0 K/uL   Eosinophils Relative 2 %  Eosinophils Absolute 0.3 0.0 - 0.5 K/uL   Basophils Relative 1 %   Basophils Absolute 0.1 0.0 - 0.1 K/uL   Immature Granulocytes 1 %   Abs Immature Granulocytes 0.07 0.00 - 0.07 K/uL    Comment: Performed at [facility], [address], [place], [place] [address]  Brain natriuretic peptide     Status: Abnormal   Collection Time: 12/28/22 11:58 AM  Result Value Ref Range   B Natriuretic Peptide 217.6 (H) 0.0 - 100.0 pg/mL    Comment: Performed at [facility], [address] [place], [place] [address]  Protime-INR     Status: Abnormal   Collection Time: 12/28/22 11:58 AM  Result Value Ref Range   Prothrombin Time 21.8 (H) 11.4 - 15.2 seconds   INR 1.9 (H) 0.8 - 1.2    Comment: (NOTE) INR goal varies based on device and disease states. Performed at [facility], [address] [place], [place] [address]   APTT     Status: None   Collection Time: 12/28/22 11:58 AM  Result Value Ref Range   aPTT 30 24 - 36 seconds    Comment: Performed at [facility], [address], [place], [place] [address]  I-Stat venous blood gas, [facility], [facility], [facility])     Status: Abnormal   Collection Time: 12/28/22 12:37 PM  Result Value Ref Range   pH, Ven 7.244 (L) 7.25 - 7.43   pCO2, Ven 45.1 44 - 60 mmHg   pO2, Ven 126 (H) 32 - 45 mmHg   Bicarbonate 19.5 (L) 20.0 - 28.0 mmol/L   TCO2 21 (L) 22 - 32 mmol/L   O2 Saturation 98 %   Acid-base deficit 7.0 (H) 0.0 - 2.0 mmol/L   Sodium 139 135 - 145 mmol/L   Potassium 4.5 3.5 - 5.1 mmol/L   Calcium, Ion 1.17 1.15 - 1.40 mmol/L   HCT 21.0 (L) 39.0 - 52.0 %   Hemoglobin 7.1 (L) 13.0 - 17.0 g/dL   Sample type VENOUS   Prepare RBC     Status: None   Collection Time: 12/28/22 12:43 PM  Result Value Ref Range   Order Confirmation      ORDERS  RECEIVED TO CROSSMATCH Performed at [facility], [address], [place], [place] [address]    CT Head Wo Contrast  Result Date: 12/28/2022 CLINICAL DATA:  Ataxia, head trauma with pain. EXAM: CT HEAD WITHOUT CONTRAST TECHNIQUE: Contiguous axial images were obtained from the base of the skull through the vertex without intravenous contrast. RADIATION DOSE REDUCTION: This exam was performed according to the departmental dose-optimization program which includes automated exposure control, adjustment of the mA and/or kV according to patient size and/or use of iterative reconstruction technique. COMPARISON:  None Available. 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 There is mild cerebral volume loss with associated ex vacuo dilatation. No mass effect or midline shift. The basilar cisterns are patent. Periventricular white matter hypoattenuation likely represents chronic small vessel ischemic disease. Vascular: There are vascular calcifications in the carotid siphons. Skull: Normal. Negative for fracture or focal lesion. Sinuses/Orbits: There is mild left sphenoid sinus disease. Other: None. 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 If clinically warranted, MRI brain could be performed for further characterization. Electronically Signed   By: Foye Spurling.D.  On: 12/28/2022 14:10   CT CHEST ABDOMEN PELVIS W CONTRAST  Result Date: 12/28/2022 CLINICAL DATA:  Poly trauma, blunt EXAM: CT CHEST, ABDOMEN, AND PELVIS WITH CONTRAST TECHNIQUE: Multidetector CT imaging of the chest, abdomen and pelvis was performed following the standard protocol during bolus administration of intravenous contrast. RADIATION DOSE REDUCTION: This exam was performed according to the departmental dose-optimization program which  includes automated exposure control, adjustment of the mA and/or kV according to patient size and/or use of iterative reconstruction technique. CONTRAST:  75mL OMNIPAQUE IOHEXOL 350 MG/ML SOLN COMPARISON:  Recent prior esophagram 12/23/2022; prior CT scan of the abdomen and pelvis 07/07/2016; prior CT scan of the chest 09/16/2017 FINDINGS: CT CHEST FINDINGS Cardiovascular: Conventional 3 vessel arch anatomy. No evidence of aortic aneurysm or dissection. 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 The heart is normal in size. Main pulmonary artery is unremarkable. No pericardial effusion. Mediastinum/Nodes: Unremarkable CT appearance of the thyroid gland. No suspicious mediastinal or hilar adenopathy. No soft tissue mediastinal mass. The thoracic esophagus is unremarkable. Lungs/Pleura: Lungs are clear. No pleural effusion or pneumothorax. Musculoskeletal: No acute fracture or aggressive appearing lytic or blastic osseous lesion. Healed median sternotomy. CT ABDOMEN PELVIS FINDINGS Overall imaging quality of the abdomen and pelvis is moderately limited due to extensive streak artifact from barium throughout the colon which is likely the residual of the recent prior esophagram. Hepatobiliary: Normal hepatic contour and morphology. Gallbladder is unremarkable. No intra or extrahepatic biliary ductal dilatation. Pancreas: Unremarkable. No pancreatic ductal dilatation or surrounding inflammatory changes. Spleen: Normal in size without focal abnormality. Adrenals/Urinary Tract: The adrenal glands are normal. No hydronephrosis, nephrolithiasis or enhancing renal mass. Ureters and bladder are grossly normal. Stomach/Bowel: High attenuation material present throughout the entire colon from the cecum to the anus. No evidence of bowel obstruction. No focal bowel wall thickening. Vascular/Lymphatic: Atherosclerotic calcifications throughout the abdominal aorta. No  evidence of aneurysm. No suspicious lymphadenopathy. Reproductive: The prostate gland is unremarkable. Other: No evidence of ascites. No significant abdominal wall hernia. Musculoskeletal: No acute fracture or malalignment. Mild multilevel degenerative changes. IMPRESSION: 1. No evidence of acute injury to the chest, abdomen or pelvis. 2. Aortic and coronary artery atherosclerotic vascular calcifications. 3. Surgical changes of prior median sternotomy with aortic valve replacement and multivessel CABG. 4. Evaluation of the intra-abdominal and pelvic structures is somewhat limited due to extensive streak artifact from high density barium throughout the colon. 5. Mild multilevel degenerative changes throughout the spine. Electronically Signed   By: Malachy Moan M.D.   On: 12/28/2022 13:28   DG Chest Portable 1 View  Result Date: 12/28/2022 CLINICAL DATA:  Dyspnea. EXAM: PORTABLE CHEST 1 VIEW COMPARISON:  09/15/2022 FINDINGS: The lungs are clear without focal pneumonia, edema, pneumothorax or pleural effusion. The cardiopericardial silhouette is within normal limits for size. No acute bony abnormality. Telemetry leads overlie the chest. IMPRESSION: No active disease. Electronically Signed   By: Kennith Center M.D.   On: 12/28/2022 12:28    Pending Labs Unresulted Labs (From admission, onward)    None       Vitals/Pain Today's Vitals   12/28/22 1338 12/28/22 1345 12/28/22 1402 12/28/22 1415  BP: 104/64 118/79 113/61 (!) 105/53  Pulse: 88 87 85 82  Resp: 14 15 19    Temp: 97.8 F (36.6 C)  97.9 F (36.6 C)   TempSrc: Oral  Oral   SpO2: 100% 100% 99% 100%  Weight:      Height:  PainSc:        Isolation Precautions No active isolations  Medications Medications  pantoprozole (PROTONIX) 80 mg /NS 100 mL infusion (8 mg/hr Intravenous New Bag/Given 12/28/22 1414)  pantoprazole (PROTONIX) injection 40 mg (has no administration in time range)  cefTRIAXone (ROCEPHIN) 1 g in sodium chloride  0.9 % 100 mL IVPB (has no administration in time range)  iohexol (OMNIPAQUE) 350 MG/ML injection 75 mL (75 mLs Intravenous Contrast Given 12/28/22 1240)  0.9 %  sodium chloride infusion (Manually program via Guardrails IV Fluids) ( Intravenous New Bag/Given 12/28/22 1337)  prothrombin complex conc human (KCENTRA) IVPB 3,698 Units (0 Units Intravenous Stopped 12/28/22 1341)  pantoprazole (PROTONIX) 80 mg /NS 100 mL IVPB (0 mg Intravenous Stopped 12/28/22 1414)    Mobility walks     Focused Assessments Cardiac Assessment Handoff:  Cardiac Rhythm: Normal sinus rhythm No results found for: "CKTOTAL", "CKMB", "CKMBINDEX", "TROPONINI" Lab Results  Component Value Date   DDIMER 1.15 (H) 07/11/2022   Does the Patient currently have chest pain? No    R Recommendations: See Admitting Provider Note  Report given to:   Additional Notes:  Pt did complain of tightness in his chest. I did notify the MD

## 2022-12-28 NOTE — H&P (Signed)
History and Physical  Patient: Antonio Cervantes:[id number] DOB: 1955/05/09 DOA: 12/28/2022 DOS: the patient was seen and examined on 12/28/2022 Patient coming from: Home  Chief Complaint:  Chief Complaint  Patient presents with   Dizziness    Pt coming from home via EMS with a few days of increased weakness and feeling dizzy. Pt denies any chest pain or shob.    HPI: Antonio Cervantes is a 67 y.o. male with PMH significant of coronary artery disease status post CABG in 07/2021, aortic valve replacement in 07/2021, atrial fibrillation on Eliquis, diastolic CHF, DM type II, HTN, HLP hypothyroidism, GERD, CKD, gout presented to ED with worsening generalized weakness, dizzy and lightheaded.  His was obtained from the patient, son and daughter-in-law at the bedside.  Patient had a fall 2 days ago at his doctor's office and had struck his head.  Over the last 2 days he has been feeling very weak, dizzy and lightheaded, unable to ambulate on his own and needed assistance from his family.  He also had a mild substernal chest pain today and felt short of breath this morning.  Denied any nausea vomiting, hematemesis, hematochezia or melena.   Per patient's daughter-in-law, he had a dry cough and more short of breath last week, spoke to his cardiologist office and was recommended to take Lasix extra for 3 days.  Patient is followed by CHF clinic, Dr. Gala Romney.  She also mentioned that he had acute gout flare last week and took indomethacin for 3 days.  Per patient, indomethacin helps with his gout flare, prednisone typically causes hyperglycemia, so prefers indomethacin.   ED course:  In ED, temp 97.8, RR 12, pulse 77, BP 97/70, SBP was noted to be in 80s during my encounter. Sodium 138, potassium 4.4, BUN 103, creatinine 2.45.  Creatinine was 2.90 on 11/1025/24  Hemoglobin 6.4, hematocrit 19.6.  Hemoglobin was 10.0 on 12/23/2022 CT head showed small volume area of hypoattenuation of the right occipital  cortex favored to reflect a chronic infarct/encephalomalacia.  No bleed Chest abdomen pelvis showed no acute injury  GI was consulted by EDP, started on 2 units transfusion, IV PPI, received IV Rocephin x 1  Review of Systems: As mentioned in the history of present illness. All other systems reviewed and are negative. Past Medical History:  Diagnosis Date   Atrial fibrillation and flutter (HCC)    Cardiac arrhythmia due to congenital heart disease    CKD (chronic kidney disease)    Diabetes mellitus without complication (HCC)    Diabetic peripheral neuropathy associated with type 2 diabetes mellitus (HCC)    Heart failure (HCC)    High blood cholesterol    Thyroid disease    Past Surgical History:  Procedure Laterality Date   AORTIC VALVE REPLACEMENT N/A 07/29/2021   Procedure: AORTIC VALVE REPLACEMENT (AVR) USING EDWARDS INSPIRIS AORTIC VALVE SIZE ;  Surgeon: Alleen Borne, MD;  Location: [facility] OR;  Service: Open Heart Surgery;  Laterality: N/A;   CORONARY ARTERY BYPASS GRAFT N/A 07/29/2021   Procedure: CORONARY ARTERY BYPASS GRAFTING (CABG) TIMES FOUR, USING LEFT INTERNAL MAMMARY ARTERY AND RIGHT GREATER SAPHENOUS VEIN HARVESTED ENDOSCOPICALLYY.;  Surgeon: Alleen Borne, MD;  Location: [facility] OR;  Service: Open Heart Surgery;  Laterality: N/A;   RIGHT/LEFT HEART CATH AND CORONARY ANGIOGRAPHY N/A 05/21/2021   Procedure: RIGHT/LEFT HEART CATH AND CORONARY ANGIOGRAPHY;  Surgeon: Corky Crafts, MD;  Location: [facility] INVASIVE CV LAB;  Service: Cardiovascular;  Laterality: N/A;   TEE WITHOUT  CARDIOVERSION N/A 07/29/2021   Procedure: TRANSESOPHAGEAL ECHOCARDIOGRAM (TEE);  Surgeon: Alleen Borne, MD;  Location: [facility] OR;  Service: Open Heart Surgery;  Laterality: N/A;   Social History:  reports that he quit smoking about 41 years ago. His smoking use included cigarettes. He started smoking about 56 years ago. He has a 22.5 pack-year smoking history. He has never used smokeless tobacco. He reports  that he does not drink alcohol and does not use drugs. No Known Allergies Family History  Problem Relation Age of Onset   Diabetes Mother    Diabetes Father    Throat cancer Brother    Heart attack Maternal Uncle    Sudden Cardiac Death Neg Hx    Liver disease Neg Hx    Colon cancer Neg Hx    Prior to Admission medications   Medication Sig Start Date End Date Taking? Authorizing Provider  allopurinol (ZYLOPRIM) 100 MG tablet Take 200 mg by mouth daily. Patient not taking: Reported on 09/05/2022    [provider]  amoxicillin (AMOXIL) 500 MG tablet Take 4 tablets by mouth one hour prior to dental appointments 07/02/22   Corky Crafts, MD  apixaban (ELIQUIS) 5 MG TABS tablet TAKE 1 TABLET BY MOUTH TWICE A DAY 08/04/22   Bensimhon, Bevelyn Buckles, MD  aspirin 81 MG EC tablet Take 1 tablet (81 mg total) by mouth daily. Swallow whole. 05/23/21   Arty Baumgartner, NP  atorvastatin (LIPITOR) 80 MG tablet Take 1 tablet (80 mg total) by mouth daily. 05/24/21   Burnadette Pop, MD  dapagliflozin propanediol (FARXIGA) 10 MG TABS tablet Take 1 tablet (10 mg total) by mouth daily before breakfast. 09/05/22   Milford, Anderson Malta, FNP  ezetimibe (ZETIA) 10 MG tablet Take 1 tablet (10 mg total) by mouth daily. Patient not taking: Reported on 12/17/2022 04/02/22   Dyann Kief, PA-C  furosemide (LASIX) 20 MG tablet Take 20 mg by mouth daily.    [provider]  glipiZIDE (GLUCOTROL) 5 MG tablet Take 5 mg by mouth daily. 09/01/22   [provider]  levothyroxine (SYNTHROID) 112 MCG tablet Take 112 mcg by mouth daily. 02/22/22   [provider]  metoprolol succinate (TOPROL-XL) 25 MG 24 hr tablet Take 1 tablet (25 mg total) by mouth daily. 09/05/22   Milford, Anderson Malta, FNP  pantoprazole (PROTONIX) 40 MG tablet Take 1 tablet (40 mg total) by mouth daily. 12/17/22   Doree Albee, PA-C   Physical Exam: Vitals:   12/28/22 1345 12/28/22 1402 12/28/22 1415 12/28/22 1446  BP:  118/79 113/61 (!) 105/53 (!) 104/57  Pulse: 87 85 82   Resp: 15 19    Temp:  97.9 F (36.6 C)    TempSrc:  Oral    SpO2: 100% 99% 100%   Weight:      Height:         General: Alert, awake, oriented x3, NAD, feeling cold, ill-appearing Eyes: pale conjunctiva, anicteric sclera, PERLA HEENT: normocephalic, atraumatic, oropharynx clear Neck: supple, no masses or lymphadenopathy, no JVD CVS: Regular rate and rhythm, no murmurs, rubs or gallops. Resp : Clear to auscultation bilaterally, no wheezing, rales or rhonchi. GI : Soft, nontender, nondistended, positive bowel sounds. No hepatomegaly.  Ext: No lower extremity edema  Musculoskeletal: No clubbing or cyanosis, positive pedal pulses. No contracture. ROM intact  Neuro: Grossly intact, no focal neurological deficits, no new FND's Psych: alert and oriented x 3, normal mood and affect Skin: no rashes or lesions,  warm and dry   Data Reviewed: I have reviewed ED notes, Vitals, Lab results and outpatient records.   Recent Labs  Lab 12/23/22 1101 12/28/22 1158 12/28/22 1237  NA 141 138 139  K 5.3* 4.4 4.5  CL 102 106  --   CO2 26 19*  --   GLUCOSE 214* 213*  --   BUN 60* 103*  --   CREATININE 2.90* 2.45*  --   CALCIUM 9.9 8.3*  --    Recent Labs  Lab 12/23/22 1101 12/28/22 1158 12/28/22 1237  WBC 14.3* 13.3*  --   NEUTROABS 10.2* 10.6*  --   HGB 10.0* 6.4* 7.1*  HCT 30.8* 19.6* 21.0*  MCV 94.4 95.6  --   PLT 298.0 184  --     Assessment and Plan Principal Problem: Acute blood loss anemia superimposed on chronic anemia  GI bleed -In the setting of eliquis and aspirin, recent NSAID/indomethacin use for gout, has history of GERD -Placed on n.p.o. status, started on IV Protonix drip, received Rocephin IV x 1 in ED -GI has been consulted, will follow recommendations regarding endoscopy -Obtain serial H&H, ordered 2 units packed RBCs -Received Kcentra x 1 in ED  Active Problems: Near syncope, generalized  weakness -Likely due to #1, hypotension -CT head chest and abdomen showed no bleeding or acute pathology -PT OT evaluation once medically stable  Coronary disease status post CABG x4, status post AVR Chronic systolic CHF -2D echo 06/2022 had shown EF of 55 to 60%, mildly reduced right ventricular systolic function, AVR -Outpatient on aspirin, Eliquis, Lipitor, Farxiga, Lasix -Mild chest pain, likely due to acute anemia, troponin negative -Follow serial troponins -BP currently soft, BNP 217, hold Lasix   Paroxysmal atrial fibrillation (HCC) -Not on any rate controlling meds -Hold Eliquis    DM (diabetes mellitus), type 2 (HCC) -Outpatient on Farxiga, glipizide -Placed on sliding scale insulin while inpatient -Hold oral hypoglycemics -Hemoglobin A1c 8.0 on 12/05/2022    Hyperlipidemia -N.p.o., hold statin for now  Chronic kidney disease stage IIIb -Baseline creatinine 2.1-2.2, creatinine however was 2.9 on 12/23/2022 -Currently improving, 2.4  Hypothyroidism -On Synthroid 112 mcg daily, will continue -TSH 11.13 on 07/11/2022, T4 0.9, T3 73 -Will recheck TSH in a.m.      Advance Care Planning:   Code Status: Full Code discussed with the patient Consults: Gastroenterology Family Communication: Discussed with patient's daughter-in-law, son and sister-in-law at the bedside Severity of Illness:      The appropriate patient status for this patient is OBSERVATION. Observation status is judged to be reasonable and necessary in order to provide the required intensity of service to ensure the patient's safety. The patient's presenting symptoms, physical exam findings, and initial radiographic and laboratory data in the context of their medical condition is felt to place them at decreased risk for further clinical deterioration. Furthermore, it is anticipated that the patient will be medically stable for discharge from the hospital within 2 midnights of admission.     Author: Thad Ranger,  MD 12/28/2022 3:09 PM For on call review [email or web address].

## 2022-12-28 NOTE — ED Notes (Signed)
ED TO INPATIENT HANDOFF REPORT  ED Nurse Name and Phone #: Eber Ferrufino/ [phone]  S Name/Age/Gender Antonio Cervantes 67 y.o. male Room/Bed: 004C/004C  Code Status   Code Status: Full Code  Home/SNF/Other Home Patient oriented to: self, place, time, and situation Is this baseline? Yes   Triage Complete: Triage complete  Chief Complaint GI bleed [K92.2]  Triage Note No notes on file   Allergies No Known Allergies  Level of Care/Admitting Diagnosis ED Disposition     ED Disposition  Admit   Condition  --   Comment  Hospital Area: [facility] [[id number]]  Level of Care: Progressive [102]  Admit to Progressive based on following criteria: GI, ENDOCRINE disease patients with GI bleeding, acute liver failure or pancreatitis, stable with diabetic ketoacidosis or thyrotoxicosis (hypothyroid) state.  May place patient in observation at [facility] or [facility] if equivalent level of care is available:: Yes  Covid Evaluation: Asymptomatic - no recent exposure (last 10 days) testing not required  Diagnosis: GI bleed [191478]  Admitting Physician: RAI, RIPUDEEP K [[id number]]  Attending Physician: RAI, RIPUDEEP K [[id number]]          B Medical/Surgery History Past Medical History:  Diagnosis Date   Atrial fibrillation and flutter (HCC)    Cardiac arrhythmia due to congenital heart disease    CKD (chronic kidney disease)    Diabetes mellitus without complication (HCC)    Diabetic peripheral neuropathy associated with type 2 diabetes mellitus (HCC)    Heart failure (HCC)    High blood cholesterol    Thyroid disease    Past Surgical History:  Procedure Laterality Date   AORTIC VALVE REPLACEMENT N/A 07/29/2021   Procedure: AORTIC VALVE REPLACEMENT (AVR) USING EDWARDS INSPIRIS AORTIC VALVE SIZE ;  Surgeon: Alleen Borne, MD;  Location: [facility] OR;  Service: Open Heart Surgery;  Laterality: N/A;   CORONARY ARTERY BYPASS GRAFT N/A 07/29/2021   Procedure: CORONARY ARTERY  BYPASS GRAFTING (CABG) TIMES FOUR, USING LEFT INTERNAL MAMMARY ARTERY AND RIGHT GREATER SAPHENOUS VEIN HARVESTED ENDOSCOPICALLYY.;  Surgeon: Alleen Borne, MD;  Location: [facility] OR;  Service: Open Heart Surgery;  Laterality: N/A;   RIGHT/LEFT HEART CATH AND CORONARY ANGIOGRAPHY N/A 05/21/2021   Procedure: RIGHT/LEFT HEART CATH AND CORONARY ANGIOGRAPHY;  Surgeon: Corky Crafts, MD;  Location: [facility] INVASIVE CV LAB;  Service: Cardiovascular;  Laterality: N/A;   TEE WITHOUT CARDIOVERSION N/A 07/29/2021   Procedure: TRANSESOPHAGEAL ECHOCARDIOGRAM (TEE);  Surgeon: Alleen Borne, MD;  Location: [facility] OR;  Service: Open Heart Surgery;  Laterality: N/A;     A IV Location/Drains/Wounds Patient Lines/Drains/Airways Status     Active Line/Drains/Airways     Name Placement date Placement time Site Days   Peripheral IV 12/28/22 18 G Anterior;Left Hand 12/28/22  --  Hand  less than 1   Peripheral IV 12/28/22 20 G Anterior;Right Hand 12/28/22  1200  Hand  less than 1            Intake/Output Last 24 hours  Intake/Output Summary (Last 24 hours) at 12/28/2022 1550 Last data filed at 12/28/2022 1417 Gross per 24 hour  Intake 150 ml  Output 800 ml  Net -650 ml    Labs/Imaging Results for orders placed or performed during the hospital encounter of 12/28/22 (from the past 48 hour(s))  Type and screen [facility]     Status: None (Preliminary result)   Collection Time: 12/28/22 11:56 AM  Result Value Ref Range   ABO/RH(D) A  POS    Antibody Screen NEG    Sample Expiration 12/31/2022,2359    Unit Number [id number]    Blood Component Type RBC LR PHER2    Unit division 00    Status of Unit ISSUED    Transfusion Status OK TO TRANSFUSE    Crossmatch Result      Compatible Performed at [facility] Lab, [address], [place], [place] [address]    Unit Number [id number]    Blood Component Type RBC LR PHER2    Unit division 00    Status of Unit ALLOCATED    Transfusion  Status OK TO TRANSFUSE    Crossmatch Result Compatible   Troponin I (High Sensitivity)     Status: None   Collection Time: 12/28/22 11:58 AM  Result Value Ref Range   Troponin I (High Sensitivity) 14 <18 ng/L    Comment: (NOTE) Elevated high sensitivity troponin I (hsTnI) values and significant  changes across serial measurements may suggest ACS but many other  chronic and acute conditions are known to elevate hsTnI results.  Refer to the "Links" section for chest pain algorithms and additional  guidance. Performed at [facility] Lab, [address], [place], [place] [address]   Comprehensive metabolic panel     Status: Abnormal   Collection Time: 12/28/22 11:58 AM  Result Value Ref Range   Sodium 138 135 - 145 mmol/L   Potassium 4.4 3.5 - 5.1 mmol/L   Chloride 106 98 - 111 mmol/L   CO2 19 (L) 22 - 32 mmol/L   Glucose, Bld 213 (H) 70 - 99 mg/dL    Comment: Glucose reference range applies only to samples taken after fasting for at least 8 hours.   BUN 103 (H) 8 - 23 mg/dL   Creatinine, Ser 6.29 (H) 0.61 - 1.24 mg/dL   Calcium 8.3 (L) 8.9 - 10.3 mg/dL   Total Protein 6.1 (L) 6.5 - 8.1 g/dL   Albumin 3.1 (L) 3.5 - 5.0 g/dL   AST 15 15 - 41 U/L   ALT 15 0 - 44 U/L   Alkaline Phosphatase 59 38 - 126 U/L   Total Bilirubin 0.6 0.3 - 1.2 mg/dL   GFR, Estimated 28 (L) >60 mL/min    Comment: (NOTE) Calculated using the CKD-EPI Creatinine Equation (2021)    Anion gap 13 5 - 15    Comment: Performed at [facility] Lab, [address], [place], [place] [address]  CBC with Differential     Status: Abnormal   Collection Time: 12/28/22 11:58 AM  Result Value Ref Range   WBC 13.3 (H) 4.0 - 10.5 K/uL   RBC 2.05 (L) 4.22 - 5.81 MIL/uL   Hemoglobin 6.4 (LL) 13.0 - 17.0 g/dL    Comment: REPEATED TO VERIFY THIS CRITICAL RESULT HAS VERIFIED AND BEEN CALLED TO S.PANWAR RN BY CHARLES EDENS ON 09 01 2024 AT 1334, AND HAS BEEN READ BACK.     HCT 19.6 (L) 39.0 - 52.0 %   MCV 95.6 80.0 -  100.0 fL   MCH 31.2 26.0 - 34.0 pg   MCHC 32.7 30.0 - 36.0 g/dL   RDW 32.4 40.1 - 02.7 %   Platelets 184 150 - 400 K/uL    Comment: REPEATED TO VERIFY   nRBC 0.0 0.0 - 0.2 %   Neutrophils Relative % 80 %   Neutro Abs 10.6 (H) 1.7 - 7.7 K/uL   Lymphocytes Relative 11 %   Lymphs Abs 1.5  0.7 - 4.0 K/uL   Monocytes Relative 5 %   Monocytes Absolute 0.7 0.1 - 1.0 K/uL   Eosinophils Relative 2 %   Eosinophils Absolute 0.3 0.0 - 0.5 K/uL   Basophils Relative 1 %   Basophils Absolute 0.1 0.0 - 0.1 K/uL   Immature Granulocytes 1 %   Abs Immature Granulocytes 0.07 0.00 - 0.07 K/uL    Comment: Performed at [facility] Lab, [address], [place], [place] [address]  Brain natriuretic peptide     Status: Abnormal   Collection Time: 12/28/22 11:58 AM  Result Value Ref Range   B Natriuretic Peptide 217.6 (H) 0.0 - 100.0 pg/mL    Comment: Performed at [facility] Lab, [address], [place], [place] [address]  Protime-INR     Status: Abnormal   Collection Time: 12/28/22 11:58 AM  Result Value Ref Range   Prothrombin Time 21.8 (H) 11.4 - 15.2 seconds   INR 1.9 (H) 0.8 - 1.2    Comment: (NOTE) INR goal varies based on device and disease states. Performed at [facility] Lab, [address] [place], [place] [address]   APTT     Status: None   Collection Time: 12/28/22 11:58 AM  Result Value Ref Range   aPTT 30 24 - 36 seconds    Comment: Performed at [facility] Lab, [address] [place], [place] [address]  I-Stat venous blood gas, [facility], [facility], [facility])     Status: Abnormal   Collection Time: 12/28/22 12:37 PM  Result Value Ref Range   pH, Ven 7.244 (L) 7.25 - 7.43   pCO2, Ven 45.1 44 - 60 mmHg   pO2, Ven 126 (H) 32 - 45 mmHg   Bicarbonate 19.5 (L) 20.0 - 28.0 mmol/L   TCO2 21 (L) 22 - 32 mmol/L   O2 Saturation 98 %   Acid-base deficit 7.0 (H) 0.0 - 2.0 mmol/L   Sodium 139 135 - 145 mmol/L   Potassium 4.5 3.5 - 5.1 mmol/L   Calcium, Ion 1.17 1.15 - 1.40 mmol/L   HCT  21.0 (L) 39.0 - 52.0 %   Hemoglobin 7.1 (L) 13.0 - 17.0 g/dL   Sample type VENOUS   Prepare RBC     Status: None   Collection Time: 12/28/22 12:43 PM  Result Value Ref Range   Order Confirmation      ORDERS RECEIVED TO CROSSMATCH Performed at [facility] Lab, [address], [place], [place] [address]    CT Head Wo Contrast  Result Date: 12/28/2022 CLINICAL DATA:  Ataxia, head trauma with pain. EXAM: CT HEAD WITHOUT CONTRAST TECHNIQUE: Contiguous axial images were obtained from the base of the skull through the vertex without intravenous contrast. RADIATION DOSE REDUCTION: This exam was performed according to the departmental dose-optimization program which includes automated exposure control, adjustment of the mA and/or kV according to patient size and/or use of iterative reconstruction technique. COMPARISON:  None Available. 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 There is mild cerebral volume loss with associated ex vacuo dilatation. No mass effect or midline shift. The basilar cisterns are patent. Periventricular white matter hypoattenuation likely represents chronic small vessel ischemic disease. Vascular: There are vascular calcifications in the carotid siphons. Skull: Normal. Negative for fracture or focal lesion. Sinuses/Orbits: There is mild left sphenoid sinus disease. Other: None. 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 If clinically warranted, MRI brain could be performed for further characterization. Electronically Signed   By: Romona Curls M.D.   On: 12/28/2022 14:10   CT CHEST ABDOMEN PELVIS W CONTRAST  Result Date: 12/28/2022 CLINICAL DATA:  Poly trauma, blunt EXAM: CT CHEST, ABDOMEN, AND PELVIS WITH CONTRAST TECHNIQUE: Multidetector CT imaging of the chest, abdomen and pelvis  was performed following the standard protocol during bolus administration of intravenous contrast. RADIATION DOSE REDUCTION: This exam was performed according to the departmental dose-optimization program which includes automated exposure control, adjustment of the mA and/or kV according to patient size and/or use of iterative reconstruction technique. CONTRAST:  75mL OMNIPAQUE IOHEXOL 350 MG/ML SOLN COMPARISON:  Recent prior esophagram 12/23/2022; prior CT scan of the abdomen and pelvis 07/07/2016; prior CT scan of the chest 09/16/2017 FINDINGS: CT CHEST FINDINGS Cardiovascular: Conventional 3 vessel arch anatomy. No evidence of aortic aneurysm or dissection. 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 The heart is normal in size. Main pulmonary artery is unremarkable. No pericardial effusion. Mediastinum/Nodes: Unremarkable CT appearance of the thyroid gland. No suspicious mediastinal or hilar adenopathy. No soft tissue mediastinal mass. The thoracic esophagus is unremarkable. Lungs/Pleura: Lungs are clear. No pleural effusion or pneumothorax. Musculoskeletal: No acute fracture or aggressive appearing lytic or blastic osseous lesion. Healed median sternotomy. CT ABDOMEN PELVIS FINDINGS Overall imaging quality of the abdomen and pelvis is moderately limited due to extensive streak artifact from barium throughout the colon which is likely the residual of the recent prior esophagram. Hepatobiliary: Normal hepatic contour and morphology. Gallbladder is unremarkable. No intra or extrahepatic biliary ductal dilatation. Pancreas: Unremarkable. No pancreatic ductal dilatation or surrounding inflammatory changes. Spleen: Normal in size without focal abnormality. Adrenals/Urinary Tract: The adrenal glands are normal. No hydronephrosis, nephrolithiasis or enhancing renal mass. Ureters and bladder are grossly normal. Stomach/Bowel: High attenuation material  present throughout the entire colon from the cecum to the anus. No evidence of bowel obstruction. No focal bowel wall thickening. Vascular/Lymphatic: Atherosclerotic calcifications throughout the abdominal aorta. No evidence of aneurysm. No suspicious lymphadenopathy. Reproductive: The prostate gland is unremarkable. Other: No evidence of ascites. No significant abdominal wall hernia. Musculoskeletal: No acute fracture or malalignment. Mild multilevel degenerative changes. IMPRESSION: 1. No evidence of acute injury to the chest, abdomen or pelvis. 2. Aortic and coronary artery atherosclerotic vascular calcifications. 3. Surgical changes of prior median sternotomy with aortic valve replacement and multivessel CABG. 4. Evaluation of the intra-abdominal and pelvic structures is somewhat limited due to extensive streak artifact from high density barium throughout the colon. 5. Mild multilevel degenerative changes throughout the spine. Electronically Signed   By: Malachy Moan M.D.   On: 12/28/2022 13:28   DG Chest Portable 1 View  Result Date: 12/28/2022 CLINICAL DATA:  Dyspnea. EXAM: PORTABLE CHEST 1 VIEW COMPARISON:  09/15/2022 FINDINGS: The lungs are clear without focal pneumonia, edema, pneumothorax or pleural effusion. The cardiopericardial silhouette is within normal limits for size. No acute bony abnormality. Telemetry leads overlie the chest. IMPRESSION: No active disease. Electronically Signed   By: Kennith Center M.D.   On: 12/28/2022 12:28    Pending Labs [employer] (From admission, onward)     Start     Ordered   Signed and Held  Comprehensive metabolic panel  Tomorrow morning,   R        Signed and Held   Signed and Held  CBC  Tomorrow morning,  R        Signed and Held   Signed and Held  TSH  Tomorrow morning,   R        Signed and Held            Vitals/Pain Today's Vitals   12/28/22 1402 12/28/22 1415 12/28/22 1446 12/28/22 1500  BP: 113/61 (!) 105/53 (!) 104/57 97/70   Pulse: 85 82  79  Resp: 19     Temp: 97.9 F (36.6 C)     TempSrc: Oral     SpO2: 99% 100%  100%  Weight:      Height:      PainSc:        Isolation Precautions No active isolations  Medications Medications  pantoprozole (PROTONIX) 80 mg /NS 100 mL infusion (8 mg/hr Intravenous New Bag/Given 12/28/22 1414)  pantoprazole (PROTONIX) injection 40 mg (has no administration in time range)  cefTRIAXone (ROCEPHIN) 1 g in sodium chloride 0.9 % 100 mL IVPB (1 g Intravenous New Bag/Given 12/28/22 1535)  iohexol (OMNIPAQUE) 350 MG/ML injection 75 mL (75 mLs Intravenous Contrast Given 12/28/22 1240)  0.9 %  sodium chloride infusion (Manually program via Guardrails IV Fluids) ( Intravenous New Bag/Given 12/28/22 1337)  prothrombin complex conc human (KCENTRA) IVPB 3,698 Units (0 Units Intravenous Stopped 12/28/22 1341)  pantoprazole (PROTONIX) 80 mg /NS 100 mL IVPB (0 mg Intravenous Stopped 12/28/22 1414)    Mobility walks with device with a walker      Focused Assessments     R Recommendations: See Admitting Provider Note  Report given to:   Additional Notes:

## 2022-12-28 NOTE — ED Notes (Signed)
Pt complaining of tightness in his chest. Dr. Andria Meuse notified

## 2022-12-29 ENCOUNTER — Inpatient Hospital Stay (HOSPITAL_COMMUNITY): Payer: Medicare Other

## 2022-12-29 DIAGNOSIS — E039 Hypothyroidism, unspecified: Secondary | ICD-10-CM | POA: Diagnosis present

## 2022-12-29 DIAGNOSIS — I5022 Chronic systolic (congestive) heart failure: Secondary | ICD-10-CM

## 2022-12-29 DIAGNOSIS — E7800 Pure hypercholesterolemia, unspecified: Secondary | ICD-10-CM | POA: Diagnosis present

## 2022-12-29 DIAGNOSIS — K224 Dyskinesia of esophagus: Secondary | ICD-10-CM | POA: Diagnosis present

## 2022-12-29 DIAGNOSIS — Z951 Presence of aortocoronary bypass graft: Secondary | ICD-10-CM | POA: Diagnosis not present

## 2022-12-29 DIAGNOSIS — Z7901 Long term (current) use of anticoagulants: Secondary | ICD-10-CM | POA: Diagnosis not present

## 2022-12-29 DIAGNOSIS — K922 Gastrointestinal hemorrhage, unspecified: Secondary | ICD-10-CM

## 2022-12-29 DIAGNOSIS — E785 Hyperlipidemia, unspecified: Secondary | ICD-10-CM | POA: Diagnosis not present

## 2022-12-29 DIAGNOSIS — I214 Non-ST elevation (NSTEMI) myocardial infarction: Secondary | ICD-10-CM

## 2022-12-29 DIAGNOSIS — I255 Ischemic cardiomyopathy: Secondary | ICD-10-CM | POA: Diagnosis not present

## 2022-12-29 DIAGNOSIS — I5020 Unspecified systolic (congestive) heart failure: Secondary | ICD-10-CM | POA: Diagnosis not present

## 2022-12-29 DIAGNOSIS — I21A1 Myocardial infarction type 2: Secondary | ICD-10-CM | POA: Diagnosis present

## 2022-12-29 DIAGNOSIS — Z87891 Personal history of nicotine dependence: Secondary | ICD-10-CM | POA: Diagnosis not present

## 2022-12-29 DIAGNOSIS — D62 Acute posthemorrhagic anemia: Secondary | ICD-10-CM | POA: Diagnosis not present

## 2022-12-29 DIAGNOSIS — I9789 Other postprocedural complications and disorders of the circulatory system, not elsewhere classified: Secondary | ICD-10-CM | POA: Diagnosis not present

## 2022-12-29 DIAGNOSIS — D638 Anemia in other chronic diseases classified elsewhere: Secondary | ICD-10-CM

## 2022-12-29 DIAGNOSIS — I4819 Other persistent atrial fibrillation: Secondary | ICD-10-CM | POA: Diagnosis present

## 2022-12-29 DIAGNOSIS — I2510 Atherosclerotic heart disease of native coronary artery without angina pectoris: Secondary | ICD-10-CM | POA: Diagnosis present

## 2022-12-29 DIAGNOSIS — D72829 Elevated white blood cell count, unspecified: Secondary | ICD-10-CM | POA: Diagnosis present

## 2022-12-29 DIAGNOSIS — K6289 Other specified diseases of anus and rectum: Secondary | ICD-10-CM | POA: Diagnosis not present

## 2022-12-29 DIAGNOSIS — E1169 Type 2 diabetes mellitus with other specified complication: Secondary | ICD-10-CM | POA: Diagnosis not present

## 2022-12-29 DIAGNOSIS — K250 Acute gastric ulcer with hemorrhage: Secondary | ICD-10-CM | POA: Diagnosis present

## 2022-12-29 DIAGNOSIS — W19XXXA Unspecified fall, initial encounter: Secondary | ICD-10-CM | POA: Diagnosis present

## 2022-12-29 DIAGNOSIS — M109 Gout, unspecified: Secondary | ICD-10-CM | POA: Diagnosis present

## 2022-12-29 DIAGNOSIS — R55 Syncope and collapse: Secondary | ICD-10-CM

## 2022-12-29 DIAGNOSIS — K259 Gastric ulcer, unspecified as acute or chronic, without hemorrhage or perforation: Secondary | ICD-10-CM | POA: Diagnosis not present

## 2022-12-29 DIAGNOSIS — Z952 Presence of prosthetic heart valve: Secondary | ICD-10-CM | POA: Diagnosis not present

## 2022-12-29 DIAGNOSIS — I5043 Acute on chronic combined systolic (congestive) and diastolic (congestive) heart failure: Secondary | ICD-10-CM | POA: Diagnosis present

## 2022-12-29 DIAGNOSIS — I130 Hypertensive heart and chronic kidney disease with heart failure and stage 1 through stage 4 chronic kidney disease, or unspecified chronic kidney disease: Secondary | ICD-10-CM | POA: Diagnosis not present

## 2022-12-29 DIAGNOSIS — Z79890 Hormone replacement therapy (postmenopausal): Secondary | ICD-10-CM | POA: Diagnosis not present

## 2022-12-29 DIAGNOSIS — K254 Chronic or unspecified gastric ulcer with hemorrhage: Secondary | ICD-10-CM | POA: Diagnosis present

## 2022-12-29 DIAGNOSIS — E1122 Type 2 diabetes mellitus with diabetic chronic kidney disease: Secondary | ICD-10-CM | POA: Diagnosis present

## 2022-12-29 DIAGNOSIS — N189 Chronic kidney disease, unspecified: Secondary | ICD-10-CM | POA: Diagnosis not present

## 2022-12-29 DIAGNOSIS — N184 Chronic kidney disease, stage 4 (severe): Secondary | ICD-10-CM | POA: Diagnosis present

## 2022-12-29 DIAGNOSIS — D649 Anemia, unspecified: Secondary | ICD-10-CM | POA: Diagnosis not present

## 2022-12-29 DIAGNOSIS — I4891 Unspecified atrial fibrillation: Secondary | ICD-10-CM | POA: Diagnosis not present

## 2022-12-29 DIAGNOSIS — E1142 Type 2 diabetes mellitus with diabetic polyneuropathy: Secondary | ICD-10-CM | POA: Diagnosis present

## 2022-12-29 DIAGNOSIS — I5021 Acute systolic (congestive) heart failure: Secondary | ICD-10-CM | POA: Diagnosis not present

## 2022-12-29 DIAGNOSIS — K2970 Gastritis, unspecified, without bleeding: Secondary | ICD-10-CM | POA: Diagnosis not present

## 2022-12-29 DIAGNOSIS — N179 Acute kidney failure, unspecified: Secondary | ICD-10-CM | POA: Diagnosis not present

## 2022-12-29 DIAGNOSIS — K921 Melena: Secondary | ICD-10-CM | POA: Diagnosis not present

## 2022-12-29 LAB — COMPREHENSIVE METABOLIC PANEL
ALT: 13 U/L (ref 0–44)
AST: 18 U/L (ref 15–41)
Albumin: 3 g/dL — ABNORMAL LOW (ref 3.5–5.0)
Alkaline Phosphatase: 52 U/L (ref 38–126)
Anion gap: 6 (ref 5–15)
BUN: 99 mg/dL — ABNORMAL HIGH (ref 8–23)
CO2: 19 mmol/L — ABNORMAL LOW (ref 22–32)
Calcium: 8.3 mg/dL — ABNORMAL LOW (ref 8.9–10.3)
Chloride: 111 mmol/L (ref 98–111)
Creatinine, Ser: 2.61 mg/dL — ABNORMAL HIGH (ref 0.61–1.24)
GFR, Estimated: 26 mL/min — ABNORMAL LOW (ref >60)
Glucose, Bld: 146 mg/dL — ABNORMAL HIGH (ref 70–99)
Potassium: 4.2 mmol/L (ref 3.5–5.1)
Sodium: 136 mmol/L (ref 135–145)
Total Bilirubin: 0.5 mg/dL (ref 0.3–1.2)
Total Protein: 5.8 g/dL — ABNORMAL LOW (ref 6.5–8.1)

## 2022-12-29 LAB — CBC
HCT: 20.9 % — ABNORMAL LOW (ref 39.0–52.0)
HCT: 23.8 % — ABNORMAL LOW (ref 39.0–52.0)
Hemoglobin: 7.1 g/dL — ABNORMAL LOW (ref 13.0–17.0)
Hemoglobin: 8.1 g/dL — ABNORMAL LOW (ref 13.0–17.0)
MCH: 31.6 pg (ref 26.0–34.0)
MCH: 30.5 pg (ref 26.0–34.0)
MCHC: 34 g/dL (ref 30.0–36.0)
MCHC: 34 g/dL (ref 30.0–36.0)
MCV: 92.9 fL (ref 80.0–100.0)
MCV: 89.5 fL (ref 80.0–100.0)
Platelets: 184 10*3/uL (ref 150–400)
Platelets: 182 10*3/uL (ref 150–400)
RBC: 2.25 MIL/uL — ABNORMAL LOW (ref 4.22–5.81)
RBC: 2.66 MIL/uL — ABNORMAL LOW (ref 4.22–5.81)
RDW: 14.4 % (ref 11.5–15.5)
RDW: 16.6 % — ABNORMAL HIGH (ref 11.5–15.5)
WBC: 15.4 10*3/uL — ABNORMAL HIGH (ref 4.0–10.5)
WBC: 13.6 10*3/uL — ABNORMAL HIGH (ref 4.0–10.5)
nRBC: 0 % (ref 0.0–0.2)
nRBC: 0 % (ref 0.0–0.2)

## 2022-12-29 LAB — ECHOCARDIOGRAM COMPLETE
AR max vel: 1.76 cm2
AV Area VTI: 1.72 cm2
AV Area mean vel: 1.78 cm2
AV Mean grad: 12 mmHg
AV Peak grad: 25 mmHg
Ao pk vel: 2.5 m/s
Area-P 1/2: 3.39 cm2
Height: 66 in
S' Lateral: 3 cm
Weight: 2720 [oz_av]

## 2022-12-29 LAB — GLUCOSE, CAPILLARY
Glucose-Capillary: 128 mg/dL — ABNORMAL HIGH (ref 70–99)
Glucose-Capillary: 133 mg/dL — ABNORMAL HIGH (ref 70–99)
Glucose-Capillary: 141 mg/dL — ABNORMAL HIGH (ref 70–99)
Glucose-Capillary: 141 mg/dL — ABNORMAL HIGH (ref 70–99)
Glucose-Capillary: 142 mg/dL — ABNORMAL HIGH (ref 70–99)
Glucose-Capillary: 85 mg/dL (ref 70–99)

## 2022-12-29 LAB — VITAMIN B12: Vitamin B-12: 198 pg/mL (ref 180–914)

## 2022-12-29 LAB — PREPARE RBC (CROSSMATCH)

## 2022-12-29 LAB — FOLATE: Folate: 6 ng/mL (ref >5.9)

## 2022-12-29 LAB — TROPONIN I (HIGH SENSITIVITY): Troponin I (High Sensitivity): 1420 ng/L (ref <18)

## 2022-12-29 LAB — TSH: TSH: 5.597 u[IU]/mL — ABNORMAL HIGH (ref 0.350–4.500)

## 2022-12-29 MED ORDER — LEVOTHYROXINE SODIUM 112 MCG PO TABS
112.0000 ug | ORAL_TABLET | Freq: Every day | ORAL | Status: DC
  Administered 2022-12-30 – 2023-01-01 (×3): 112 ug via ORAL
  Filled 2022-12-29 (×3): qty 1

## 2022-12-29 MED ORDER — PEG-KCL-NACL-NASULF-NA ASC-C 100 G PO SOLR
0.5000 | Freq: Once | ORAL | Status: AC
  Administered 2022-12-30: 100 g via ORAL
  Filled 2022-12-29: qty 1

## 2022-12-29 MED ORDER — DIPHENHYDRAMINE HCL 25 MG PO CAPS
25.0000 mg | ORAL_CAPSULE | Freq: Once | ORAL | Status: AC
  Administered 2022-12-29: 25 mg via ORAL
  Filled 2022-12-29: qty 1

## 2022-12-29 MED ORDER — PEG-KCL-NACL-NASULF-NA ASC-C 100 G PO SOLR: 1.0000 | Freq: Once | ORAL | Status: DC

## 2022-12-29 MED ORDER — ACETAMINOPHEN 325 MG PO TABS
650.0000 mg | ORAL_TABLET | Freq: Once | ORAL | Status: AC
  Administered 2022-12-29: 650 mg via ORAL
  Filled 2022-12-29: qty 2

## 2022-12-29 MED ORDER — SODIUM CHLORIDE 0.9 % IV SOLN: INTRAVENOUS | Status: DC

## 2022-12-29 MED ORDER — PEG-KCL-NACL-NASULF-NA ASC-C 100 G PO SOLR
1.0000 | Freq: Once | ORAL | Status: DC
  Filled 2022-12-29: qty 1

## 2022-12-29 MED ORDER — PEG-KCL-NACL-NASULF-NA ASC-C 100 G PO SOLR
0.5000 | Freq: Once | ORAL | Status: AC
  Administered 2022-12-29: 100 g via ORAL
  Filled 2022-12-29 (×2): qty 1

## 2022-12-29 MED ORDER — ATORVASTATIN CALCIUM 80 MG PO TABS
80.0000 mg | ORAL_TABLET | Freq: Every day | ORAL | Status: DC
  Administered 2022-12-29 – 2023-01-01 (×3): 80 mg via ORAL
  Filled 2022-12-29 (×3): qty 1

## 2022-12-29 MED ORDER — SODIUM CHLORIDE 0.9% IV SOLUTION: Freq: Once | INTRAVENOUS | Status: AC

## 2022-12-29 NOTE — Plan of Care (Signed)

## 2022-12-29 NOTE — Evaluation (Signed)
Physical Therapy Evaluation/ Discharge Patient Details Name: Antonio Cervantes MRN: [id number] DOB: 10-30-1955 Today's Date: 12/29/2022  History of Present Illness  67 y.o. male admitted 9/1 with anemia, GIB. PMhx: CABG x 4, A-fib on Eliquis, HFpEF, T2DM, neuropathy, AVR  Clinical Impression  Pt very pleasant, lives with his son and is able to walk without AD and care for himself at home. Pt walked in hall with intermittent use of rail when present but not needed and has RW if feeling unsteady at home. Pt reports only one fall at home when he tripped over family member, still mows his own yard. Pt at baseline functional status with assist only for lines and able to walk in hall. No further therapy intervention required at this time with pt aware and agreeable.         If plan is discharge home, recommend the following: Assist for transportation;Help with stairs or ramp for entrance   Can travel by private vehicle        Equipment Recommendations None recommended by PT  Recommendations for Other Services       Functional Status Assessment Patient has not had a recent decline in their functional status     Precautions / Restrictions Precautions Precautions: Fall Restrictions Weight Bearing Restrictions: No      Mobility  Bed Mobility Overal bed mobility: Modified Independent                  Transfers Overall transfer level: Modified independent                      Ambulation/Gait Ambulation/Gait assistance: Supervision Gait Distance (Feet): 500 Feet Assistive device: None Gait Pattern/deviations: Step-through pattern, Decreased stride length   Gait velocity interpretation: >2.62 ft/sec, indicative of community ambulatory   General Gait Details: assist for IV pole and direction to room, pt able to recall room number  Stairs            Wheelchair Mobility     Tilt Bed    Modified Rankin (Stroke Patients Only)       Balance Overall  balance assessment: No apparent balance deficits (not formally assessed)                                           Pertinent Vitals/Pain Pain Assessment Pain Assessment: No/denies pain    Home Living Family/patient expects to be discharged to:: Private residence Living Arrangements: Children;Other relatives Available Help at Discharge: Family;Available 24 hours/day Type of Home: House Home Access: Stairs to enter   [employer] of Steps: 2   Home Layout: Two level;Able to live on main level with bedroom/bathroom Home Equipment: Agricultural consultant (2 wheels)      Prior Function Prior Level of Function : Independent/Modified Independent;Driving             Mobility Comments: doesn't use AD ADLs Comments: independent     Extremity/Trunk Assessment   Upper Extremity Assessment Upper Extremity Assessment: Overall WFL for tasks assessed    Lower Extremity Assessment Lower Extremity Assessment: Overall WFL for tasks assessed    Cervical / Trunk Assessment Cervical / Trunk Assessment: Normal  Communication   Communication Communication: No apparent difficulties  Cognition Arousal: Alert Behavior During Therapy: WFL for tasks assessed/performed Overall Cognitive Status: Within Functional Limits for tasks assessed  General Comments      Exercises     Assessment/Plan    PT Assessment Patient does not need any further PT services  PT Problem List         PT Treatment Interventions      PT Goals (Current goals can be found in the Care Plan section)  Acute Rehab PT Goals PT Goal Formulation: All assessment and education complete, DC therapy    Frequency       Co-evaluation               AM-PAC PT "6 Clicks" Mobility  Outcome Measure Help needed turning from your back to your side while in a flat bed without using bedrails?: None Help needed moving from lying on  your back to sitting on the side of a flat bed without using bedrails?: None Help needed moving to and from a bed to a chair (including a wheelchair)?: A Little Help needed standing up from a chair using your arms (e.g., wheelchair or bedside chair)?: A Little Help needed to walk in hospital room?: A Little Help needed climbing 3-5 steps with a railing? : A Little 6 Click Score: 20    End of Session   Activity Tolerance: Patient tolerated treatment well Patient left: in chair;with call bell/phone within reach;with chair alarm set Nurse Communication: Mobility status PT Visit Diagnosis: Other abnormalities of gait and mobility (R26.89)    Time: 6387-5643 PT Time Calculation (min) (ACUTE ONLY): 17 min   Charges:   PT Evaluation $PT Eval Low Complexity: 1 Low   PT General Charges $$ ACUTE PT VISIT: 1 Visit         Merryl Hacker, PT Acute Rehabilitation Services Office: [phone]   Enedina Finner Mileena Rothenberger 12/29/2022, 2:12 PM

## 2022-12-29 NOTE — Consult Note (Addendum)
Cardiology Consultation   Patient ID: Antonio Cervantes MRN: [id number]; DOB: 01-25-1956  Admit date: 12/28/2022 Date of Consult: 12/29/2022  PCP:  Ellis Parents, FNP   [facility] Providers Cardiologist: Dr. Arvilla Meres   Patient Profile:   Antonio Cervantes is a 67 y.o. 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  History of Present Illness:   Antonio Cervantes with above past medical history presented to the ER 12/28/2022 for weakness, lightheadedness, and shortness of breath.  He reports a fall 2 days ago at his doctor's office striking his head.  He has not been able to ambulate independently due to significant weakness and lightheadedness.  He also had complained of mid substernal chest pain that started 12/28/2022 morning along with shortness of breath. He now states he does not remember he had complained chest pain at ED visit.   He did not notice any significant bleeding at home such as melena.  He was taking indomethacin for gout attack recently. He states he might have had chest pain once over the past 4 days. He feels overall well, no pain, SOB, dizziness at this time.   Admission diagnostic from 12/28/2022 revealed bicarb 19, glucose 213, BUN 103, creatinine 2.45 (was 2.9 on 12/23/22), albumin 3.1, EGFR 28.  BNP elevated 217.  WBC 13 300, hemoglobin 6.4 (dropped from 10 on 12/23/2022 at PCP office).  INR 1.9.  Chest x-ray showed no acute disease.  Subsequent labs showed high sensitive troponin 14 >83> 1420.  TSH elevated 5.59. EKG 12/28/22 reveals sinus rhythm 74 bpm, ST depression noted of inferior lateral leads that appear to be new comparing to 09/23/2022 EKG. CT torso revealed  no acute injury to chest/abdomen/pelvis.  CT head revealed small volume area of hypoattenuation of the right occipital cortex, favored to reflect a chronic infarct/encephalomalacia . He was admitted to hospital medicine service for acute symptomatic anemia, transfused total 3 units PRBC, cardiac medication including aspirin, Eliquis, Farxiga, Lasix held.  GI consulted today, recommending EGD and colonoscopy tomorrow.  Echocardiogram completed today revealing LVEF 60 to 65%, no regional wall motion abnormality, mild concentric LVH, normal diastolic parameter, normal RV, mild MR, normal AVR function, aortic valve mean gradient 12 mmHg.  Cardiology is now consulted given elevated troponin.   Per chart review, patient follows Dr. Gala Romney, initially suffered a non-STEMI and acute systolic heart failure and A-fib RVR 04/2021.  Echo with LVEF 20 to 25%.  Cardiac cath 05/17/2021 revealed severe multivessel CAD with no good option for revascularization, normal LVEDP. RHC showed normal filling pressures and normal CO  5.4 L/min, cardiac index 2.88.   He was managed medically.  GDMT was limited due to low BP and kidney disease at the time.    He had recurrent admission for non-STEMI 06/2021, Echocardiogram repeated on 07/24/2021 showed improved LVEF 50%, no RWMA, grade 2 DD, normal RV, mild LAE, trivial MR, mild AI, mild aortic stenosis with mean gradient 11 mmHg.  MRI suggest LVEF 47%, viable myocardium, mild to moderate aortic stenosis with moderate AI.  He was seen by CT surgery and underwent CABG x 4 with LIMA to LAD, SVG to diagonal, SVG to OM, SVG to distal RCA, and aortic valve replacement using a 23  mm Edwards INSPIRIS RESILIA pericardial valve on 07/29/2021.  Postop course was complicated by AKI and persistent A-fib while on amiodarone.  He was placed on Coumadin for 3 months before transition to Eliquis.  Spironolactone, metoprolol, Lasix were stopped due to hypotension and bradycardia.   Repeat echo on 10/18/2021  showed LVEF improved to 55 to 60%, no RWMA, grade 2 DD, normal RV, mild LAE and RAE, mild MR, no aortic stenosis, normal aortic valve prosthesis structure and function with mean gradient 6 mmHg.   He was admitted 06/2022 for acute diastolic heart failure, course was complicated by AKI on CKD while receiving IV diuresis and taking NSAIDs for gout.  Echocardiogram repeated 3/16 with LVEF 55 to 60%, indeterminate diastolic parameters, mildly reduced RV, mild MR, normal structure and function of aortic valve prosthesis with mean gradient 6 mmHg. He was discharged on Lasix with weight 164 pounds, at follow-up appointment with AHF clinic 07/25/2022, Lasix was increased to 40 mg daily, advised to continue losartan, Toprol, Farxiga for GDMT.   He was last seen 09/05/2022 by Prince Rome FNP, felt euvolemic, reports GDMT had been limited due to hyperkalemia and CKD, he was advised to continue Toprol XL 25 mg daily, Lasix 20 mg daily, Farxiga 10 mg daily for GDMT, losartan had been stopped given worsening CKD and spironolactone was felt not appropriate due to hyperkalemia/CKD he was maintained on aspirin 81 mg and atorvastatin 80 mg for CAD.   Past Medical History:  Diagnosis Date   Atrial fibrillation and flutter (HCC)    Cardiac arrhythmia due to congenital heart disease    CKD (chronic kidney disease)    Diabetes mellitus without complication (HCC)    Diabetic peripheral neuropathy associated with type 2 diabetes mellitus (HCC)    Heart failure (HCC)    High blood cholesterol    Thyroid disease     Past Surgical History:  Procedure Laterality Date   AORTIC VALVE REPLACEMENT N/A 07/29/2021   Procedure: AORTIC VALVE REPLACEMENT (AVR) USING EDWARDS INSPIRIS AORTIC VALVE SIZE ;  Surgeon: Alleen Borne, MD;  Location[facility] OR;  Service: Open Heart Surgery;  Laterality: N/A;   CORONARY ARTERY BYPASS GRAFT N/A 07/29/2021   Procedure: CORONARY ARTERY BYPASS GRAFTING (CABG) TIMES FOUR, USING LEFT INTERNAL  MAMMARY ARTERY AND RIGHT GREATER SAPHENOUS VEIN HARVESTED ENDOSCOPICALLYY.;  Surgeon: Alleen Borne, MD;  Location: [facility] OR;  Service: Open Heart Surgery;  Laterality: N/A;   RIGHT/LEFT HEART CATH AND CORONARY ANGIOGRAPHY N/A 05/21/2021   Procedure: RIGHT/LEFT HEART CATH AND CORONARY ANGIOGRAPHY;  Surgeon: Corky Crafts, MD;  Location: [facility] INVASIVE CV LAB;  Service: Cardiovascular;  Laterality: N/A;   TEE WITHOUT CARDIOVERSION N/A 07/29/2021   Procedure: TRANSESOPHAGEAL ECHOCARDIOGRAM (TEE);  Surgeon: Alleen Borne, MD;  Location: [facility] OR;  Service: Open Heart Surgery;  Laterality: N/A;     Home Medications:  Prior to Admission medications   Medication Sig Start Date End Date Taking? Authorizing Provider  apixaban (ELIQUIS) 5 MG TABS tablet TAKE 1 TABLET BY MOUTH TWICE A DAY 08/04/22  Yes Bensimhon, Bevelyn Buckles, MD  aspirin 81 MG EC tablet Take 1 tablet (81 mg total) by mouth daily. Swallow whole. 05/23/21  Yes Arty Baumgartner, NP  atorvastatin (LIPITOR) 80 MG tablet Take 1 tablet (80 mg total) by mouth daily. 05/24/21  Yes Burnadette Pop, MD  dapagliflozin propanediol (FARXIGA) 10 MG TABS tablet Take 1 tablet (10 mg total) by mouth daily before breakfast. 09/05/22  Yes Milford, Anderson Malta, FNP  furosemide (LASIX) 20 MG tablet Take 20 mg by mouth daily.   Yes [provider]  glipiZIDE (GLUCOTROL) 5 MG tablet Take 5 mg by mouth daily. 09/01/22  Yes [provider]  indomethacin (INDOCIN) 25 MG capsule Take 25 mg by mouth daily as needed for mild pain or moderate pain.   Yes [provider]  levothyroxine (SYNTHROID) 112 MCG tablet Take 112 mcg by mouth daily. 02/22/22  Yes [provider]  metoprolol succinate (TOPROL-XL) 25 MG 24 hr tablet Take 1 tablet (25 mg total) by mouth daily. 09/05/22  Yes Milford, Anderson Malta, FNP    Inpatient Medications: Scheduled Meds:  atorvastatin  80 mg Oral Daily   insulin aspart  0-9 Units Subcutaneous Q4H   [START ON 12/30/2022]  levothyroxine  112 mcg Oral Q0600   [START ON 01/01/2023] pantoprazole  40 mg Intravenous Q12H   peg 3350 powder  0.5 kit Oral Once   And   [START ON 12/30/2022] peg 3350 powder  0.5 kit Oral Once   Continuous Infusions:  sodium chloride     pantoprazole 8 mg/hr (12/29/22 1132)   PRN Meds: acetaminophen **OR** acetaminophen, morphine injection  Allergies:   No Known Allergies  Social History:   Social History   Socioeconomic History   Marital status: Married    Spouse name: Not on file   Number of children: 1   Years of education: Not on file   Highest education level: 10th grade  Occupational History   Occupation: retired  Tobacco Use   Smoking status: Former    Current packs/day: 0.00    Average packs/day: 1.5 packs/day for 15.0 years (22.5 ttl pk-yrs)    Types: Cigarettes    Start date: 22    Quit date: 1983    Years since quitting: 41.6   Smokeless tobacco: Never  Vaping Use   Vaping status: Never Used  Substance and Sexual Activity   Alcohol use: No   Drug use: No   Sexual activity: Not on file  Other Topics Concern   Not on file  Social History Narrative   Not on file   Social Determinants of Health   Financial Resource Strain: Low Risk  (05/21/2021)   Overall Financial Resource Strain (CARDIA)    Difficulty of Paying Living Expenses: Not very hard  Food Insecurity: No Food Insecurity (12/28/2022)   Hunger Vital Sign    Worried About Running Out of Food in the Last Year: Never true    Ran Out of Food in the Last Year: Never true  Transportation Needs: No Transportation Needs (12/28/2022)   PRAPARE - Administrator, Civil Service (Medical): No    Lack of Transportation (Non-Medical): No  Physical Activity: Not on file  Stress: Not on file  Social Connections: Not on file  Intimate Partner Violence: Not At Risk (12/28/2022)   Humiliation, Afraid, Rape, and Kick questionnaire    Fear of Current or Ex-Partner: No    Emotionally Abused: No     Physically Abused: No    Sexually Abused: No    Family History:    Family History  Problem Relation Age of Onset   Diabetes Mother    Diabetes Father    Throat cancer Brother    Heart attack Maternal Uncle    Sudden Cardiac Death Neg Hx    Liver disease Neg Hx    Colon cancer Neg Hx      ROS:  Constitutional: Denied fever, chills, malaise, night  sweats Eyes: Denied vision change or loss Ears/Nose/Mouth/Throat: Denied ear ache, sore throat, coughing, sinus pain Cardiovascular: see HPI  Respiratory: denied: see HPI  Gastrointestinal: Denied nausea, vomiting, abdominal pain, diarrhea Genital/Urinary: Denied dysuria, hematuria, urinary frequency/urgency Musculoskeletal: Denied muscle ache, joint pain, weakness Skin: Denied rash, wound Neuro: see HPI  Psych: Denied history of depression/anxiety  Endocrine: history of diabetes     Physical Exam/Data:   Vitals:   12/29/22 0908 12/29/22 0918 12/29/22 1115 12/29/22 1200  BP: 99/74 (!) 107/48 139/65 (!) 122/54  Pulse: 73 73 67 65  Resp:  14  14  Temp:  98.1 F (36.7 C) 98.3 F (36.8 C) 98.1 F (36.7 C)  TempSrc:  Oral Oral Oral  SpO2: 100% 99% 100% 98%  Weight:      Height:        Intake/Output Summary (Last 24 hours) at 12/29/2022 1713 Last data filed at 12/29/2022 1115 Gross per 24 hour  Intake 883.16 ml  Output 400 ml  Net 483.16 ml      12/28/2022   11:13 AM 12/17/2022    2:04 PM 09/23/2022    3:12 PM  Last 3 Weights  Weight (lbs) 170 lb 170 lb 165 lb  Weight (kg) 77.111 kg 77.111 kg 74.844 kg     Body mass index is 27.44 kg/m.   Vitals:  Vitals:   12/29/22 1115 12/29/22 1200  BP: 139/65 (!) 122/54  Pulse: 67 65  Resp:  14  Temp: 98.3 F (36.8 C) 98.1 F (36.7 C)  SpO2: 100% 98%   General Appearance: In no apparent distress, laying in bed, well nourished  HEENT: Normocephalic, left eye bruise  Neck: Supple, trachea midline, no JVDs Cardiovascular: Regular rate and rhythm, normal S1-S2,  grade II  systolic murmur RUSB  Respiratory: Resting breathing unlabored, lungs sounds clear to auscultation bilaterally, no use of accessory muscles. On room air.  No wheezes, rales or rhonchi.   Gastrointestinal: Bowel sounds positive, abdomen soft, non-tender, non-distended. No mass or organomegaly.  Extremities: Able to move all extremities in bed without difficulty,  no edema of BLE Musculoskeletal: Normal muscle bulk and tone Skin: Intact, warm, dry. No rashes or petechiae noted in exposed areas.  Neurologic: Alert, oriented to person, place and time. Fluent speech,  no cognitive deficit,  no gross focal neuro deficit Psychiatric: Normal affect. Mood is appropriate.    EKG:  The EKG was personally reviewed and demonstrates:    EKG from 12/28/2022 reveals sinus rhythm 74 bpm, new onset ST depression of inferior lateral leads comparing to 09/23/2022 EKG  Telemetry:  Telemetry was personally reviewed and demonstrates:    Sinus rhythm 60-70s, intermittent sinus tachycardia noted in the past 24 hours   Relevant CV Studies:  Echocardiogram from 12/29/2022:  1. Left ventricular ejection fraction, by estimation, is 60 to 65%. The  left ventricle has normal function. The left ventricle has no regional  wall motion abnormalities. There is mild concentric left ventricular  hypertrophy. Left ventricular diastolic  parameters were normal.   2. Right ventricular systolic function is normal. The right ventricular  size is normal. Tricuspid regurgitation signal is inadequate for assessing  PA pressure.   3. The mitral valve is degenerative. Mild mitral valve regurgitation.   4. The aortic valve has been repaired/replaced. Aortic valve  regurgitation is not visualized. There is a 23 mm Edwards Inspiris Resilia  pericardial valve present in the aortic position. Aortic valve mean  gradient measures 12.0 mmHg.   5. The  inferior vena cava is normal in size with <50% respiratory  variability, suggesting right  atrial pressure of 8 mmHg.   Comparison(s): Prior images reviewed side by side. LVEF normal range at  60-65%. Pericardial AVR with grossly normal function (dimentionless index  0.61), no aortic regurgitation. mean AV gradient has increased from 6-12  mmHg however since prior study.   Echo from 07/12/2022:  1. Left ventricular ejection fraction, by estimation, is 55 to 60%. The  left ventricle has normal function. The left ventricle has no regional  wall motion abnormalities. Left ventricular diastolic parameters are  indeterminate.   2. Right ventricular systolic function is mildly reduced. The right  ventricular size is normal. Tricuspid regurgitation signal is inadequate  for assessing PA pressure.   3. The mitral valve is normal in structure. Mild mitral valve  regurgitation. No evidence of mitral stenosis.   4. The aortic valve has been repaired/replaced. Aortic valve  regurgitation is not visualized. There is a 23 mm Edwards INSPIRIS RESILIA  pericardial valve valve present in the aortic position. Procedure Date:  07/29/21. Echo findings are consistent with  normal structure and function of the aortic valve prosthesis. Aortic valve  area, by VTI measures 2.02 cm. Aortic valve mean gradient measures 6.0  mmHg. Aortic valve Vmax measures 1.76 m/s.   5. The inferior vena cava is normal in size with greater than 50%  respiratory variability, suggesting right atrial pressure of 3 mmHg.    Right and left heart cath from 05/17/2021:    Prox LAD to Mid LAD lesion is 75% stenosed.   Dist LAD lesion is 80% stenosed.   1st Diag lesion is 80% stenosed.   Ost RCA to Mid RCA lesion is 99% stenosed.   2nd Mrg lesion is 99% stenosed.   Dist Cx lesion is 90% stenosed.   Prox Cx to Mid Cx lesion is 75% stenosed.   LV end diastolic pressure is normal.   There is no aortic valve stenosis.   Severe, multivessel coronary artery disease.  Normal LVEDP.   Given diffuse, distal vessel disease,  there are no good options for revascularization.  Plan for medical therapy to try to improve his LV function.  Probable heart failure consultation as well.   Laboratory Data:  High Sensitivity Troponin:   Recent Labs  Lab 12/28/22 1158 12/28/22 1630 12/29/22 1448  TROPONINIHS 14 83* 1,420*     Chemistry Recent Labs  Lab 12/23/22 1101 12/28/22 1158 12/28/22 1237 12/29/22 0259  NA 141 138 139 136  K 5.3* 4.4 4.5 4.2  CL 102 106  --  111  CO2 26 19*  --  19*  GLUCOSE 214* 213*  --  146*  BUN 60* 103*  --  99*  CREATININE 2.90* 2.45*  --  2.61*  CALCIUM 9.9 8.3*  --  8.3*  GFRNONAA  --  28*  --  26*  ANIONGAP  --  13  --  6    Recent Labs  Lab 12/23/22 1101 12/28/22 1158 12/29/22 0259  PROT 8.0 6.1* 5.8*  ALBUMIN 4.2 3.1* 3.0*  AST 17 15 18   ALT 15 15 13   ALKPHOS 100 59 52  BILITOT 0.4 0.6 0.5   Lipids No results for input(s): "CHOL", "TRIG", "HDL", "LABVLDL", "LDLCALC", "CHOLHDL" in the last 168 hours.  Hematology Recent Labs  Lab 12/28/22 1158 12/28/22 1237 12/29/22 0259 12/29/22 1448  WBC 13.3*  --  15.4* 13.6*  RBC 2.05*  --  2.25* 2.66*  HGB  6.4* 7.1* 7.1* 8.1*  HCT 19.6* 21.0* 20.9* 23.8*  MCV 95.6  --  92.9 89.5  MCH 31.2  --  31.6 30.5  MCHC 32.7  --  34.0 34.0  RDW 13.5  --  14.4 16.6*  PLT 184  --  184 182   Thyroid  Recent Labs  Lab 12/29/22 0259  TSH 5.597*    BNP Recent Labs  Lab 12/28/22 1158  BNP 217.6*    Radiology/Studies:  ECHOCARDIOGRAM COMPLETE  Result Date: 12/29/2022    ECHOCARDIOGRAM REPORT   Patient Name:   Antonio Cervantes Date of Exam: 12/29/2022 Medical Rec #:  [id number]         Height:       66.0 in Accession #:    [id number]        Weight:       170.0 lb Date of Birth:  25-Jan-1956        BSA:          1.866 m Patient Age:    66 years          BP:           110/50 mmHg Patient Gender: M                 HR:           72 bpm. Exam Location:  Inpatient Procedure: 2D Echo, Color Doppler and Cardiac Doppler Indications:     Elevated Troponin  History:        Patient has prior history of Echocardiogram examinations, most                 recent 07/12/2022. CHF, CAD, Prior CABG, Aortic Valve Disease and                 #23 Edwards Inspira Resilia AVR, Arrythmias:Atrial Fibrillation;                 Risk Factors:Diabetes.                 Aortic Valve: 23 mm Edwards Inspiris Resilia pericardial valve                 is present in the aortic position.  Sonographer:    Milbert Coulter Referring Phys: [id number] RIPUDEEP K RAI  Sonographer Comments: Image acquisition challenging due to patient body habitus. IMPRESSIONS  1. Left ventricular ejection fraction, by estimation, is 60 to 65%. The left ventricle has normal function. The left ventricle has no regional wall motion abnormalities. There is mild concentric left ventricular hypertrophy. Left ventricular diastolic parameters were normal.  2. Right ventricular systolic function is normal. The right ventricular size is normal. Tricuspid regurgitation signal is inadequate for assessing PA pressure.  3. The mitral valve is degenerative. Mild mitral valve regurgitation.  4. The aortic valve has been repaired/replaced. Aortic valve regurgitation is not visualized. There is a 23 mm Edwards Inspiris Resilia pericardial valve present in the aortic position. Aortic valve mean gradient measures 12.0 mmHg.  5. The inferior vena cava is normal in size with <50% respiratory variability, suggesting right atrial pressure of 8 mmHg. Comparison(s): Prior images reviewed side by side. LVEF normal range at 60-65%. Pericardial AVR with grossly normal function (dimentionless index 0.61), no aortic regurgitation. mean AV gradient has increased from 6-12 mmHg however since prior study. FINDINGS  Left Ventricle: Left ventricular ejection fraction, by estimation, is 60 to 65%. The left ventricle has normal function. The left ventricle has  no regional wall motion abnormalities. The left ventricular internal cavity size was  normal in size. There is  mild concentric left ventricular hypertrophy. Left ventricular diastolic parameters were normal. Right Ventricle: The right ventricular size is normal. No increase in right ventricular wall thickness. Right ventricular systolic function is normal. Tricuspid regurgitation signal is inadequate for assessing PA pressure. Left Atrium: Left atrial size was normal in size. Right Atrium: Right atrial size was normal in size. Pericardium: There is no evidence of pericardial effusion. Mitral Valve: The mitral valve is degenerative in appearance. Mild mitral valve regurgitation. Tricuspid Valve: The tricuspid valve is grossly normal. Tricuspid valve regurgitation is trivial. Aortic Valve: The aortic valve has been repaired/replaced. Aortic valve regurgitation is not visualized. Aortic valve mean gradient measures 12.0 mmHg. Aortic valve peak gradient measures 25.0 mmHg. Aortic valve area, by VTI measures 1.72 cm. There is a  23 mm Edwards Inspiris Resilia pericardial valve present in the aortic position. Pulmonic Valve: The pulmonic valve was grossly normal. Pulmonic valve regurgitation is trivial. Aorta: The aortic root is normal in size and structure. Venous: The inferior vena cava is normal in size with less than 50% respiratory variability, suggesting right atrial pressure of 8 mmHg. IAS/Shunts: No atrial level shunt detected by color flow Doppler.  LEFT VENTRICLE PLAX 2D LVIDd:         4.80 cm   Diastology LVIDs:         3.00 cm   LV e' medial:    7.72 cm/s LV PW:         1.20 cm   LV E/e' medial:  14.6 LV IVS:        1.20 cm   LV e' lateral:   12.00 cm/s LVOT diam:     1.90 cm   LV E/e' lateral: 9.4 LV SV:         85 LV SV Index:   46 LVOT Area:     2.84 cm  RIGHT VENTRICLE RV Basal diam:  3.40 cm RV S prime:     10.60 cm/s TAPSE (M-mode): 1.6 cm LEFT ATRIUM             Index        RIGHT ATRIUM           Index LA diam:        3.80 cm 2.04 cm/m   RA Area:     15.70 cm LA Vol (A2C):   33.0  ml 17.68 ml/m  RA Volume:   36.60 ml  19.61 ml/m LA Vol (A4C):   37.0 ml 19.82 ml/m LA Biplane Vol: 35.6 ml 19.07 ml/m  AORTIC VALVE AV Area (Vmax):    1.76 cm AV Area (Vmean):   1.78 cm AV Area (VTI):     1.72 cm AV Vmax:           250.00 cm/s AV Vmean:          164.500 cm/s AV VTI:            0.494 m AV Peak Grad:      25.0 mmHg AV Mean Grad:      12.0 mmHg LVOT Vmax:         155.00 cm/s LVOT Vmean:        103.000 cm/s LVOT VTI:          0.300 m LVOT/AV VTI ratio: 0.61 MITRAL VALVE MV Area (PHT): 3.39 cm     SHUNTS MV Decel Time: 224 msec  Systemic VTI:  0.30 m MV E velocity: 113.00 cm/s  Systemic Diam: 1.90 cm MV A velocity: 85.40 cm/s MV E/A ratio:  1.32 Nona Dell MD Electronically signed by Nona Dell MD Signature Date/Time: 12/29/2022/1:31:31 PM    Final    CT Head Wo Contrast  Result Date: 12/28/2022 CLINICAL DATA:  Ataxia, head trauma with pain. EXAM: CT HEAD WITHOUT CONTRAST TECHNIQUE: Contiguous axial images were obtained from the base of the skull through the vertex without intravenous contrast. RADIATION DOSE REDUCTION: This exam was performed according to the departmental dose-optimization program which includes automated exposure control, adjustment of the mA and/or kV according to patient size and/or use of iterative reconstruction technique. COMPARISON:  None Available. 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 There is mild cerebral volume loss with associated ex vacuo dilatation. No mass effect or midline shift. The basilar cisterns are patent. Periventricular white matter hypoattenuation likely represents chronic small vessel ischemic disease. Vascular: There are vascular calcifications in the carotid siphons. Skull: Normal. Negative for fracture or focal lesion. Sinuses/Orbits: There is mild left sphenoid sinus disease. Other: None. IMPRESSION: Small volume area of  hypoattenuation of the right occipital cortex, favored to reflect a chronic infarct/encephalomalacia. If clinically warranted, MRI brain could be performed for further characterization. Electronically Signed   By: Romona Curls M.D.   On: 12/28/2022 14:10   CT CHEST ABDOMEN PELVIS W CONTRAST  Result Date: 12/28/2022 CLINICAL DATA:  Poly trauma, blunt EXAM: CT CHEST, ABDOMEN, AND PELVIS WITH CONTRAST TECHNIQUE: Multidetector CT imaging of the chest, abdomen and pelvis was performed following the standard protocol during bolus administration of intravenous contrast. RADIATION DOSE REDUCTION: This exam was performed according to the departmental dose-optimization program which includes automated exposure control, adjustment of the mA and/or kV according to patient size and/or use of iterative reconstruction technique. CONTRAST:  75mL OMNIPAQUE IOHEXOL 350 MG/ML SOLN COMPARISON:  Recent prior esophagram 12/23/2022; prior CT scan of the abdomen and pelvis 07/07/2016; prior CT scan of the chest 09/16/2017 FINDINGS: CT CHEST FINDINGS Cardiovascular: Conventional 3 vessel arch anatomy. No evidence of aortic aneurysm or dissection. 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 The heart is normal in size. Main pulmonary artery is unremarkable. No pericardial effusion. Mediastinum/Nodes: Unremarkable CT appearance of the thyroid gland. No suspicious mediastinal or hilar adenopathy. No soft tissue mediastinal mass. The thoracic esophagus is unremarkable. Lungs/Pleura: Lungs are clear. No pleural effusion or pneumothorax. Musculoskeletal: No acute fracture or aggressive appearing lytic or blastic osseous lesion. Healed median sternotomy. CT ABDOMEN PELVIS FINDINGS Overall imaging quality of the abdomen and pelvis is moderately limited due to extensive streak artifact from barium throughout the colon which is likely the residual of the recent prior esophagram.  Hepatobiliary: Normal hepatic contour and morphology. Gallbladder is unremarkable. No intra or extrahepatic biliary ductal dilatation. Pancreas: Unremarkable. No pancreatic ductal dilatation or surrounding inflammatory changes. Spleen: Normal in size without focal abnormality. Adrenals/Urinary Tract: The adrenal glands are normal. No hydronephrosis, nephrolithiasis or enhancing renal mass. Ureters and bladder are grossly normal. Stomach/Bowel: High attenuation material present throughout the entire colon from the cecum to the anus. No evidence of bowel obstruction. No focal bowel wall thickening. Vascular/Lymphatic: Atherosclerotic calcifications throughout the abdominal aorta. No evidence of aneurysm. No suspicious lymphadenopathy. Reproductive: The prostate gland is unremarkable. Other: No evidence of ascites. No significant abdominal wall hernia. Musculoskeletal: No acute fracture  or malalignment. Mild multilevel degenerative changes. IMPRESSION: 1. No evidence of acute injury to the chest, abdomen or pelvis. 2. Aortic and coronary artery atherosclerotic vascular calcifications. 3. Surgical changes of prior median sternotomy with aortic valve replacement and multivessel CABG. 4. Evaluation of the intra-abdominal and pelvic structures is somewhat limited due to extensive streak artifact from high density barium throughout the colon. 5. Mild multilevel degenerative changes throughout the spine. Electronically Signed   By: Malachy Moan M.D.   On: 12/28/2022 13:28   DG Chest Portable 1 View  Result Date: 12/28/2022 CLINICAL DATA:  Dyspnea. EXAM: PORTABLE CHEST 1 VIEW COMPARISON:  09/15/2022 FINDINGS: The lungs are clear without focal pneumonia, edema, pneumothorax or pleural effusion. The cardiopericardial silhouette is within normal limits for size. No acute bony abnormality. Telemetry leads overlie the chest. IMPRESSION: No active disease. Electronically Signed   By: Kennith Center M.D.   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 - possible chest pain x1 over the past 4 days, he can't offer clear history due to memory loss  - Hs trop 14 >83 >1420 - EKG concerning for inferolateral ischemia  - Echo with LVEF 60-65%, no RWMA today  - in the setting of acute blood loss anemia and worsening CKD IV, suspect above changes are due to demand ischemia, he certainly has known CAD with CABG in the past, however, Echo is reassuring,  invasive ischemic evaluation at this time would not benefit the clinical picture as he is having active GI bleed which prohibit blood thinner use and with progression of CKD IV which would prohibit contrast use; would allow GI workup to determine source of anemia, transfuse to keep Hgb >8-9, repeat EKG, monitor clinical symptoms - OK to hold ASA 81mg , Lipitor, and metoprolol until he is more stable   Ischemic cardiomyopathy - clinically euvolemic - GDMT: limited due to borderline BP and CKD IV  A fib - in sinus rhythm currently - OK hold eliquis, resume upon GI clearence  - Hold metoprolol, resume when PO intake adequate and BP stable   Anemia GI bleed DM CKD IV - per primary team    Risk Assessment/Risk Scores:   CHA2DS2-VASc Score = 5  This indicates a 7.2% annual risk of stroke. The patient's score is based upon: CHF History: 1 HTN History: 1 Diabetes History: 1 Stroke History: 0 Vascular Disease History: 1 Age Score: 1 Gender Score: 0     For questions or updates, please contact [facility] Please consult [email or web address] for contact info under    Signed, Cyndi Bender, NP  12/29/2022 5:13 PM   Attending note:  Patient seen and examined.  I reviewed his records and discussed the case with Ms. Dion Body NP, I agree with her above findings.  Mr. Suda presents with severe symptomatic anemia with suspected GI blood loss as etiology (hemoglobin 6.4), EGD and colonoscopy  pending.  Cardiology consulted due to abnormal high-sensitivity troponin I levels and ECG as part of his workup.  He has a history of multivessel CAD status post CABG in April of last year including LIMA to LAD, SVG to diagonal, SVG to OM, and SVG to RCA.  He also underwent pericardial AVR in the setting of mild to moderate aortic stenosis with moderate aortic regurgitation.  Postoperative course complicated by atrial fibrillation.  He had initial evidence of ischemic cardiomyopathy although with subsequent normalization of LVEF and  has been on stable medical therapy as an outpatient including aspirin, Eliquis, Toprol-XL, Farxiga, Lipitor, and Lasix.  He also has CKD stage IV at baseline.  On examination today he does not describe any breathlessness or chest pain at rest.  Afebrile, recent systolics running 100-150, heart rate in the 70s in sinus rhythm by telemetry.  Lungs are clear.  Cardiac exam with 2-3/6 systolic murmur.  No peripheral edema.  Pertinent lab work includes potassium 4.2, BUN 99, creatinine 2.61, hemoglobin up to 8.1 following PRBC transfusions, platelets 182, TSH 5.59, high-sensitivity troponin I levels increasing from 14 up to 83 up to 1420.  ECG shows sinus rhythm with inferolateral ST segment changes consistent with ischemia.  Presenting chest x-ray reports no acute process.  Patient has evidence of NSTEMI, suspect type II event in the setting of significant physiologic stress with severe symptomatic anemia and hemoglobin 6.4 at presentation.  He has known multivessel CAD status post CABG and pericardial AVR last year.  Does not report worsening angina at baseline prior to acute presentation, but the presence of interval graft disease is a possible consideration as well.  Echocardiogram done today shows normal LVEF at 60 to 65% without regional wall motion abnormalities and his pericardial AVR is functioning normally (suspect increased gradients likely due to increased flow with anemia  at present).  He is on Lipitor at this time, otherwise Toprol-XL, aspirin, and Eliquis have been held.  Also off [place].  Situation discussed with the patient.  Would recommend proceeding with GI workup, specifically EGD and colonoscopy, overall intermediate risk from a cardiac perspective.  It will be important to clarify whether he has any reversible cause of GI bleeding that could be addressed prior to considering any additional workup of cardiac ischemia.  He would not be a candidate at this time for cardiac catheterization - even if a revascularization option were found, this could not be addressed with dual antiplatelet therapy as this would only increase his bleeding risk in the short term and furthermore defer endoscopic evaluation for 6 months.  General plan will be stabilization and adding back GDMT as tolerated pending findings of GI workup.  Would most likely consider a Lexiscan Myoview once he is stable, potentially as an outpatient to then assess overall ischemic burden and determine whether any further invasive cardiac testing is warranted.  Our service to continue to follow.  Jonelle Sidle, M.D., F.A.C.C.

## 2022-12-29 NOTE — Progress Notes (Signed)
MD Ghimire made aware of critical troponin 1,420.

## 2022-12-29 NOTE — H&P (View-Only) (Signed)
Attending physician's note   I have taken a history, reviewed the chart, and examined the patient. I performed a substantive portion of this encounter, including complete performance of at least one of the key components, in conjunction with the APP. I agree with the APP's note, impression, and recommendations with my edits.   67 year old male with medical history as outlined below, to include history of CAD s/p 4V CABG 07/2021, AoVR/2023, A-fib (on Eliquis),CHFpEF, HTN, diabetes, CKD 4, gout, admitted with symptomatic anemia with DOE, lightheadedness, fatigue, near syncope.  Was recently evaluated in our GI clinic for initial consultation on 12/17/2022 for evaluation of globus sensation and anemia, and was being scheduled for outpatient EGD/colonoscopy.  No previous EGD/colonoscopy.  Admission evaluation notable for the following:  - H/H 6.4/19.6.  Transfused 2 units --> 7.1/21.  Transfusing another 1 unit now - BUN/creatinine 103/2.45 (baseline creatinine ~2.1-2.2) - INR 1.9 - CT C/A/P without acute intra-abdominal pathology - CT head: Chronic infarct/encephalomalacia without acute changes - CXR without acute cardiopulmonary disease  Recent outpatient evaluation on 8/27 notable for the following: - H/H 10/31 with MCV/RDW 94/14 - Ferritin 90, iron 61, TIBC 306, sat 20% - Esophagram: Mild esophageal dysmotility, likely presbyesophagus.  13 mm barium tablet passed promptly  Baseline H/H from 04/2022 was 12/34  1) Symptomatic anemia 2) Acute on chronic anemia - Additional 1 unit RBCs now - Posttransfusion CBC check and serial CBCs with additional blood products as needed per protocol - Clears today with bowel prep this evening - N.p.o. at midnight - Plan for EGD/colonoscopy tomorrow for diagnostic and therapeutic intent - Check B12, folate - Recent outpatient iron panel was essentially normal  3) Dyspnea on exertion 4) Elevated troponin 5) History of CAD s/p 4V CABG 07/2021 6)  A-fib on chronic anticoagulation 7) CHFpEF - Troponin 14--> 83.  Suspect demand ischemia 2/2 anemia.  Discussed with Dr. Jerral Ralph.  Will continue trending - Management per primary Hospitalist service - Holding aspirin and Eliquis   Dr. Adela Lank will assume his ongoing inpatient GI care starting tomorrow morning, to include endoscopic evaluation as above.   The indications, risks, and benefits of EGD and colonoscopy were explained to the patient in detail. Risks include but are not limited to bleeding, perforation, adverse reaction to medications, and cardiopulmonary compromise. Sequelae include but are not limited to the possibility of surgery, hospitalization, and mortality. The patient verbalized understanding and wished to proceed. All questions answered.     [address], [place], [place] [phone] office           Consultation  Referring Provider:  [facility]  Primary Care Physician:  Ellis Parents, FNP Primary Gastroenterologist:  Dr. Chales Abrahams       Reason for Consultation: Symptomatic anemia       LOS: 0 days          HPI:   Antonio Cervantes is a 67 y.o. male with past medical history significant for CABG x 4, A-fib on Eliquis, HFpEF, DM type II, presents for evaluation of symptomatic anemia.  Patient recently seen in office 12/17/2022 by Quentin Mulling, PA-C for globus sensation over the last 6 months.  Barium swallow showed mild esophageal dysmotility (presbyesophagus).  Tablet passed without difficulty.  Patient was placed on low-dose Protonix.  Patient presented to ED with exertional dyspnea and dizziness.  He reports a fall 2 days ago in which his granddaughter accidentally tripped him.  He reports striking his head on the pavement and since this  fall he has been having lightheadedness and weakness.  He has also had some dyspnea on exertion.  His daughter-in-law carted to his cardiologist who recommended he take increase his Lasix for 3 days.  Upon presentation to ED he  was found to have a hemoglobin of 6.4 and melena on rectal exam.  Down from 10.0 12/23/2022.  Patient states he had not noticed melena until he presented to the ED.  He denies nausea/vomiting.  Denies abdominal pain.  Denies ibuprofen use.  Denies alcohol use.  Remote tobacco use (quit 40 years ago).  Denies previous colonoscopy/endoscopy.  Denies family history of colon cancer.  Pertinent workup - Hgb 6.4 (10.0 5 days prior) -MCV 92.9 - Leukocytosis with WBC 15.4 - BUN 99, creatinine 2.61, GFR 26 - TSH 5.5 - CT head shows chronic infarct/encephalomalacia - CT chest abdomen pelvis with contrast shows no acute injury.  Artifact from barium throughout the colon.  Patient states his last dose of Eliquis was 9/1 AM  ED course: Hypotensive with BP 97/70.  Pulse 77.  2 units transfusion, IV PPI, IV Rocephin x 1  Past Medical History:  Diagnosis Date   Atrial fibrillation and flutter (HCC)    Cardiac arrhythmia due to congenital heart disease    CKD (chronic kidney disease)    Diabetes mellitus without complication (HCC)    Diabetic peripheral neuropathy associated with type 2 diabetes mellitus (HCC)    Heart failure (HCC)    High blood cholesterol    Thyroid disease     Surgical History:  He  has a past surgical history that includes RIGHT/LEFT HEART CATH AND CORONARY ANGIOGRAPHY (N/A, 05/21/2021); Coronary artery bypass graft (N/A, 07/29/2021); Aortic valve replacement (N/A, 07/29/2021); and TEE without cardioversion (N/A, 07/29/2021). Family History:  His family history includes Diabetes in his father and mother; Heart attack in his maternal uncle; Throat cancer in his brother. Social History:   reports that he quit smoking about 41 years ago. His smoking use included cigarettes. He started smoking about 56 years ago. He has a 22.5 pack-year smoking history. He has never used smokeless tobacco. He reports that he does not drink alcohol and does not use drugs.  Prior to Admission medications    Medication Sig Start Date End Date Taking? Authorizing Provider  apixaban (ELIQUIS) 5 MG TABS tablet TAKE 1 TABLET BY MOUTH TWICE A DAY 08/04/22  Yes Bensimhon, Bevelyn Buckles, MD  aspirin 81 MG EC tablet Take 1 tablet (81 mg total) by mouth daily. Swallow whole. 05/23/21  Yes Arty Baumgartner, NP  atorvastatin (LIPITOR) 80 MG tablet Take 1 tablet (80 mg total) by mouth daily. 05/24/21  Yes Burnadette Pop, MD  dapagliflozin propanediol (FARXIGA) 10 MG TABS tablet Take 1 tablet (10 mg total) by mouth daily before breakfast. 09/05/22  Yes Milford, Anderson Malta, FNP  furosemide (LASIX) 20 MG tablet Take 20 mg by mouth daily.   Yes [provider]  glipiZIDE (GLUCOTROL) 5 MG tablet Take 5 mg by mouth daily. 09/01/22  Yes [provider]  indomethacin (INDOCIN) 25 MG capsule Take 25 mg by mouth daily as needed for mild pain or moderate pain.   Yes [provider]  levothyroxine (SYNTHROID) 112 MCG tablet Take 112 mcg by mouth daily. 02/22/22  Yes [provider]  metoprolol succinate (TOPROL-XL) 25 MG 24 hr tablet Take 1 tablet (25 mg total) by mouth daily. 09/05/22  Yes Milford, Anderson Malta, FNP    Current Facility-Administered Medications  Medication Dose Route  Frequency Provider Last Rate Last Admin   acetaminophen (TYLENOL) tablet 650 mg  650 mg Oral Q6H PRN Rai, Ripudeep K, MD       Or   acetaminophen (TYLENOL) suppository 650 mg  650 mg Rectal Q6H PRN Rai, Ripudeep K, MD       atorvastatin (LIPITOR) tablet 80 mg  80 mg Oral Daily Maretta Bees, MD   80 mg at 12/29/22 0917   insulin aspart (novoLOG) injection 0-9 Units  0-9 Units Subcutaneous Q4H Rai, Ripudeep K, MD   1 Units at 12/29/22 0843   [START ON 12/30/2022] levothyroxine (SYNTHROID) tablet 112 mcg  112 mcg Oral Q0600 Maretta Bees, MD       morphine (PF) 2 MG/ML injection 1 mg  1 mg Intravenous Q4H PRN Rai, Ripudeep K, MD       [START ON 01/01/2023] pantoprazole (PROTONIX) injection 40 mg  40 mg Intravenous  Q12H Rai, Ripudeep K, MD       pantoprozole (PROTONIX) 80 mg /NS 100 mL infusion  8 mg/hr Intravenous Continuous Rai, Ripudeep K, MD 10 mL/hr at 12/29/22 0014 8 mg/hr at 12/29/22 0014    Allergies as of 12/28/2022   (No Known Allergies)    Review of Systems  Constitutional:  Positive for malaise/fatigue. Negative for chills, fever and weight loss.  HENT:  Negative for hearing loss and tinnitus.   Eyes:  Negative for blurred vision and double vision.  Respiratory:  Positive for shortness of breath. Negative for wheezing.   Cardiovascular:  Negative for chest pain and palpitations.  Gastrointestinal:  Positive for melena. Negative for abdominal pain, blood in stool, constipation, diarrhea, heartburn, nausea and vomiting.  Genitourinary:  Negative for dysuria and urgency.  Musculoskeletal:  Positive for falls.  Skin:  Negative for itching and rash.  Neurological:  Positive for weakness. Negative for seizures and loss of consciousness.  Psychiatric/Behavioral:  Negative for depression and suicidal ideas.        Physical Exam:  Vital signs in last 24 hours: Temp:  [97.8 F (36.6 C)-98.3 F (36.8 C)] 98.1 F (36.7 C) (09/02 0918) Pulse Rate:  [70-92] 73 (09/02 0918) Resp:  [11-24] 14 (09/02 0918) BP: (94-153)/(48-99) 107/48 (09/02 0918) SpO2:  [97 %-100 %] 99 % (09/02 0918) Weight:  [77.1 kg] 77.1 kg (09/01 1113) Last BM Date : 12/27/22 Last BM recorded by nurses in past 5 days No data recorded  Physical Exam Constitutional:      Appearance: He is ill-appearing.  HENT:     Head: Normocephalic and atraumatic.     Nose: Nose normal. No congestion.  Eyes:     Extraocular Movements: Extraocular movements intact.     Comments: Bruising around left eye  Cardiovascular:     Rate and Rhythm: Normal rate. Rhythm irregular.  Pulmonary:     Effort: Pulmonary effort is normal. No respiratory distress.  Abdominal:     General: Bowel sounds are normal. There is no distension.      Palpations: Abdomen is soft. There is no mass.     Tenderness: There is no abdominal tenderness. There is no guarding or rebound.     Hernia: No hernia is present.  Musculoskeletal:        General: No swelling. Normal range of motion.     Cervical back: Normal range of motion and neck supple.     Comments: Multiple excoriations  Skin:    General: Skin is warm and dry.     Coloration: Skin  is pale.  Neurological:     General: No focal deficit present.     Mental Status: He is alert and oriented to person, place, and time.  Psychiatric:        Mood and Affect: Mood normal.        Behavior: Behavior normal.        Thought Content: Thought content normal.        Judgment: Judgment normal.      LAB RESULTS: Recent Labs    12/28/22 1158 12/28/22 1237 12/29/22 0259  WBC 13.3*  --  15.4*  HGB 6.4* 7.1* 7.1*  HCT 19.6* 21.0* 20.9*  PLT 184  --  184   BMET Recent Labs    12/28/22 1158 12/28/22 1237 12/29/22 0259  NA 138 139 136  K 4.4 4.5 4.2  CL 106  --  111  CO2 19*  --  19*  GLUCOSE 213*  --  146*  BUN 103*  --  99*  CREATININE 2.45*  --  2.61*  CALCIUM 8.3*  --  8.3*   LFT Recent Labs    12/29/22 0259  PROT 5.8*  ALBUMIN 3.0*  AST 18  ALT 13  ALKPHOS 52  BILITOT 0.5   PT/INR Recent Labs    12/28/22 1158  LABPROT 21.8*  INR 1.9*    STUDIES: CT Head Wo Contrast  Result Date: 12/28/2022 CLINICAL DATA:  Ataxia, head trauma with pain. EXAM: CT HEAD WITHOUT CONTRAST TECHNIQUE: Contiguous axial images were obtained from the base of the skull through the vertex without intravenous contrast. RADIATION DOSE REDUCTION: This exam was performed according to the departmental dose-optimization program which includes automated exposure control, adjustment of the mA and/or kV according to patient size and/or use of iterative reconstruction technique. COMPARISON:  None Available. FINDINGS: Brain: There is a small volume area of hypoattenuation of the right occipital  cortex, favored to reflect a chronic infarcts/encephalomalacia. There is mild cerebral volume loss with associated ex vacuo dilatation. No mass effect or midline shift. The basilar cisterns are patent. Periventricular white matter hypoattenuation likely represents chronic small vessel ischemic disease. Vascular: There are vascular calcifications in the carotid siphons. Skull: Normal. Negative for fracture or focal lesion. Sinuses/Orbits: There is mild left sphenoid sinus disease. Other: None. 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 If clinically warranted, MRI brain could be performed for further characterization. Electronically Signed   By: Romona Curls M.D.   On: 12/28/2022 14:10   CT CHEST ABDOMEN PELVIS W CONTRAST  Result Date: 12/28/2022 CLINICAL DATA:  Poly trauma, blunt EXAM: CT CHEST, ABDOMEN, AND PELVIS WITH CONTRAST TECHNIQUE: Multidetector CT imaging of the chest, abdomen and pelvis was performed following the standard protocol during bolus administration of intravenous contrast. RADIATION DOSE REDUCTION: This exam was performed according to the departmental dose-optimization program which includes automated exposure control, adjustment of the mA and/or kV according to patient size and/or use of iterative reconstruction technique. CONTRAST:  75mL OMNIPAQUE IOHEXOL 350 MG/ML SOLN COMPARISON:  Recent prior esophagram 12/23/2022; prior CT scan of the abdomen and pelvis 07/07/2016; prior CT scan of the chest 09/16/2017 FINDINGS: CT CHEST FINDINGS Cardiovascular: Conventional 3 vessel arch anatomy. No evidence of aortic aneurysm or dissection. 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 The heart is normal in size. Main pulmonary artery is unremarkable. No pericardial effusion. Mediastinum/Nodes: Unremarkable CT appearance of the thyroid gland. No  suspicious mediastinal or hilar adenopathy. No soft tissue mediastinal mass. The thoracic esophagus is unremarkable. Lungs/Pleura: Lungs are clear. No pleural effusion or pneumothorax. Musculoskeletal: No acute fracture or aggressive appearing lytic or blastic osseous lesion. Healed median sternotomy. CT ABDOMEN PELVIS FINDINGS Overall imaging quality of the abdomen and pelvis is moderately limited due to extensive streak artifact from barium throughout the colon which is likely the residual of the recent prior esophagram. Hepatobiliary: Normal hepatic contour and morphology. Gallbladder is unremarkable. No intra or extrahepatic biliary ductal dilatation. Pancreas: Unremarkable. No pancreatic ductal dilatation or surrounding inflammatory changes. Spleen: Normal in size without focal abnormality. Adrenals/Urinary Tract: The adrenal glands are normal. No hydronephrosis, nephrolithiasis or enhancing renal mass. Ureters and bladder are grossly normal. Stomach/Bowel: High attenuation material present throughout the entire colon from the cecum to the anus. No evidence of bowel obstruction. No focal bowel wall thickening. Vascular/Lymphatic: Atherosclerotic calcifications throughout the abdominal aorta. No evidence of aneurysm. No suspicious lymphadenopathy. Reproductive: The prostate gland is unremarkable. Other: No evidence of ascites. No significant abdominal wall hernia. Musculoskeletal: No acute fracture or malalignment. Mild multilevel degenerative changes. IMPRESSION: 1. No evidence of acute injury to the chest, abdomen or pelvis. 2. Aortic and coronary artery atherosclerotic vascular calcifications. 3. Surgical changes of prior median sternotomy with aortic valve replacement and multivessel CABG. 4. Evaluation of the intra-abdominal and pelvic structures is somewhat limited due to extensive streak artifact from high density barium throughout the colon. 5. Mild multilevel degenerative changes throughout the spine.  Electronically Signed   By: Malachy Moan M.D.   On: 12/28/2022 13:28   DG Chest Portable 1 View  Result Date: 12/28/2022 CLINICAL DATA:  Dyspnea. EXAM: PORTABLE CHEST 1 VIEW COMPARISON:  09/15/2022 FINDINGS: The lungs are clear without focal pneumonia, edema, pneumothorax or pleural effusion. The cardiopericardial silhouette is within normal limits for size. No acute bony abnormality. Telemetry leads overlie the chest. IMPRESSION: No active disease. Electronically Signed   By: Kennith Center M.D.   On: 12/28/2022 12:28      Impression    67 year old male history of CABG, HFrEF, A-fib on Eliquis, presents for symptomatic anemia with Hgb 6.4 (down from 10 within 5 days), melena, recent fall, no previous colonoscopy/EGD.  Last dose Eliquis 9/1 AM  Symptomatic anemia Melena - Hgb 6.4 (10.0 5 days prior) -MCV 92.9 - Leukocytosis with WBC 15.4 - BUN 99, creatinine 2.61, GFR 26 - TSH 5.5 - CT head shows chronic infarct/encephalomalacia - CT chest abdomen pelvis with contrast shows no acute injury.  Artifact from barium throughout the colon. Patient denies overt bleeding but melanotic stool noted by EDP rectal exam. Takes Eliquis and ASA daily.  Significant elevation in BUN from baseline in the setting of GI presumed bleed.  PAF Last dose Eliquis 9/1 AM  CAD s/p CABG 07/2021  CKDStageIV   Plan   Plan for EGD/Colonoscopy tomorrow. I thoroughly discussed the procedures to include nature, alternatives, benefits, and risks including but not limited to bleeding, perforation, infection, anesthesia/cardiac and pulmonary complications. Patient provides understanding and gave verbal consent to proceed. Continue Protonix 40 mg IV BID. Moviprep, clear liquid diet, NPO at midnight. Continue daily CBC with transfusion as needed to maintain Hgb >7.   Continue to hold eliquis  Thank you for your kind consultation, we will continue to follow.   Bayley Leanna Sato  12/29/2022, 9:31 AM

## 2022-12-29 NOTE — Consult Note (Addendum)
Attending physician's note   I have taken a history, reviewed the chart, and examined the patient. I performed a substantive portion of this encounter, including complete performance of at least one of the key components, in conjunction with the APP. I agree with the APP's note, impression, and recommendations with my edits.   67 year old male with medical history as outlined below, to include history of CAD s/p 4V CABG 07/2021, AoVR/2023, A-fib (on Eliquis),CHFpEF, HTN, diabetes, CKD 4, gout, admitted with symptomatic anemia with DOE, lightheadedness, fatigue, near syncope.  Was recently evaluated in our GI clinic for initial consultation on 12/17/2022 for evaluation of globus sensation and anemia, and was being scheduled for outpatient EGD/colonoscopy.  No previous EGD/colonoscopy.  Admission evaluation notable for the following:  - H/H 6.4/19.6.  Transfused 2 units --> 7.1/21.  Transfusing another 1 unit now - BUN/creatinine 103/2.45 (baseline creatinine ~2.1-2.2) - INR 1.9 - CT C/A/P without acute intra-abdominal pathology - CT head: Chronic infarct/encephalomalacia without acute changes - CXR without acute cardiopulmonary disease  Recent outpatient evaluation on 8/27 notable for the following: - H/H 10/31 with MCV/RDW 94/14 - Ferritin 90, iron 61, TIBC 306, sat 20% - Esophagram: Mild esophageal dysmotility, likely presbyesophagus.  13 mm barium tablet passed promptly  Baseline H/H from 04/2022 was 12/34  1) Symptomatic anemia 2) Acute on chronic anemia - Additional 1 unit RBCs now - Posttransfusion CBC check and serial CBCs with additional blood products as needed per protocol - Clears today with bowel prep this evening - N.p.o. at midnight - Plan for EGD/colonoscopy tomorrow for diagnostic and therapeutic intent - Check B12, folate - Recent outpatient iron panel was essentially normal  3) Dyspnea on exertion 4) Elevated troponin 5) History of CAD s/p 4V CABG 07/2021 6)  A-fib on chronic anticoagulation 7) CHFpEF - Troponin 14--> 83.  Suspect demand ischemia 2/2 anemia.  Discussed with Dr. Jerral Ralph.  Will continue trending - Management per primary Hospitalist service - Holding aspirin and Eliquis   Dr. Adela Lank will assume his ongoing inpatient GI care starting tomorrow morning, to include endoscopic evaluation as above.   The indications, risks, and benefits of EGD and colonoscopy were explained to the patient in detail. Risks include but are not limited to bleeding, perforation, adverse reaction to medications, and cardiopulmonary compromise. Sequelae include but are not limited to the possibility of surgery, hospitalization, and mortality. The patient verbalized understanding and wished to proceed. All questions answered.     [address], [place], [place] [phone] office           Consultation  Referring Provider:  [facility]  Primary Care Physician:  Ellis Parents, FNP Primary Gastroenterologist:  Dr. Chales Abrahams       Reason for Consultation: Symptomatic anemia       LOS: 0 days          HPI:   Antonio Cervantes is a 67 y.o. male with past medical history significant for CABG x 4, A-fib on Eliquis, HFpEF, DM type II, presents for evaluation of symptomatic anemia.  Patient recently seen in office 12/17/2022 by Quentin Mulling, PA-C for globus sensation over the last 6 months.  Barium swallow showed mild esophageal dysmotility (presbyesophagus).  Tablet passed without difficulty.  Patient was placed on low-dose Protonix.  Patient presented to ED with exertional dyspnea and dizziness.  He reports a fall 2 days ago in which his granddaughter accidentally tripped him.  He reports striking his head on the pavement and since this  fall he has been having lightheadedness and weakness.  He has also had some dyspnea on exertion.  His daughter-in-law carted to his cardiologist who recommended he take increase his Lasix for 3 days.  Upon presentation to ED he  was found to have a hemoglobin of 6.4 and melena on rectal exam.  Down from 10.0 12/23/2022.  Patient states he had not noticed melena until he presented to the ED.  He denies nausea/vomiting.  Denies abdominal pain.  Denies ibuprofen use.  Denies alcohol use.  Remote tobacco use (quit 40 years ago).  Denies previous colonoscopy/endoscopy.  Denies family history of colon cancer.  Pertinent workup - Hgb 6.4 (10.0 5 days prior) -MCV 92.9 - Leukocytosis with WBC 15.4 - BUN 99, creatinine 2.61, GFR 26 - TSH 5.5 - CT head shows chronic infarct/encephalomalacia - CT chest abdomen pelvis with contrast shows no acute injury.  Artifact from barium throughout the colon.  Patient states his last dose of Eliquis was 9/1 AM  ED course: Hypotensive with BP 97/70.  Pulse 77.  2 units transfusion, IV PPI, IV Rocephin x 1  Past Medical History:  Diagnosis Date   Atrial fibrillation and flutter (HCC)    Cardiac arrhythmia due to congenital heart disease    CKD (chronic kidney disease)    Diabetes mellitus without complication (HCC)    Diabetic peripheral neuropathy associated with type 2 diabetes mellitus (HCC)    Heart failure (HCC)    High blood cholesterol    Thyroid disease     Surgical History:  He  has a past surgical history that includes RIGHT/LEFT HEART CATH AND CORONARY ANGIOGRAPHY (N/A, 05/21/2021); Coronary artery bypass graft (N/A, 07/29/2021); Aortic valve replacement (N/A, 07/29/2021); and TEE without cardioversion (N/A, 07/29/2021). Family History:  His family history includes Diabetes in his father and mother; Heart attack in his maternal uncle; Throat cancer in his brother. Social History:   reports that he quit smoking about 41 years ago. His smoking use included cigarettes. He started smoking about 56 years ago. He has a 22.5 pack-year smoking history. He has never used smokeless tobacco. He reports that he does not drink alcohol and does not use drugs.  Prior to Admission medications    Medication Sig Start Date End Date Taking? Authorizing Provider  apixaban (ELIQUIS) 5 MG TABS tablet TAKE 1 TABLET BY MOUTH TWICE A DAY 08/04/22  Yes Bensimhon, Bevelyn Buckles, MD  aspirin 81 MG EC tablet Take 1 tablet (81 mg total) by mouth daily. Swallow whole. 05/23/21  Yes Arty Baumgartner, NP  atorvastatin (LIPITOR) 80 MG tablet Take 1 tablet (80 mg total) by mouth daily. 05/24/21  Yes Burnadette Pop, MD  dapagliflozin propanediol (FARXIGA) 10 MG TABS tablet Take 1 tablet (10 mg total) by mouth daily before breakfast. 09/05/22  Yes Milford, Anderson Malta, FNP  furosemide (LASIX) 20 MG tablet Take 20 mg by mouth daily.   Yes [provider]  glipiZIDE (GLUCOTROL) 5 MG tablet Take 5 mg by mouth daily. 09/01/22  Yes [provider]  indomethacin (INDOCIN) 25 MG capsule Take 25 mg by mouth daily as needed for mild pain or moderate pain.   Yes [provider]  levothyroxine (SYNTHROID) 112 MCG tablet Take 112 mcg by mouth daily. 02/22/22  Yes [provider]  metoprolol succinate (TOPROL-XL) 25 MG 24 hr tablet Take 1 tablet (25 mg total) by mouth daily. 09/05/22  Yes Milford, Anderson Malta, FNP    Current Facility-Administered Medications  Medication Dose Route  Frequency Provider Last Rate Last Admin   acetaminophen (TYLENOL) tablet 650 mg  650 mg Oral Q6H PRN Rai, Ripudeep K, MD       Or   acetaminophen (TYLENOL) suppository 650 mg  650 mg Rectal Q6H PRN Rai, Ripudeep K, MD       atorvastatin (LIPITOR) tablet 80 mg  80 mg Oral Daily Maretta Bees, MD   80 mg at 12/29/22 0917   insulin aspart (novoLOG) injection 0-9 Units  0-9 Units Subcutaneous Q4H Rai, Ripudeep K, MD   1 Units at 12/29/22 0843   [START ON 12/30/2022] levothyroxine (SYNTHROID) tablet 112 mcg  112 mcg Oral Q0600 Maretta Bees, MD       morphine (PF) 2 MG/ML injection 1 mg  1 mg Intravenous Q4H PRN Rai, Ripudeep K, MD       [START ON 01/01/2023] pantoprazole (PROTONIX) injection 40 mg  40 mg Intravenous  Q12H Rai, Ripudeep K, MD       pantoprozole (PROTONIX) 80 mg /NS 100 mL infusion  8 mg/hr Intravenous Continuous Rai, Ripudeep K, MD 10 mL/hr at 12/29/22 0014 8 mg/hr at 12/29/22 0014    Allergies as of 12/28/2022   (No Known Allergies)    Review of Systems  Constitutional:  Positive for malaise/fatigue. Negative for chills, fever and weight loss.  HENT:  Negative for hearing loss and tinnitus.   Eyes:  Negative for blurred vision and double vision.  Respiratory:  Positive for shortness of breath. Negative for wheezing.   Cardiovascular:  Negative for chest pain and palpitations.  Gastrointestinal:  Positive for melena. Negative for abdominal pain, blood in stool, constipation, diarrhea, heartburn, nausea and vomiting.  Genitourinary:  Negative for dysuria and urgency.  Musculoskeletal:  Positive for falls.  Skin:  Negative for itching and rash.  Neurological:  Positive for weakness. Negative for seizures and loss of consciousness.  Psychiatric/Behavioral:  Negative for depression and suicidal ideas.        Physical Exam:  Vital signs in last 24 hours: Temp:  [97.8 F (36.6 C)-98.3 F (36.8 C)] 98.1 F (36.7 C) (09/02 0918) Pulse Rate:  [70-92] 73 (09/02 0918) Resp:  [11-24] 14 (09/02 0918) BP: (94-153)/(48-99) 107/48 (09/02 0918) SpO2:  [97 %-100 %] 99 % (09/02 0918) Weight:  [77.1 kg] 77.1 kg (09/01 1113) Last BM Date : 12/27/22 Last BM recorded by nurses in past 5 days No data recorded  Physical Exam Constitutional:      Appearance: He is ill-appearing.  HENT:     Head: Normocephalic and atraumatic.     Nose: Nose normal. No congestion.  Eyes:     Extraocular Movements: Extraocular movements intact.     Comments: Bruising around left eye  Cardiovascular:     Rate and Rhythm: Normal rate. Rhythm irregular.  Pulmonary:     Effort: Pulmonary effort is normal. No respiratory distress.  Abdominal:     General: Bowel sounds are normal. There is no distension.      Palpations: Abdomen is soft. There is no mass.     Tenderness: There is no abdominal tenderness. There is no guarding or rebound.     Hernia: No hernia is present.  Musculoskeletal:        General: No swelling. Normal range of motion.     Cervical back: Normal range of motion and neck supple.     Comments: Multiple excoriations  Skin:    General: Skin is warm and dry.     Coloration: Skin  is pale.  Neurological:     General: No focal deficit present.     Mental Status: He is alert and oriented to person, place, and time.  Psychiatric:        Mood and Affect: Mood normal.        Behavior: Behavior normal.        Thought Content: Thought content normal.        Judgment: Judgment normal.      LAB RESULTS: Recent Labs    12/28/22 1158 12/28/22 1237 12/29/22 0259  WBC 13.3*  --  15.4*  HGB 6.4* 7.1* 7.1*  HCT 19.6* 21.0* 20.9*  PLT 184  --  184   BMET Recent Labs    12/28/22 1158 12/28/22 1237 12/29/22 0259  NA 138 139 136  K 4.4 4.5 4.2  CL 106  --  111  CO2 19*  --  19*  GLUCOSE 213*  --  146*  BUN 103*  --  99*  CREATININE 2.45*  --  2.61*  CALCIUM 8.3*  --  8.3*   LFT Recent Labs    12/29/22 0259  PROT 5.8*  ALBUMIN 3.0*  AST 18  ALT 13  ALKPHOS 52  BILITOT 0.5   PT/INR Recent Labs    12/28/22 1158  LABPROT 21.8*  INR 1.9*    STUDIES: CT Head Wo Contrast  Result Date: 12/28/2022 CLINICAL DATA:  Ataxia, head trauma with pain. EXAM: CT HEAD WITHOUT CONTRAST TECHNIQUE: Contiguous axial images were obtained from the base of the skull through the vertex without intravenous contrast. RADIATION DOSE REDUCTION: This exam was performed according to the departmental dose-optimization program which includes automated exposure control, adjustment of the mA and/or kV according to patient size and/or use of iterative reconstruction technique. COMPARISON:  None Available. FINDINGS: Brain: There is a small volume area of hypoattenuation of the right occipital  cortex, favored to reflect a chronic infarcts/encephalomalacia. There is mild cerebral volume loss with associated ex vacuo dilatation. No mass effect or midline shift. The basilar cisterns are patent. Periventricular white matter hypoattenuation likely represents chronic small vessel ischemic disease. Vascular: There are vascular calcifications in the carotid siphons. Skull: Normal. Negative for fracture or focal lesion. Sinuses/Orbits: There is mild left sphenoid sinus disease. Other: None. 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 If clinically warranted, MRI brain could be performed for further characterization. Electronically Signed   By: Romona Curls M.D.   On: 12/28/2022 14:10   CT CHEST ABDOMEN PELVIS W CONTRAST  Result Date: 12/28/2022 CLINICAL DATA:  Poly trauma, blunt EXAM: CT CHEST, ABDOMEN, AND PELVIS WITH CONTRAST TECHNIQUE: Multidetector CT imaging of the chest, abdomen and pelvis was performed following the standard protocol during bolus administration of intravenous contrast. RADIATION DOSE REDUCTION: This exam was performed according to the departmental dose-optimization program which includes automated exposure control, adjustment of the mA and/or kV according to patient size and/or use of iterative reconstruction technique. CONTRAST:  75mL OMNIPAQUE IOHEXOL 350 MG/ML SOLN COMPARISON:  Recent prior esophagram 12/23/2022; prior CT scan of the abdomen and pelvis 07/07/2016; prior CT scan of the chest 09/16/2017 FINDINGS: CT CHEST FINDINGS Cardiovascular: Conventional 3 vessel arch anatomy. No evidence of aortic aneurysm or dissection. 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 The heart is normal in size. Main pulmonary artery is unremarkable. No pericardial effusion. Mediastinum/Nodes: Unremarkable CT appearance of the thyroid gland. No  suspicious mediastinal or hilar adenopathy. No soft tissue mediastinal mass. The thoracic esophagus is unremarkable. Lungs/Pleura: Lungs are clear. No pleural effusion or pneumothorax. Musculoskeletal: No acute fracture or aggressive appearing lytic or blastic osseous lesion. Healed median sternotomy. CT ABDOMEN PELVIS FINDINGS Overall imaging quality of the abdomen and pelvis is moderately limited due to extensive streak artifact from barium throughout the colon which is likely the residual of the recent prior esophagram. Hepatobiliary: Normal hepatic contour and morphology. Gallbladder is unremarkable. No intra or extrahepatic biliary ductal dilatation. Pancreas: Unremarkable. No pancreatic ductal dilatation or surrounding inflammatory changes. Spleen: Normal in size without focal abnormality. Adrenals/Urinary Tract: The adrenal glands are normal. No hydronephrosis, nephrolithiasis or enhancing renal mass. Ureters and bladder are grossly normal. Stomach/Bowel: High attenuation material present throughout the entire colon from the cecum to the anus. No evidence of bowel obstruction. No focal bowel wall thickening. Vascular/Lymphatic: Atherosclerotic calcifications throughout the abdominal aorta. No evidence of aneurysm. No suspicious lymphadenopathy. Reproductive: The prostate gland is unremarkable. Other: No evidence of ascites. No significant abdominal wall hernia. Musculoskeletal: No acute fracture or malalignment. Mild multilevel degenerative changes. IMPRESSION: 1. No evidence of acute injury to the chest, abdomen or pelvis. 2. Aortic and coronary artery atherosclerotic vascular calcifications. 3. Surgical changes of prior median sternotomy with aortic valve replacement and multivessel CABG. 4. Evaluation of the intra-abdominal and pelvic structures is somewhat limited due to extensive streak artifact from high density barium throughout the colon. 5. Mild multilevel degenerative changes throughout the spine.  Electronically Signed   By: Malachy Moan M.D.   On: 12/28/2022 13:28   DG Chest Portable 1 View  Result Date: 12/28/2022 CLINICAL DATA:  Dyspnea. EXAM: PORTABLE CHEST 1 VIEW COMPARISON:  09/15/2022 FINDINGS: The lungs are clear without focal pneumonia, edema, pneumothorax or pleural effusion. The cardiopericardial silhouette is within normal limits for size. No acute bony abnormality. Telemetry leads overlie the chest. IMPRESSION: No active disease. Electronically Signed   By: Kennith Center M.D.   On: 12/28/2022 12:28      Impression    67 year old male history of CABG, HFrEF, A-fib on Eliquis, presents for symptomatic anemia with Hgb 6.4 (down from 10 within 5 days), melena, recent fall, no previous colonoscopy/EGD.  Last dose Eliquis 9/1 AM  Symptomatic anemia Melena - Hgb 6.4 (10.0 5 days prior) -MCV 92.9 - Leukocytosis with WBC 15.4 - BUN 99, creatinine 2.61, GFR 26 - TSH 5.5 - CT head shows chronic infarct/encephalomalacia - CT chest abdomen pelvis with contrast shows no acute injury.  Artifact from barium throughout the colon. Patient denies overt bleeding but melanotic stool noted by EDP rectal exam. Takes Eliquis and ASA daily.  Significant elevation in BUN from baseline in the setting of GI presumed bleed.  PAF Last dose Eliquis 9/1 AM  CAD s/p CABG 07/2021  CKDStageIV   Plan   Plan for EGD/Colonoscopy tomorrow. I thoroughly discussed the procedures to include nature, alternatives, benefits, and risks including but not limited to bleeding, perforation, infection, anesthesia/cardiac and pulmonary complications. Patient provides understanding and gave verbal consent to proceed. Continue Protonix 40 mg IV BID. Moviprep, clear liquid diet, NPO at midnight. Continue daily CBC with transfusion as needed to maintain Hgb >7.   Continue to hold eliquis  Thank you for your kind consultation, we will continue to follow.   Antonio Cervantes  12/29/2022, 9:31 AM

## 2022-12-29 NOTE — Progress Notes (Signed)
PROGRESS NOTE        PATIENT DETAILS Name: Antonio Cervantes Age: 67 y.o. Sex: male Date of Birth: 1955-07-29 Admit Date: 12/28/2022 Admitting Physician Ripudeep Jenna Luo, MD IRJ:JOACZYSA, Kathrene Bongo, FNP  Brief Summary: Patient is a 67 y.o.  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  See below for further details.  Significant events: 9/1>> admit to [facility]-symptomatic anemia-melanotic stools on digital rectal exam by EDP  Significant studies: 9/1>> CXR: No active disease 9/1>> CT head: No acute abnormalities 9/1>> CT chest/abdomen/pelvis: No evidence of acute injury in the chest/abdomen/pelvis.  Significant microbiology data: None  Procedures: None  Consults: GI  Subjective: Lying comfortably in bed-denies any chest pain or shortness of breath.  He is not aware of any dark-colored stools overnight.   Objective: Vitals: Blood pressure (!) 110/50, pulse 70, temperature 98.1 F (36.7 C), temperature source Oral, resp. rate 16, height 5\' 6"  (1.676 m), weight 77.1 kg, SpO2 99%.   Exam: Gen Exam:Alert awake-not in any distress HEENT:atraumatic, normocephalic Chest: B/L clear to auscultation anteriorly CVS:S1S2 regular Abdomen:soft non tender, non distended Extremities:no edema Neurology: Non focal Skin: no rash  Pertinent Labs/Radiology:    Latest Ref Rng & Units 12/29/2022    2:59 AM 12/28/2022   12:37 PM 12/28/2022   11:58 AM  CBC  WBC 4.0 - 10.5 K/uL 15.4   13.3   Hemoglobin 13.0 - 17.0 g/dL 7.1  7.1  6.4   Hematocrit 39.0 - 52.0 % 20.9  21.0  19.6   Platelets 150 - 400 K/uL 184   184     Lab Results  Component Value Date   NA 136 12/29/2022   K  4.2 12/29/2022   CL 111 12/29/2022   CO2 19 (L) 12/29/2022      Assessment/Plan: Upper GI bleeding with acute blood loss anemia In the setting of Eliquis/aspirin/NSAID use No history of overt bleeding-but patient poor historian-however did have melanotic appearing stools on digital rectal exam by ED physician Hemoglobin still 7.1 this morning after 2 units of PRBC transfusion yesterday-given his cardiac issues-will transfuse 1 additional unit. Continue PPI Follow CBC Await GI input-at a minimum require EGD-probably will require colonoscopy (never had colonoscopy in the past)   Near syncope/generalized weakness/mechanical fall Secondary to symptomatic anemia/blood loss/probable orthostatic hypotension See above regarding plans to treat underlying GI bleed CT imaging negative for any acute injuries. 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  DM-2 (A1c 8.0 on 8/9) CBG stable with SSI Oral hypoglycemic agents on hold  Recent Labs    12/28/22 2332 12/29/22 0327 12/29/22 0837  GLUCAP 173* 128* 142*    CKD stage IV Creatinine close to baseline Significant elevation in BUN-but this is likely due to upper GI bleed CKD stage IIIb ruled out  Hypothyroidism Synthroid TSH minimally elevated-stable for close outpatient follow-up  with PCP and repeat thyroid function test in 6-8 weeks.  BMI: Estimated body mass index is 27.44 kg/m as calculated from the following:   Height as of this encounter: 5\' 6"  (1.676 m).   Weight as of this encounter: 77.1 kg.   Code status:   Code Status: Full Code   DVT Prophylaxis: SCDs Start: 12/28/22 1625   Family Communication: None at  bedside   Disposition Plan: Status is: Observation The patient will require care spanning > 2 midnights and should be moved to inpatient because: Severity of illness   Planned Discharge Destination:Home health   Diet: Diet Order             Diet NPO time specified Except for: Ice Chips, Sips with Meds  Diet effective midnight                     Antimicrobial agents: Anti-infectives (From admission, onward)    Start     Dose/Rate Route Frequency Ordered Stop   12/28/22 1345  cefTRIAXone (ROCEPHIN) 1 g in sodium chloride 0.9 % 100 mL IVPB        1 g 200 mL/hr over 30 Minutes Intravenous  Once 12/28/22 1344 12/28/22 1623        MEDICATIONS: Scheduled Meds:  sodium chloride   Intravenous Once   acetaminophen  650 mg Oral Once   diphenhydrAMINE  25 mg Oral Once   insulin aspart  0-9 Units Subcutaneous Q4H   [START ON 01/01/2023] pantoprazole  40 mg Intravenous Q12H   Continuous Infusions:  pantoprazole 8 mg/hr (12/29/22 0014)   PRN Meds:.acetaminophen **OR** acetaminophen, morphine injection   I have personally reviewed following labs and imaging studies  LABORATORY DATA: CBC: Recent Labs  Lab 12/23/22 1101 12/28/22 1158 12/28/22 1237 12/29/22 0259  WBC 14.3* 13.3*  --  15.4*  NEUTROABS 10.2* 10.6*  --   --   HGB 10.0* 6.4* 7.1* 7.1*  HCT 30.8* 19.6* 21.0* 20.9*  MCV 94.4 95.6  --  92.9  PLT 298.0 184  --  184    Basic Metabolic Panel: Recent Labs  Lab 12/23/22 1101 12/28/22 1158 12/28/22 1237 12/29/22 0259  NA 141 138 139 136  K 5.3* 4.4 4.5 4.2  CL 102 106  --  111  CO2 26 19*  --  19*  GLUCOSE 214* 213*  --  146*  BUN 60* 103*  --  99*  CREATININE 2.90* 2.45*  --  2.61*  CALCIUM 9.9 8.3*  --  8.3*    GFR: Estimated Creatinine Clearance: 26.8 mL/min (A) (by C-G formula based on SCr of 2.61 mg/dL (H)).  Liver Function Tests: Recent Labs  Lab 12/23/22 1101 12/28/22 1158 12/29/22 0259  AST 17 15 18   ALT 15 15 13   ALKPHOS 100 59  52  BILITOT 0.4 0.6 0.5  PROT 8.0 6.1* 5.8*  ALBUMIN 4.2 3.1* 3.0*   No results for input(s): "LIPASE", "AMYLASE" in the last 168 hours. No results for input(s): "AMMONIA" in the last 168 hours.  Coagulation Profile: Recent Labs  Lab 12/28/22 1158  INR 1.9*    Cardiac Enzymes: No results for input(s): "CKTOTAL", "CKMB", "CKMBINDEX", "TROPONINI" in the last 168 hours.  BNP (last 3 results) No results for input(s): "PROBNP" in the last 8760 hours.  Lipid Profile: No results for input(s): "CHOL", "HDL", "LDLCALC", "TRIG", "CHOLHDL", "LDLDIRECT" in the last 72 hours.  Thyroid Function Tests: Recent Labs    12/29/22 0259  TSH 5.597*  Anemia Panel: No results for input(s): "VITAMINB12", "FOLATE", "FERRITIN", "TIBC", "IRON", "RETICCTPCT" in the last 72 hours.  Urine analysis:    Component Value Date/Time   COLORURINE STRAW (A) 07/28/2021 2100   APPEARANCEUR CLEAR 07/28/2021 2100   LABSPEC 1.010 07/28/2021 2100   PHURINE 5.0 07/28/2021 2100   GLUCOSEU >=500 (A) 07/28/2021 2100   HGBUR NEGATIVE 07/28/2021 2100   BILIRUBINUR NEGATIVE 07/28/2021 2100   KETONESUR NEGATIVE 07/28/2021 2100   PROTEINUR NEGATIVE 07/28/2021 2100   NITRITE NEGATIVE 07/28/2021 2100   LEUKOCYTESUR NEGATIVE 07/28/2021 2100    Sepsis Labs: Lactic Acid, Venous No results found for: "LATICACIDVEN"  MICROBIOLOGY: No results found for this or any previous visit (from the past 240 hour(s)).  RADIOLOGY STUDIES/RESULTS: CT Head Wo Contrast  Result Date: 12/28/2022 CLINICAL DATA:  Ataxia, head trauma with pain. EXAM: CT HEAD WITHOUT CONTRAST TECHNIQUE: Contiguous axial images were obtained from the base of the skull through the vertex without intravenous contrast. RADIATION DOSE REDUCTION: This exam was performed according to the departmental dose-optimization program which includes automated exposure control, adjustment of the mA and/or kV according to patient size and/or use of iterative  reconstruction technique. COMPARISON:  None Available. 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 There is mild cerebral volume loss with associated ex vacuo dilatation. No mass effect or midline shift. The basilar cisterns are patent. Periventricular white matter hypoattenuation likely represents chronic small vessel ischemic disease. Vascular: There are vascular calcifications in the carotid siphons. Skull: Normal. Negative for fracture or focal lesion. Sinuses/Orbits: There is mild left sphenoid sinus disease. Other: None. 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 If clinically warranted, MRI brain could be performed for further characterization. Electronically Signed   By: Romona Curls M.D.   On: 12/28/2022 14:10   CT CHEST ABDOMEN PELVIS W CONTRAST  Result Date: 12/28/2022 CLINICAL DATA:  Poly trauma, blunt EXAM: CT CHEST, ABDOMEN, AND PELVIS WITH CONTRAST TECHNIQUE: Multidetector CT imaging of the chest, abdomen and pelvis was performed following the standard protocol during bolus administration of intravenous contrast. RADIATION DOSE REDUCTION: This exam was performed according to the departmental dose-optimization program which includes automated exposure control, adjustment of the mA and/or kV according to patient size and/or use of iterative reconstruction technique. CONTRAST:  75mL OMNIPAQUE IOHEXOL 350 MG/ML SOLN COMPARISON:  Recent prior esophagram 12/23/2022; prior CT scan of the abdomen and pelvis 07/07/2016; prior CT scan of the chest 09/16/2017 FINDINGS: CT CHEST FINDINGS Cardiovascular: Conventional 3 vessel arch anatomy. No evidence of aortic aneurysm or dissection. 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 The heart is normal in  size. Main pulmonary artery is unremarkable. No pericardial effusion. Mediastinum/Nodes: Unremarkable CT appearance of the thyroid gland. No suspicious mediastinal or hilar adenopathy. No soft tissue mediastinal mass. The thoracic esophagus is unremarkable. Lungs/Pleura: Lungs are clear. No pleural effusion or pneumothorax. Musculoskeletal: No acute fracture or aggressive appearing lytic or blastic osseous lesion. Healed median sternotomy. CT ABDOMEN PELVIS FINDINGS Overall imaging quality of the abdomen and pelvis is moderately limited due to extensive streak artifact from barium throughout the colon which is likely the residual of the recent prior esophagram. Hepatobiliary: Normal hepatic contour and morphology. Gallbladder is unremarkable. No intra or extrahepatic biliary ductal dilatation. Pancreas: Unremarkable. No pancreatic ductal dilatation or surrounding inflammatory changes. Spleen: Normal in size without  focal abnormality. Adrenals/Urinary Tract: The adrenal glands are normal. No hydronephrosis, nephrolithiasis or enhancing renal mass. Ureters and bladder are grossly normal. Stomach/Bowel: High attenuation material present throughout the entire colon from the cecum to the anus. No evidence of bowel obstruction. No focal bowel wall thickening. Vascular/Lymphatic: Atherosclerotic calcifications throughout the abdominal aorta. No evidence of aneurysm. No suspicious lymphadenopathy. Reproductive: The prostate gland is unremarkable. Other: No evidence of ascites. No significant abdominal wall hernia. Musculoskeletal: No acute fracture or malalignment. Mild multilevel degenerative changes. IMPRESSION: 1. No evidence of acute injury to the chest, abdomen or pelvis. 2. Aortic and coronary artery atherosclerotic vascular calcifications. 3. Surgical changes of prior median sternotomy with aortic valve replacement and multivessel CABG. 4. Evaluation of the intra-abdominal and pelvic structures is somewhat limited  due to extensive streak artifact from high density barium throughout the colon. 5. Mild multilevel degenerative changes throughout the spine. Electronically Signed   By: Malachy Moan M.D.   On: 12/28/2022 13:28   DG Chest Portable 1 View  Result Date: 12/28/2022 CLINICAL DATA:  Dyspnea. EXAM: PORTABLE CHEST 1 VIEW COMPARISON:  09/15/2022 FINDINGS: The lungs are clear without focal pneumonia, edema, pneumothorax or pleural effusion. The cardiopericardial silhouette is within normal limits for size. No acute bony abnormality. Telemetry leads overlie the chest. IMPRESSION: No active disease. Electronically Signed   By: Kennith Center M.D.   On: 12/28/2022 12:28     LOS: 0 days   Jeoffrey Massed, MD  [facility]    To contact the attending provider between 7A-7P or the covering provider during after hours 7P-7A, please log into the web site [email or web address] and access using universal [place] password for that web site. If you do not have the password, please call the hospital operator.  12/29/2022, 8:41 AM

## 2022-12-30 ENCOUNTER — Inpatient Hospital Stay (HOSPITAL_COMMUNITY): Payer: Medicare Other | Admitting: Anesthesiology

## 2022-12-30 ENCOUNTER — Encounter (HOSPITAL_COMMUNITY)
Admission: EM | Disposition: A | Discharge status: Home / Self Care | Payer: Self-pay | Source: Home / Self Care | Attending: Internal Medicine

## 2022-12-30 ENCOUNTER — Encounter (HOSPITAL_COMMUNITY): Payer: Self-pay | Admitting: Internal Medicine

## 2022-12-30 DIAGNOSIS — K921 Melena: Secondary | ICD-10-CM | POA: Diagnosis not present

## 2022-12-30 DIAGNOSIS — K6289 Other specified diseases of anus and rectum: Secondary | ICD-10-CM | POA: Diagnosis not present

## 2022-12-30 DIAGNOSIS — I130 Hypertensive heart and chronic kidney disease with heart failure and stage 1 through stage 4 chronic kidney disease, or unspecified chronic kidney disease: Secondary | ICD-10-CM

## 2022-12-30 DIAGNOSIS — E1169 Type 2 diabetes mellitus with other specified complication: Secondary | ICD-10-CM | POA: Diagnosis not present

## 2022-12-30 DIAGNOSIS — K2970 Gastritis, unspecified, without bleeding: Secondary | ICD-10-CM | POA: Diagnosis not present

## 2022-12-30 DIAGNOSIS — K259 Gastric ulcer, unspecified as acute or chronic, without hemorrhage or perforation: Secondary | ICD-10-CM | POA: Diagnosis not present

## 2022-12-30 DIAGNOSIS — I5021 Acute systolic (congestive) heart failure: Secondary | ICD-10-CM | POA: Diagnosis not present

## 2022-12-30 DIAGNOSIS — K250 Acute gastric ulcer with hemorrhage: Secondary | ICD-10-CM

## 2022-12-30 DIAGNOSIS — K922 Gastrointestinal hemorrhage, unspecified: Secondary | ICD-10-CM | POA: Diagnosis not present

## 2022-12-30 DIAGNOSIS — E785 Hyperlipidemia, unspecified: Secondary | ICD-10-CM | POA: Diagnosis not present

## 2022-12-30 DIAGNOSIS — I4891 Unspecified atrial fibrillation: Secondary | ICD-10-CM

## 2022-12-30 DIAGNOSIS — I255 Ischemic cardiomyopathy: Secondary | ICD-10-CM

## 2022-12-30 DIAGNOSIS — D62 Acute posthemorrhagic anemia: Secondary | ICD-10-CM | POA: Diagnosis not present

## 2022-12-30 DIAGNOSIS — I214 Non-ST elevation (NSTEMI) myocardial infarction: Secondary | ICD-10-CM | POA: Diagnosis not present

## 2022-12-30 HISTORY — PX: COLONOSCOPY: SHX5424

## 2022-12-30 HISTORY — PX: ESOPHAGOGASTRODUODENOSCOPY: SHX5428

## 2022-12-30 HISTORY — PX: BIOPSY: SHX5522

## 2022-12-30 HISTORY — PX: HEMOSTASIS CLIP PLACEMENT: SHX6857

## 2022-12-30 LAB — CBC
HCT: 24.6 % — ABNORMAL LOW (ref 39.0–52.0)
HCT: 23.7 % — ABNORMAL LOW (ref 39.0–52.0)
Hemoglobin: 8.4 g/dL — ABNORMAL LOW (ref 13.0–17.0)
Hemoglobin: 8.1 g/dL — ABNORMAL LOW (ref 13.0–17.0)
MCH: 31.2 pg (ref 26.0–34.0)
MCH: 31.4 pg (ref 26.0–34.0)
MCHC: 34.1 g/dL (ref 30.0–36.0)
MCHC: 34.2 g/dL (ref 30.0–36.0)
MCV: 91.4 fL (ref 80.0–100.0)
MCV: 91.9 fL (ref 80.0–100.0)
Platelets: 215 10*3/uL (ref 150–400)
Platelets: 207 10*3/uL (ref 150–400)
RBC: 2.69 MIL/uL — ABNORMAL LOW (ref 4.22–5.81)
RBC: 2.58 MIL/uL — ABNORMAL LOW (ref 4.22–5.81)
RDW: 17.4 % — ABNORMAL HIGH (ref 11.5–15.5)
RDW: 17 % — ABNORMAL HIGH (ref 11.5–15.5)
WBC: 14.2 10*3/uL — ABNORMAL HIGH (ref 4.0–10.5)
WBC: 11.3 10*3/uL — ABNORMAL HIGH (ref 4.0–10.5)
nRBC: 0 % (ref 0.0–0.2)
nRBC: 0 % (ref 0.0–0.2)

## 2022-12-30 LAB — BASIC METABOLIC PANEL
Anion gap: 12 (ref 5–15)
BUN: 68 mg/dL — ABNORMAL HIGH (ref 8–23)
CO2: 18 mmol/L — ABNORMAL LOW (ref 22–32)
Calcium: 9.2 mg/dL (ref 8.9–10.3)
Chloride: 110 mmol/L (ref 98–111)
Creatinine, Ser: 2.45 mg/dL — ABNORMAL HIGH (ref 0.61–1.24)
GFR, Estimated: 28 mL/min — ABNORMAL LOW (ref >60)
Glucose, Bld: 110 mg/dL — ABNORMAL HIGH (ref 70–99)
Potassium: 4.5 mmol/L (ref 3.5–5.1)
Sodium: 140 mmol/L (ref 135–145)

## 2022-12-30 LAB — GLUCOSE, CAPILLARY
Glucose-Capillary: 99 mg/dL (ref 70–99)
Glucose-Capillary: 111 mg/dL — ABNORMAL HIGH (ref 70–99)
Glucose-Capillary: 93 mg/dL (ref 70–99)
Glucose-Capillary: 197 mg/dL — ABNORMAL HIGH (ref 70–99)
Glucose-Capillary: 228 mg/dL — ABNORMAL HIGH (ref 70–99)
Glucose-Capillary: 150 mg/dL — ABNORMAL HIGH (ref 70–99)

## 2022-12-30 SURGERY — EGD (ESOPHAGOGASTRODUODENOSCOPY)
Anesthesia: Monitor Anesthesia Care

## 2022-12-30 MED ORDER — LACTATED RINGERS IV SOLN
INTRAVENOUS | Status: DC | PRN
Start: 2022-12-30 — End: 2022-12-30

## 2022-12-30 MED ORDER — PROPOFOL 500 MG/50ML IV EMUL
INTRAVENOUS | Status: DC | PRN
Start: 2022-12-30 — End: 2022-12-30
  Administered 2022-12-30: 100 ug/kg/min via INTRAVENOUS

## 2022-12-30 MED ORDER — EPHEDRINE SULFATE (PRESSORS) 50 MG/ML IJ SOLN
INTRAMUSCULAR | Status: DC | PRN
Start: 2022-12-30 — End: 2022-12-30
  Administered 2022-12-30 (×2): 5 mg via INTRAVENOUS

## 2022-12-30 MED ORDER — LACTATED RINGERS IV SOLN
INTRAVENOUS | Status: DC
  Administered 2022-12-30: 1000 mL via INTRAVENOUS

## 2022-12-30 NOTE — Transfer of Care (Signed)
2Immediate Anesthesia Transfer of Care Note  Patient: Antonio Cervantes  Procedure(s) Performed: ESOPHAGOGASTRODUODENOSCOPY (EGD) COLONOSCOPY HEMOSTASIS CLIP PLACEMENT BIOPSY  Patient Location: Endoscopy Unit  Anesthesia Type:MAC  Level of Consciousness: awake, alert , and oriented  Airway & Oxygen Therapy: Patient Spontanous Breathing and Patient connected to nasal cannula oxygen  Post-op Assessment: Report given to RN and Post -op Vital signs reviewed and stable  Post vital signs: Reviewed and stable  Last Vitals:  Vitals Value Taken Time  BP 105/88 12/30/22 1102  Temp    Pulse 75 12/30/22 1103  Resp 21 12/30/22 1103  SpO2 100 % 12/30/22 1103  Vitals shown include unfiled device data.  Last Pain:  Vitals:   12/30/22 0950  TempSrc: Temporal  PainSc: 0-No pain         Complications: No notable events documented.

## 2022-12-30 NOTE — Evaluation (Signed)
Occupational Therapy Evaluation and Discharge Summary Patient Details Name: Antonio Cervantes MRN: [id number] DOB: [birth year] Today's Date: 12/30/2022   History of Present Illness 67 y.o. male admitted 9/1 with anemia, GIB. PMhx: CABG x 4, A-fib on Eliquis, HFpEF, T2DM, neuropathy, AVR   Clinical Impression   Pt admitted with the above diagnosis and overall is at or very close to baseline with all adls not requiring any hands on assist for bathing, grooming, dressing, feeding or toileting. Pt navigated hallway without assist or LOB. No further OT needs at this time.       If plan is discharge home, recommend the following: Other (comment) (back home with normal routine)    Functional Status Assessment  Patient has not had a recent decline in their functional status  Equipment Recommendations  None recommended by OT    Recommendations for Other Services       Precautions / Restrictions Precautions Precautions: Fall Restrictions Weight Bearing Restrictions: No      Mobility Bed Mobility Overal bed mobility: Modified Independent                  Transfers Overall transfer level: Modified independent Equipment used: None               General transfer comment: no assist needed      Balance Overall balance assessment: No apparent balance deficits (not formally assessed)                                         ADL either performed or assessed with clinical judgement   ADL Overall ADL's : At baseline                                       General ADL Comments: Pt is very independent and continues to be. Pt is tired from procedures but able to toilet, dress, groom and sink and walk halls without assist.     Vision Baseline Vision/History: 0 No visual deficits Ability to See in Adequate Light: 0 Adequate Patient Visual Report: No change from baseline Vision Assessment?: No apparent visual deficits Additional Comments:  glasses for reading only     Perception Perception: Within Functional Limits       Praxis Praxis: WFL       Pertinent Vitals/Pain Pain Assessment Pain Assessment: No/denies pain     Extremity/Trunk Assessment Upper Extremity Assessment Upper Extremity Assessment: Overall WFL for tasks assessed   Lower Extremity Assessment Lower Extremity Assessment: Defer to PT evaluation   Cervical / Trunk Assessment Cervical / Trunk Assessment: Normal   Communication Communication Communication: No apparent difficulties   Cognition Arousal: Alert Behavior During Therapy: WFL for tasks assessed/performed Overall Cognitive Status: Within Functional Limits for tasks assessed                                       General Comments  Pt states he is at or close to baseline    Exercises     Shoulder Instructions      Home Living Family/patient expects to be discharged to:: Private residence Living Arrangements: Children;Other relatives Available Help at Discharge: Family;Available 24 hours/day Type of Home: House Home Access: Stairs to enter Entrance  Stairs-Number of Steps: 2   Home Layout: Two level;Able to live on main level with bedroom/bathroom     Bathroom Shower/Tub: Producer, television/film/video: Standard     Home Equipment: Agricultural consultant (2 wheels)          Prior Functioning/Environment Prior Level of Function : Independent/Modified Independent;Driving             Mobility Comments: doesn't use AD ADLs Comments: independent        OT Problem List:        OT Treatment/Interventions:      OT Goals(Current goals can be found in the care plan section) Acute Rehab OT Goals Patient Stated Goal: to go home OT Goal Formulation: All assessment and education complete, DC therapy  OT Frequency:      Co-evaluation              AM-PAC OT "6 Clicks" Daily Activity     Outcome Measure Help from another person eating meals?:  None Help from another person taking care of personal grooming?: None Help from another person toileting, which includes using toliet, bedpan, or urinal?: None Help from another person bathing (including washing, rinsing, drying)?: None Help from another person to put on and taking off regular upper body clothing?: None Help from another person to put on and taking off regular lower body clothing?: None 6 Click Score: 24   End of Session Nurse Communication: Mobility status  Activity Tolerance: Patient tolerated treatment well Patient left: in chair;with call bell/phone within reach;with chair alarm set  OT Visit Diagnosis: Unsteadiness on feet (R26.81)                Time: 2952-8413 OT Time Calculation (min): 18 min Charges:  OT General Charges $OT Visit: 1 Visit OT Evaluation $OT Eval Low Complexity: 1 Low  Hope Budds 12/30/2022, 2:32 PM

## 2022-12-30 NOTE — Anesthesia Preprocedure Evaluation (Addendum)
Anesthesia Evaluation  Patient identified by MRN, date of birth, ID band Patient awake    Reviewed: Allergy & Precautions, NPO status , Patient's Chart, lab work & pertinent test results  Airway Mallampati: II  TM Distance: >3 FB Neck ROM: Full    Dental no notable dental hx.    Pulmonary former smoker   Pulmonary exam normal        Cardiovascular hypertension, Pt. on medications and Pt. on home beta blockers + Past MI and +CHF  + dysrhythmias Atrial Fibrillation  Rhythm:Irregular Rate:Normal     Neuro/Psych negative neurological ROS  negative psych ROS   GI/Hepatic Neg liver ROS,,,Melena    Endo/Other  diabetes, Type 2, Oral Hypoglycemic Agents    Renal/GU CRFRenal disease  negative genitourinary   Musculoskeletal negative musculoskeletal ROS (+)    Abdominal Normal abdominal exam  (+)   Peds  Hematology  (+) Blood dyscrasia, anemia Lab Results      Component                Value               Date                      WBC                      13.6 (H)            12/29/2022                HGB                      8.1 (L)             12/29/2022                HCT                      23.8 (L)            12/29/2022                MCV                      89.5                12/29/2022                PLT                      182                 12/29/2022              Anesthesia Other Findings   Reproductive/Obstetrics                             Anesthesia Physical Anesthesia Plan  ASA: 3  Anesthesia Plan: MAC   Post-op Pain Management:    Induction: Intravenous  PONV Risk Score and Plan: 1 and Propofol infusion and Treatment may vary due to age or medical condition  Airway Management Planned: Simple Face Mask and Nasal Cannula  Additional Equipment: None  Intra-op Plan:   Post-operative Plan:   Informed Consent: I have reviewed the patients History and Physical, chart,  labs and discussed the procedure including the risks, benefits and alternatives  for the proposed anesthesia with the patient or authorized representative who has indicated his/her understanding and acceptance.     Dental advisory given  Plan Discussed with: CRNA  Anesthesia Plan Comments:        Anesthesia Quick Evaluation

## 2022-12-30 NOTE — Anesthesia Procedure Notes (Signed)
Procedure Name: MAC Date/Time: 12/30/2022 10:15 AM  Performed by: Gwenyth Allegra, CRNAPre-anesthesia Checklist: Emergency Drugs available, Patient being monitored and Timeout performed Patient Re-evaluated:Patient Re-evaluated prior to induction Oxygen Delivery Method: Nasal cannula Preoxygenation: Pre-oxygenation with 100% oxygen Induction Type: IV induction

## 2022-12-30 NOTE — Anesthesia Postprocedure Evaluation (Signed)
Anesthesia Post Note  Patient: Antonio Cervantes  Procedure(s) Performed: ESOPHAGOGASTRODUODENOSCOPY (EGD) COLONOSCOPY HEMOSTASIS CLIP PLACEMENT BIOPSY     Patient location during evaluation: PACU Anesthesia Type: MAC Level of consciousness: awake and alert Pain management: pain level controlled Vital Signs Assessment: post-procedure vital signs reviewed and stable Respiratory status: spontaneous breathing, nonlabored ventilation, respiratory function stable and patient connected to nasal cannula oxygen Cardiovascular status: stable and blood pressure returned to baseline Postop Assessment: no apparent nausea or vomiting Anesthetic complications: no   No notable events documented.  Last Vitals:  Vitals:   12/30/22 1200 12/30/22 1300  BP: (!) 125/52 (!) 116/57  Pulse: 63 72  Resp: 11 15  Temp:    SpO2: 98% 99%    Last Pain:  Vitals:   12/30/22 1134  TempSrc:   PainSc: 0-No pain                 Earl Lites P Wilford [place]ryfield

## 2022-12-30 NOTE — Interval H&P Note (Signed)
History and Physical Interval Note: Patient stable overnight. Evaluated by cardiology who did not recommend any further ischemic evaluation for elevated troponin, recommended endoscopic evaluation to address the anemia / bleeding which likely triggered the demand ischemia. He had passed some dark stool recently, with BUN elevation, suspect most likely he has had upper tract bleeding in the seeing of anticoagulation. Last dose of Eliquis at least 48 hours ago. On IV protonix. Hgb stable in the 8s.   He has never had a colonoscopy before - we will do EGD and colonoscopy to clear his bowel and evaluate his bleeding. If he has polyps on this exam, which I suspect is very likely, may not remove them today in light of his recent bleeding and would need outpatient follow up to address that at another time. I have discussed risks / benefits of the anesthesia and endoscopy with him and he understands, wants to proceed. He denies any cardiopulmonary symptoms at this time and feels okay / stable at this time. Exam unchanged. Further recommendations pending the results    12/30/2022 9:55 AM  Antonio Cervantes  has presented today for surgery, with the diagnosis of symptomatic anemia, melena.  The various methods of treatment have been discussed with the patient and family. After consideration of risks, benefits and other options for treatment, the patient has consented to  Procedure(s): ESOPHAGOGASTRODUODENOSCOPY (EGD) (N/A) COLONOSCOPY (N/A) as a surgical intervention.  The patient's history has been reviewed, patient examined, no change in status, stable for surgery.  I have reviewed the patient's chart and labs.  Questions were answered to the patient's satisfaction.     Viviann Spare P Kerin Cecchi

## 2022-12-30 NOTE — Progress Notes (Signed)
Chaplain responded to a consult request for Advance Directive education. Chaplain introduced spiritual care and offered support in the setting of inpatient hospitalization. Antonio Cervantes reports he is feeling "pretty good" today. He is unmarried and has an adult child.  Chaplain provided the Advance Directive packet as well as education on Advance Directives-documents an individual completes to communicate their health care directions in advance of a time when they may need them. Chaplain informed pt the documents which may be completed here in the hospital are the Living Will and Health Care Power of [place].   Chaplain informed that the Health Care Power of [place] is a legal document in which an individual names another person, their Health Care Agent, to make health care decisions when the individual is not able to make them for themselves. The Health Care Agent's function can be temporary or permanent depending on the pt's ability to make and communicate those decisions independently. Chaplain informed pt in the absence of a Health Care Power of [place], the state of [place] directs health care providers to look to the following individuals in the order listed: legal guardian; an attorney?in?fact under a general power of attorney (POA) if that POA includes the right to make health care decisions; a husband or wife; a majority of parents and adult children; a majority of adult brothers and sisters; or an individual who has an established relationship with you, who is acting in good faith and who can convey your wishes.  If none of these person are available or willing to make medical decisions on a patient's behalf, the law allows the patient's doctor to make decisions for them as long as another doctor agrees with those decisions.  Chaplain also informed the patient that the Health Care agent has no decision-making authority over any affairs other than those related to his or her medical care.    The chaplain further educated the pt that a Living Will is a legal document that allows an individual to state his or her desire not to receive life-prolonging measures in the event that they have a condition that is incurable and will result in their death in a short period of time; they are unconscious, and doctors are confident that they will not regain consciousness; and/or they have advanced dementia or other substantial and irreversible loss of mental function. The chaplain informed pt that life-prolonging measures are medical treatments that would only serve to postpone death, including breathing machines, kidney dialysis, antibiotics, artificial nutrition and hydration (tube feeding), and similar forms of treatment and that if an individual is able to express their wishes, they may also make them known without the use of a Living Will, but in the event that an individual is not able to express their wishes themselves, a Living Will allows medical providers and the pt's family and friends ensure that they are not making decisions on the pt's behalf, but rather serving as the pt's voice to convey decisions the pt has already made.   The patient is aware that the decision to create an advance directive is theirs alone and they may chose not to complete the documents or may chose to complete one portion or both.  The patient was informed that they can revoke the documents at any time by striking through them and writing void or by completing new documents, but that it is also advisable that the individual verbally notify interested parties that their wishes have changed.  They are also aware that the document  must be signed in the presence of a notary public and two witnesses and that this can be done while the patient is still admitted to the hospital or after discharge in the community. If they decide to complete Advance Directives after being discharged from the hospital, they have been advised to  notify all interested parties and to provide those documents to their physicians and loved ones in addition to bringing them to the hospital in the event of another hospitalization.   The chaplain informed the pt that if they desire to proceed with completing Advance Directive Documentation while they are still admitted, notary services are typically available at [facility] between the hours of 1:00 and 3:30 Monday-Thursday.    When the patient is ready to have these documents completed, the patient should request that their nurse place a spiritual care consult and indicate that the patient is ready to have their advance directives notarized so that arrangements for witnesses and notary public can be made.  Please page spiritual care if the patient desires further education or has questions.     Maryanna Shape. Carley Hammed, M.Div. [facility] Chaplain Pager [phone] Office [phone]      12/30/22 1320  Spiritual Encounters  Type of Visit Initial  Care provided to: Patient  Referral source Patient request  Reason for visit Advance directives  Advance Directives (For Healthcare)  Does Patient Have a Medical Advance Directive? No  Would patient like information on creating a medical advance directive? Yes (Inpatient - patient defers creating a medical advance directive at this time - Information given)

## 2022-12-30 NOTE — Op Note (Signed)
[facility] Patient Name: Antonio Cervantes Procedure Date : 12/30/2022 MRN: [id number] Attending MD: Willaim Rayas. Adela Lank , MD, [id number] Date of Birth: [birth year] CSN: [id number] Age: 67 Admit Type: Inpatient Procedure:                Upper GI endoscopy Indications:              Melena / anemia - suspected upper gastrointestinal                            bleeding in the setting of Eliquis Providers:                Willaim Rayas. Adela Lank, MD, Adin Hector, RN, Marja Kays, Technician Referring MD:              Medicines:                Monitored Anesthesia Care Complications:            No immediate complications. Estimated blood loss:                            Minimal. Estimated Blood Loss:     Estimated blood loss was minimal. Procedure:                Pre-Anesthesia Assessment:                           - Prior to the procedure, a History and Physical                            was performed, and patient medications and                            allergies were reviewed. The patient's tolerance of                            previous anesthesia was also reviewed. The risks                            and benefits of the procedure and the sedation                            options and risks were discussed with the patient.                            All questions were answered, and informed consent                            was obtained. Prior Anticoagulants: The patient has                            taken Eliquis (apixaban), last dose was 2 days  prior to procedure. ASA Grade Assessment: III - A                            patient with severe systemic disease. After                            reviewing the risks and benefits, the patient was                            deemed in satisfactory condition to undergo the                            procedure.                           After obtaining informed consent,  the endoscope was                            passed under direct vision. Throughout the                            procedure, the patient's blood pressure, pulse, and                            oxygen saturations were monitored continuously. The                            [id number]-H190 ([id number]) Olympus endoscope was introduced                            through the mouth, and advanced to the second part                            of duodenum. The upper GI endoscopy was                            accomplished without difficulty. The patient                            tolerated the procedure well. Scope In: Scope Out: Findings:      Esophagogastric landmarks were identified: the Z-line was found at 41       cm, the gastroesophageal junction was found at 41 cm and the upper       extent of the gastric folds was found at 41 cm from the incisors.      The exam of the esophagus was otherwise normal.      Two non-bleeding superficial gastric ulcers were found in the gastric       fundus. The largest lesion was 4 mm in largest dimension and had a       darkly pigmented spot at the base of it. This was the suspected culprit       lesion for bleeding symptoms. One hemostatic clip was successfully       placed across the base of the ulceration.      Patchy moderate inflammation characterized by  erosions, friability and       shallow ulcerations was found in the gastric antrum.      The exam of the stomach was otherwise normal.      Biopsies were taken with a cold forceps for Helicobacter pylori testing.      The examined duodenum was normal. Impression:               - Esophagogastric landmarks identified.                           - Normal esophagus otherwise.                           - Non-bleeding gastric ulcers, largest with                            pigmented material at base. One clip was placed.                           - Gastritis with superficial erosions, small                             ulcerations, in the antrum. Biopsies taken to rule                            out H pylori                           - Normal examined duodenum.                           Bleeding symptoms very likely due to gastric ulcers                            in the setting of Eliquis Recommendation:           - Return patient to hospital ward for ongoing care.                           - Full liquid diet okay for today, advance tomorrow                            as tolerated.                           - Continue present medications.                           - Continue protonix 40mg  BID for now                           - Await course overnight, consideration for                            resumption of Eliquis in next few days                           -  Await pathology results.                           - We will reassess him tomorrow, call with                            questions in the interim. Trend Hgb, monitor for                            rebleeding Procedure Code(s):        --- Professional ---                           43255, 59, Esophagogastroduodenoscopy, flexible,                            transoral; with control of bleeding, any method                           43239, Esophagogastroduodenoscopy, flexible,                            transoral; with biopsy, single or multiple Diagnosis Code(s):        --- Professional ---                           K25.9, Gastric ulcer, unspecified as acute or                            chronic, without hemorrhage or perforation                           K29.70, Gastritis, unspecified, without bleeding                           K92.1, Melena (includes Hematochezia) CPT copyright 2022 American Medical Association. All rights reserved. The codes documented in this report are preliminary and upon coder review may  be revised to meet current compliance requirements. Viviann Spare P. Sundi Slevin, MD 12/30/2022 11:09:24 AM This report has been signed  electronically. Number of Addenda: 0

## 2022-12-30 NOTE — Progress Notes (Addendum)
Patient Name: Antonio Cervantes Date of Encounter: 12/30/2022 [facility] Cardiologist: None   Interval Summary  .    No complaints today, denies any chest pain or shortness of breath.  Plans for EGD/colonoscopy today.  Vital Signs .    Vitals:   12/30/22 0530 12/30/22 0545 12/30/22 0630 12/30/22 0728  BP: 137/61 (!) 153/55 139/80 (!) 145/70  Pulse: 64 77 77 71  Resp: 11 12 19 10   Temp:    98.3 F (36.8 C)  TempSrc:      SpO2: 100% 100% 99% 100%  Weight:      Height:        Intake/Output Summary (Last 24 hours) at 12/30/2022 0824 Last data filed at 12/30/2022 0400 Gross per 24 hour  Intake 788.48 ml  Output 600 ml  Net 188.48 ml      12/28/2022   11:13 AM 12/17/2022    2:04 PM 09/23/2022    3:12 PM  Last 3 Weights  Weight (lbs) 170 lb 170 lb 165 lb  Weight (kg) 77.111 kg 77.111 kg 74.844 kg      Telemetry/ECG    Normal sinus rhythm heart rate 70s to 80s- Personally Reviewed  CV Studies     Echocardiogram from 12/29/2022:   1. Left ventricular ejection fraction, by estimation, is 60 to 65%. The  left ventricle has normal function. The left ventricle has no regional  wall motion abnormalities. There is mild concentric left ventricular  hypertrophy. Left ventricular diastolic  parameters were normal.   2. Right ventricular systolic function is normal. The right ventricular  size is normal. Tricuspid regurgitation signal is inadequate for assessing  PA pressure.   3. The mitral valve is degenerative. Mild mitral valve regurgitation.   4. The aortic valve has been repaired/replaced. Aortic valve  regurgitation is not visualized. There is a 23 mm Edwards Inspiris Resilia  pericardial valve present in the aortic position. Aortic valve mean  gradient measures 12.0 mmHg.   5. The inferior vena cava is normal in size with <50% respiratory  variability, suggesting right atrial pressure of 8 mmHg.   Comparison(s): Prior images reviewed side by side. LVEF normal  range at  60-65%. Pericardial AVR with grossly normal function (dimentionless index  0.61), no aortic regurgitation. mean AV gradient has increased from 6-12  mmHg however since prior study.    Echo from 07/12/2022:  1. Left ventricular ejection fraction, by estimation, is 55 to 60%. The  left ventricle has normal function. The left ventricle has no regional  wall motion abnormalities. Left ventricular diastolic parameters are  indeterminate.   2. Right ventricular systolic function is mildly reduced. The right  ventricular size is normal. Tricuspid regurgitation signal is inadequate  for assessing PA pressure.   3. The mitral valve is normal in structure. Mild mitral valve  regurgitation. No evidence of mitral stenosis.   4. The aortic valve has been repaired/replaced. Aortic valve  regurgitation is not visualized. There is a 23 mm Edwards INSPIRIS RESILIA  pericardial valve valve present in the aortic position. Procedure Date:  07/29/21. Echo findings are consistent with  normal structure and function of the aortic valve prosthesis. Aortic valve  area, by VTI measures 2.02 cm. Aortic valve mean gradient measures 6.0  mmHg. Aortic valve Vmax measures 1.76 m/s.   5. The inferior vena cava is normal in size with greater than 50%  respiratory variability, suggesting right atrial pressure of 3 mmHg.      Right  and left heart cath from 05/17/2021:     Prox LAD to Mid LAD lesion is 75% stenosed.   Dist LAD lesion is 80% stenosed.   1st Diag lesion is 80% stenosed.   Ost RCA to Mid RCA lesion is 99% stenosed.   2nd Mrg lesion is 99% stenosed.   Dist Cx lesion is 90% stenosed.   Prox Cx to Mid Cx lesion is 75% stenosed.   LV end diastolic pressure is normal.   There is no aortic valve stenosis.   Severe, multivessel coronary artery disease.  Normal LVEDP.   Given diffuse, distal vessel disease, there are no good options for revascularization.  Plan for medical therapy to try to improve  his LV function.  Probable heart failure consultation as well.  Physical Exam .   GEN: No acute distress.   Neck: No JVD Cardiac: RRR, no murmurs, rubs, or gallops.  Respiratory: Clear to auscultation bilaterally. GI: Soft, nontender, non-distended  MS: No edema  Patient Profile    Antonio Cervantes is a 67 y.o. 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  Assessment & Plan .    Symptomatic anemia, possible GI bleed requiring PRBC infusions NSTEMI type II Multivessel CAD status post CABG x 4 07/2021 In the setting of acute blood loss anemia and in the absence of any significant cardiac symptoms such as chest pain or shortness of breath (per his report to me today, previous notes indicated otherwise) and normal echocardiogram there is a low suspicion for ACS at this time.  However, he did have ST depressions inferiorly.  In the setting of active GI bleed further ischemic evaluation would not be appropriate.  Can consider outpatient Myoview if there is still concern.  Plans for EGD/colonoscopy today. Continue to hold his aspirin and Eliquis.  May resume once GI clears  Ischemic cardiomyopathy Previous history of HFrEF EF to be 20 to 25% secondary to NSTEMI and at the time had multivessel disease.  Has had normalization of EF since June 2023 on multiple repeat echocardiograms including this admissions.  EF 60 to 65% with no regional wall motion abnormalities.  Normal RV function.  Overall does very well without any complaints of shortness of breath or peripheral edema.  GDMT has been limited by CKD. PTA Farxiga, Toprol-XL 25 mg.  Both of these medications have been held.  Atrial fibrillation Has been maintaining normal sinus rhythm.  Per chart review last EKG showing  atrial fibrillation was in January 2023 at the time of his NSTEMI.  If bleeding risk high or if recurrent issues with bleeding, eliquis may not be indicated given no documented recurrences.   Hyperlipidemia Continue atorvastatin 80 mg.  LDL 7 months ago 53.  Aortic valve replacement 07/2021 Mean gradient 12.  Stable on echo.  CKD, GI bleeding, hypothyroidism Per primary team.    For questions or updates, please contact  [facility] Please consult [email or web address] for contact info under        Signed, Abagail Kitchens, PA-C

## 2022-12-30 NOTE — Plan of Care (Signed)

## 2022-12-30 NOTE — Progress Notes (Signed)
PROGRESS NOTE        PATIENT DETAILS Name: Antonio Cervantes Age: 67 y.o. Sex: male Date of Birth: 1955-06-25 Admit Date: 12/28/2022 Admitting Physician Ripudeep Jenna Luo, MD NWG:[id number], Kathrene Bongo, FNP  Brief Summary: Patient is a 67 y.o.  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  See below for further details.  Significant events: 9/1>> admit to [facility]-symptomatic anemia-melanotic stools on digital rectal exam by EDP  Significant studies: 9/1>> CXR: No active disease 9/1>> CT head: No acute abnormalities 9/1>> CT chest/abdomen/pelvis: No evidence of acute injury in the chest/abdomen/pelvis. 9/2>> echo: EF 60-65%  Significant microbiology data: None  Procedures: None  Consults: GI  Subjective: No complaints-some dark-colored liquid appearing stools overnight with colonoscopy prep.  Objective: Vitals: Blood pressure 105/88, pulse 82, temperature 97.6 F (36.4 C), temperature source Temporal, resp. rate 18, height 5\' 6"  (1.676 m), weight 77.1 kg, SpO2 99%.   Exam: Gen Exam:Alert awake-not in any distress HEENT:atraumatic, normocephalic Chest: B/L clear to auscultation anteriorly CVS:S1S2 regular Abdomen:soft non tender, non distended Extremities:no edema Neurology: Non focal Skin: no rash  Pertinent Labs/Radiology:    Latest Ref Rng & Units 12/30/2022    8:25 AM 12/29/2022    2:48 PM 12/29/2022    2:59 AM  CBC  WBC 4.0 - 10.5 K/uL 14.2  13.6  15.4   Hemoglobin 13.0 - 17.0 g/dL 8.4  8.1  7.1   Hematocrit 39.0 - 52.0 % 24.6  23.8  20.9   Platelets 150 - 400 K/uL 215  182  184     Lab Results  Component Value Date   NA 140 12/30/2022   K 4.5  12/30/2022   CL 110 12/30/2022   CO2 18 (L) 12/30/2022      Assessment/Plan: Upper GI bleeding with acute blood loss anemia In the setting of Eliquis/aspirin/NSAID use Hb stable after total of 3 units of PRBC Black liquid stools overnight-suspect old blood given stability in hemoglobin EGD/colonoscopy scheduled for today Continue PPI Follow CBC.  Near syncope/generalized weakness/mechanical fall Secondary to symptomatic anemia/blood loss/probable orthostatic hypotension See above regarding plans to treat underlying GI bleed CT imaging negative for any acute injuries. Mobilize with PT/OT when able  PAF Anticoagulation held-in the setting of active GI bleed Beta-blocker on hold currently Telemetry monitoring  Type II non-STEMI Secondary to anemia/GI bleeding Echo with stable EF Appreciate cardiology input.  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  DM-2 (A1c 8.0 on 8/9) CBG stable with SSI Oral hypoglycemic agents on hold  Recent Labs    12/29/22 2334 12/30/22 0342 12/30/22 0725  GLUCAP 85 99 111*    CKD stage IV Creatinine close to baseline Significant elevation in BUN-but this is likely due to upper GI bleed CKD stage IIIb ruled out  Hypothyroidism Synthroid TSH minimally elevated-stable for close outpatient follow-up with PCP and repeat thyroid function test in 6-8 weeks.  BMI: Estimated body mass index is 27.44 kg/m as calculated  from the following:   Height as of this encounter: 5\' 6"  (1.676 m).   Weight as of this encounter: 77.1 kg.   Code status:   Code Status: Full Code   DVT Prophylaxis: SCDs Start: 12/28/22 1625   Family Communication: Daughter-Jessica [phone] VM on 9/3   Disposition Plan: Status is: Observation The patient will require care spanning > 2  midnights and should be moved to inpatient because: Severity of illness   Planned Discharge Destination:Home health   Diet: Diet Order             Diet NPO time specified  Diet effective now                     Antimicrobial agents: Anti-infectives (From admission, onward)    Start     Dose/Rate Route Frequency Ordered Stop   12/28/22 1345  cefTRIAXone (ROCEPHIN) 1 g in sodium chloride 0.9 % 100 mL IVPB        1 g 200 mL/hr over 30 Minutes Intravenous  Once 12/28/22 1344 12/28/22 1623        MEDICATIONS: Scheduled Meds:  [MAR Hold] atorvastatin  80 mg Oral Daily   [MAR Hold] insulin aspart  0-9 Units Subcutaneous Q4H   [MAR Hold] levothyroxine  112 mcg Oral Q0600   [MAR Hold] pantoprazole  40 mg Intravenous Q12H   Continuous Infusions:  sodium chloride     lactated ringers 1,000 mL (12/30/22 1003)   pantoprazole 8 mg/hr (12/30/22 0955)   PRN Meds:.[MAR Hold] acetaminophen **OR** [MAR Hold] acetaminophen, [MAR Hold]  morphine injection   I have personally reviewed following labs and imaging studies  LABORATORY DATA: CBC: Recent Labs  Lab 12/28/22 1158 12/28/22 1237 12/29/22 0259 12/29/22 1448 12/30/22 0825  WBC 13.3*  --  15.4* 13.6* 14.2*  NEUTROABS 10.6*  --   --   --   --   HGB 6.4* 7.1* 7.1* 8.1* 8.4*  HCT 19.6* 21.0* 20.9* 23.8* 24.6*  MCV 95.6  --  92.9 89.5 91.4  PLT 184  --  184 182 215    Basic Metabolic Panel: Recent Labs  Lab 12/28/22 1158 12/28/22 1237 12/29/22 0259 12/30/22 0825  NA 138 139 136 140  K 4.4 4.5 4.2 4.5  CL 106  --  111 110  CO2 19*  --  19* 18*  GLUCOSE 213*  --  146* 110*  BUN 103*  --  99* 68*  CREATININE 2.45*  --  2.61* 2.45*  CALCIUM 8.3*  --  8.3* 9.2    GFR: Estimated Creatinine Clearance: 28.6 mL/min (A) (by C-G formula based on SCr of 2.45 mg/dL (H)).  Liver Function Tests: Recent Labs  Lab 12/28/22 1158 12/29/22 0259  AST 15 18  ALT 15 13  ALKPHOS 59 52  BILITOT 0.6 0.5  PROT 6.1* 5.8*   ALBUMIN 3.1* 3.0*   No results for input(s): "LIPASE", "AMYLASE" in the last 168 hours. No results for input(s): "AMMONIA" in the last 168 hours.  Coagulation Profile: Recent Labs  Lab 12/28/22 1158  INR 1.9*    Cardiac Enzymes: No results for input(s): "CKTOTAL", "CKMB", "CKMBINDEX", "TROPONINI" in the last 168 hours.  BNP (last 3 results) No results for input(s): "PROBNP" in the last 8760 hours.  Lipid Profile: No results for input(s): "CHOL", "HDL", "LDLCALC", "TRIG", "CHOLHDL", "LDLDIRECT" in the last 72 hours.  Thyroid Function Tests: Recent Labs    12/29/22 0259  TSH 5.597*    Anemia Panel: Recent  Labs    12/29/22 1448  VITAMINB12 198  FOLATE 6.0    Urine analysis:    Component Value Date/Time   COLORURINE STRAW (A) 07/28/2021 2100   APPEARANCEUR CLEAR 07/28/2021 2100   LABSPEC 1.010 07/28/2021 2100   PHURINE 5.0 07/28/2021 2100   GLUCOSEU >=500 (A) 07/28/2021 2100   HGBUR NEGATIVE 07/28/2021 2100   BILIRUBINUR NEGATIVE 07/28/2021 2100   KETONESUR NEGATIVE 07/28/2021 2100   PROTEINUR NEGATIVE 07/28/2021 2100   NITRITE NEGATIVE 07/28/2021 2100   LEUKOCYTESUR NEGATIVE 07/28/2021 2100    Sepsis Labs: Lactic Acid, Venous No results found for: "LATICACIDVEN"  MICROBIOLOGY: No results found for this or any previous visit (from the past 240 hour(s)).  RADIOLOGY STUDIES/RESULTS: ECHOCARDIOGRAM COMPLETE  Result Date: 12/29/2022    ECHOCARDIOGRAM REPORT   Patient Name:   DAMANTE TREWIN Date of Exam: 12/29/2022 Medical Rec #:  [id number]         Height:       66.0 in Accession #:    [id number]        Weight:       170.0 lb Date of Birth:  [birth year]        BSA:          1.866 m Patient Age:    66 years          BP:           110/50 mmHg Patient Gender: M                 HR:           72 bpm. Exam Location:  Inpatient Procedure: 2D Echo, Color Doppler and Cardiac Doppler Indications:    Elevated Troponin  History:        Patient has prior history of  Echocardiogram examinations, most                 recent 07/12/2022. CHF, CAD, Prior CABG, Aortic Valve Disease and                 #23 Edwards Inspira Resilia AVR, Arrythmias:Atrial Fibrillation;                 Risk Factors:Diabetes.                 Aortic Valve: 23 mm Edwards Inspiris Resilia pericardial valve                 is present in the aortic position.  Sonographer:    Milbert Coulter Referring Phys: [id number] RIPUDEEP K RAI  Sonographer Comments: Image acquisition challenging due to patient body habitus. IMPRESSIONS  1. Left ventricular ejection fraction, by estimation, is 60 to 65%. The left ventricle has normal function. The left ventricle has no regional wall motion abnormalities. There is mild concentric left ventricular hypertrophy. Left ventricular diastolic parameters were normal.  2. Right ventricular systolic function is normal. The right ventricular size is normal. Tricuspid regurgitation signal is inadequate for assessing PA pressure.  3. The mitral valve is degenerative. Mild mitral valve regurgitation.  4. The aortic valve has been repaired/replaced. Aortic valve regurgitation is not visualized. There is a 23 mm Edwards Inspiris Resilia pericardial valve present in the aortic position. Aortic valve mean gradient measures 12.0 mmHg.  5. The inferior vena cava is normal in size with <50% respiratory variability, suggesting right atrial pressure of 8 mmHg. Comparison(s): Prior images reviewed side by side. LVEF normal range at 60-65%. Pericardial AVR with grossly normal function (  dimentionless index 0.61), no aortic regurgitation. mean AV gradient has increased from 6-12 mmHg however since prior study. FINDINGS  Left Ventricle: Left ventricular ejection fraction, by estimation, is 60 to 65%. The left ventricle has normal function. The left ventricle has no regional wall motion abnormalities. The left ventricular internal cavity size was normal in size. There is  mild concentric left ventricular  hypertrophy. Left ventricular diastolic parameters were normal. Right Ventricle: The right ventricular size is normal. No increase in right ventricular wall thickness. Right ventricular systolic function is normal. Tricuspid regurgitation signal is inadequate for assessing PA pressure. Left Atrium: Left atrial size was normal in size. Right Atrium: Right atrial size was normal in size. Pericardium: There is no evidence of pericardial effusion. Mitral Valve: The mitral valve is degenerative in appearance. Mild mitral valve regurgitation. Tricuspid Valve: The tricuspid valve is grossly normal. Tricuspid valve regurgitation is trivial. Aortic Valve: The aortic valve has been repaired/replaced. Aortic valve regurgitation is not visualized. Aortic valve mean gradient measures 12.0 mmHg. Aortic valve peak gradient measures 25.0 mmHg. Aortic valve area, by VTI measures 1.72 cm. There is a  23 mm Edwards Inspiris Resilia pericardial valve present in the aortic position. Pulmonic Valve: The pulmonic valve was grossly normal. Pulmonic valve regurgitation is trivial. Aorta: The aortic root is normal in size and structure. Venous: The inferior vena cava is normal in size with less than 50% respiratory variability, suggesting right atrial pressure of 8 mmHg. IAS/Shunts: No atrial level shunt detected by color flow Doppler.  LEFT VENTRICLE PLAX 2D LVIDd:         4.80 cm   Diastology LVIDs:         3.00 cm   LV e' medial:    7.72 cm/s LV PW:         1.20 cm   LV E/e' medial:  14.6 LV IVS:        1.20 cm   LV e' lateral:   12.00 cm/s LVOT diam:     1.90 cm   LV E/e' lateral: 9.4 LV SV:         85 LV SV Index:   46 LVOT Area:     2.84 cm  RIGHT VENTRICLE RV Basal diam:  3.40 cm RV S prime:     10.60 cm/s TAPSE (M-mode): 1.6 cm LEFT ATRIUM             Index        RIGHT ATRIUM           Index LA diam:        3.80 cm 2.04 cm/m   RA Area:     15.70 cm LA Vol (A2C):   33.0 ml 17.68 ml/m  RA Volume:   36.60 ml  19.61 ml/m LA Vol  (A4C):   37.0 ml 19.82 ml/m LA Biplane Vol: 35.6 ml 19.07 ml/m  AORTIC VALVE AV Area (Vmax):    1.76 cm AV Area (Vmean):   1.78 cm AV Area (VTI):     1.72 cm AV Vmax:           250.00 cm/s AV Vmean:          164.500 cm/s AV VTI:            0.494 m AV Peak Grad:      25.0 mmHg AV Mean Grad:      12.0 mmHg LVOT Vmax:         155.00 cm/s LVOT Vmean:  103.000 cm/s LVOT VTI:          0.300 m LVOT/AV VTI ratio: 0.61 MITRAL VALVE MV Area (PHT): 3.39 cm     SHUNTS MV Decel Time: 224 msec     Systemic VTI:  0.30 m MV E velocity: 113.00 cm/s  Systemic Diam: 1.90 cm MV A velocity: 85.40 cm/s MV E/A ratio:  1.32 Nona Dell MD Electronically signed by Nona Dell MD Signature Date/Time: 12/29/2022/1:31:31 PM    Final    CT Head Wo Contrast  Result Date: 12/28/2022 CLINICAL DATA:  Ataxia, head trauma with pain. EXAM: CT HEAD WITHOUT CONTRAST TECHNIQUE: Contiguous axial images were obtained from the base of the skull through the vertex without intravenous contrast. RADIATION DOSE REDUCTION: This exam was performed according to the departmental dose-optimization program which includes automated exposure control, adjustment of the mA and/or kV according to patient size and/or use of iterative reconstruction technique. COMPARISON:  None Available. 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 There is mild cerebral volume loss with associated ex vacuo dilatation. No mass effect or midline shift. The basilar cisterns are patent. Periventricular white matter hypoattenuation likely represents chronic small vessel ischemic disease. Vascular: There are vascular calcifications in the carotid siphons. Skull: Normal. Negative for fracture or focal lesion. Sinuses/Orbits: There is mild left sphenoid sinus disease. Other: None. IMPRESSION: Small volume area of hypoattenuation of the right occipital cortex, favored to  reflect a chronic infarct/encephalomalacia. If clinically warranted, MRI brain could be performed for further characterization. Electronically Signed   By: Romona Curls M.D.   On: 12/28/2022 14:10   CT CHEST ABDOMEN PELVIS W CONTRAST  Result Date: 12/28/2022 CLINICAL DATA:  Poly trauma, blunt EXAM: CT CHEST, ABDOMEN, AND PELVIS WITH CONTRAST TECHNIQUE: Multidetector CT imaging of the chest, abdomen and pelvis was performed following the standard protocol during bolus administration of intravenous contrast. RADIATION DOSE REDUCTION: This exam was performed according to the departmental dose-optimization program which includes automated exposure control, adjustment of the mA and/or kV according to patient size and/or use of iterative reconstruction technique. CONTRAST:  75mL OMNIPAQUE IOHEXOL 350 MG/ML SOLN COMPARISON:  Recent prior esophagram 12/23/2022; prior CT scan of the abdomen and pelvis 07/07/2016; prior CT scan of the chest 09/16/2017 FINDINGS: CT CHEST FINDINGS Cardiovascular: Conventional 3 vessel arch anatomy. No evidence of aortic aneurysm or dissection. 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 The heart is normal in size. Main pulmonary artery is unremarkable. No pericardial effusion. Mediastinum/Nodes: Unremarkable CT appearance of the thyroid gland. No suspicious mediastinal or hilar adenopathy. No soft tissue mediastinal mass. The thoracic esophagus is unremarkable. Lungs/Pleura: Lungs are clear. No pleural effusion or pneumothorax. Musculoskeletal: No acute fracture or aggressive appearing lytic or blastic osseous lesion. Healed median sternotomy. CT ABDOMEN PELVIS FINDINGS Overall imaging quality of the abdomen and pelvis is moderately limited due to extensive streak artifact from barium throughout the colon which is likely the residual of the recent prior esophagram. Hepatobiliary: Normal hepatic contour and morphology.  Gallbladder is unremarkable. No intra or extrahepatic biliary ductal dilatation. Pancreas: Unremarkable. No pancreatic ductal dilatation or surrounding inflammatory changes. Spleen: Normal in size without focal abnormality. Adrenals/Urinary Tract: The adrenal glands are normal. No hydronephrosis, nephrolithiasis or enhancing renal mass. Ureters and bladder are grossly normal. Stomach/Bowel: High attenuation material present throughout the entire colon from the cecum to the anus. No evidence of bowel  obstruction. No focal bowel wall thickening. Vascular/Lymphatic: Atherosclerotic calcifications throughout the abdominal aorta. No evidence of aneurysm. No suspicious lymphadenopathy. Reproductive: The prostate gland is unremarkable. Other: No evidence of ascites. No significant abdominal wall hernia. Musculoskeletal: No acute fracture or malalignment. Mild multilevel degenerative changes. IMPRESSION: 1. No evidence of acute injury to the chest, abdomen or pelvis. 2. Aortic and coronary artery atherosclerotic vascular calcifications. 3. Surgical changes of prior median sternotomy with aortic valve replacement and multivessel CABG. 4. Evaluation of the intra-abdominal and pelvic structures is somewhat limited due to extensive streak artifact from high density barium throughout the colon. 5. Mild multilevel degenerative changes throughout the spine. Electronically Signed   By: Malachy Moan M.D.   On: 12/28/2022 13:28   DG Chest Portable 1 View  Result Date: 12/28/2022 CLINICAL DATA:  Dyspnea. EXAM: PORTABLE CHEST 1 VIEW COMPARISON:  09/15/2022 FINDINGS: The lungs are clear without focal pneumonia, edema, pneumothorax or pleural effusion. The cardiopericardial silhouette is within normal limits for size. No acute bony abnormality. Telemetry leads overlie the chest. IMPRESSION: No active disease. Electronically Signed   By: Kennith Center M.D.   On: 12/28/2022 12:28     LOS: 1 day   Jeoffrey Massed, MD  [facility]    To contact the attending provider between 7A-7P or the covering provider during after hours 7P-7A, please log into the web site [email or web address] and access using universal [place] password for that web site. If you do not have the password, please call the hospital operator.  12/30/2022, 11:08 AM

## 2022-12-30 NOTE — Progress Notes (Signed)
Brief GI note: Cardiac clearance received.  Eliquis remains on hold.  No chest pain or shortness of breath overnight or this morning.  Hemodynamically stable.  Patient tolerated the full bowel prep and endorsed passing nonbloody water per the rectum this morning. Patient to proceed with EGD and colonoscopy with Dr. Adela Lank as scheduled later this morning.

## 2022-12-30 NOTE — Progress Notes (Signed)
   12/30/22 1624  TOC Brief Assessment  Insurance and Status Reviewed [facility] Medicare)  Patient has primary care physician Yes Vear Clock, Kathrene Bongo, FNP)  Home environment has been reviewed From Home  Prior level of function: Independent  Prior/Current Home Services No current home services  Social Determinants of Health Reivew SDOH reviewed no interventions necessary  Readmission risk has been reviewed Yes (13%)  Transition of care needs no transition of care needs at this time    [facility] will continue to follow patient for any additional discharge needs

## 2022-12-30 NOTE — Op Note (Signed)
[facility] Patient Name: Antonio Cervantes Procedure Date : 12/30/2022 MRN: [id number] Attending MD: Willaim Rayas. Adela Lank , MD, [id number] Date of Birth: 1956/03/26 CSN: [id number] Age: 67 Admit Type: Inpatient Procedure:                Colonoscopy Indications:              Recent GI bleeding in the setting of Eliquis,                            anemia. Dark stools. No prior colonoscopy. Providers:                Willaim Rayas. Adela Lank, MD, Adin Hector, RN, Marja Kays, Technician Referring MD:              Medicines:                Monitored Anesthesia Care Complications:            No immediate complications. Estimated blood loss:                            None. Estimated Blood Loss:     Estimated blood loss: none. Procedure:                Pre-Anesthesia Assessment:                           - Prior to the procedure, a History and Physical                            was performed, and patient medications and                            allergies were reviewed. The patient's tolerance of                            previous anesthesia was also reviewed. The risks                            and benefits of the procedure and the sedation                            options and risks were discussed with the patient.                            All questions were answered, and informed consent                            was obtained. Prior Anticoagulants: The patient has                            taken Eliquis (apixaban), last dose was 2 days  prior to procedure. ASA Grade Assessment: III - A                            patient with severe systemic disease. After                            reviewing the risks and benefits, the patient was                            deemed in satisfactory condition to undergo the                            procedure.                           After obtaining informed consent, the  colonoscope                            was passed under direct vision. Throughout the                            procedure, the patient's blood pressure, pulse, and                            oxygen saturations were monitored continuously. The                            CF-HQ190L ([id number] Olympus colonoscope was                            introduced through the anus and advanced to the the                            cecum, identified by the ileocecal valve. The                            colonoscopy was performed without difficulty. The                            patient tolerated the procedure well. The quality                            of the bowel preparation was fair. The ileocecal                            valve and the rectum were photographed. Scope In: 10:42:09 AM Scope Out: 10:57:00 AM Scope Withdrawal Time: 0 hours 10 minutes 46 seconds  Total Procedure Duration: 0 hours 14 minutes 51 seconds  Findings:      The perianal and digital rectal examinations were normal.      A large amount of liquid stool was found in the entire colon, making       visualization difficult. Lavage of the colon performed using copious       amounts of sterile water, resulting in clearance with fair  visualization       in most areas. Cecal cap was the worst affected and was difficult to       clear entirely AO was not well visualized. No large mass lesions or       large polyps noted - prep was not adequate to rule out very small or       flat polyps in some areas.      Anal papilla(e) were hypertrophied.      The exam was otherwise without abnormality. No cause for anemia /       bleeding on this exam. Impression:               - Preparation of the colon was fair. Following                            lavage was able to see most areas with fair to                            adequate visualization, could not clear cecal cap.                           - Anal papilla(e) were hypertrophied.                            - The examination was otherwise normal.                           No pathology noted in the entire colon for cause of                            anemia / bleeding. Brown stool noted throughout.                            EGD findings are the most likely cause of anemia /                            bleeding symptoms. Recommendation:           - Return patient to hospital ward for ongoing care.                           - Clear liquid diet.                           - Continue present medications.                           - Full recommendations per EGD note                           - Recommend outpatient colonoscopy for screening                            purposes with double prep, once patient has  recovered from acute issues Procedure Code(s):        --- Professional ---                           [phone], Colonoscopy, flexible; diagnostic, including                            collection of specimen(s) by brushing or washing,                            when performed (separate procedure) Diagnosis Code(s):        --- Professional ---                           K62.89, Other specified diseases of anus and rectum                           K92.2, Gastrointestinal hemorrhage, unspecified CPT copyright 2022 American [employer]. All rights reserved. The codes documented in this report are preliminary and upon coder review may  be revised to meet current compliance requirements. Viviann Spare P. Ronia Hazelett, MD 12/30/2022 11:04:17 AM This report has been signed electronically. Number of Addenda: 0

## 2022-12-31 DIAGNOSIS — Z7901 Long term (current) use of anticoagulants: Secondary | ICD-10-CM | POA: Diagnosis not present

## 2022-12-31 DIAGNOSIS — K259 Gastric ulcer, unspecified as acute or chronic, without hemorrhage or perforation: Secondary | ICD-10-CM | POA: Diagnosis not present

## 2022-12-31 DIAGNOSIS — D62 Acute posthemorrhagic anemia: Secondary | ICD-10-CM | POA: Diagnosis not present

## 2022-12-31 DIAGNOSIS — K922 Gastrointestinal hemorrhage, unspecified: Secondary | ICD-10-CM | POA: Diagnosis not present

## 2022-12-31 LAB — PREPARE RBC (CROSSMATCH)

## 2022-12-31 LAB — CBC
HCT: 22.7 % — ABNORMAL LOW (ref 39.0–52.0)
Hemoglobin: 7.6 g/dL — ABNORMAL LOW (ref 13.0–17.0)
MCH: 30.4 pg (ref 26.0–34.0)
MCHC: 33.5 g/dL (ref 30.0–36.0)
MCV: 90.8 fL (ref 80.0–100.0)
Platelets: 215 10*3/uL (ref 150–400)
RBC: 2.5 MIL/uL — ABNORMAL LOW (ref 4.22–5.81)
RDW: 17.1 % — ABNORMAL HIGH (ref 11.5–15.5)
WBC: 11.6 10*3/uL — ABNORMAL HIGH (ref 4.0–10.5)
nRBC: 0 % (ref 0.0–0.2)

## 2022-12-31 LAB — GLUCOSE, CAPILLARY
Glucose-Capillary: 143 mg/dL — ABNORMAL HIGH (ref 70–99)
Glucose-Capillary: 95 mg/dL (ref 70–99)
Glucose-Capillary: 169 mg/dL — ABNORMAL HIGH (ref 70–99)
Glucose-Capillary: 117 mg/dL — ABNORMAL HIGH (ref 70–99)
Glucose-Capillary: 180 mg/dL — ABNORMAL HIGH (ref 70–99)
Glucose-Capillary: 139 mg/dL — ABNORMAL HIGH (ref 70–99)

## 2022-12-31 LAB — BASIC METABOLIC PANEL
Anion gap: 7 (ref 5–15)
BUN: 46 mg/dL — ABNORMAL HIGH (ref 8–23)
CO2: 20 mmol/L — ABNORMAL LOW (ref 22–32)
Calcium: 8.7 mg/dL — ABNORMAL LOW (ref 8.9–10.3)
Chloride: 109 mmol/L (ref 98–111)
Creatinine, Ser: 2.16 mg/dL — ABNORMAL HIGH (ref 0.61–1.24)
GFR, Estimated: 33 mL/min — ABNORMAL LOW (ref >60)
Glucose, Bld: 108 mg/dL — ABNORMAL HIGH (ref 70–99)
Potassium: 3.8 mmol/L (ref 3.5–5.1)
Sodium: 136 mmol/L (ref 135–145)

## 2022-12-31 LAB — HEMOGLOBIN AND HEMATOCRIT, BLOOD
HCT: 28.9 % — ABNORMAL LOW (ref 39.0–52.0)
Hemoglobin: 9.9 g/dL — ABNORMAL LOW (ref 13.0–17.0)

## 2022-12-31 MED ORDER — FUROSEMIDE 10 MG/ML IJ SOLN
20.0000 mg | Freq: Once | INTRAMUSCULAR | Status: AC
  Administered 2022-12-31: 20 mg via INTRAVENOUS
  Filled 2022-12-31: qty 2

## 2022-12-31 MED ORDER — SODIUM CHLORIDE 0.9% IV SOLUTION: Freq: Once | INTRAVENOUS | Status: AC

## 2022-12-31 MED ORDER — PANTOPRAZOLE SODIUM 40 MG IV SOLR: 40.0000 mg | Freq: Two times a day (BID) | INTRAVENOUS | Status: DC

## 2022-12-31 MED ORDER — ACETAMINOPHEN 325 MG PO TABS
650.0000 mg | ORAL_TABLET | Freq: Once | ORAL | Status: AC
  Administered 2022-12-31: 650 mg via ORAL
  Filled 2022-12-31: qty 2

## 2022-12-31 MED ORDER — PANTOPRAZOLE SODIUM 40 MG PO TBEC
40.0000 mg | DELAYED_RELEASE_TABLET | Freq: Two times a day (BID) | ORAL | Status: DC
  Administered 2022-12-31 – 2023-01-01 (×3): 40 mg via ORAL
  Filled 2022-12-31 (×3): qty 1

## 2022-12-31 MED ORDER — DIPHENHYDRAMINE HCL 25 MG PO CAPS
25.0000 mg | ORAL_CAPSULE | Freq: Once | ORAL | Status: AC
  Administered 2022-12-31: 25 mg via ORAL
  Filled 2022-12-31: qty 1

## 2022-12-31 MED ORDER — METOPROLOL SUCCINATE ER 25 MG PO TB24
25.0000 mg | ORAL_TABLET | Freq: Every day | ORAL | Status: DC
  Administered 2023-01-01: 25 mg via ORAL
  Filled 2022-12-31 (×2): qty 1

## 2022-12-31 NOTE — Plan of Care (Signed)

## 2022-12-31 NOTE — Progress Notes (Signed)
PROGRESS NOTE        PATIENT DETAILS Name: Antonio Cervantes Age: 67 y.o. Sex: male Date of Birth: 1955-08-05 Admit Date: 12/28/2022 Admitting Physician Ripudeep Jenna Luo, MD ZOX:[id number], Kathrene Bongo, FNP  Brief Summary: Patient is a 67 y.o.  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 on PPI-given 2 units of PRBC.  See below for further details.  Significant events: 9/1>> admit to [facility]-symptomatic anemia-melanotic stools on digital rectal exam by EDP  Significant studies: 9/1>> CXR: No active disease 9/1>> CT head: No acute abnormalities 9/1>> CT chest/abdomen/pelvis: No evidence of acute injury in the chest/abdomen/pelvis. 9/2>> echo: EF 60-65%  Significant microbiology data: None  Procedures: 9/3>> EGD: Nonbleeding gastric ulcer-largest with pigmented material at base-clip was placed.  9/3>> colonoscopy: No mass/polyps noted.  Consults: GI Cardiology  Subjective: No BM overnight-no chest pain or shortness of breath.  Objective: Vitals: Blood pressure (!) 152/68, pulse 71, temperature 98.3 F (36.8 C), temperature source Oral, resp. rate 17, height 5\' 6"  (1.676 m), weight 73.9 kg, SpO2 96%.   Exam: Gen Exam:Alert awake-not in any distress HEENT:atraumatic, normocephalic Chest: B/L clear to auscultation anteriorly CVS:S1S2 regular Abdomen:soft non tender, non distended Extremities:no edema Neurology: Non focal Skin: no rash  Pertinent Labs/Radiology:    Latest Ref Rng & Units 12/31/2022    6:38 AM 12/30/2022    6:05 PM 12/30/2022    8:25 AM  CBC  WBC 4.0 - 10.5 K/uL 11.6  11.3  14.2   Hemoglobin 13.0 - 17.0 g/dL 7.6  8.1  8.4   Hematocrit 39.0 - 52.0 % 22.7  23.7  24.6    Platelets 150 - 400 K/uL 215  207  215     Lab Results  Component Value Date   NA 136 12/31/2022   K 3.8 12/31/2022   CL 109 12/31/2022   CO2 20 (L) 12/31/2022      Assessment/Plan: Upper GI bleeding with acute blood loss anemia In the setting of Eliquis/aspirin/NSAID use Drop in Hb today-this is likely from equilibration rather than recurrent GI bleeding.  Given type II non-STEMI-will transfuse 1 unit of PRBCs in an attempt to keep Hb more than 8.   Change PPI to twice daily dosing GI recommending twice daily Protonix x 4 weeks, then once daily. Plan is to watch him overnight-if stable-likely home on 9/5 Avoid NSAIDs in the future. Follow CBC.  Near syncope/generalized weakness/mechanical fall Secondary to symptomatic anemia/blood loss/probable orthostatic hypotension See above regarding plans to treat underlying GI bleed CT imaging negative for any acute injuries. Mobilize with PT/OT when able  PAF Anticoagulation held-in the setting of active GI bleed Discussed with Dr. Adela Lank 9/4-okay to resume Eliquis on 9/8 Resume metoprolol today.  Type II non-STEMI Secondary to anemia/GI bleeding Echo with stable EF Appreciate cardiology input.  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  Aortic valve replacement-April 2023 Echo with stable prosthetic valve.  DM-2 (A1c 8.0 on 8/9) CBG stable with SSI Oral hypoglycemic agents on hold  Recent Labs    12/30/22 2300 12/31/22 0356 12/31/22 0742  GLUCAP 150* 143* 95    CKD stage IV Creatinine close to baseline Significant elevation in BUN-but this is likely due to upper GI bleed CKD stage IIIb ruled out  Hypothyroidism Synthroid TSH minimally elevated-stable for close outpatient follow-up with PCP and repeat thyroid function test in 6-8  weeks.  BMI: Estimated body mass index is 26.3 kg/m as calculated from the following:   Height as of this encounter: 5\' 6"  (1.676 m).   Weight as of this encounter: 73.9 kg.   Code status:   Code Status: Full Code   DVT Prophylaxis: SCDs Start: 12/28/22 1625   Family Communication: Daughter-Jessica [phone]-[id number] VM on 9/4   Disposition Plan: Status is: Observation The patient will require care spanning > 2 midnights and should be moved to inpatient because: Severity of illness   Planned Discharge Destination:Home health likely on 9/5   Diet: Diet Order             Diet full liquid Fluid consistency: Thin  Diet effective now                     Antimicrobial agents: Anti-infectives (From admission, onward)    Start     Dose/Rate Route Frequency Ordered Stop   12/28/22 1345  cefTRIAXone (ROCEPHIN) 1 g in sodium chloride 0.9 % 100 mL IVPB        1 g 200 mL/hr over 30 Minutes Intravenous  Once 12/28/22 1344 12/28/22 1623        MEDICATIONS: Scheduled Meds:  atorvastatin  80 mg Oral Daily   furosemide  20 mg Intravenous Once   insulin aspart  0-9 Units Subcutaneous Q4H   levothyroxine  112 mcg Oral Q0600   [START ON 01/01/2023] pantoprazole  40 mg Intravenous Q12H   Continuous Infusions:  pantoprazole 8 mg/hr (12/31/22 0856)   PRN Meds:.acetaminophen **OR** acetaminophen, morphine injection   I have personally reviewed following labs and imaging studies  LABORATORY DATA: CBC: Recent Labs  Lab 12/28/22 1158 12/28/22 1237 12/29/22 0259 12/29/22 1448 12/30/22 0825 12/30/22 1805 12/31/22 0638  WBC 13.3*  --  15.4* 13.6* 14.2* 11.3* 11.6*  NEUTROABS 10.6*  --   --   --   --   --   --   HGB 6.4*   < > 7.1* 8.1* 8.4* 8.1* 7.6*  HCT 19.6*   < > 20.9* 23.8* 24.6* 23.7* 22.7*  MCV 95.6  --  92.9 89.5 91.4 91.9 90.8  PLT 184  --  184 182 215 207 215   < > = values in this interval not displayed.    Basic Metabolic Panel: Recent Labs  Lab  12/28/22 1158 12/28/22 1237 12/29/22 0259 12/30/22 0825 12/31/22 0638  NA 138 139 136 140 136  K 4.4 4.5 4.2 4.5 3.8  CL 106  --  111 110 109  CO2 19*  --  19* 18* 20*  GLUCOSE 213*  --  146* 110* 108*  BUN 103*  --  99* 68* 46*  CREATININE 2.45*  --  2.61* 2.45* 2.16*  CALCIUM 8.3*  --  8.3* 9.2 8.7*    GFR: Estimated Creatinine Clearance: 29.9 mL/min (A) (by C-G formula based on SCr of 2.16 mg/dL (H)).  Liver Function Tests: Recent Labs  Lab 12/28/22 1158 12/29/22 0259  AST 15 18  ALT 15 13  ALKPHOS 59 52  BILITOT 0.6 0.5  PROT 6.1* 5.8*  ALBUMIN  3.1* 3.0*   No results for input(s): "LIPASE", "AMYLASE" in the last 168 hours. No results for input(s): "AMMONIA" in the last 168 hours.  Coagulation Profile: Recent Labs  Lab 12/28/22 1158  INR 1.9*    Cardiac Enzymes: No results for input(s): "CKTOTAL", "CKMB", "CKMBINDEX", "TROPONINI" in the last 168 hours.  BNP (last 3 results) No results for input(s): "PROBNP" in the last 8760 hours.  Lipid Profile: No results for input(s): "CHOL", "HDL", "LDLCALC", "TRIG", "CHOLHDL", "LDLDIRECT" in the last 72 hours.  Thyroid Function Tests: Recent Labs    12/29/22 0259  TSH 5.597*    Anemia Panel: Recent Labs    12/29/22 1448  VITAMINB12 198  FOLATE 6.0    Urine analysis:    Component Value Date/Time   COLORURINE STRAW (A) 07/28/2021 2100   APPEARANCEUR CLEAR 07/28/2021 2100   LABSPEC 1.010 07/28/2021 2100   PHURINE 5.0 07/28/2021 2100   GLUCOSEU >=500 (A) 07/28/2021 2100   HGBUR NEGATIVE 07/28/2021 2100   BILIRUBINUR NEGATIVE 07/28/2021 2100   KETONESUR NEGATIVE 07/28/2021 2100   PROTEINUR NEGATIVE 07/28/2021 2100   NITRITE NEGATIVE 07/28/2021 2100   LEUKOCYTESUR NEGATIVE 07/28/2021 2100    Sepsis Labs: Lactic Acid, Venous No results found for: "LATICACIDVEN"  MICROBIOLOGY: No results found for this or any previous visit (from the past 240 hour(s)).  RADIOLOGY STUDIES/RESULTS: ECHOCARDIOGRAM  COMPLETE  Result Date: 12/29/2022    ECHOCARDIOGRAM REPORT   Patient Name:   TYLERJAMES VENTURI Date of Exam: 12/29/2022 Medical Rec #:  [id number]         Height:       66.0 in Accession #:    [id number]        Weight:       170.0 lb Date of Birth:  1955-05-04        BSA:          1.866 m Patient Age:    66 years          BP:           110/50 mmHg Patient Gender: M                 HR:           72 bpm. Exam Location:  Inpatient Procedure: 2D Echo, Color Doppler and Cardiac Doppler Indications:    Elevated Troponin  History:        Patient has prior history of Echocardiogram examinations, most                 recent 07/12/2022. CHF, CAD, Prior CABG, Aortic Valve Disease and                 #23 Edwards Inspira Resilia AVR, Arrythmias:Atrial Fibrillation;                 Risk Factors:Diabetes.                 Aortic Valve: 23 mm Edwards Inspiris Resilia pericardial valve                 is present in the aortic position.  Sonographer:    Milbert Coulter Referring Phys: [id number] RIPUDEEP K RAI  Sonographer Comments: Image acquisition challenging due to patient body habitus. IMPRESSIONS  1. Left ventricular ejection fraction, by estimation, is 60 to 65%. The left ventricle has normal function. The left ventricle has no regional wall motion abnormalities. There is mild concentric left ventricular hypertrophy. Left ventricular diastolic parameters were normal.  2. Right ventricular systolic function is normal. The right ventricular size is normal. Tricuspid regurgitation signal is inadequate for assessing PA pressure.  3. The mitral valve is degenerative. Mild mitral valve regurgitation.  4. The aortic valve has been repaired/replaced. Aortic valve regurgitation is not visualized. There is a 23 mm Edwards Inspiris Resilia pericardial valve present in the aortic position. Aortic valve mean gradient measures 12.0 mmHg.  5. The inferior vena cava is normal in size with <50% respiratory variability, suggesting right atrial pressure of  8 mmHg. Comparison(s): Prior images reviewed side by side. LVEF normal range at 60-65%. Pericardial AVR with grossly normal function (dimentionless index 0.61), no aortic regurgitation. mean AV gradient has increased from 6-12 mmHg however since prior study. FINDINGS  Left Ventricle: Left ventricular ejection fraction, by estimation, is 60 to 65%. The left ventricle has normal function. The left ventricle has no regional wall motion abnormalities. The left ventricular internal cavity size was normal in size. There is  mild concentric left ventricular hypertrophy. Left ventricular diastolic parameters were normal. Right Ventricle: The right ventricular size is normal. No increase in right ventricular wall thickness. Right ventricular systolic function is normal. Tricuspid regurgitation signal is inadequate for assessing PA pressure. Left Atrium: Left atrial size was normal in size. Right Atrium: Right atrial size was normal in size. Pericardium: There is no evidence of pericardial effusion. Mitral Valve: The mitral valve is degenerative in appearance. Mild mitral valve regurgitation. Tricuspid Valve: The tricuspid valve is grossly normal. Tricuspid valve regurgitation is trivial. Aortic Valve: The aortic valve has been repaired/replaced. Aortic valve regurgitation is not visualized. Aortic valve mean gradient measures 12.0 mmHg. Aortic valve peak gradient measures 25.0 mmHg. Aortic valve area, by VTI measures 1.72 cm. There is a  23 mm Edwards Inspiris Resilia pericardial valve present in the aortic position. Pulmonic Valve: The pulmonic valve was grossly normal. Pulmonic valve regurgitation is trivial. Aorta: The aortic root is normal in size and structure. Venous: The inferior vena cava is normal in size with less than 50% respiratory variability, suggesting right atrial pressure of 8 mmHg. IAS/Shunts: No atrial level shunt detected by color flow Doppler.  LEFT VENTRICLE PLAX 2D LVIDd:         4.80 cm   Diastology  LVIDs:         3.00 cm   LV e' medial:    7.72 cm/s LV PW:         1.20 cm   LV E/e' medial:  14.6 LV IVS:        1.20 cm   LV e' lateral:   12.00 cm/s LVOT diam:     1.90 cm   LV E/e' lateral: 9.4 LV SV:         85 LV SV Index:   46 LVOT Area:     2.84 cm  RIGHT VENTRICLE RV Basal diam:  3.40 cm RV S prime:     10.60 cm/s TAPSE (M-mode): 1.6 cm LEFT ATRIUM             Index        RIGHT ATRIUM           Index LA diam:        3.80 cm 2.04 cm/m   RA Area:     15.70 cm LA Vol (A2C):   33.0 ml 17.68 ml/m  RA Volume:   36.60 ml  19.61 ml/m LA Vol (A4C):   37.0 ml 19.82 ml/m LA Biplane Vol: 35.6  ml 19.07 ml/m  AORTIC VALVE AV Area (Vmax):    1.76 cm AV Area (Vmean):   1.78 cm AV Area (VTI):     1.72 cm AV Vmax:           250.00 cm/s AV Vmean:          164.500 cm/s AV VTI:            0.494 m AV Peak Grad:      25.0 mmHg AV Mean Grad:      12.0 mmHg LVOT Vmax:         155.00 cm/s LVOT Vmean:        103.000 cm/s LVOT VTI:          0.300 m LVOT/AV VTI ratio: 0.61 MITRAL VALVE MV Area (PHT): 3.39 cm     SHUNTS MV Decel Time: 224 msec     Systemic VTI:  0.30 m MV E velocity: 113.00 cm/s  Systemic Diam: 1.90 cm MV A velocity: 85.40 cm/s MV E/A ratio:  1.32 Nona Dell MD Electronically signed by Nona Dell MD Signature Date/Time: 12/29/2022/1:31:31 PM    Final      LOS: 2 days   Jeoffrey Massed, MD  [facility]    To contact the attending provider between 7A-7P or the covering provider during after hours 7P-7A, please log into the web site [email or web address] and access using universal [place] password for that web site. If you do not have the password, please call the hospital operator.  12/31/2022, 11:16 AM

## 2022-12-31 NOTE — Progress Notes (Signed)
Progress Note   Subjective  Patient doing well this AM. No bleeding post procedure. He is tolerating full liquids, wants to eat. No pain. No chest pains / shortness of breath.   Objective   Vital signs in last 24 hours: Temp:  [96.6 F (35.9 C)-98.5 F (36.9 C)] 98.2 F (36.8 C) (09/04 0353) Pulse Rate:  [63-82] 71 (09/04 0430) Resp:  [10-21] 16 (09/04 0430) BP: (105-155)/(40-88) 116/51 (09/04 0430) SpO2:  [95 %-100 %] 95 % (09/04 0430) Weight:  [73.9 kg] 73.9 kg (09/04 0353) Last BM Date : 12/30/22 General:    white male in NAD Neurologic:  Alert and oriented,  grossly normal neurologically. Psych:  Cooperative. Normal mood and affect.  Intake/Output from previous day: 09/03 0701 - 09/04 0700 In: 518.1 [I.V.:518.1] Out: 500 [Urine:500] Intake/Output this shift: Total I/O In: 117.1 [I.V.:117.1] Out: 450 [Urine:450]  Lab Results: Recent Labs    12/30/22 0825 12/30/22 1805 12/31/22 0638  WBC 14.2* 11.3* 11.6*  HGB 8.4* 8.1* 7.6*  HCT 24.6* 23.7* 22.7*  PLT 215 207 215   BMET Recent Labs    12/29/22 0259 12/30/22 0825 12/31/22 0638  NA 136 140 136  K 4.2 4.5 3.8  CL 111 110 109  CO2 19* 18* 20*  GLUCOSE 146* 110* 108*  BUN 99* 68* 46*  CREATININE 2.61* 2.45* 2.16*  CALCIUM 8.3* 9.2 8.7*   LFT Recent Labs    12/29/22 0259  PROT 5.8*  ALBUMIN 3.0*  AST 18  ALT 13  ALKPHOS 52  BILITOT 0.5   PT/INR Recent Labs    12/28/22 1158  LABPROT 21.8*  INR 1.9*    Studies/Results: ECHOCARDIOGRAM COMPLETE  Result Date: 12/29/2022    ECHOCARDIOGRAM REPORT   Patient Name:   FAMOUS SPELLER Date of Exam: 12/29/2022 Medical Rec #:  [id number]         Height:       66.0 in Accession #:    [id number]        Weight:       170.0 lb Date of Birth:  [birth year]        BSA:          1.866 m Patient Age:    66 years          BP:           110/50 mmHg Patient Gender: M                 HR:           72 bpm. Exam Location:  Inpatient Procedure: 2D Echo, Color  Doppler and Cardiac Doppler Indications:    Elevated Troponin  History:        Patient has prior history of Echocardiogram examinations, most                 recent 07/12/2022. CHF, CAD, Prior CABG, Aortic Valve Disease and                 #23 Edwards Inspira Resilia AVR, Arrythmias:Atrial Fibrillation;                 Risk Factors:Diabetes.                 Aortic Valve: 23 mm Edwards Inspiris Resilia pericardial valve                 is present in the aortic position.  Sonographer:    Milbert Coulter Referring  Phys: [id number] RIPUDEEP K RAI  Sonographer Comments: Image acquisition challenging due to patient body habitus. IMPRESSIONS  1. Left ventricular ejection fraction, by estimation, is 60 to 65%. The left ventricle has normal function. The left ventricle has no regional wall motion abnormalities. There is mild concentric left ventricular hypertrophy. Left ventricular diastolic parameters were normal.  2. Right ventricular systolic function is normal. The right ventricular size is normal. Tricuspid regurgitation signal is inadequate for assessing PA pressure.  3. The mitral valve is degenerative. Mild mitral valve regurgitation.  4. The aortic valve has been repaired/replaced. Aortic valve regurgitation is not visualized. There is a 23 mm Edwards Inspiris Resilia pericardial valve present in the aortic position. Aortic valve mean gradient measures 12.0 mmHg.  5. The inferior vena cava is normal in size with <50% respiratory variability, suggesting right atrial pressure of 8 mmHg. Comparison(s): Prior images reviewed side by side. LVEF normal range at 60-65%. Pericardial AVR with grossly normal function (dimentionless index 0.61), no aortic regurgitation. mean AV gradient has increased from 6-12 mmHg however since prior study. FINDINGS  Left Ventricle: Left ventricular ejection fraction, by estimation, is 60 to 65%. The left ventricle has normal function. The left ventricle has no regional wall motion abnormalities. The  left ventricular internal cavity size was normal in size. There is  mild concentric left ventricular hypertrophy. Left ventricular diastolic parameters were normal. Right Ventricle: The right ventricular size is normal. No increase in right ventricular wall thickness. Right ventricular systolic function is normal. Tricuspid regurgitation signal is inadequate for assessing PA pressure. Left Atrium: Left atrial size was normal in size. Right Atrium: Right atrial size was normal in size. Pericardium: There is no evidence of pericardial effusion. Mitral Valve: The mitral valve is degenerative in appearance. Mild mitral valve regurgitation. Tricuspid Valve: The tricuspid valve is grossly normal. Tricuspid valve regurgitation is trivial. Aortic Valve: The aortic valve has been repaired/replaced. Aortic valve regurgitation is not visualized. Aortic valve mean gradient measures 12.0 mmHg. Aortic valve peak gradient measures 25.0 mmHg. Aortic valve area, by VTI measures 1.72 cm. There is a  23 mm Edwards Inspiris Resilia pericardial valve present in the aortic position. Pulmonic Valve: The pulmonic valve was grossly normal. Pulmonic valve regurgitation is trivial. Aorta: The aortic root is normal in size and structure. Venous: The inferior vena cava is normal in size with less than 50% respiratory variability, suggesting right atrial pressure of 8 mmHg. IAS/Shunts: No atrial level shunt detected by color flow Doppler.  LEFT VENTRICLE PLAX 2D LVIDd:         4.80 cm   Diastology LVIDs:         3.00 cm   LV e' medial:    7.72 cm/s LV PW:         1.20 cm   LV E/e' medial:  14.6 LV IVS:        1.20 cm   LV e' lateral:   12.00 cm/s LVOT diam:     1.90 cm   LV E/e' lateral: 9.4 LV SV:         85 LV SV Index:   46 LVOT Area:     2.84 cm  RIGHT VENTRICLE RV Basal diam:  3.40 cm RV S prime:     10.60 cm/s TAPSE (M-mode): 1.6 cm LEFT ATRIUM             Index        RIGHT ATRIUM  Index LA diam:        3.80 cm 2.04 cm/m   RA  Area:     15.70 cm LA Vol (A2C):   33.0 ml 17.68 ml/m  RA Volume:   36.60 ml  19.61 ml/m LA Vol (A4C):   37.0 ml 19.82 ml/m LA Biplane Vol: 35.6 ml 19.07 ml/m  AORTIC VALVE AV Area (Vmax):    1.76 cm AV Area (Vmean):   1.78 cm AV Area (VTI):     1.72 cm AV Vmax:           250.00 cm/s AV Vmean:          164.500 cm/s AV VTI:            0.494 m AV Peak Grad:      25.0 mmHg AV Mean Grad:      12.0 mmHg LVOT Vmax:         155.00 cm/s LVOT Vmean:        103.000 cm/s LVOT VTI:          0.300 m LVOT/AV VTI ratio: 0.61 MITRAL VALVE MV Area (PHT): 3.39 cm     SHUNTS MV Decel Time: 224 msec     Systemic VTI:  0.30 m MV E velocity: 113.00 cm/s  Systemic Diam: 1.90 cm MV A velocity: 85.40 cm/s MV E/A ratio:  1.32 Nona Dell MD Electronically signed by Nona Dell MD Signature Date/Time: 12/29/2022/1:31:31 PM    Final        Assessment / Plan:    67 y/o male admitted with the following:  Upper GI bleed secondary to gastric ulcer Symptomatic anemia Elevated troponin from demand ischemia, history of CAD A fib on anticoagulation  EGD yesterday showed 2 gastric ulcers in the proximal stomach, one with adherent pigmented material at the base, suspect culprit lesion and was clipped. Gastritis noted otherwise, biopsies taken to rule out H pylori.   He has done well post procedure. Hgb slightly drifted this AM, suspect could be due to re-equilibration as his BUN continues to downtrend and he has no recurrent symptoms of bleeding. Okay to advance to soft diet today. Continue protonix 40mg  twice daily for now, would continue this dose for one month and then once daily thereafter. Await biopsies to rule out H pylori. Avoid NSAIDs. I would monitor him today and if he continues to do well, consider discharge tomorrow.  Following his hospitalization we can coordinate outpatient follow up. Given his fair colonoscopy prep would recommend outpatient colonoscopy for screening purposes and can repeat EGD at that  time to confirm mucosal healing of the ulcers.   I would resume Eliquis in another 3 days, monitor for any recurrent bleeding in the interim.   PLAN: - soft diet today - continue protonix 40mg  BID - upon discharge can continue at this dosing for one month and then reduce to once daily dosing - await biopsy results - rule out H pylori - avoid NSAIDs - outpatient EGD and colonoscopy after he has recovered from hospitalization, can do in a few months - resume Eliquis in 3 days or so - we can coordinate outpatient follow up, and CBC post discharge, would check next week - hopefully may be able to go home tomorrow if he does well today  Call with questions, we will sign off for now.  Harlin Rain, MD [facility]

## 2022-12-31 NOTE — Progress Notes (Addendum)
Patient Name: Antonio Cervantes Date of Encounter: 12/31/2022 [facility] Cardiologist: None   Interval Summary  .    No complaints today, denies any chest pain or shortness of breath.  EGD found gastric ulcers.   Vital Signs .    Vitals:   12/30/22 2301 12/31/22 0353 12/31/22 0400 12/31/22 0430  BP: 108/63 128/73 125/67 (!) 116/51  Pulse: 76 77 79 71  Resp: (!) 21 15 17 16   Temp: 98.5 F (36.9 C) 98.2 F (36.8 C)    TempSrc: Oral Oral    SpO2: 99% 96% 97% 95%  Weight:  73.9 kg    Height:        Intake/Output Summary (Last 24 hours) at 12/31/2022 0803 Last data filed at 12/31/2022 0756 Gross per 24 hour  Intake 635.16 ml  Output 950 ml  Net -314.84 ml      12/31/2022    3:53 AM 12/28/2022   11:13 AM 12/17/2022    2:04 PM  Last 3 Weights  Weight (lbs) 162 lb 14.7 oz 170 lb 170 lb  Weight (kg) 73.9 kg 77.111 kg 77.111 kg      Telemetry/ECG    Normal sinus rhythm heart rate 70s to 80s- Personally Reviewed  CV Studies     Echocardiogram from 12/29/2022:   1. Left ventricular ejection fraction, by estimation, is 60 to 65%. The  left ventricle has normal function. The left ventricle has no regional  wall motion abnormalities. There is mild concentric left ventricular  hypertrophy. Left ventricular diastolic  parameters were normal.   2. Right ventricular systolic function is normal. The right ventricular  size is normal. Tricuspid regurgitation signal is inadequate for assessing  PA pressure.   3. The mitral valve is degenerative. Mild mitral valve regurgitation.   4. The aortic valve has been repaired/replaced. Aortic valve  regurgitation is not visualized. There is a 23 mm Edwards Inspiris Resilia  pericardial valve present in the aortic position. Aortic valve mean  gradient measures 12.0 mmHg.   5. The inferior vena cava is normal in size with <50% respiratory  variability, suggesting right atrial pressure of 8 mmHg.   Comparison(s): Prior images  reviewed side by side. LVEF normal range at  60-65%. Pericardial AVR with grossly normal function (dimentionless index  0.61), no aortic regurgitation. mean AV gradient has increased from 6-12  mmHg however since prior study.    Echo from 07/12/2022:  1. Left ventricular ejection fraction, by estimation, is 55 to 60%. The  left ventricle has normal function. The left ventricle has no regional  wall motion abnormalities. Left ventricular diastolic parameters are  indeterminate.   2. Right ventricular systolic function is mildly reduced. The right  ventricular size is normal. Tricuspid regurgitation signal is inadequate  for assessing PA pressure.   3. The mitral valve is normal in structure. Mild mitral valve  regurgitation. No evidence of mitral stenosis.   4. The aortic valve has been repaired/replaced. Aortic valve  regurgitation is not visualized. There is a 23 mm Edwards INSPIRIS RESILIA  pericardial valve valve present in the aortic position. Procedure Date:  07/29/21. Echo findings are consistent with  normal structure and function of the aortic valve prosthesis. Aortic valve  area, by VTI measures 2.02 cm. Aortic valve mean gradient measures 6.0  mmHg. Aortic valve Vmax measures 1.76 m/s.   5. The inferior vena cava is normal in size with greater than 50%  respiratory variability, suggesting right atrial pressure of 3  mmHg.      Right and left heart cath from 05/17/2021:     Prox LAD to Mid LAD lesion is 75% stenosed.   Dist LAD lesion is 80% stenosed.   1st Diag lesion is 80% stenosed.   Ost RCA to Mid RCA lesion is 99% stenosed.   2nd Mrg lesion is 99% stenosed.   Dist Cx lesion is 90% stenosed.   Prox Cx to Mid Cx lesion is 75% stenosed.   LV end diastolic pressure is normal.   There is no aortic valve stenosis.   Severe, multivessel coronary artery disease.  Normal LVEDP.   Given diffuse, distal vessel disease, there are no good options for revascularization.  Plan  for medical therapy to try to improve his LV function.  Probable heart failure consultation as well.  Physical Exam .   GEN: No acute distress.   Neck: No JVD Cardiac: RRR. +murmur  Respiratory: Clear to auscultation bilaterally. GI: Soft, nontender, non-distended  MS: No edema  Patient Profile    Antonio Cervantes is a 67 y.o. 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  Assessment & Plan .    Gastric ulcers with acute blood loss anemia  Patient requiring 3 units of PRBCs. EGD revealed gastritic ulcers with placement of one clip. Biopsies were taken.  Colonoscopy negative. Has been started on Protonix 40 mg twice daily.  Eliquis to be started when safe from a GI standpoint.  NSTEMI type II Multivessel CAD status post CABG x 4 07/2021 In the setting of acute blood loss anemia and in the absence of any significant cardiac symptoms such as chest pain or shortness of breath (per his report to me today, previous notes indicated otherwise) and normal echocardiogram there is a low suspicion for ACS at this time.  However, he did have ST depressions inferiorly.  In the setting of active GI bleed further ischemic evaluation would not be appropriate.  Can consider outpatient Myoview if there is still concern.  Continue to hold his aspirin and Eliquis.  May resume once GI clears  Ischemic cardiomyopathy Previous history of HFrEF EF to be 20 to 25% secondary to NSTEMI and at the time had multivessel disease.  Has had normalization of EF since June 2023 on multiple repeat echocardiograms including this admissions.  EF 60 to 65% with no regional wall motion abnormalities.  Normal RV function.  Overall does very well without any complaints of shortness of breath or peripheral  edema.  GDMT has been limited by CKD. PTA Farxiga, Toprol-XL 25 mg.  Both of these medications have been held. Continue when felt to be stable from a GI standpoint.  Renal function shows a GFR of 29. Okay to continue [place] as there are no contraindications to decreased GFR and rather shows decreased efficacy at lower values, continue to monitor.  Atrial fibrillation Has been maintaining normal sinus rhythm.  Per chart review last EKG showing atrial fibrillation was in January 2023 at the time of his NSTEMI. CHADVASC is 5. If recurrent issues with bleeding, may need to consider discontinuation since low occurrence of arrhythmia. Continue for now.   Hyperlipidemia Continue atorvastatin 80 mg.  LDL 7 months ago 53.  Aortic valve replacement 07/2021 Mean gradient 12.  Stable on echo.  CKD, GI bleeding, hypothyroidism Per primary  team.  Will schedule follow up.    For questions or updates, please contact [facility] Please consult [email or web address] for contact info under        Signed, Abagail Kitchens, PA-C   Personally seen and examined. Agree with above.  Maintaining sinus rhythm with regard to his atrial fibrillation with CHA2DS2-VASc of 5.  Anticoagulation currently on hold.  Status post 3 units of packed red blood cells secondary to gastric ulcers demonstrated on EGD.  On Protonix.  Resume Eliquis when felt to be reasonable from GI standpoint (Dr. Adela Lank stated 3 days or so).  Of course bleeding recur.  Also has an aortic valve replacement from 2023.  Usually we recommend aspirin 81 mg with this as well.  We will schedule follow-up.  Please let us know if we can be of further assistance.  We will sign off.  Donato Schultz, MD

## 2023-01-01 ENCOUNTER — Other Ambulatory Visit (HOSPITAL_COMMUNITY): Payer: Self-pay

## 2023-01-01 DIAGNOSIS — K922 Gastrointestinal hemorrhage, unspecified: Secondary | ICD-10-CM | POA: Diagnosis not present

## 2023-01-01 DIAGNOSIS — Z951 Presence of aortocoronary bypass graft: Secondary | ICD-10-CM | POA: Diagnosis not present

## 2023-01-01 DIAGNOSIS — E1169 Type 2 diabetes mellitus with other specified complication: Secondary | ICD-10-CM | POA: Diagnosis not present

## 2023-01-01 LAB — CBC
HCT: 28.9 % — ABNORMAL LOW (ref 39.0–52.0)
Hemoglobin: 9.8 g/dL — ABNORMAL LOW (ref 13.0–17.0)
MCH: 31.2 pg (ref 26.0–34.0)
MCHC: 33.9 g/dL (ref 30.0–36.0)
MCV: 92 fL (ref 80.0–100.0)
Platelets: 232 10*3/uL (ref 150–400)
RBC: 3.14 MIL/uL — ABNORMAL LOW (ref 4.22–5.81)
RDW: 16.3 % — ABNORMAL HIGH (ref 11.5–15.5)
WBC: 10.9 10*3/uL — ABNORMAL HIGH (ref 4.0–10.5)
nRBC: 0 % (ref 0.0–0.2)

## 2023-01-01 LAB — BPAM RBC
Blood Product Expiration Date: 202409042359
Blood Product Expiration Date: 202409072359
Blood Product Expiration Date: 202409232359
Blood Product Expiration Date: 202409242359
ISSUE DATE / TIME: 202409011323
ISSUE DATE / TIME: 202409011646
ISSUE DATE / TIME: 202409020853
ISSUE DATE / TIME: 202409040928
Unit Type and Rh: 600
Unit Type and Rh: 6200
Unit Type and Rh: 6200
Unit Type and Rh: 6200

## 2023-01-01 LAB — TYPE AND SCREEN
ABO/RH(D): A POS
Antibody Screen: NEGATIVE
Unit division: 0
Unit division: 0
Unit division: 0
Unit division: 0

## 2023-01-01 LAB — GLUCOSE, CAPILLARY
Glucose-Capillary: 116 mg/dL — ABNORMAL HIGH (ref 70–99)
Glucose-Capillary: 126 mg/dL — ABNORMAL HIGH (ref 70–99)

## 2023-01-01 LAB — SURGICAL PATHOLOGY

## 2023-01-01 MED ORDER — ASPIRIN 81 MG PO TBEC: 81.0000 mg | DELAYED_RELEASE_TABLET | Freq: Every day | ORAL | Status: AC

## 2023-01-01 MED ORDER — APIXABAN 5 MG PO TABS: 5.0000 mg | ORAL_TABLET | Freq: Two times a day (BID) | ORAL | Status: DC

## 2023-01-01 MED ORDER — PANTOPRAZOLE SODIUM 40 MG PO TBEC
DELAYED_RELEASE_TABLET | ORAL | 1 refills | Status: DC
  Filled 2023-01-01: qty 90, fill #0

## 2023-01-01 NOTE — Care Management Important Message (Signed)
Important Message  Patient Details  Name: WINTON PASION MRN: [id number] Date of Birth: 30-Jul-1955   Medicare Important Message Given:  Yes Patient left prior to IM delivery will mail a copy of the IM to the patient home address.     Danett Palazzo 01/01/2023, 2:32 PM

## 2023-01-01 NOTE — Discharge Summary (Signed)
PATIENT DETAILS Name: Antonio Cervantes Age: 67 y.o. Sex: male Date of Birth: 08/04/1955 MRN: [id number]. Admitting Physician: Cathren Harsh, MD WUJ:[id number], Kathrene Bongo, FNP  Admit Date: 12/28/2022 Discharge date: 01/01/2023  Recommendations for Outpatient Follow-up:  Follow up with PCP in 1-2 weeks Please obtain CMP/CBC in one week  Admitted From:  Home  Disposition: Home   Discharge Condition: good  CODE STATUS:   Code Status: Full Code   Diet recommendation:  Diet Order             Diet - low sodium heart healthy           Diet Carb Modified           DIET SOFT Room service appropriate? Yes; Fluid consistency: Thin  Diet effective now                    Brief Summary: Patient is a 67 y.o.  male with history of CAD/aortic valve replacement-April 2023, A-fib on Eliquis, HFpEF, HTN, DM-2 who presented to the ED with exertional dyspnea/dizziness/lightheadedness-recent fall-he was found to have a hemoglobin of 6.4, he was found to have melena on digital rectal exam by EDP-he was admitted to [facility] on PPI-given 2 units of PRBC.  See below for further details.   Significant events: 9/1>> admit to [facility]-symptomatic anemia-melanotic stools on digital rectal exam by EDP   Significant studies: 9/1>> CXR: No active disease 9/1>> CT head: No acute abnormalities 9/1>> CT chest/abdomen/pelvis: No evidence of acute injury in the chest/abdomen/pelvis. 9/2>> echo: EF 60-65%   Significant microbiology data: None   Procedures: 9/3>> EGD: Nonbleeding gastric ulcer-largest with pigmented material at base-clip was placed.  9/3>> colonoscopy: No mass/polyps noted.   Consults: GI Cardiology  Brief Hospital Course: Upper GI bleeding with acute blood loss anemia In the setting of Eliquis/aspirin/NSAID use Underwent EGD which showed gastric ulcer with recent evidence of bleeding-please see EGD report Required a total of 3 units of PRBC No further bleeding-hemoglobin  stable GI recommending twice daily Protonix x 4 weeks, then once daily. Avoid NSAIDs in the future (counseled patient at length this morning). Follow CBC in the patient setting.   Near syncope/generalized weakness/mechanical fall Secondary to symptomatic anemia/blood loss/probable orthostatic hypotension See above regarding plans to treat underlying GI bleed CT imaging negative for any acute injuries. Mobilize with PT/OT when able   PAF Anticoagulation held-in the setting of active GI bleed Discussed with Dr. Adela Lank 9/4-okay to resume Eliquis on 9/8 Restarted on metoprolol   Type II non-STEMI Secondary to anemia/GI bleeding Echo with stable EF Appreciate cardiology input.   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   Aortic valve replacement-April 2023 Echo with stable prosthetic valve.   DM-2 (A1c 8.0 on 8/9) CBG stable with SSI Resume oral hypoglycemics on discharge.  CKD stage IV Creatinine close to baseline Significant elevation in BUN-but this is likely due to upper GI bleed CKD stage IIIb ruled out   Hypothyroidism Synthroid TSH minimally elevated-stable for close outpatient follow-up with PCP and repeat thyroid function test in 6-8 weeks.   BMI: Estimated body mass index is 26.3 kg/m as calculated from the following:   Height as of this encounter: 5\' 6"  (1.676 m).   Weight as of this encounter: 73.9 kg.    Discharge Diagnoses:  Principal Problem:   Upper GI bleed Active Problems:  Unspecified atrial fibrillation (HCC)   DM (diabetes mellitus), type 2 (HCC)   Hyperlipidemia   HFrEF (heart failure with reduced ejection fraction) (HCC)   S/P CABG x 4   Near syncope   Acute post-hemorrhagic anemia   Symptomatic anemia   Acute gastric ulcer with hemorrhage   Melena   Anticoagulated   Discharge  Instructions:  Activity:  As tolerated  Discharge Instructions     Diet - low sodium heart healthy   Complete by: As directed    Diet Carb Modified   Complete by: As directed    Discharge instructions   Complete by: As directed    Follow with Primary MD  Ellis Parents, FNP in 1-2 weeks  Please follow-up with gastroenterology-the office will give you a call, if you do not hear from them-please give them a call.  Resume Eliquis and aspirin on 9/8.  Avoid NSAIDs-medications like ibuprofen, Aleve, Advil, Motrin.  Avoid indomethacin as well-if you have a gout flare-please talk to your primary care practitioner about taking colchicine or a short course of steroids. Please get a complete blood count and chemistry panel checked by your Primary MD at your next visit, and again as instructed by your Primary MD.  Get Medicines reviewed and adjusted: Please take all your medications with you for your next visit with your Primary MD  Laboratory/radiological data: Please request your Primary MD to go over all hospital tests and procedure/radiological results at the follow up, please ask your Primary MD to get all Hospital records sent to his/her office.  In some cases, they will be blood work, cultures and biopsy results pending at the time of your discharge. Please request that your primary care M.D. follows up on these results.  Also Note the following: If you experience worsening of your admission symptoms, develop shortness of breath, life threatening emergency, suicidal or homicidal thoughts you must seek medical attention immediately by calling 911 or calling your MD immediately  if symptoms less severe.  You must read complete instructions/literature along with all the possible adverse reactions/side effects for all the Medicines you take and that have been prescribed to you. Take any new Medicines after you have completely understood and accpet all the possible adverse reactions/side  effects.   Do not drive when taking Pain medications or sleeping medications (Benzodaizepines)  Do not take more than prescribed Pain, Sleep and Anxiety Medications. It is not advisable to combine anxiety,sleep and pain medications without talking with your primary care practitioner  Special Instructions: If you have smoked or chewed Tobacco  in the last 2 yrs please stop smoking, stop any regular Alcohol  and or any Recreational drug use.  Wear Seat belts while driving.  Please note: You were cared for by a hospitalist during your hospital stay. Once you are discharged, your primary care physician will handle any further medical issues. Please note that NO REFILLS for any discharge medications will be authorized once you are discharged, as it is imperative that you return to your primary care physician (or establish a relationship with a primary care physician if you do not have one) for your post hospital discharge needs so that they can reassess your need for medications and monitor your lab values.   Increase activity slowly   Complete by: As directed       Allergies as of 01/01/2023   No Known Allergies      Medication List     TAKE these medications  apixaban 5 MG Tabs tablet Commonly known as: Eliquis Take 1 tablet (5 mg total) by mouth 2 (two) times daily. Start taking on: January 04, 2023 What changed: These instructions start on January 04, 2023. If you are unsure what to do until then, ask your doctor or other care provider.   aspirin EC 81 MG tablet Take 1 tablet (81 mg total) by mouth daily. Swallow whole. Start taking on: January 04, 2023 What changed: These instructions start on January 04, 2023. If you are unsure what to do until then, ask your doctor or other care provider.   atorvastatin 80 MG tablet Commonly known as: LIPITOR Take 1 tablet (80 mg total) by mouth daily.   dapagliflozin propanediol 10 MG Tabs tablet Commonly known as: Farxiga Take 1  tablet (10 mg total) by mouth daily before breakfast.   furosemide 20 MG tablet Commonly known as: LASIX Take 20 mg by mouth daily.   glipiZIDE 5 MG tablet Commonly known as: GLUCOTROL Take 5 mg by mouth daily.   indomethacin 25 MG capsule Commonly known as: INDOCIN Take 25 mg by mouth daily as needed for mild pain or moderate pain.   levothyroxine 112 MCG tablet Commonly known as: SYNTHROID Take 112 mcg by mouth daily.   metoprolol succinate 25 MG 24 hr tablet Commonly known as: TOPROL-XL Take 1 tablet (25 mg total) by mouth daily.   pantoprazole 40 MG tablet Commonly known as: PROTONIX Take twice daily for 4 weeks, and then once daily thereafter.        Follow-up Information     Louanne Skye Devoria Albe., NP Follow up.   Specialty: Cardiology Why: Wednesday Jan 14, 2023 Arrive by 10:40 AMAppt at 10:55 AM (25 min) Contact information: [address] [place] [address] [phone]         Ellis Parents, FNP. Schedule an appointment as soon as possible for a visit in 1 week(s).   Specialty: Nurse Practitioner Contact information: [address] [place] [address] [phone]         [facility] Follow up.   Specialty: Gastroenterology Why: Hospital follow up, Office will call with date/time, If you dont hear from them,please give them a call Contact information: [address] [place] [address] [phone]               No Known Allergies   Other Procedures/Studies: ECHOCARDIOGRAM COMPLETE  Result Date: 12/29/2022    ECHOCARDIOGRAM REPORT   Patient Name:   CLARON CAPPELL Date of Exam: 12/29/2022 Medical Rec #:  [id number]         Height:       66.0 in Accession #:    [id number]        Weight:       170.0 lb Date of Birth:  [birth year]        BSA:          1.866 m Patient Age:    66 years          BP:           110/50 mmHg Patient Gender: M                 HR:           72  bpm. Exam Location:  Inpatient Procedure: 2D Echo, Color Doppler and Cardiac Doppler Indications:    Elevated Troponin  History:        Patient has  prior history of Echocardiogram examinations, most                 recent 07/12/2022. CHF, CAD, Prior CABG, Aortic Valve Disease and                 #23 Edwards Inspira Resilia AVR, Arrythmias:Atrial Fibrillation;                 Risk Factors:Diabetes.                 Aortic Valve: 23 mm Edwards Inspiris Resilia pericardial valve                 is present in the aortic position.  Sonographer:    Milbert Coulter Referring Phys: [id number] RIPUDEEP K RAI  Sonographer Comments: Image acquisition challenging due to patient body habitus. IMPRESSIONS  1. Left ventricular ejection fraction, by estimation, is 60 to 65%. The left ventricle has normal function. The left ventricle has no regional wall motion abnormalities. There is mild concentric left ventricular hypertrophy. Left ventricular diastolic parameters were normal.  2. Right ventricular systolic function is normal. The right ventricular size is normal. Tricuspid regurgitation signal is inadequate for assessing PA pressure.  3. The mitral valve is degenerative. Mild mitral valve regurgitation.  4. The aortic valve has been repaired/replaced. Aortic valve regurgitation is not visualized. There is a 23 mm Edwards Inspiris Resilia pericardial valve present in the aortic position. Aortic valve mean gradient measures 12.0 mmHg.  5. The inferior vena cava is normal in size with <50% respiratory variability, suggesting right atrial pressure of 8 mmHg. Comparison(s): Prior images reviewed side by side. LVEF normal range at 60-65%. Pericardial AVR with grossly normal function (dimentionless index 0.61), no aortic regurgitation. mean AV gradient has increased from 6-12 mmHg however since prior study. FINDINGS  Left Ventricle: Left ventricular ejection fraction, by estimation, is 60 to 65%. The left ventricle has normal function. The left  ventricle has no regional wall motion abnormalities. The left ventricular internal cavity size was normal in size. There is  mild concentric left ventricular hypertrophy. Left ventricular diastolic parameters were normal. Right Ventricle: The right ventricular size is normal. No increase in right ventricular wall thickness. Right ventricular systolic function is normal. Tricuspid regurgitation signal is inadequate for assessing PA pressure. Left Atrium: Left atrial size was normal in size. Right Atrium: Right atrial size was normal in size. Pericardium: There is no evidence of pericardial effusion. Mitral Valve: The mitral valve is degenerative in appearance. Mild mitral valve regurgitation. Tricuspid Valve: The tricuspid valve is grossly normal. Tricuspid valve regurgitation is trivial. Aortic Valve: The aortic valve has been repaired/replaced. Aortic valve regurgitation is not visualized. Aortic valve mean gradient measures 12.0 mmHg. Aortic valve peak gradient measures 25.0 mmHg. Aortic valve area, by VTI measures 1.72 cm. There is a  23 mm Edwards Inspiris Resilia pericardial valve present in the aortic position. Pulmonic Valve: The pulmonic valve was grossly normal. Pulmonic valve regurgitation is trivial. Aorta: The aortic root is normal in size and structure. Venous: The inferior vena cava is normal in size with less than 50% respiratory variability, suggesting right atrial pressure of 8 mmHg. IAS/Shunts: No atrial level shunt detected by color flow Doppler.  LEFT VENTRICLE PLAX 2D LVIDd:         4.80 cm   Diastology LVIDs:         3.00 cm   LV e' medial:    7.72 cm/s LV PW:  1.20 cm   LV E/e' medial:  14.6 LV IVS:        1.20 cm   LV e' lateral:   12.00 cm/s LVOT diam:     1.90 cm   LV E/e' lateral: 9.4 LV SV:         85 LV SV Index:   46 LVOT Area:     2.84 cm  RIGHT VENTRICLE RV Basal diam:  3.40 cm RV S prime:     10.60 cm/s TAPSE (M-mode): 1.6 cm LEFT ATRIUM             Index        RIGHT ATRIUM            Index LA diam:        3.80 cm 2.04 cm/m   RA Area:     15.70 cm LA Vol (A2C):   33.0 ml 17.68 ml/m  RA Volume:   36.60 ml  19.61 ml/m LA Vol (A4C):   37.0 ml 19.82 ml/m LA Biplane Vol: 35.6 ml 19.07 ml/m  AORTIC VALVE AV Area (Vmax):    1.76 cm AV Area (Vmean):   1.78 cm AV Area (VTI):     1.72 cm AV Vmax:           250.00 cm/s AV Vmean:          164.500 cm/s AV VTI:            0.494 m AV Peak Grad:      25.0 mmHg AV Mean Grad:      12.0 mmHg LVOT Vmax:         155.00 cm/s LVOT Vmean:        103.000 cm/s LVOT VTI:          0.300 m LVOT/AV VTI ratio: 0.61 MITRAL VALVE MV Area (PHT): 3.39 cm     SHUNTS MV Decel Time: 224 msec     Systemic VTI:  0.30 m MV E velocity: 113.00 cm/s  Systemic Diam: 1.90 cm MV A velocity: 85.40 cm/s MV E/A ratio:  1.32 Nona Dell MD Electronically signed by Nona Dell MD Signature Date/Time: 12/29/2022/1:31:31 PM    Final    CT Head Wo Contrast  Result Date: 12/28/2022 CLINICAL DATA:  Ataxia, head trauma with pain. EXAM: CT HEAD WITHOUT CONTRAST TECHNIQUE: Contiguous axial images were obtained from the base of the skull through the vertex without intravenous contrast. RADIATION DOSE REDUCTION: This exam was performed according to the departmental dose-optimization program which includes automated exposure control, adjustment of the mA and/or kV according to patient size and/or use of iterative reconstruction technique. COMPARISON:  None Available. 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 There is mild cerebral volume loss with associated ex vacuo dilatation. No mass effect or midline shift. The basilar cisterns are patent. Periventricular white matter hypoattenuation likely represents chronic small vessel ischemic disease. Vascular: There are vascular calcifications in the carotid siphons. Skull: Normal. Negative for fracture or focal lesion.  Sinuses/Orbits: There is mild left sphenoid sinus disease. Other: None. 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 If clinically warranted, MRI brain could be performed for further characterization. Electronically Signed   By: Romona Curls M.D.   On: 12/28/2022 14:10   CT CHEST ABDOMEN PELVIS W CONTRAST  Result Date: 12/28/2022 CLINICAL DATA:  Poly trauma, blunt EXAM: CT  CHEST, ABDOMEN, AND PELVIS WITH CONTRAST TECHNIQUE: Multidetector CT imaging of the chest, abdomen and pelvis was performed following the standard protocol during bolus administration of intravenous contrast. RADIATION DOSE REDUCTION: This exam was performed according to the departmental dose-optimization program which includes automated exposure control, adjustment of the mA and/or kV according to patient size and/or use of iterative reconstruction technique. CONTRAST:  75mL OMNIPAQUE IOHEXOL 350 MG/ML SOLN COMPARISON:  Recent prior esophagram 12/23/2022; prior CT scan of the abdomen and pelvis 07/07/2016; prior CT scan of the chest 09/16/2017 FINDINGS: CT CHEST FINDINGS Cardiovascular: Conventional 3 vessel arch anatomy. No evidence of aortic aneurysm or dissection. 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 The heart is normal in size. Main pulmonary artery is unremarkable. No pericardial effusion. Mediastinum/Nodes: Unremarkable CT appearance of the thyroid gland. No suspicious mediastinal or hilar adenopathy. No soft tissue mediastinal mass. The thoracic esophagus is unremarkable. Lungs/Pleura: Lungs are clear. No pleural effusion or pneumothorax. Musculoskeletal: No acute fracture or aggressive appearing lytic or blastic osseous lesion. Healed median sternotomy. CT ABDOMEN PELVIS FINDINGS Overall imaging quality of the abdomen and pelvis is moderately limited due to extensive streak  artifact from barium throughout the colon which is likely the residual of the recent prior esophagram. Hepatobiliary: Normal hepatic contour and morphology. Gallbladder is unremarkable. No intra or extrahepatic biliary ductal dilatation. Pancreas: Unremarkable. No pancreatic ductal dilatation or surrounding inflammatory changes. Spleen: Normal in size without focal abnormality. Adrenals/Urinary Tract: The adrenal glands are normal. No hydronephrosis, nephrolithiasis or enhancing renal mass. Ureters and bladder are grossly normal. Stomach/Bowel: High attenuation material present throughout the entire colon from the cecum to the anus. No evidence of bowel obstruction. No focal bowel wall thickening. Vascular/Lymphatic: Atherosclerotic calcifications throughout the abdominal aorta. No evidence of aneurysm. No suspicious lymphadenopathy. Reproductive: The prostate gland is unremarkable. Other: No evidence of ascites. No significant abdominal wall hernia. Musculoskeletal: No acute fracture or malalignment. Mild multilevel degenerative changes. IMPRESSION: 1. No evidence of acute injury to the chest, abdomen or pelvis. 2. Aortic and coronary artery atherosclerotic vascular calcifications. 3. Surgical changes of prior median sternotomy with aortic valve replacement and multivessel CABG. 4. Evaluation of the intra-abdominal and pelvic structures is somewhat limited due to extensive streak artifact from high density barium throughout the colon. 5. Mild multilevel degenerative changes throughout the spine. Electronically Signed   By: Malachy Moan M.D.   On: 12/28/2022 13:28   DG Chest Portable 1 View  Result Date: 12/28/2022 CLINICAL DATA:  Dyspnea. EXAM: PORTABLE CHEST 1 VIEW COMPARISON:  09/15/2022 FINDINGS: The lungs are clear without focal pneumonia, edema, pneumothorax or pleural effusion. The cardiopericardial silhouette is within normal limits for size. No acute bony abnormality. Telemetry leads overlie the  chest. IMPRESSION: No active disease. Electronically Signed   By: Kennith Center M.D.   On: 12/28/2022 12:28   DG ESOPHAGUS W DOUBLE CM (HD)  Result Date: 12/23/2022 CLINICAL DATA:  Globus sensation and dysphagia. EXAM: ESOPHOGRAM / BARIUM SWALLOW / BARIUM TABLET STUDY TECHNIQUE: Combined double contrast and single contrast examination performed using effervescent crystals, thick barium liquid, and thin barium liquid. The patient was observed with fluoroscopy swallowing a 13 mm barium sulphate tablet. FLUOROSCOPY: Radiation Exposure Index (as provided by the fluoroscopic device): 13.8 mGy Kerma COMPARISON:  Chest CT of 09/16/2017 from [facility] FINDINGS: Hypopharyngeal portion of the exam is unremarkable. Double contrast evaluation of the esophagus demonstrates no mucosal abnormality. Evaluation of esophageal peristalsis demonstrates proximal escape  waves and contrast stasis in the upper esophagus on each of 2 swallows. Full column evaluation of the esophagus demonstrates no persistent narrowing or stricture. A 13 mm barium tablet passes promptly. IMPRESSION: Mild esophageal dysmotility, likely presbyesophagus. No other explanation for patient's symptoms. Electronically Signed   By: Jeronimo Greaves M.D.   On: 12/23/2022 12:00     TODAY-DAY OF DISCHARGE:  Subjective:   Antonio Cervantes today has no headache,no chest abdominal pain,no new weakness tingling or numbness, feels much better wants to go home today.   Objective:   Blood pressure (!) 152/79, pulse 68, temperature 98.2 F (36.8 C), temperature source Oral, resp. rate 18, height 5\' 6"  (1.676 m), weight 73.9 kg, SpO2 96%.  Intake/Output Summary (Last 24 hours) at 01/01/2023 0926 Last data filed at 01/01/2023 0641 Gross per 24 hour  Intake 608 ml  Output 1725 ml  Net -1117 ml   Filed Weights   12/28/22 1113 12/31/22 0353  Weight: 77.1 kg 73.9 kg    Exam: Awake Alert, Oriented *3, No new F.N deficits, Normal  affect .AT,PERRAL Supple Neck,No JVD, No cervical lymphadenopathy appriciated.  Symmetrical Chest wall movement, Good air movement bilaterally, CTAB RRR,No Gallops,Rubs or new Murmurs, No Parasternal Heave +ve B.Sounds, Abd Soft, Non tender, No organomegaly appriciated, No rebound -guarding or rigidity. No Cyanosis, Clubbing or edema, No new Rash or bruise   PERTINENT RADIOLOGIC STUDIES: No results found.   PERTINENT LAB RESULTS: CBC: Recent Labs    12/31/22 0638 12/31/22 1501 01/01/23 0328  WBC 11.6*  --  10.9*  HGB 7.6* 9.9* 9.8*  HCT 22.7* 28.9* 28.9*  PLT 215  --  232   CMET CMP     Component Value Date/Time   NA 136 12/31/2022 0638   NA 137 05/08/2022 1448   K 3.8 12/31/2022 0638   CL 109 12/31/2022 0638   CO2 20 (L) 12/31/2022 0638   GLUCOSE 108 (H) 12/31/2022 0638   BUN 46 (H) 12/31/2022 0638   BUN 29 (H) 05/08/2022 1448   CREATININE 2.16 (H) 12/31/2022 0638   CALCIUM 8.7 (L) 12/31/2022 0638   PROT 5.8 (L) 12/29/2022 0259   ALBUMIN 3.0 (L) 12/29/2022 0259   AST 18 12/29/2022 0259   ALT 13 12/29/2022 0259   ALKPHOS 52 12/29/2022 0259   BILITOT 0.5 12/29/2022 0259   GFR 21.65 (L) 12/23/2022 1101   EGFR 29.0 12/05/2022 1218   EGFR 56 (L) 05/08/2022 1448   GFRNONAA 33 (L) 12/31/2022 0638    GFR Estimated Creatinine Clearance: 29.9 mL/min (A) (by C-G formula based on SCr of 2.16 mg/dL (H)). No results for input(s): "LIPASE", "AMYLASE" in the last 72 hours. No results for input(s): "CKTOTAL", "CKMB", "CKMBINDEX", "TROPONINI" in the last 72 hours. Invalid input(s): "POCBNP" No results for input(s): "DDIMER" in the last 72 hours. No results for input(s): "HGBA1C" in the last 72 hours. No results for input(s): "CHOL", "HDL", "LDLCALC", "TRIG", "CHOLHDL", "LDLDIRECT" in the last 72 hours. No results for input(s): "TSH", "T4TOTAL", "T3FREE", "THYROIDAB" in the last 72 hours.  Invalid input(s): "FREET3" Recent Labs    12/29/22 1448  VITAMINB12 198   FOLATE 6.0   Coags: No results for input(s): "INR" in the last 72 hours.  Invalid input(s): "PT" Microbiology: No results found for this or any previous visit (from the past 240 hour(s)).  FURTHER DISCHARGE INSTRUCTIONS:  Get Medicines reviewed and adjusted: Please take all your medications with you for your next visit with your Primary MD  Laboratory/radiological data: Please  request your Primary MD to go over all hospital tests and procedure/radiological results at the follow up, please ask your Primary MD to get all Hospital records sent to his/her office.  In some cases, they will be blood work, cultures and biopsy results pending at the time of your discharge. Please request that your primary care M.D. goes through all the records of your hospital data and follows up on these results.  Also Note the following: If you experience worsening of your admission symptoms, develop shortness of breath, life threatening emergency, suicidal or homicidal thoughts you must seek medical attention immediately by calling 911 or calling your MD immediately  if symptoms less severe.  You must read complete instructions/literature along with all the possible adverse reactions/side effects for all the Medicines you take and that have been prescribed to you. Take any new Medicines after you have completely understood and accpet all the possible adverse reactions/side effects.   Do not drive when taking Pain medications or sleeping medications (Benzodaizepines)  Do not take more than prescribed Pain, Sleep and Anxiety Medications. It is not advisable to combine anxiety,sleep and pain medications without talking with your primary care practitioner  Special Instructions: If you have smoked or chewed Tobacco  in the last 2 yrs please stop smoking, stop any regular Alcohol  and or any Recreational drug use.  Wear Seat belts while driving.  Please note: You were cared for by a hospitalist during your  hospital stay. Once you are discharged, your primary care physician will handle any further medical issues. Please note that NO REFILLS for any discharge medications will be authorized once you are discharged, as it is imperative that you return to your primary care physician (or establish a relationship with a primary care physician if you do not have one) for your post hospital discharge needs so that they can reassess your need for medications and monitor your lab values.  Total Time spent coordinating discharge including counseling, education and face to face time equals greater than 30 minutes.  SignedJeoffrey Massed 01/01/2023 9:26 AM

## 2023-01-01 NOTE — Plan of Care (Signed)

## 2023-01-01 NOTE — Progress Notes (Signed)
Explained discharge instructions to patient. Reviewed follow up appointment and next medication administration times. Also reviewed education. Patient verbalized having an understanding for instructions given. All belongings are in the patient's possession. According to [facility] it's too early to disperse a refill for protonix. No other needs verbalized. Will transport downstairs for discharge.

## 2023-01-01 NOTE — Progress Notes (Deleted)
Attempted to get  UA, pt sat on toilet to urinate, states unable to use urina. Hat obtained for toilet for next void.

## 2023-01-02 ENCOUNTER — Other Ambulatory Visit: Payer: Self-pay

## 2023-01-02 DIAGNOSIS — K922 Gastrointestinal hemorrhage, unspecified: Secondary | ICD-10-CM

## 2023-01-04 ENCOUNTER — Encounter (HOSPITAL_COMMUNITY): Payer: Self-pay | Admitting: Gastroenterology

## 2023-01-07 ENCOUNTER — Ambulatory Visit (HOSPITAL_COMMUNITY)
Admission: RE | Admit: 2023-01-07 | Discharge: 2023-01-07 | Disposition: A | Discharge status: Home / Self Care | Payer: Medicare Other | Source: Ambulatory Visit | Attending: Physician Assistant | Admitting: Physician Assistant

## 2023-01-07 ENCOUNTER — Encounter (HOSPITAL_COMMUNITY): Payer: Self-pay

## 2023-01-07 VITALS — BP 118/78 | HR 72 | Wt 171.2 lb

## 2023-01-07 DIAGNOSIS — Z951 Presence of aortocoronary bypass graft: Secondary | ICD-10-CM | POA: Insufficient documentation

## 2023-01-07 DIAGNOSIS — I5032 Chronic diastolic (congestive) heart failure: Secondary | ICD-10-CM | POA: Insufficient documentation

## 2023-01-07 DIAGNOSIS — M109 Gout, unspecified: Secondary | ICD-10-CM | POA: Insufficient documentation

## 2023-01-07 DIAGNOSIS — Z7982 Long term (current) use of aspirin: Secondary | ICD-10-CM | POA: Insufficient documentation

## 2023-01-07 DIAGNOSIS — I4819 Other persistent atrial fibrillation: Secondary | ICD-10-CM | POA: Diagnosis not present

## 2023-01-07 DIAGNOSIS — Z87891 Personal history of nicotine dependence: Secondary | ICD-10-CM | POA: Insufficient documentation

## 2023-01-07 DIAGNOSIS — I255 Ischemic cardiomyopathy: Secondary | ICD-10-CM | POA: Insufficient documentation

## 2023-01-07 DIAGNOSIS — Z7901 Long term (current) use of anticoagulants: Secondary | ICD-10-CM | POA: Insufficient documentation

## 2023-01-07 DIAGNOSIS — N1832 Chronic kidney disease, stage 3b: Secondary | ICD-10-CM | POA: Insufficient documentation

## 2023-01-07 DIAGNOSIS — I2510 Atherosclerotic heart disease of native coronary artery without angina pectoris: Secondary | ICD-10-CM | POA: Diagnosis not present

## 2023-01-07 DIAGNOSIS — E1122 Type 2 diabetes mellitus with diabetic chronic kidney disease: Secondary | ICD-10-CM | POA: Insufficient documentation

## 2023-01-07 DIAGNOSIS — I350 Nonrheumatic aortic (valve) stenosis: Secondary | ICD-10-CM | POA: Insufficient documentation

## 2023-01-07 DIAGNOSIS — Z7984 Long term (current) use of oral hypoglycemic drugs: Secondary | ICD-10-CM | POA: Insufficient documentation

## 2023-01-07 DIAGNOSIS — D62 Acute posthemorrhagic anemia: Secondary | ICD-10-CM | POA: Insufficient documentation

## 2023-01-07 DIAGNOSIS — I252 Old myocardial infarction: Secondary | ICD-10-CM | POA: Insufficient documentation

## 2023-01-07 DIAGNOSIS — Z79899 Other long term (current) drug therapy: Secondary | ICD-10-CM | POA: Insufficient documentation

## 2023-01-07 DIAGNOSIS — E785 Hyperlipidemia, unspecified: Secondary | ICD-10-CM | POA: Diagnosis not present

## 2023-01-07 DIAGNOSIS — Z952 Presence of prosthetic heart valve: Secondary | ICD-10-CM | POA: Insufficient documentation

## 2023-01-07 LAB — CBC
HCT: 35 % — ABNORMAL LOW (ref 39.0–52.0)
Hemoglobin: 11.3 g/dL — ABNORMAL LOW (ref 13.0–17.0)
MCH: 29.7 pg (ref 26.0–34.0)
MCHC: 32.3 g/dL (ref 30.0–36.0)
MCV: 92.1 fL (ref 80.0–100.0)
Platelets: 300 10*3/uL (ref 150–400)
RBC: 3.8 MIL/uL — ABNORMAL LOW (ref 4.22–5.81)
RDW: 14.8 % (ref 11.5–15.5)
WBC: 10.3 10*3/uL (ref 4.0–10.5)
nRBC: 0 % (ref 0.0–0.2)

## 2023-01-07 LAB — BRAIN NATRIURETIC PEPTIDE: B Natriuretic Peptide: 237.9 pg/mL — ABNORMAL HIGH (ref 0.0–100.0)

## 2023-01-07 LAB — COMPREHENSIVE METABOLIC PANEL
ALT: 19 U/L (ref 0–44)
AST: 21 U/L (ref 15–41)
Albumin: 3.7 g/dL (ref 3.5–5.0)
Alkaline Phosphatase: 105 U/L (ref 38–126)
Anion gap: 7 (ref 5–15)
BUN: 40 mg/dL — ABNORMAL HIGH (ref 8–23)
CO2: 22 mmol/L (ref 22–32)
Calcium: 9 mg/dL (ref 8.9–10.3)
Chloride: 108 mmol/L (ref 98–111)
Creatinine, Ser: 2.8 mg/dL — ABNORMAL HIGH (ref 0.61–1.24)
GFR, Estimated: 24 mL/min — ABNORMAL LOW (ref >60)
Glucose, Bld: 167 mg/dL — ABNORMAL HIGH (ref 70–99)
Potassium: 5 mmol/L (ref 3.5–5.1)
Sodium: 137 mmol/L (ref 135–145)
Total Bilirubin: 0.4 mg/dL (ref 0.3–1.2)
Total Protein: 7.2 g/dL (ref 6.5–8.1)

## 2023-01-07 NOTE — Progress Notes (Addendum)
ADVANCED HF CLINIC NOTE  PCP: Archie Endo, FNP Primary Cardiologist: Ellis Parents, FNP HF Cardiologist: Dr. Gala Romney  HPI: Antonio Cervantes 67 y.o. male w/ T2DM, remote tobacco use, systolic heart failure/iCM, CAD, AF, and HLD.   Admitted 1/23 for NSTEMI and found to be in acute CHF and afib w/ RVR, of unknown duration but spontaneously converted back to NSR. Echo with severely reduced LVEF, 20-25%, RV normal. LHC showed severe MVCAD, however given diffuse, distal vessel disease, there were no good options for revascularization (Diffuse inoperable CAD, targets not amendable to PCI). RHC showed normal filling pressures and normal CO  5.4 L/min, cardiac index 2.88. Medical therapy for CAD was recommended. Placed on ASA, statin and ? blocker. GDMT limited by soft BP and renal insuffiency, SCr ~1.5 during hospitalization (baseline unknown). Eliquis started for Afib. Referred to [facility] clinic.    Of note, there was also concern for potential aortic stenosis base on echo interpretation, "degree of AS hard to judge due to severe decrease in LV function DVI 0.55 AVA 2 cm2 but morphology and gradients suggest AS could be moderate" .   Seen in [facility] 2/23, stable NYHA I-II symptoms. GDMT titrated. Follow up in AHF 3/23 with stable NYHA II symptoms, euvolemic. Started on low-dose losartan.  Re-admitted 3/23 with NSTEMI. Underwent cMRI showing LVEF 47%, normal RVEF, viable multi-vessel disease. Moderate AI. CVTS consulted and underwent CABG x 4 and aortic valve replacement. Course complicated by persistent AF despite amiodarone and started on Coumadin (plan to continue for 3 months then switch to Eliquis). [name], beta blocker, and Lasix stopped with low BP and bradycardia. Discharged home, weight 163 lbs.  Echo 10/18/21 EF normalized, 55-60% G2DD AoV ok.   Follow up 1/24, doing well NYHA II.   Admitted 3/24 with a/c dHF. Diuresed with IV lasix, complicated by AKI on CKD. Had been taking NSAID for  gout. Echo showed EF 55-60%, RV ok. He was discharged home, weight 164 lbs.  Admitted 09/24 with severe anemia, weakness and dyspnea. Hgb down to 6.4 in setting of NSAID use for gout attack. Required transfusions. EGD revealed gastric ulcers with placement of one clip. Colonoscopy okay. Eliquis and Aspirin initially held but later restarted. He had elevated troponin up to 1420. Cardiology consulted. Echo with stable EF, 60-65%, RV okay, mean gradient of 12 mmhg across aortic valve prosthesis. Troponin elevation thought to be likely 2/2 demand ischemia in setting of acute blood loss anemia. No further ischemic workup recommended.   Today he returns for HF follow up with his daughter-in-law and grand-daughter. He has not noticed any recurrent bleeding with aspirin and Eliquis. No shortness of breath, orthopnea, PND or lower extremity edema. No complaints of chest pain or palpitations. Taking medications as prescribed. His PCP recently referred him to wound clinic for nonhealing wounds on his anterior shins after he was bitten by fire ants about a year ago.   Cardiac Testing  - Echo (09/24): EF 60-65%, RV okay, mean gradient of 12 across aortic valve prosthesis   - Echo (3/24): EF 55-60%, RV ok, AoV ok  - Echo 6/23 EF 55-60%, GDDD, AoV ok   - cMRI (3/23): LVEF 47%, normal RVEF, mild to moderate AI, study suggests viable multi-vessel disease.  - Echo (3/23): EF 50%, mild LVH, RV ok, mild AI  - Echo (1/23): EF 20-25%, severe LV dysfunction, mild LVH, RV ok, mild MR, degree of AS hard to judge due to severe decrease in LV function DVI 0.55 AVA 2  cm2 but morphology and gradients suggest AS could be moderate .   - LHC/RHC (05/21/21):   Prox LAD to Mid LAD lesion is 75% stenosed.   Dist LAD lesion is 80% stenosed.   1st Diag lesion is 80% stenosed.   Ost RCA to Mid RCA lesion is 99% stenosed.   2nd Mrg lesion is 99% stenosed.   Dist Cx lesion is 90% stenosed.   Prox Cx to Mid Cx lesion is 75%  stenosed.   LV end diastolic pressure is normal.   There is no aortic valve stenosis.   Severe, multivessel coronary artery disease.   Normal LVEDP. Given diffuse, distal vessel disease, there are no good options for revascularization.   Right heart pressures: RA mean 2 PA pressure 26/10 mean 17 PCWP mean 6 CO/CI 5.4/2.88 (Fick)  ROS: All systems reviewed and negative except as per HPI.   Past Medical History:  Diagnosis Date   Atrial fibrillation and flutter (HCC)    Cardiac arrhythmia due to congenital heart disease    CKD (chronic kidney disease)    Diabetes mellitus without complication (HCC)    Diabetic peripheral neuropathy associated with type 2 diabetes mellitus (HCC)    Heart failure (HCC)    High blood cholesterol    Thyroid disease    Current Outpatient Medications  Medication Sig Dispense Refill   apixaban (ELIQUIS) 5 MG TABS tablet Take 1 tablet (5 mg total) by mouth 2 (two) times daily.     aspirin EC 81 MG tablet Take 1 tablet (81 mg total) by mouth daily. Swallow whole.     atorvastatin (LIPITOR) 80 MG tablet Take 1 tablet (80 mg total) by mouth daily. 30 tablet 0   dapagliflozin propanediol (FARXIGA) 10 MG TABS tablet Take 1 tablet (10 mg total) by mouth daily before breakfast. 90 tablet 3   furosemide (LASIX) 20 MG tablet Take 20 mg by mouth daily.     glipiZIDE (GLUCOTROL) 5 MG tablet Take 5 mg by mouth daily.     indomethacin (INDOCIN) 25 MG capsule Take 25 mg by mouth daily as needed for mild pain or moderate pain.     levothyroxine (SYNTHROID) 112 MCG tablet Take 112 mcg by mouth daily.     metoprolol succinate (TOPROL-XL) 25 MG 24 hr tablet Take 1 tablet (25 mg total) by mouth daily. 90 tablet 3   pantoprazole (PROTONIX) 40 MG tablet Take twice daily for 4 weeks, and then once daily thereafter. (Patient taking differently: 40 mg daily. Take twice daily for 4 weeks, and then once daily thereafter.) 90 tablet 1   No current facility-administered  medications for this encounter.   No Known Allergies  Social History   Socioeconomic History   Marital status: Married    Spouse name: Not on file   Number of children: 1   Years of education: Not on file   Highest education level: 10th grade  Occupational History   Occupation: retired  Tobacco Use   Smoking status: Former    Current packs/day: 0.00    Average packs/day: 1.5 packs/day for 15.0 years (22.5 ttl pk-yrs)    Types: Cigarettes    Start date: 86    Quit date: 1983    Years since quitting: 41.7   Smokeless tobacco: Never  Vaping Use   Vaping status: Never Used  Substance and Sexual Activity   Alcohol use: No   Drug use: No   Sexual activity: Not on file  Other Topics Concern  Not on file  Social History Narrative   Not on file   Social Determinants of Health   Financial Resource Strain: Low Risk  (05/21/2021)   Overall Financial Resource Strain (CARDIA)    Difficulty of Paying Living Expenses: Not very hard  Food Insecurity: No Food Insecurity (12/28/2022)   Hunger Vital Sign    Worried About Running Out of Food in the Last Year: Never true    Ran Out of Food in the Last Year: Never true  Transportation Needs: No Transportation Needs (12/28/2022)   PRAPARE - Administrator, Civil Service (Medical): No    Lack of Transportation (Non-Medical): No  Physical Activity: Not on file  Stress: Not on file  Social Connections: Not on file  Intimate Partner Violence: Not At Risk (12/28/2022)   Humiliation, Afraid, Rape, and Kick questionnaire    Fear of Current or Ex-Partner: No    Emotionally Abused: No    Physically Abused: No    Sexually Abused: No   Family History  Problem Relation Age of Onset   Diabetes Mother    Diabetes Father    Throat cancer Brother    Heart attack Maternal Uncle    Sudden Cardiac Death Neg Hx    Liver disease Neg Hx    Colon cancer Neg Hx    Wt Readings from Last 3 Encounters:  01/07/23 77.7 kg (171 lb 3.2 oz)   12/31/22 73.9 kg (162 lb 14.7 oz)  12/17/22 77.1 kg (170 lb)   BP 118/78   Pulse 72   Wt 77.7 kg (171 lb 3.2 oz)   SpO2 100%   BMI 27.63 kg/m   PHYSICAL EXAM: General:  Well appearing. HEENT: normal Neck: supple. no JVD. Carotids 2+ bilat; no bruits.  Cor: PMI nondisplaced. Regular rate & rhythm. No rubs, gallops or murmurs. Lungs: clear Abdomen: soft, nontender, nondistended.  Extremities: no cyanosis, clubbing, rash, edema, wounds in various stages of healing on anterior shins Neuro: alert & orientedx3. Affect pleasant   ECG (personally reviewed): SR with 1st degree AVB, 66 bpm  ASSESSMENT & PLAN:  1. Chronic Systolic Heart Failure>>Chronic Diastolic CHF - Ischemic CM.  - Echo 1/23 EF 20-25%, RV ok - LHC (1/23): w/ severe MVCAD. RHC w/ normal filling pressures and preserved CO 5.4 L/min, cardiac index 2.88 - Echo (3/23): improved EF 50%, mild LVH, RV ok, mild AI - cMRI (3/23): LVEF 47%, RVEF 48%, viable multi-vessel disease, moderate AI - s/p CABG 4/23 - Echo 6/23 EF  normalized, 55-60% G2DD Aov ok   - Echo (3/24): EF 55-60%, RV ok - Echo (09/24): EF 60-65%, RV okay - NYHA II. Volume looks good today. Continue lasix 20 mg daily. - GDMT limited by hyperkalemia and CKD - Continue Farxiga 10 mg daily. - No spiro with CKD and hyperkalemia. - Off losartan with worsening renal function and hyperkalemia (failed x 2). - Labs today.    2. CAD - NSTEMI 1/23, LHC w/ severe, diffuse MVCAD--> medically managed. - NSTEMI 3/23, s/p CABG x 4, LIMA to LAD, SVG to diagonal, SVG to OM, SVG to distal RCA and AVR - Troponin elevation up to 1400 in 09/24 in setting of GI bleed. Likely demand ischemia. No further ischemic workup planned. - Denies chest pain. - Continue ASA 81 + atorva 80 daily + beta blocker. - LDL 53 (1/24)   3. PAF - SR today - Continue Toprol. - Continue Eliquis 5 mg bid. Check CBC today  4. Aortic Stenosis  -  s/p aortic valve replacement, stable on echo  09/24 - Aware of SBE prophylaxis   5. CKD IIIb - Baseline Scr previously 1.5, averaging 2.1-2.4 since March.  - Scr up to 2.6 during recent admission - Labs today.   6. Type 2DM - Hgb A1c 7.6  - followed by PCP  - Continue SGLT2i   7. HLD  - Continue atorva 80 mg - LDL 53 (1/24), LDL goal < 70   8. Remote Tobacco Use - Quit 40 years ago.  9. Acute blood loss anemia GI bleed - GI bleed 09/24 in setting of NSAID use. Had gastric ulcers, treated with 1 clip. Multiple transfusions. Colonoscopy okay. - Check CBC today - Avoid NSAIDs for gout   Follow up 6 months   Kayler Buckholtz N, PA-C  5:31 PM

## 2023-01-07 NOTE — Patient Instructions (Addendum)
Thank you for coming in today  If you had labs drawn today, any labs that are abnormal the clinic will call you No news is good news  Medications: No changes  Follow up appointments:  Your physician recommends that you schedule a follow-up appointment in:  6 months With Dr. Gala Romney    Do the following things EVERYDAY: Weigh yourself in the morning before breakfast. Write it down and keep it in a log. Take your medicines as prescribed Eat low salt foods--Limit salt (sodium) to 2000 mg per day.  Stay as active as you can everyday Limit all fluids for the day to less than 2 liters   At the [facility], you and your health needs are our priority. As part of our continuing mission to provide you with exceptional heart care, we have created designated Provider Care Teams. These Care Teams include your primary Cardiologist (physician) and Advanced Practice Providers (APPs- Physician Assistants and Nurse Practitioners) who all work together to provide you with the care you need, when you need it.   You may see any of the following providers on your designated Care Team at your next follow up: Dr Arvilla Meres Dr Marca Ancona Dr. Marcos Eke, NP Robbie Lis, [place] [facility] [place], [place] Brynda Peon, NP Karle Plumber, PharmD   Please be sure to bring in all your medications bottles to every appointment.    Thank you for choosing [facility]  If you have any questions or concerns before your next appointment please send [place] a message through [place] or call our office at [phone].    TO LEAVE A MESSAGE FOR THE NURSE SELECT OPTION 2, PLEASE LEAVE A MESSAGE INCLUDING: YOUR NAME DATE OF BIRTH CALL BACK NUMBER REASON FOR CALL**this is important as we prioritize the call backs  YOU WILL RECEIVE A CALL BACK THE SAME DAY AS LONG AS YOU CALL BEFORE 4:00 PM

## 2023-01-08 ENCOUNTER — Telehealth (HOSPITAL_COMMUNITY): Payer: Self-pay

## 2023-01-08 DIAGNOSIS — I5020 Unspecified systolic (congestive) heart failure: Secondary | ICD-10-CM

## 2023-01-08 MED ORDER — FUROSEMIDE 20 MG PO TABS: ORAL_TABLET | ORAL | 3 refills | Status: DC

## 2023-01-08 NOTE — Telephone Encounter (Addendum)
Spoke to patient and his Daughter [name] and she stated pt had a fall at home on 01/06/23 and LF has been made aware and his med list has been updated to reflect his med change. In addition, Patient labs has been ordered and appointment scheduled. Pt aware, agreeable, and verbalized understanding.

## 2023-01-08 NOTE — Telephone Encounter (Signed)
-----   Message from [facility] [place] [facility] sent at 01/08/2023  1:23 PM EDT ----- Kidney function looks worse. Would have him cut back his lasix to 20 mg M, W, F. Repeat BMET in weeks

## 2023-01-12 NOTE — Progress Notes (Deleted)
Cardiology Office Note    Patient Name: Antonio Cervantes Date of Encounter: 01/12/2023  Primary Care Provider:  Ellis Parents, FNP Primary Cardiologist:  None Primary Electrophysiologist: None   Past Medical History    Past Medical History:  Diagnosis Date   Atrial fibrillation and flutter (HCC)    Cardiac arrhythmia due to congenital heart disease    CKD (chronic kidney disease)    Diabetes mellitus without complication (HCC)    Diabetic peripheral neuropathy associated with type 2 diabetes mellitus (HCC)    Heart failure (HCC)    High blood cholesterol    Thyroid disease     History of Present Illness  Antonio Cervantes is a 67 y.o. male with a PMH of CAD s/p NSTEMI initially on 1/23 with medical therapy, NSTEMI 06/2021 with three-vessel disease, CABG x 4 with AVR, ICM, persistent AF, hypothyroidism, HFrEF, CKD stage IIIb,DM type II, HLD, GI bleed who presents today for hospital follow-up.  Antonio Cervantes  was seen initially in 05/20/2021 for complaint of chest pain and was found to have NSTEMI with acute CHF and new onset AF with RVR.  LHC was performed showing diffuse underlying CAD with 100% RCA, diffuse LAD and circumflex disease that was deemed nongraftable. Echo showed severely reduced LVEF, 20-25%, RV normal.  He converted spontaneously to sinus rhythm and medical therapy was recommended for treatment.  GDMT for HFrEF was limited due to soft BP and renal insufficiency.  He was started on Eliquis at discharge as well as [place] and digoxin.  He was admitted 07/24/2021 with complaint of chest pain with troponin trending upward [id number]. cMRI was completed showing LVEF 47%, normal RVEF, viable multi-vessel disease. CVTS was consulted and patient underwent CABG x 4 with AVR with postoperative course complicated by persistent AF that was resistant to amiodarone.  2D echo was completed 10/18/2021 showing normalized EF of 55 to 60% with grade 2 DD AV working well.  He was admitted on  07/12/2021 with complaint of chest pain with no ACS based on workup and found to have acute on chronic CHF exacerbation treated with diuretics and complicated by AKI.  He was admitted on on 12/28/2022 with complaints of dyspnea and dizziness with lightheadedness.  Hemoglobin was 6.4 in the setting of NSAID use for gout and patient found to have melena on digital exam.  Troponins were significantly elevated thought to be related to demand ischemia in the setting of anemia.  EKG completed showing sinus with inferior lateral ST segment changes consistent with ischemia.  He was transfused with 2 units of PRBC and was consulted by GI and completed EGD that showed gastric ulcer that was stable with no further bleeding.  Colonoscopy also completed showing no mass or polyps after exam.  Patient had additional unit of PRBC and 2D echo was completed showing EF of 60 to 65% with no RWMA, mild MVR with normal gradient at AV valve.  He was started on Protonix x 4 weeks then once daily and advised to avoid NSAIDs.  He was cleared to resume Eliquis on 01/04/2023  During today's visit the patient reports*** .  Patient denies chest pain, palpitations, dyspnea, PND, orthopnea, nausea, vomiting, dizziness, syncope, edema, weight gain, or early satiety.  ***Notes: Lasix reduced to M,W,Fri -Last ischemic evaluation: -Last echo: -Interim ED visits: Review of Systems  Please see the history of present illness.    All other systems reviewed and are otherwise negative except as noted above.  Physical Exam  Wt Readings from Last 3 Encounters:  01/07/23 171 lb 3.2 oz (77.7 kg)  12/31/22 162 lb 14.7 oz (73.9 kg)  12/17/22 170 lb (77.1 kg)   WG:NFAOZ were no vitals filed for this visit.,There is no height or weight on file to calculate BMI. GEN: Well nourished, well developed in no acute distress Neck: No JVD; No carotid bruits Pulmonary: Clear to auscultation without rales, wheezing or rhonchi  Cardiovascular: Normal  rate. Regular rhythm. Normal S1. Normal S2.   Murmurs: There is no murmur.  ABDOMEN: Soft, non-tender, non-distended EXTREMITIES:  No edema; No deformity   EKG/LABS/ Recent Cardiac Studies   ECG personally reviewed by me today - ***  Risk Assessment/Calculations:   {Does this patient have ATRIAL FIBRILLATION?:[id number]}      Lab Results  Component Value Date   WBC 10.3 01/07/2023   HGB 11.3 (L) 01/07/2023   HCT 35.0 (L) 01/07/2023   MCV 92.1 01/07/2023   PLT 300 01/07/2023   Lab Results  Component Value Date   CREATININE 2.80 (H) 01/07/2023   BUN 40 (H) 01/07/2023   NA 137 01/07/2023   K 5.0 01/07/2023   CL 108 01/07/2023   CO2 22 01/07/2023   Lab Results  Component Value Date   CHOL 95 05/22/2022   HDL 26 (L) 05/22/2022   LDLCALC 53 05/22/2022   TRIG 81 05/22/2022   CHOLHDL 3.7 05/22/2022    Lab Results  Component Value Date   HGBA1C 7.6 (H) 07/11/2022   Assessment & Plan    1.  HFrEF/ICM: -Most recent 2D echo completed on 12/28/2022 showing EF of 60 to 65% no RWMA and mild concentric LVH, mild MVR -Today patient reports***  2.  Coronary artery disease: -s/p NSTEMI 06/2021 with CABG x 4 completed 07/2021 elevated troponin during admission recent admission due to demand ischemia -Patient reports***  3.  Persistent AF: -Patient maintaining sinus rhythm -Eliquis was held during admission with guidance to restart 9/8  4.  Nonrheumatic AVR: -s/p AVR repair 07/2021 -SBE prophylaxis discussed  5.  CKD stage IV: -Patient's most recent creatinine was***      Disposition: Follow-up with None or APP in *** months {Are you ordering a CV Procedure (e.g. stress test, cath, DCCV, TEE, etc)?   Press F2        :[id number]}   Signed, Napoleon Form, Leodis Rains, NP 01/12/2023, 8:41 AM [facility]

## 2023-01-14 ENCOUNTER — Ambulatory Visit: Payer: Medicare Other | Attending: Nurse Practitioner | Admitting: Nurse Practitioner

## 2023-01-14 DIAGNOSIS — I5020 Unspecified systolic (congestive) heart failure: Secondary | ICD-10-CM

## 2023-01-14 DIAGNOSIS — I480 Paroxysmal atrial fibrillation: Secondary | ICD-10-CM

## 2023-01-14 DIAGNOSIS — N183 Chronic kidney disease, stage 3 unspecified: Secondary | ICD-10-CM

## 2023-01-14 DIAGNOSIS — N1832 Chronic kidney disease, stage 3b: Secondary | ICD-10-CM

## 2023-01-14 DIAGNOSIS — E1122 Type 2 diabetes mellitus with diabetic chronic kidney disease: Secondary | ICD-10-CM

## 2023-01-14 DIAGNOSIS — I2510 Atherosclerotic heart disease of native coronary artery without angina pectoris: Secondary | ICD-10-CM

## 2023-01-15 ENCOUNTER — Other Ambulatory Visit (HOSPITAL_COMMUNITY): Payer: Medicare Other

## 2023-01-20 ENCOUNTER — Other Ambulatory Visit (HOSPITAL_COMMUNITY): Payer: Medicare Other

## 2023-01-21 ENCOUNTER — Ambulatory Visit (HOSPITAL_COMMUNITY)
Admission: RE | Admit: 2023-01-21 | Discharge: 2023-01-21 | Disposition: A | Discharge status: Home / Self Care | Payer: Medicare Other | Source: Ambulatory Visit | Attending: Cardiology | Admitting: Cardiology

## 2023-01-21 DIAGNOSIS — I5020 Unspecified systolic (congestive) heart failure: Secondary | ICD-10-CM | POA: Diagnosis not present

## 2023-01-21 LAB — BASIC METABOLIC PANEL
Anion gap: 10 (ref 5–15)
BUN: 38 mg/dL — ABNORMAL HIGH (ref 8–23)
CO2: 19 mmol/L — ABNORMAL LOW (ref 22–32)
Calcium: 8.8 mg/dL — ABNORMAL LOW (ref 8.9–10.3)
Chloride: 107 mmol/L (ref 98–111)
Creatinine, Ser: 2.33 mg/dL — ABNORMAL HIGH (ref 0.61–1.24)
GFR, Estimated: 30 mL/min — ABNORMAL LOW (ref >60)
Glucose, Bld: 225 mg/dL — ABNORMAL HIGH (ref 70–99)
Potassium: 4.7 mmol/L (ref 3.5–5.1)
Sodium: 136 mmol/L (ref 135–145)

## 2023-01-29 ENCOUNTER — Telehealth (HOSPITAL_COMMUNITY): Payer: Self-pay | Admitting: Pharmacy Technician

## 2023-01-29 NOTE — Telephone Encounter (Signed)
Advanced Heart Failure Patient Advocate Encounter  Medication Samples have been provided to the patient.  Drug name: Eliquis       Strength: 5mg         Qty: 4 boxes  LOT: [id number]  Exp.Date: 12/2023  Dosing instructions: Take 1 tablet by mouth twice daily.   Called and spoke with the patients daughter. They have been instructed regarding the correct time, dose, and frequency of taking this medication, including desired effects and most common side effects.   Antonio Cervantes 1:13 PM 01/29/2023

## 2023-01-30 DIAGNOSIS — K922 Gastrointestinal hemorrhage, unspecified: Secondary | ICD-10-CM | POA: Diagnosis not present

## 2023-01-30 DIAGNOSIS — L989 Disorder of the skin and subcutaneous tissue, unspecified: Secondary | ICD-10-CM | POA: Diagnosis not present

## 2023-01-30 DIAGNOSIS — Z23 Encounter for immunization: Secondary | ICD-10-CM | POA: Diagnosis not present

## 2023-01-30 DIAGNOSIS — I214 Non-ST elevation (NSTEMI) myocardial infarction: Secondary | ICD-10-CM | POA: Diagnosis not present

## 2023-02-19 NOTE — Progress Notes (Deleted)
Cardiology Office Note:    Date:  02/19/2023  ID:  Antonio Cervantes, DOB 1955/07/10, MRN [id number] PCP: Ellis Parents, FNP  [facility] Providers Cardiologist:  None { Click to update primary MD,subspecialty MD or APP then REFRESH:1}    {Click to Open Review  :1}   Patient Profile:      Coronary artery disease Ischemic cardiomyopathy Chronic heart failure Atrial fibrillation Hyperlipidemia Type 2 diabetes CKD stage IV Hypothyroidism      History of Present Illness:  Discussed the use of AI scribe software for clinical note transcription with the patient, who gave verbal consent to proceed.  Antonio Cervantes is a 67 y.o. 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  He initially suffered a NSTEMI and acute systolic heart failure with associated A-fib RVR on 04/2021.  EF at the time was 2025%, cardiac cath on 05/17/2021 revealed severe multivessel CAD and no good option for revascularization.  He was medically managed.  He did have recurrent admission for another NSTEMI on 06/2021.  At the time echocardiogram on 07/24/2021 showed improved EF of 50%, no RWMA, grade 2 DD, with mild aortic stenosis with mean gradient . He had a cardiac MRI which suggested LVEF 45%, viable myocardium, mild to moderate aortic stenosis.  At this time on/06/2021 he underwent CABG x 4 with LIMA to LAD, SVG to diagonal, SVG to OM, SVG to distal RCA, and aortic valve replacement.  He was on Coumadin for 3 months before he transitioned to Eliquis.  Lasix, metoprolol, spironolactone were stopped due to hypotension and bradycardia at this time.  On 10/18/2021 repeat echo showed EF improved to 55 to 60%, no RWMA, grade 2 DD,  normal aortic valve prosthesis and function with mean gradient .   On 06/2022 he was admitted for acute diastolic heart failure where it was complicated by AKI on CKD while receiving IV diuresis and taking NSAIDs for gout.  Echocardiogram at this time showed LVEF of 55 to 60%, mildly reduced RV, mild MR, normal structure and function her aortic valve prosthesis.  Seen in office on 09/05/2022, he was euvolemic at the time however GDMT had been limited due to hyperkalemia and CKD.  Losartan has been stopped given worsening CKD and spironolactone was not appropriate due to history of hyperkalemia.  He was advised to continue Toprol-XL 25 mg daily, Lasix 20 mg daily, Farxiga 10 mg daily, aspirin 81 mg daily, atorvastatin 80 mg daily.  Antonio Cervantes presented to ER on 12/28/22 for weakness, lightheadedness, shortness of breath, and fall at his doctors office where he hit his head.  He noted mid substernal chest pain that started on 12/28/2022 along with shortness of breath.  On admission creatinine 2.45, GFR 28, BNP 217, WBC 13,300, hemoglobin 6.4.  His troponins were elevated at 14, 83, 1420.  His EKG on 12/28/2022 showed normal sinus rhythm, ST depression noted of inferior lateral leads that appear to be new comparing to old EKG.  He was admitted to hospital service for acute symptomatic anemia and transfused a total of 3 PRBCs.  GI was consulted who recommended EGD and colonoscopy.  EGD showed gastric ulcer with recent evidence of bleeding, colonoscopy okay.  He is hemoglobin stabilized, GI recommended twice daily Protonix for 4  weeks, and to avoid NSAIDs in the future.  He did have a echocardiogram revealing LVEF of 60 to 65%, no RWMA, mild LVH, normal diastolic parameter, normal RV, mild MR, normal AVR function.  During admission cardiology was consulted for new given elevated troponin.  It was suspected that elevated troponin was due to demand ischemia in the setting of acute blood loss anemia and worsening CKD.   His echo was reassuring, invasive ischemic evaluation was not indicated as active GI bleed prohibited blood thinner use and progression of CKD prohibited contrast use.  He maintained normal sinus rhythm during admission.  He was okay to resume Eliquis on 01/04/2023.   History of Present Illness            ROS   See HPI ***    Studies Reviewed:       *** Risk Assessment/Calculations:   {Does this patient have ATRIAL FIBRILLATION?[phone]} No BP recorded.  {Refresh Note OR Click here to enter BP  :1}***       Physical Exam:   VS:  There were no vitals taken for this visit.   Wt Readings from Last 3 Encounters:  01/07/23 171 lb 3.2 oz (77.7 kg)  12/31/22 162 lb 14.7 oz (73.9 kg)  12/17/22 170 lb (77.1 kg)    Physical Exam***     Assessment and Plan:  Assessment and Plan              {Are you ordering a CV Procedure (e.g. stress test, cath, DCCV, TEE, etc)?   Press F2        :[id number]}  Dispo:  No follow-ups on file.  Signed, Denyce Robert, AGNP-C

## 2023-02-20 ENCOUNTER — Ambulatory Visit: Payer: Medicare Other | Admitting: Nurse Practitioner

## 2023-03-02 ENCOUNTER — Telehealth: Payer: Self-pay | Admitting: Gastroenterology

## 2023-03-02 NOTE — Telephone Encounter (Signed)
Good afternoon Dr. Chales Abrahams,   We received a call from this patient's daughter in law, requesting to reschedule his Endo/ Colon scheduled for tomorrow at 1:30 PM. She states they were unsure of the date and the patient did not prep properly. The patient was rescheduled for 1/7 at 2:00 PM.   Thank you.

## 2023-03-03 ENCOUNTER — Telehealth: Payer: Self-pay | Admitting: Interventional Cardiology

## 2023-03-03 ENCOUNTER — Encounter: Payer: Self-pay | Admitting: Cardiology

## 2023-03-03 ENCOUNTER — Encounter: Payer: Medicare Other | Admitting: Gastroenterology

## 2023-03-03 NOTE — Telephone Encounter (Signed)
Error

## 2023-03-03 NOTE — Telephone Encounter (Signed)
STAT if patient feels like he/she is going to faint   1. Are you feeling dizzy, lightheaded, or faint right now? Daughter states he feels dizzy now    2. Have you passed out?  No  (If yes move to .SYNCOPECHMG)   3. Do you have any other symptoms? weakness   4. Have you checked your HR and BP (record if available)? HR 74, BP 98/61, this morning it was 103/63   Pt c/o swelling/edema: STAT if pt has developed SOB within 24 hours  If swelling, where is the swelling located? stomach  How much weight have you gained and in what time span? 10lbs in the past couple of days   Have you gained 2 pounds in a day or 5 pounds in a week? 5lbs in a weeks   Do you have a log of your daily weights (if so, list)? no  Are you currently taking a fluid pill? Yes   Are you currently SOB? no  Have you traveled recently in a car or plane for an extended period of time? no

## 2023-03-03 NOTE — Telephone Encounter (Deleted)
STAT if patient feels like he/she is going to faint   1. Are you feeling dizzy, lightheaded, or faint right now? Daughter states he feels dizzy now    2. Have you passed out?  No  (If yes move to .SYNCOPECHMG)   3. Do you have any other symptoms? weakness   4. Have you checked your HR and BP (record if available)? HR 74, BP 98/61, this morning it was 103/63   Pt c/o swelling/edema: STAT if pt has developed SOB within 24 hours  If swelling, where is the swelling located? stomach  How much weight have you gained and in what time span? 10lbs in the past couple of days   Have you gained 2 pounds in a day or 5 pounds in a week? 5lbs in a weeks   Do you have a log of your daily weights (if so, list)? no  Are you currently taking a fluid pill? Yes   Are you currently SOB? no  Have you traveled recently in a car or plane for an extended period of time? no

## 2023-03-03 NOTE — Telephone Encounter (Signed)
Called and spoke w patient and daughter in law.  The patient since the weekend has been having worsening symptoms.  His weight is usual 161 and was 170 today.  His abd is very full - swollen.  He reports lightheadedness when he stands.  Per daughter in law he seems short of breath but patient denies.  He is mostly just sitting.  He is weak, not sleeping well. SBP today is only 90.  He takes lasix 20 mg every mon, wed, fri and is also on [place].    She made him an appointment tomorrow w PCP but I recommended they bring him to the hospital today for better, sooner evaluation and treatment.  They will come to [facility] ER.    Aware I will let HF clinic know.

## 2023-03-09 ENCOUNTER — Telehealth (HOSPITAL_COMMUNITY): Payer: Self-pay | Admitting: Cardiology

## 2023-03-09 NOTE — Telephone Encounter (Signed)
Daughter called to request eliquis samples. Co pay is unaffordable at this time   Medication Samples have been provided to the patient.  Drug name: eliquis       Strength: 5mg         Qty: 56  LOT: [id number]  Exp.Date: 02/26  Dosing instructions: one tab twice a day  The patient has been instructed regarding the correct time, dose, and frequency of taking this medication, including desired effects and most common side effects.   Magda Bernheim M 10:30 AM 03/09/2023

## 2023-03-10 ENCOUNTER — Other Ambulatory Visit (HOSPITAL_COMMUNITY): Payer: Self-pay | Admitting: Cardiology

## 2023-03-23 ENCOUNTER — Ambulatory Visit: Payer: Medicare Other | Attending: Nurse Practitioner | Admitting: Nurse Practitioner

## 2023-03-23 NOTE — Progress Notes (Deleted)
Cardiology Office Note:    Date:  03/23/2023  ID:  Antonio Cervantes, DOB 06-Feb-1956, MRN [id number] PCP: Ellis Parents, FNP  [facility] Providers Cardiologist:  None { Click to update primary MD,subspecialty MD or APP then REFRESH:1}    {Click to Open Review  :1}   Patient Profile:      Antonio Cervantes is a 67 year old male with history of T2 DM, tobacco use, systoloic HF with recovered EF/ICM, CAD s/p CABG 2023, aortic valve replacement in 2023, atrial fibrillation, hyperlipidemia, CKD stage IV, hypothyroidism.  He was admitted 04/2021 for NSTEMI and found to be in acute CHF and A-fib with RVR.  A-fib of unknown duration but spontaneous converted back to NSR.  Echo with severely reduced LVEF of 20-25%, and RV normal.  LHC showed severe multivessel CAD however given diffuse distal vessel disease there is no good options for revascularization.  RHC showed normal filling pressures and normal CO 5.4L/min, cardiac index 2.88.  Medical therapy for CAD was recommended, placed on ASA, statin, beta-blocker.  His GDMT was limited by soft blood pressures and renal insufficiency.  Started on Eliquis for A-fib.  Admitted 06/2021 with NSTEMI.  Underwent cardiac MRI showing LVEF 47%, normal RVEF, viable multivessel disease, moderate AI.  Cardiothoracic surgery consulted and he underwent CABG x 4 and aortic valve replacement using a 23 mm Edwards INSPIRIS RESILIA pericardial valve on 07/29/2021.  LIMA to LAD, SVG to diagonal, SVG to OM, SVG to distal RCA and AVR. Hospital course complicated by persistent A-fib despite amiodarone.  Repeat echo 10/18/2021 EF normalized at 55-60%, grade 2 diastolic dysfunction, normal RV, mild LAE and RAE, mild MR, no aortic stenosis, normal aortic valve prosthesis with mean gradient 6 mmHg.  He was admitted 07/16/2022 with heart failure course complicated by AKI on CKD.  Echocardiogram repeated 07/12/2022 with LVEF 55-60%, indeterminate diastolic parameters, mild reduced  RV, mild MR, normal structure and function aortic valve prosthesis with mean gradient 6 mmHg.  He was discharged on Lasix, at follow-up appointment with heart failure clinic on 07/25/2022 Lasix was increased to 40 mg daily advised to continue losartan, Toprol, Farxiga for GDMT.  He was seen 09/05/2022 where he was felt to be euvolemic.  GDMT limited due to hyperkalemia and CKD.  Losartan had been stopped given worsening CKD and spironolactone was felt not appropriate due to hyperkalemia.  He was admitted again on 12/28/2022 for severe anemia and dyspnea.  Hemoglobin down to 6.4 in setting of NSAID use for gout attack.  He did require transfusions, EGD revealed gastric ulcers with placement of clip.  He had an elevated troponin up to 14, 83, 1420.  Echo with stable EF 60 to 65%, no RWMA, RV normal, mean gradient of 12 mmHg across aortic valve prosthesis.  Troponin elevation likely in setting of demand ischemia from acute blood loss anemia.      History of Present Illness:  Discussed the use of AI scribe software for clinical note transcription with the patient, who gave verbal consent to proceed.  Antonio Cervantes is a 67 y.o. male who returns for ***Discussed the use of AI scribe software for clinical note transcription with the patient, who gave verbal consent to proceed.  History of Present Illness            ROS   See HPI ***    Studies Reviewed:       Echocardiogram 12/29/2022 1. Left ventricular ejection fraction, by estimation, is 60 to 65%. The  left ventricle has normal function. The left ventricle has no regional  wall motion abnormalities. There is mild concentric left ventricular  hypertrophy. Left ventricular diastolic  parameters were normal.   2. Right ventricular systolic function is normal. The right ventricular  size is normal. Tricuspid regurgitation signal is inadequate for assessing  PA pressure.   3. The mitral valve is degenerative. Mild mitral valve regurgitation.    4. The aortic valve has been repaired/replaced. Aortic valve  regurgitation is not visualized. There is a 23 mm Edwards Inspiris Resilia  pericardial valve present in the aortic position. Aortic valve mean  gradient measures 12.0 mmHg.   5. The inferior vena cava is normal in size with <50% respiratory  variability, suggesting right atrial pressure of 8 mmHg.   Echocardiogram 07/12/2022 1. Left ventricular ejection fraction, by estimation, is 55 to 60%. The  left ventricle has normal function. The left ventricle has no regional  wall motion abnormalities. Left ventricular diastolic parameters are  indeterminate.   2. Right ventricular systolic function is mildly reduced. The right  ventricular size is normal. Tricuspid regurgitation signal is inadequate  for assessing PA pressure.   3. The mitral valve is normal in structure. Mild mitral valve  regurgitation. No evidence of mitral stenosis.   4. The aortic valve has been repaired/replaced. Aortic valve  regurgitation is not visualized. There is a 23 mm Edwards INSPIRIS RESILIA  pericardial valve valve present in the aortic position. Procedure Date:  07/29/21. Echo findings are consistent with  normal structure and function of the aortic valve prosthesis. Aortic valve  area, by VTI measures 2.02 cm. Aortic valve mean gradient measures 6.0  mmHg. Aortic valve Vmax measures 1.76 m/s.   5. The inferior vena cava is normal in size with greater than 50%  respiratory variability, suggesting right atrial pressure of 3 mmHg.   R/L heart catheterization 05/21/2021   Prox LAD to Mid LAD lesion is 75% stenosed.   Dist LAD lesion is 80% stenosed.   1st Diag lesion is 80% stenosed.   Ost RCA to Mid RCA lesion is 99% stenosed.   2nd Mrg lesion is 99% stenosed.   Dist Cx lesion is 90% stenosed.   Prox Cx to Mid Cx lesion is 75% stenosed.   LV end diastolic pressure is normal.   There is no aortic valve stenosis.   Severe, multivessel coronary  artery disease.  Normal LVEDP.   Given diffuse, distal vessel disease, there are no good options for revascularization.  Plan for medical therapy to try to improve his LV function.  Probable heart failure consultation as well. Diagnostic Dominance: Right  Risk Assessment/Calculations:   {Does this patient have ATRIAL FIBRILLATION?:[phone]} No BP recorded.  {Refresh Note OR Click here to enter BP  :1}***       Physical Exam:   VS:  There were no vitals taken for this visit.   Wt Readings from Last 3 Encounters:  01/07/23 171 lb 3.2 oz (77.7 kg)  12/31/22 162 lb 14.7 oz (73.9 kg)  12/17/22 170 lb (77.1 kg)    Physical Exam***     Assessment and Plan:  Assessment and Plan              {Are you ordering a CV Procedure (e.g. stress test, cath, DCCV, TEE, etc)?   Press F2        :[id number]}  Dispo:  No follow-ups on file.  Signed, Denyce Robert, NP

## 2023-03-30 ENCOUNTER — Telehealth: Payer: Self-pay | Admitting: *Deleted

## 2023-03-30 NOTE — Telephone Encounter (Signed)
Patient is on Eliquis, had previously received clearance 11/2022 for EGD.  Would you like to obtain another clearance?  If so, please forward to your CMA. Thank you!

## 2023-04-01 ENCOUNTER — Telehealth: Payer: Self-pay

## 2023-04-01 NOTE — Telephone Encounter (Signed)
[facility] Pre-operative Risk Assessment     Request for surgical clearance:     Endoscopy Procedure  What type of surgery is being performed?     EGD/Colon  When is this surgery scheduled?     05-05-2023  What type of clearance is required ?   Pharmacy  Are there any medications that need to be held prior to surgery and how long? Eliquis 1 day hold  Practice name and name of physician performing surgery?      [facility]  What is your office phone and fax number?      Phone- [phone]  Fax- [phone]  Anesthesia type (None, local, MAC, general) ?       MAC   Please route your response to [employer], [facility])

## 2023-04-01 NOTE — Telephone Encounter (Signed)
Clearance created and will follow up on it

## 2023-04-01 NOTE — Telephone Encounter (Signed)
Needs cardiac clearance for EGD/colonoscopy Also clearance to hold Eliquis 24 hours before. Also note, he will need a 2-day colonoscopy prep  RG

## 2023-04-01 NOTE — Telephone Encounter (Signed)
Pharmacy, can you please comment on how long Eliquis can be held prior to upcoming EGD/ colonoscopy?  Thank you!

## 2023-04-03 NOTE — Telephone Encounter (Signed)
Patient with diagnosis of afib on Eliquis for anticoagulation.    Procedure:  EGD/Colon  Date of procedure: 05/05/23   CHA2DS2-VASc Score = 5   This indicates a 7.2% annual risk of stroke. The patient's score is based upon: CHF History: 1 HTN History: 1 Diabetes History: 1 Stroke History: 0 Vascular Disease History: 1 Age Score: 1 Gender Score: 0     CrCl 33 mL/min Platelet count 300 K    Per office protocol, patient can hold Eliquis for 1-2 days prior to procedure.     **This guidance is not considered finalized until pre-operative APP has relayed final recommendations.**

## 2023-04-03 NOTE — Telephone Encounter (Signed)
     Primary Cardiologist: Dr. Gala Romney  Chart reviewed as part of pre-operative protocol coverage by clinical pharmacist.  The following recommendations were provided for Antonio Cervantes   Patient with diagnosis of afib on Eliquis for anticoagulation.     Procedure:  EGD/Colon  Date of procedure: 05/05/23     CHA2DS2-VASc Score = 5   This indicates a 7.2% annual risk of stroke. The patient's score is based upon: CHF History: 1 HTN History: 1 Diabetes History: 1 Stroke History: 0 Vascular Disease History: 1 Age Score: 1 Gender Score: 0       CrCl 33 mL/min Platelet count 300 K       Per office protocol, patient can hold Eliquis for 1-2 days prior to procedure.  I will route this recommendation to the requesting party via Epic fax function and remove from pre-op pool.  Please call with questions.  Thomasene Ripple. Lolita Faulds NP-C     04/03/2023, 8:37 AM [facility] [address] Office [phone] Fax [phone]

## 2023-04-06 ENCOUNTER — Ambulatory Visit (AMBULATORY_SURGERY_CENTER): Payer: Medicare Other | Admitting: *Deleted

## 2023-04-06 ENCOUNTER — Telehealth: Payer: Self-pay | Admitting: *Deleted

## 2023-04-06 VITALS — Ht 66.0 in | Wt 170.0 lb

## 2023-04-06 DIAGNOSIS — K259 Gastric ulcer, unspecified as acute or chronic, without hemorrhage or perforation: Secondary | ICD-10-CM

## 2023-04-06 DIAGNOSIS — Z1211 Encounter for screening for malignant neoplasm of colon: Secondary | ICD-10-CM

## 2023-04-06 NOTE — Progress Notes (Signed)
Pt's name and DOB verified at the beginning of the pre-visit wit 2 identifiers  Pt denies any difficulty with ambulating,sitting, laying down or rolling side to side  Pt has no issues with ambulation   Pt has no issues moving head neck but does with swallowing  No egg or soy allergy known to patient   No issues known to pt with past sedation with any surgeries or procedures  Pt denies having issues being intubated  No FH of Malignant Hyperthermia  Pt is not on diet pills or shots  Pt is not on home 02   Pt is on blood thinners Eliquis Hold 2 days  Pt denies issues with constipation   Pt is not on dialysis Hx of Afib stopped issue after MI in 2023 per pt Hx of MI 08/06/21  Pt denies any upcoming cardiac testing  Pt encouraged to use to use Singlecare or Goodrx to reduce cost   Patient's chart reviewed by Cathlyn Parsons CNRA prior to pre-visit and patient appropriate for the [facility].  Pre-visit completed and red dot placed by patient's name on their procedure day (on provider's schedule).  .  Visit by phone  Pt states weight is 170 lb  Instructed pt why it is important to and  to call if they have any changes in health or new medications. Directed them to the # given and on instructions.     Instructions reviewed. Pt given both [facility] main # and MD on call # prior to instructions.  Pt states understanding. Instructed to review again prior to procedure. Pt states they will.   Instructions sent by mail with coupon and by My Chart  Instructions and coupon given to pt   Instructions sent through My Chart  Coupon sent via text to mobile phone and pt verified they received it

## 2023-04-13 ENCOUNTER — Telehealth (HOSPITAL_COMMUNITY): Payer: Self-pay | Admitting: Cardiology

## 2023-04-13 NOTE — Telephone Encounter (Signed)
Triage walk in  Pt request eliquis samples  Medication Samples have been provided to the patient.  Drug name: eliquis       Strength: 5mg         Qty: 56  LOT: [id number]  Exp.Date: 04/2024  Dosing instructions: ONE TAB TWICE A DAY  The patient has been instructed regarding the correct time, dose, and frequency of taking this medication, including desired effects and most common side effects.   Magda Bernheim M 2:59 PM 04/13/2023

## 2023-05-04 ENCOUNTER — Encounter: Payer: Self-pay | Admitting: Gastroenterology

## 2023-05-05 ENCOUNTER — Encounter: Payer: Self-pay | Admitting: Gastroenterology

## 2023-05-05 ENCOUNTER — Other Ambulatory Visit (INDEPENDENT_AMBULATORY_CARE_PROVIDER_SITE_OTHER): Payer: Medicare Other

## 2023-05-05 ENCOUNTER — Ambulatory Visit: Payer: Medicare Other | Admitting: Gastroenterology

## 2023-05-05 ENCOUNTER — Encounter (HOSPITAL_COMMUNITY): Payer: Medicare Other | Admitting: Internal Medicine

## 2023-05-05 VITALS — BP 115/55 | HR 57 | Temp 97.2°F | Resp 11 | Ht 66.0 in | Wt 170.0 lb

## 2023-05-05 DIAGNOSIS — K640 First degree hemorrhoids: Secondary | ICD-10-CM

## 2023-05-05 DIAGNOSIS — Z1211 Encounter for screening for malignant neoplasm of colon: Secondary | ICD-10-CM

## 2023-05-05 DIAGNOSIS — Z8711 Personal history of peptic ulcer disease: Secondary | ICD-10-CM | POA: Insufficient documentation

## 2023-05-05 DIAGNOSIS — I509 Heart failure, unspecified: Secondary | ICD-10-CM | POA: Diagnosis not present

## 2023-05-05 DIAGNOSIS — Z8719 Personal history of other diseases of the digestive system: Secondary | ICD-10-CM

## 2023-05-05 DIAGNOSIS — I10 Essential (primary) hypertension: Secondary | ICD-10-CM | POA: Diagnosis not present

## 2023-05-05 DIAGNOSIS — I4892 Unspecified atrial flutter: Secondary | ICD-10-CM | POA: Diagnosis not present

## 2023-05-05 DIAGNOSIS — I2510 Atherosclerotic heart disease of native coronary artery without angina pectoris: Secondary | ICD-10-CM | POA: Diagnosis not present

## 2023-05-05 DIAGNOSIS — K5730 Diverticulosis of large intestine without perforation or abscess without bleeding: Secondary | ICD-10-CM

## 2023-05-05 DIAGNOSIS — K2940 Chronic atrophic gastritis without bleeding: Secondary | ICD-10-CM

## 2023-05-05 DIAGNOSIS — I4891 Unspecified atrial fibrillation: Secondary | ICD-10-CM | POA: Diagnosis not present

## 2023-05-05 LAB — CBC WITH DIFFERENTIAL/PLATELET
Basophils Absolute: 0.1 10*3/uL (ref 0.0–0.1)
Basophils Relative: 1.1 % (ref 0.0–3.0)
Eosinophils Absolute: 0.3 10*3/uL (ref 0.0–0.7)
Eosinophils Relative: 3.1 % (ref 0.0–5.0)
HCT: 31.5 % — ABNORMAL LOW (ref 39.0–52.0)
Hemoglobin: 10.8 g/dL — ABNORMAL LOW (ref 13.0–17.0)
Lymphocytes Relative: 15.8 % (ref 12.0–46.0)
Lymphs Abs: 1.4 10*3/uL (ref 0.7–4.0)
MCHC: 34.2 g/dL (ref 30.0–36.0)
MCV: 92 fL (ref 78.0–100.0)
Monocytes Absolute: 0.6 10*3/uL (ref 0.1–1.0)
Monocytes Relative: 7.4 % (ref 3.0–12.0)
Neutro Abs: 6.3 10*3/uL (ref 1.4–7.7)
Neutrophils Relative %: 72.6 % (ref 43.0–77.0)
Platelets: 238 10*3/uL (ref 150.0–400.0)
RBC: 3.42 Mil/uL — ABNORMAL LOW (ref 4.22–5.81)
RDW: 14.1 % (ref 11.5–15.5)
WBC: 8.7 10*3/uL (ref 4.0–10.5)

## 2023-05-05 MED ORDER — SODIUM CHLORIDE 0.9 % IV SOLN: 500.0000 mL | Freq: Once | INTRAVENOUS | Status: AC

## 2023-05-05 NOTE — Op Note (Signed)
 [facility] Patient Name: Antonio Cervantes Procedure Date: 05/05/2023 2:10 PM MRN: [id number] Endoscopist: Lynnie Bring , MD, [id number] Age: 68 Referring MD:  Date of Birth: 1955/07/16 Gender: Male Account #: [id number] Procedure:                Colonoscopy Indications:              H/O Melena (resolved) Medicines:                Monitored Anesthesia Care Procedure:                Pre-Anesthesia Assessment:                           - Prior to the procedure, a History and Physical                            was performed, and patient medications and                            allergies were reviewed. The patient's tolerance of                            previous anesthesia was also reviewed. The risks                            and benefits of the procedure and the sedation                            options and risks were discussed with the patient.                            All questions were answered, and informed consent                            was obtained. Prior Anticoagulants: The patient has                            taken Eliquis  (apixaban ), last dose was 2 days                            prior to procedure. ASA Grade Assessment: III - A                            patient with severe systemic disease. After                            reviewing the risks and benefits, the patient was                            deemed in satisfactory condition to undergo the                            procedure.  After obtaining informed consent, the colonoscope                            was passed under direct vision. Throughout the                            procedure, the patient's blood pressure, pulse, and                            oxygen saturations were monitored continuously. The                            Olympus CF-[id number] ([id number]) Colonoscope was                            introduced through the anus and advanced to the 1                             cm into the ileum. The colonoscopy was performed                            without difficulty. The patient tolerated the                            procedure well. The quality of the bowel                            preparation was good. The ileocecal valve,                            appendiceal orifice, and rectum were photographed. Scope In: 2:26:08 PM Scope Out: 2:38:59 PM Scope Withdrawal Time: 0 hours 7 minutes 39 seconds  Total Procedure Duration: 0 hours 12 minutes 51 seconds  Findings:                 Rare small-mouthed diverticula were found in the                            sigmoid colon.                           Non-bleeding internal hemorrhoids were found during                            retroflexion and during perianal exam. The                            hemorrhoids were Grade I (internal hemorrhoids that                            do not prolapse).                           The terminal ileum appeared normal.  The exam was otherwise without abnormality on                            direct and retroflexion views. Complications:            No immediate complications. Estimated Blood Loss:     Estimated blood loss: none. Impression:               - Minimal sigmoid diverticulosis.                           - Non-bleeding internal hemorrhoids.                           - The examined portion of the ileum was normal.                           - The examination was otherwise normal on direct                            and retroflexion views.                           - No specimens collected. Recommendation:           - Patient has a contact number available for                            emergencies. The signs and symptoms of potential                            delayed complications were discussed with the                            patient. Return to normal activities tomorrow.                            Written discharge instructions were  provided to the                            patient.                           - Resume previous diet.                           - Continue present medications.                           - Repeat colonoscopy is not recommended due to                            current age (43 years or older) for screening                            purposes. Hence repeat colonoscopy only if with any  new problems.                           - Check CBC today.                           - The findings and recommendations were discussed                            with the patient's family.                           - Resume Eliquis  (apixaban ) at prior dose tomorrow. Lynnie Bring, MD 05/05/2023 2:49:03 PM This report has been signed electronically.

## 2023-05-05 NOTE — Progress Notes (Signed)
 Report to PACU, RN, vss, BBS= Clear.

## 2023-05-05 NOTE — Op Note (Signed)
 [facility] Patient Name: Antonio Cervantes Procedure Date: 05/05/2023 2:13 PM MRN: [id number] Endoscopist: Lynnie Bring , MD, [id number] Age: 68 Referring MD:  Date of Birth: 10-Aug-1955 Gender: Male Account #: [id number] Procedure:                Upper GI endoscopy Indications:              FU gastric ulcers. Medicines:                Monitored Anesthesia Care Procedure:                Pre-Anesthesia Assessment:                           - Prior to the procedure, a History and Physical                            was performed, and patient medications and                            allergies were reviewed. The patient's tolerance of                            previous anesthesia was also reviewed. The risks                            and benefits of the procedure and the sedation                            options and risks were discussed with the patient.                            All questions were answered, and informed consent                            was obtained. Prior Anticoagulants: Eliquis  was                            held 2 days prior. ASA Grade Assessment: III - A                            patient with severe systemic disease. After                            reviewing the risks and benefits, the patient was                            deemed in satisfactory condition to undergo the                            procedure.                           After obtaining informed consent, the endoscope was  passed under direct vision. Throughout the                            procedure, the patient's blood pressure, pulse, and                            oxygen saturations were monitored continuously. The                            Olympus Scope [id number] was introduced through the                            mouth, and advanced to the second part of duodenum.                            The upper GI endoscopy was accomplished without                             difficulty. The patient tolerated the procedure                            well. Scope In: Scope Out: Findings:                 The examined esophagus was normal with well-defined                            Z-line at 40 cm.                           Diffuse mild inflammation characterized by erythema                            was found in the entire examined stomach. The                            ulcers had completely healed. There was antral                            scarring. Biopsies were taken with a cold forceps                            for histology.                           The examined duodenum was normal except for wide                            open D1/D2 junction stricture. The scope could                            easily passed beyond. Complications:            No immediate complications. Estimated Blood Loss:     Estimated blood loss: none. Impression:               -  Atrophic gastritis. Biopsied.                           - Complete healing of gastric ulcers. Recommendation:           - Patient has a contact number available for                            emergencies. The signs and symptoms of potential                            delayed complications were discussed with the                            patient. Return to normal activities tomorrow.                            Written discharge instructions were provided to the                            patient.                           - Resume previous diet.                           - Continue present medications. Reduce pantoprazole                             to 40 mg p.o. once a day                           - Await pathology results.                           - Proceed with colonoscopy.                           - The findings and recommendations were discussed                            with the patient's family. Lynnie Bring, MD 05/05/2023 2:45:48 PM This report has been signed  electronically.

## 2023-05-05 NOTE — Patient Instructions (Signed)
 Discharge instructions given. Handouts on Diverticulosis and Hemorrhoids. Gastritis handout. Discharge to lab after recovery. Resume previous medications. Resume Eliquis  at prior dose tomorrow. Reduce Pantoprazole  40mg  to once daily. YOU HAD AN ENDOSCOPIC PROCEDURE TODAY AT THE [facility]:   Refer to the procedure report that was given to you for any specific questions about what was found during the examination.  If the procedure report does not answer your questions, please call your gastroenterologist to clarify.  If you requested that your care partner not be given the details of your procedure findings, then the procedure report has been included in a sealed envelope for you to review at your convenience later.  YOU SHOULD EXPECT: Some feelings of bloating in the abdomen. Passage of more gas than usual.  Walking can help get rid of the air that was put into your GI tract during the procedure and reduce the bloating. If you had a lower endoscopy (such as a colonoscopy or flexible sigmoidoscopy) you may notice spotting of blood in your stool or on the toilet paper. If you underwent a bowel prep for your procedure, you may not have a normal bowel movement for a few days.  Please Note:  You might notice some irritation and congestion in your nose or some drainage.  This is from the oxygen used during your procedure.  There is no need for concern and it should clear up in a day or so.  SYMPTOMS TO REPORT IMMEDIATELY:  Following lower endoscopy (colonoscopy or flexible sigmoidoscopy):  Excessive amounts of blood in the stool  Significant tenderness or worsening of abdominal pains  Swelling of the abdomen that is new, acute  Fever of 100F or higher  Following upper endoscopy (EGD)  Vomiting of blood or coffee ground material  New chest pain or pain under the shoulder blades  Painful or persistently difficult swallowing  New shortness of breath  Fever of 100F or higher  Black,  tarry-looking stools  For urgent or emergent issues, a gastroenterologist can be reached at any hour by calling [phone]. Do not use MyChart messaging for urgent concerns.    DIET:  We do recommend a small meal at first, but then you may proceed to your regular diet.  Drink plenty of fluids but you should avoid alcoholic beverages for 24 hours.  ACTIVITY:  You should plan to take it easy for the rest of today and you should NOT DRIVE or use heavy machinery until tomorrow (because of the sedation medicines used during the test).    FOLLOW UP: Our staff will call the number listed on your records the next business day following your procedure.  We will call around 7:15- 8:00 am to check on you and address any questions or concerns that you may have regarding the information given to you following your procedure. If we do not reach you, we will leave a message.     If any biopsies were taken you will be contacted by phone or by letter within the next 1-3 weeks.  Please call us  at [phone] if you have not heard about the biopsies in 3 weeks.    SIGNATURES/CONFIDENTIALITY: You and/or your care partner have signed paperwork which will be entered into your electronic medical record.  These signatures attest to the fact that that the information above on your After Visit Summary has been reviewed and is understood.  Full responsibility of the confidentiality of this discharge information lies with you and/or your care-partner.

## 2023-05-05 NOTE — Progress Notes (Signed)
 Called to room to assist during endoscopic procedure.  Patient ID and intended procedure confirmed with present staff. Received instructions for my participation in the procedure from the performing physician.

## 2023-05-05 NOTE — Progress Notes (Signed)
 [facility] History and Physical   Primary Care Physician:  [name], FNP   Reason for Procedure:   FU gastric ulcers/history of melena-Limited colonoscopy previously due to quality of preparation  Plan:    For EGD/colonoscopy Held Eliquis  a day before     HPI: Antonio Cervantes is a 68 y.o. male    Past Medical History:  Diagnosis Date   Atrial fibrillation and flutter (HCC)    Blood transfusion without reported diagnosis    Cardiac arrhythmia due to congenital heart disease    Cataract    CHF (congestive heart failure) (HCC)    CKD (chronic kidney disease)    Diabetes mellitus without complication (HCC)    Diabetic peripheral neuropathy associated with type 2 diabetes mellitus (HCC)    Heart failure (HCC)    High blood cholesterol    Hypertension    Myocardial infarction (HCC)    08/06/2021   Thyroid  disease     Past Surgical History:  Procedure Laterality Date   AORTIC VALVE REPLACEMENT N/A 07/29/2021   Procedure: AORTIC VALVE REPLACEMENT (AVR) USING EDWARDS INSPIRIS AORTIC VALVE SIZE ;  Surgeon: Lucas Dorise POUR, MD;  Location: [facility] OR;  Service: Open Heart Surgery;  Laterality: N/A;   BIOPSY  12/30/2022   Procedure: BIOPSY;  Surgeon: Leigh Elspeth SQUIBB, MD;  Location: [facility] ENDOSCOPY;  Service: Gastroenterology;;   COLONOSCOPY N/A 12/30/2022   Procedure: COLONOSCOPY;  Surgeon: Leigh Elspeth SQUIBB, MD;  Location: [facility] ENDOSCOPY;  Service: Gastroenterology;  Laterality: N/A;   CORONARY ARTERY BYPASS GRAFT N/A 07/29/2021   Procedure: CORONARY ARTERY BYPASS GRAFTING (CABG) TIMES FOUR, USING LEFT INTERNAL MAMMARY ARTERY AND RIGHT GREATER SAPHENOUS VEIN HARVESTED ENDOSCOPICALLYY.;  Surgeon: Lucas Dorise POUR, MD;  Location: [facility] OR;  Service: Open Heart Surgery;  Laterality: N/A;   ESOPHAGOGASTRODUODENOSCOPY N/A 12/30/2022   Procedure: ESOPHAGOGASTRODUODENOSCOPY (EGD);  Surgeon: Leigh Elspeth SQUIBB, MD;  Location: [facility] ENDOSCOPY;  Service:  Gastroenterology;  Laterality: N/A;   fatty tumar removal     On back   HEMOSTASIS CLIP PLACEMENT  12/30/2022   Procedure: HEMOSTASIS CLIP PLACEMENT;  Surgeon: Leigh Elspeth SQUIBB, MD;  Location: [facility] ENDOSCOPY;  Service: Gastroenterology;;   RIGHT/LEFT HEART CATH AND CORONARY ANGIOGRAPHY N/A 05/21/2021   Procedure: RIGHT/LEFT HEART CATH AND CORONARY ANGIOGRAPHY;  Surgeon: Dann Candyce RAMAN, MD;  Location: [facility] INVASIVE CV LAB;  Service: Cardiovascular;  Laterality: N/A;   TEE WITHOUT CARDIOVERSION N/A 07/29/2021   Procedure: TRANSESOPHAGEAL ECHOCARDIOGRAM (TEE);  Surgeon: Lucas Dorise POUR, MD;  Location: [facility] OR;  Service: Open Heart Surgery;  Laterality: N/A;    Prior to Admission medications   Medication Sig Start Date End Date Taking? Authorizing Provider  aspirin  EC 81 MG tablet Take 1 tablet (81 mg total) by mouth daily. Swallow whole. 01/04/23  Yes Ghimire, Donalda HERO, MD  atorvastatin  (LIPITOR ) 80 MG tablet Take 1 tablet (80 mg total) by mouth daily. 05/24/21  Yes Jillian Buttery, MD  dapagliflozin  propanediol (FARXIGA ) 10 MG TABS tablet Take 1 tablet (10 mg total) by mouth daily before breakfast. 09/05/22  Yes Milford, Harlene HERO, FNP  furosemide  (LASIX ) 20 MG tablet Take 1 tablet by mouth on Monday Wednesday and Friday 01/08/23  Yes Colletta Manuelita Garre, PA-C  glipiZIDE (GLUCOTROL) 5 MG tablet Take 5 mg by mouth daily. 09/01/22  Yes [provider]  indomethacin  (INDOCIN ) 25 MG capsule Take 25 mg by mouth daily as needed for mild pain or moderate pain.   Yes [provider]  levothyroxine  (SYNTHROID ) 112  MCG tablet Take 112 mcg by mouth daily. 02/22/22  Yes [provider]  losartan  (COZAAR ) 25 MG tablet  10/11/22  Yes [provider]  metoprolol  succinate (TOPROL -XL) 25 MG 24 hr tablet Take 1 tablet (25 mg total) by mouth daily. 09/05/22  Yes Milford, Harlene HERO, FNP  pantoprazole  (PROTONIX ) 40 MG tablet Take twice daily for 4 weeks, and then once daily  thereafter. Patient taking differently: 40 mg daily. Take twice daily for 4 weeks, and then once daily thereafter. 01/01/23  Yes Ghimire, Donalda HERO, MD  apixaban  (ELIQUIS ) 5 MG TABS tablet Take 1 tablet (5 mg total) by mouth 2 (two) times daily. 01/04/23   Ghimire, Donalda HERO, MD    Current Outpatient Medications  Medication Sig Dispense Refill   aspirin  EC 81 MG tablet Take 1 tablet (81 mg total) by mouth daily. Swallow whole.     atorvastatin  (LIPITOR ) 80 MG tablet Take 1 tablet (80 mg total) by mouth daily. 30 tablet 0   dapagliflozin  propanediol (FARXIGA ) 10 MG TABS tablet Take 1 tablet (10 mg total) by mouth daily before breakfast. 90 tablet 3   furosemide  (LASIX ) 20 MG tablet Take 1 tablet by mouth on Monday Wednesday and Friday 45 tablet 3   glipiZIDE (GLUCOTROL) 5 MG tablet Take 5 mg by mouth daily.     indomethacin  (INDOCIN ) 25 MG capsule Take 25 mg by mouth daily as needed for mild pain or moderate pain.     levothyroxine  (SYNTHROID ) 112 MCG tablet Take 112 mcg by mouth daily.     losartan  (COZAAR ) 25 MG tablet      metoprolol  succinate (TOPROL -XL) 25 MG 24 hr tablet Take 1 tablet (25 mg total) by mouth daily. 90 tablet 3   pantoprazole  (PROTONIX ) 40 MG tablet Take twice daily for 4 weeks, and then once daily thereafter. (Patient taking differently: 40 mg daily. Take twice daily for 4 weeks, and then once daily thereafter.) 90 tablet 1   apixaban  (ELIQUIS ) 5 MG TABS tablet Take 1 tablet (5 mg total) by mouth 2 (two) times daily.     Current Facility-Administered Medications  Medication Dose Route Frequency Provider Last Rate Last Admin   0.9 %  sodium chloride  infusion  500 mL Intravenous Once Charlanne Groom, MD        Allergies as of 05/05/2023   (No Known Allergies)    Family History  Problem Relation Age of Onset   Diabetes Mother    Diabetes Father    Throat cancer Brother    Heart attack Maternal Uncle    Sudden Cardiac Death Neg Hx    Liver disease Neg Hx    Colon cancer  Neg Hx    Colon polyps Neg Hx    Esophageal cancer Neg Hx    Stomach cancer Neg Hx    Rectal cancer Neg Hx     Social History   Socioeconomic History   Marital status: Married    Spouse name: Not on file   Number of children: 1   Years of education: Not on file   Highest education level: 10th grade  Occupational History   Occupation: retired  Tobacco Use   Smoking status: Former    Current packs/day: 0.00    Average packs/day: 1.5 packs/day for 15.0 years (22.5 ttl pk-yrs)    Types: Cigarettes    Start date: 2    Quit date: 1983    Years since quitting: 42.0   Smokeless tobacco: Never  Vaping Use  Vaping status: Never Used  Substance and Sexual Activity   Alcohol use: No   Drug use: No   Sexual activity: Not on file  Other Topics Concern   Not on file  Social History Narrative   Not on file   Social Drivers of Health   Financial Resource Strain: Low Risk  (05/21/2021)   Overall Financial Resource Strain (CARDIA)    Difficulty of Paying Living Expenses: Not very hard  Food Insecurity: No Food Insecurity (12/28/2022)   Hunger Vital Sign    Worried About Running Out of Food in the Last Year: Never true    Ran Out of Food in the Last Year: Never true  Transportation Needs: No Transportation Needs (12/28/2022)   PRAPARE - Administrator, Civil Service (Medical): No    Lack of Transportation (Non-Medical): No  Physical Activity: Not on file  Stress: Not on file  Social Connections: Not on file  Intimate Partner Violence: Not At Risk (12/28/2022)   Humiliation, Afraid, Rape, and Kick questionnaire    Fear of Current or Ex-Partner: No    Emotionally Abused: No    Physically Abused: No    Sexually Abused: No    Review of Systems: Positive for none All other review of systems negative except as mentioned in the HPI.  Physical Exam: Vital signs in last 24 hours: @VSRANGES @   General:   Alert,  Well-developed, well-nourished, pleasant and cooperative  in NAD Lungs:  Clear throughout to auscultation.   Heart:  Regular rate and rhythm; no murmurs, clicks, rubs,  or gallops. Abdomen:  Soft, nontender and nondistended. Normal bowel sounds.   Neuro/Psych:  Alert and cooperative. Normal mood and affect. A and O x 3    No significant changes were identified.  The patient continues to be an appropriate candidate for the planned procedure and anesthesia.   Anselm Bring, MD. [facility] 05/05/2023 4:25 PM@

## 2023-05-06 ENCOUNTER — Telehealth: Payer: Self-pay

## 2023-05-06 DIAGNOSIS — N182 Chronic kidney disease, stage 2 (mild): Secondary | ICD-10-CM | POA: Diagnosis not present

## 2023-05-06 DIAGNOSIS — E1122 Type 2 diabetes mellitus with diabetic chronic kidney disease: Secondary | ICD-10-CM | POA: Diagnosis not present

## 2023-05-06 DIAGNOSIS — E782 Mixed hyperlipidemia: Secondary | ICD-10-CM | POA: Diagnosis not present

## 2023-05-06 DIAGNOSIS — R09A2 Foreign body sensation, throat: Secondary | ICD-10-CM | POA: Diagnosis not present

## 2023-05-06 DIAGNOSIS — I10 Essential (primary) hypertension: Secondary | ICD-10-CM | POA: Diagnosis not present

## 2023-05-06 DIAGNOSIS — E039 Hypothyroidism, unspecified: Secondary | ICD-10-CM | POA: Diagnosis not present

## 2023-05-06 DIAGNOSIS — Z800 Family history of malignant neoplasm of digestive organs: Secondary | ICD-10-CM | POA: Diagnosis not present

## 2023-05-06 DIAGNOSIS — Z87891 Personal history of nicotine dependence: Secondary | ICD-10-CM | POA: Diagnosis not present

## 2023-05-06 DIAGNOSIS — I480 Paroxysmal atrial fibrillation: Secondary | ICD-10-CM | POA: Diagnosis not present

## 2023-05-06 DIAGNOSIS — I5020 Unspecified systolic (congestive) heart failure: Secondary | ICD-10-CM | POA: Diagnosis not present

## 2023-05-06 DIAGNOSIS — R040 Epistaxis: Secondary | ICD-10-CM | POA: Diagnosis not present

## 2023-05-06 NOTE — Telephone Encounter (Signed)
 Left message on follow up call.

## 2023-05-07 NOTE — Telephone Encounter (Signed)
 Patient returned call states everything is fine.

## 2023-05-11 ENCOUNTER — Other Ambulatory Visit: Payer: Self-pay

## 2023-05-11 DIAGNOSIS — D649 Anemia, unspecified: Secondary | ICD-10-CM

## 2023-05-11 DIAGNOSIS — K259 Gastric ulcer, unspecified as acute or chronic, without hemorrhage or perforation: Secondary | ICD-10-CM

## 2023-05-11 DIAGNOSIS — K922 Gastrointestinal hemorrhage, unspecified: Secondary | ICD-10-CM

## 2023-05-11 LAB — SURGICAL PATHOLOGY

## 2023-05-12 ENCOUNTER — Telehealth: Payer: Self-pay | Admitting: Gastroenterology

## 2023-05-12 NOTE — Telephone Encounter (Signed)
 They have been made aware. Spoke to [place]. Told her to come in 1 month here to redo lab work

## 2023-05-12 NOTE — Telephone Encounter (Signed)
 Left message for patient's daughter in law, Harlene (ok per DPR) to call back. See lab result notes from 05/05/23...  Lynnie Bring, MD 05/10/2023 11:46 AM EST     Please inform the patient. All results normal or at baseline. Hemoglobin 10.8 Start iron supplements iron sulfate 1 tablet p.o. daily Recheck CBC in 4 weeks Send report to family physician

## 2023-05-12 NOTE — Telephone Encounter (Signed)
 Patient daughter in law called and stated that they received a call about her father in law lab results. Patient daughter in law requested a call back. Please advise.

## 2023-05-13 ENCOUNTER — Encounter: Payer: Self-pay | Admitting: Gastroenterology

## 2023-06-04 ENCOUNTER — Telehealth (HOSPITAL_COMMUNITY): Payer: Self-pay | Admitting: Internal Medicine

## 2023-06-04 NOTE — Telephone Encounter (Signed)
 Called patient at [phone] and left message in regards to his appointment with Dr. Cherrie on Friday, 06/05/23 at 11:40 AM.   [place] office was unable to speak to patient / unable to reach patient - - left voice message to remind patient of his appointment tomorrow 06/05/23 with Dr. Bensimhon at 11:40 AM.

## 2023-06-05 ENCOUNTER — Ambulatory Visit (HOSPITAL_COMMUNITY)
Admission: RE | Admit: 2023-06-05 | Discharge: 2023-06-05 | Disposition: A | Discharge status: Home / Self Care | Payer: Medicare Other | Source: Ambulatory Visit | Attending: Internal Medicine | Admitting: Internal Medicine

## 2023-06-05 ENCOUNTER — Other Ambulatory Visit (HOSPITAL_COMMUNITY): Payer: Self-pay

## 2023-06-05 ENCOUNTER — Encounter (HOSPITAL_COMMUNITY): Payer: Self-pay | Admitting: Internal Medicine

## 2023-06-05 VITALS — BP 104/70 | HR 71 | Wt 176.4 lb

## 2023-06-05 DIAGNOSIS — N1832 Chronic kidney disease, stage 3b: Secondary | ICD-10-CM | POA: Insufficient documentation

## 2023-06-05 DIAGNOSIS — I5032 Chronic diastolic (congestive) heart failure: Secondary | ICD-10-CM | POA: Diagnosis not present

## 2023-06-05 DIAGNOSIS — I350 Nonrheumatic aortic (valve) stenosis: Secondary | ICD-10-CM | POA: Insufficient documentation

## 2023-06-05 DIAGNOSIS — N183 Chronic kidney disease, stage 3 unspecified: Secondary | ICD-10-CM | POA: Diagnosis not present

## 2023-06-05 DIAGNOSIS — E875 Hyperkalemia: Secondary | ICD-10-CM | POA: Insufficient documentation

## 2023-06-05 DIAGNOSIS — I5042 Chronic combined systolic (congestive) and diastolic (congestive) heart failure: Secondary | ICD-10-CM | POA: Insufficient documentation

## 2023-06-05 DIAGNOSIS — K259 Gastric ulcer, unspecified as acute or chronic, without hemorrhage or perforation: Secondary | ICD-10-CM | POA: Insufficient documentation

## 2023-06-05 DIAGNOSIS — Z7901 Long term (current) use of anticoagulants: Secondary | ICD-10-CM | POA: Insufficient documentation

## 2023-06-05 DIAGNOSIS — I2510 Atherosclerotic heart disease of native coronary artery without angina pectoris: Secondary | ICD-10-CM | POA: Diagnosis not present

## 2023-06-05 DIAGNOSIS — Z79899 Other long term (current) drug therapy: Secondary | ICD-10-CM | POA: Diagnosis not present

## 2023-06-05 DIAGNOSIS — I255 Ischemic cardiomyopathy: Secondary | ICD-10-CM | POA: Insufficient documentation

## 2023-06-05 DIAGNOSIS — I252 Old myocardial infarction: Secondary | ICD-10-CM | POA: Insufficient documentation

## 2023-06-05 DIAGNOSIS — D62 Acute posthemorrhagic anemia: Secondary | ICD-10-CM | POA: Insufficient documentation

## 2023-06-05 DIAGNOSIS — I130 Hypertensive heart and chronic kidney disease with heart failure and stage 1 through stage 4 chronic kidney disease, or unspecified chronic kidney disease: Secondary | ICD-10-CM | POA: Diagnosis not present

## 2023-06-05 DIAGNOSIS — E1122 Type 2 diabetes mellitus with diabetic chronic kidney disease: Secondary | ICD-10-CM | POA: Diagnosis not present

## 2023-06-05 DIAGNOSIS — I4819 Other persistent atrial fibrillation: Secondary | ICD-10-CM | POA: Diagnosis not present

## 2023-06-05 DIAGNOSIS — Z951 Presence of aortocoronary bypass graft: Secondary | ICD-10-CM | POA: Diagnosis not present

## 2023-06-05 DIAGNOSIS — Z952 Presence of prosthetic heart valve: Secondary | ICD-10-CM | POA: Diagnosis not present

## 2023-06-05 MED ORDER — NITROGLYCERIN 0.4 MG SL SUBL: 0.4000 mg | SUBLINGUAL_TABLET | SUBLINGUAL | 3 refills | Status: AC | PRN

## 2023-06-05 NOTE — Addendum Note (Signed)
 Encounter addended by: Glorietta Lark, RN on: 06/05/2023 12:31 PM  Actions taken: Clinical Note Signed

## 2023-06-05 NOTE — Patient Instructions (Signed)
 Great to see you today!!!  Your physician recommends that you schedule a follow-up appointment in: 9 months  Do the following things EVERYDAY: Weigh yourself in the morning before breakfast. Write it down and keep it in a log. Take your medicines as prescribed Eat low salt foods--Limit salt (sodium) to 2000 mg per day.  Stay as active as you can everyday Limit all fluids for the day to less than 2 liters  If you have any questions or concerns before your next appointment please send us  a message through [place] or call our office at [phone].    TO LEAVE A MESSAGE FOR THE NURSE SELECT OPTION 2, PLEASE LEAVE A MESSAGE INCLUDING: YOUR NAME DATE OF BIRTH CALL BACK NUMBER REASON FOR CALL**this is important as we prioritize the call backs  YOU WILL RECEIVE A CALL BACK THE SAME DAY AS LONG AS YOU CALL BEFORE 4:00 PM   At the [facility], you and your health needs are our priority. As part of our continuing mission to provide you with exceptional heart care, we have created designated Provider Care Teams. These Care Teams include your primary Cardiologist (physician) and Advanced Practice Providers (APPs- Physician Assistants and Nurse Practitioners) who all work together to provide you with the care you need, when you need it.   You may see any of the following providers on your designated Care Team at your next follow up: Dr Toribio Fuel Dr Ezra Shuck Dr. Ria Commander Dr. Morene Brownie Amy Lenetta, NP Caffie Shed, [place] [facility] [place], [place] Beckey Coe, NP Jordan Lee, NP Tinnie Redman, PharmD   Please be sure to bring in all your medications bottles to every appointment.    Thank you for choosing [facility]

## 2023-06-05 NOTE — Progress Notes (Signed)
 ADVANCED HF CLINIC NOTE  PCP: Dwayne Ellen, FNP Primary Cardiologist: Ellen Dwayne NOVAK, FNP HF Cardiologist: Dr. Cherrie  HPI: Antonio Cervantes 68 y.o. male w/ T2DM, remote tobacco use, systolic heart failure/iCM, CAD, AF, and HLD.   Admitted 1/23 for NSTEMI and found to be in acute CHF and afib w/ RVR, of unknown duration but spontaneously converted back to NSR. Echo with severely reduced LVEF, 20-25%, RV normal. LHC showed severe MVCAD, however given diffuse, distal vessel disease, there were no good options for revascularization (Diffuse inoperable CAD, targets not amendable to PCI). RHC showed normal filling pressures and normal CO  5.4 L/min, cardiac index 2.88. Medical therapy for CAD was recommended. Placed on ASA, statin and ? blocker. GDMT limited by soft BP and renal insuffiency, SCr ~1.5 during hospitalization (baseline unknown). Eliquis  started for Afib. Referred to [facility] clinic.    Of note, there was also concern for potential aortic stenosis base on echo interpretation, degree of AS hard to judge due to severe decrease in LV function DVI 0.55 AVA 2 cm2 but morphology and gradients suggest AS could be moderate .   Seen in [facility] 2/23, stable NYHA I-II symptoms. GDMT titrated. Follow up in AHF 3/23 with stable NYHA II symptoms, euvolemic. Started on low-dose losartan .  Re-admitted 3/23 with NSTEMI. Underwent cMRI showing LVEF 47%, normal RVEF, viable multi-vessel disease. Moderate AI. CVTS consulted and underwent CABG x 4 and aortic valve replacement. Course complicated by persistent AF despite amiodarone  and started on Coumadin  (plan to continue for 3 months then switch to Eliquis ). Spiro, beta blocker, and Lasix  stopped with low BP and bradycardia. Discharged home, weight 163 lbs.  Echo 10/18/21 EF normalized, 55-60% G2DD AoV ok.   Follow up 1/24, doing well NYHA II.   Admitted 3/24 with a/c dHF. Diuresed with IV lasix , complicated by AKI on CKD. Had been taking NSAID for  gout. Echo showed EF 55-60%, RV ok. He was discharged home, weight 164 lbs.  Admitted 09/24 with severe anemia, weakness and dyspnea. Hgb down to 6.4 in setting of NSAID use for gout attack. Required transfusions. EGD revealed gastric ulcers with placement of one clip. Colonoscopy okay. Eliquis  and Aspirin  initially held but later restarted. He had elevated troponin up to 1420. Cardiology consulted. Echo with stable EF, 60-65%, RV okay, mean gradient of 12 mmhg across aortic valve prosthesis. Troponin elevation thought to be likely 2/2 demand ischemia in setting of acute blood loss anemia. No further ischemic workup recommended.   Here for f/u with his daughter-in-law. Says he is doing ok. Does yardwork and works in his shed. No CP or SOB. No bleeding. No problems with meds. Remains on Eliquis  SBP often in 90s. But not lightheaded much.    Cardiac Testing  - Echo (09/24): EF 60-65%, RV okay, mean gradient of 12 across aortic valve prosthesis   - Echo (3/24): EF 55-60%, RV ok, AoV ok  - Echo 6/23 EF 55-60%, GDDD, AoV ok   - cMRI (3/23): LVEF 47%, normal RVEF, mild to moderate AI, study suggests viable multi-vessel disease.  - Echo (3/23): EF 50%, mild LVH, RV ok, mild AI  - Echo (1/23): EF 20-25%, severe LV dysfunction, mild LVH, RV ok, mild MR, degree of AS hard to judge due to severe decrease in LV function DVI 0.55 AVA 2 cm2 but morphology and gradients suggest AS could be moderate .   - LHC/RHC (05/21/21):   Prox LAD to Mid LAD lesion is 75% stenosed.   Dist  LAD lesion is 80% stenosed.   1st Diag lesion is 80% stenosed.   Ost RCA to Mid RCA lesion is 99% stenosed.   2nd Mrg lesion is 99% stenosed.   Dist Cx lesion is 90% stenosed.   Prox Cx to Mid Cx lesion is 75% stenosed.   LV end diastolic pressure is normal.   There is no aortic valve stenosis.   Severe, multivessel coronary artery disease.   Normal LVEDP. Given diffuse, distal vessel disease, there are no good options for  revascularization.   Right heart pressures: RA mean 2 PA pressure 26/10 mean 17 PCWP mean 6 CO/CI 5.4/2.88 (Fick)  ROS: All systems reviewed and negative except as per HPI.   Past Medical History:  Diagnosis Date   Atrial fibrillation and flutter (HCC)    Blood transfusion without reported diagnosis    Cardiac arrhythmia due to congenital heart disease    Cataract    CHF (congestive heart failure) (HCC)    CKD (chronic kidney disease)    Diabetes mellitus without complication (HCC)    Diabetic peripheral neuropathy associated with type 2 diabetes mellitus (HCC)    Heart failure (HCC)    High blood cholesterol    Hypertension    Myocardial infarction (HCC)    08/06/2021   Thyroid  disease    Current Outpatient Medications  Medication Sig Dispense Refill   apixaban  (ELIQUIS ) 5 MG TABS tablet Take 1 tablet (5 mg total) by mouth 2 (two) times daily.     aspirin  EC 81 MG tablet Take 1 tablet (81 mg total) by mouth daily. Swallow whole.     atorvastatin  (LIPITOR ) 80 MG tablet Take 1 tablet (80 mg total) by mouth daily. 30 tablet 0   dapagliflozin  propanediol (FARXIGA ) 10 MG TABS tablet Take 1 tablet (10 mg total) by mouth daily before breakfast. 90 tablet 3   famotidine  (PEPCID ) 40 MG tablet Take 40 mg by mouth daily.     furosemide  (LASIX ) 20 MG tablet Take 1 tablet by mouth on Monday Wednesday and Friday 45 tablet 3   glipiZIDE (GLUCOTROL) 5 MG tablet Take 5 mg by mouth daily.     indomethacin  (INDOCIN ) 25 MG capsule Take 25 mg by mouth daily as needed for mild pain or moderate pain.     levothyroxine  (SYNTHROID ) 137 MCG tablet Take 137 mcg by mouth daily before breakfast.     metoprolol  succinate (TOPROL -XL) 25 MG 24 hr tablet Take 1 tablet (25 mg total) by mouth daily. 90 tablet 3   pantoprazole  (PROTONIX ) 40 MG tablet Take 40 mg by mouth daily.     Current Facility-Administered Medications  Medication Dose Route Frequency Provider Last Rate Last Admin   0.9 %  sodium chloride   infusion  500 mL Intravenous Once Charlanne Groom, MD       No Known Allergies  Social History   Socioeconomic History   Marital status: Married    Spouse name: Not on file   Number of children: 1   Years of education: Not on file   Highest education level: 10th grade  Occupational History   Occupation: retired  Tobacco Use   Smoking status: Former    Current packs/day: 0.00    Average packs/day: 1.5 packs/day for 15.0 years (22.5 ttl pk-yrs)    Types: Cigarettes    Start date: 33    Quit date: 1983    Years since quitting: 42.1   Smokeless tobacco: Never  Vaping Use   Vaping status: Never Used  Substance and Sexual Activity   Alcohol use: No   Drug use: No   Sexual activity: Not on file  Other Topics Concern   Not on file  Social History Narrative   Not on file   Social Drivers of Health   Financial Resource Strain: Low Risk  (05/21/2021)   Overall Financial Resource Strain (CARDIA)    Difficulty of Paying Living Expenses: Not very hard  Food Insecurity: No Food Insecurity (12/28/2022)   Hunger Vital Sign    Worried About Running Out of Food in the Last Year: Never true    Ran Out of Food in the Last Year: Never true  Transportation Needs: No Transportation Needs (12/28/2022)   PRAPARE - Administrator, Civil Service (Medical): No    Lack of Transportation (Non-Medical): No  Physical Activity: Not on file  Stress: Not on file  Social Connections: Not on file  Intimate Partner Violence: Not At Risk (12/28/2022)   Humiliation, Afraid, Rape, and Kick questionnaire    Fear of Current or Ex-Partner: No    Emotionally Abused: No    Physically Abused: No    Sexually Abused: No   Family History  Problem Relation Age of Onset   Diabetes Mother    Diabetes Father    Throat cancer Brother    Heart attack Maternal Uncle    Sudden Cardiac Death Neg Hx    Liver disease Neg Hx    Colon cancer Neg Hx    Colon polyps Neg Hx    Esophageal cancer Neg Hx     Stomach cancer Neg Hx    Rectal cancer Neg Hx    Wt Readings from Last 3 Encounters:  06/05/23 80 kg (176 lb 6.4 oz)  05/05/23 77.1 kg (170 lb)  04/06/23 77.1 kg (170 lb)   BP 104/70   Pulse 71   Wt 80 kg (176 lb 6.4 oz)   SpO2 99%   BMI 28.47 kg/m   PHYSICAL EXAM: General:  Well appearing. No resp difficulty HEENT: normal Neck: supple. no JVD. Carotids 2+ bilat; no bruits. No lymphadenopathy or thryomegaly appreciated. Cor: PMI nondisplaced. Regular rate & rhythm. No rubs, gallops or murmurs. Lungs: clear Abdomen: soft, nontender, nondistended. No hepatosplenomegaly. No bruits or masses. Good bowel sounds. Extremities: no cyanosis, clubbing, rash, edema Neuro: alert & orientedx3, cranial nerves grossly intact. moves all 4 extremities w/o difficulty. Affect pleasant   ECG (personally reviewed): SR 76 1AVB No ST-T wave abnormalities.    ASSESSMENT & PLAN:  1. Chronic Systolic Heart Failure>>Chronic Diastolic CHF - Ischemic CM.  - Echo 1/23 EF 20-25%, RV ok - LHC (1/23): w/ severe MVCAD. RHC w/ normal filling pressures and preserved CO 5.4 L/min, cardiac index 2.88 - Echo (3/23): improved EF 50%, mild LVH, RV ok, mild AI - cMRI (3/23): LVEF 47%, RVEF 48%, viable multi-vessel disease, moderate AI - s/p CABG 4/23 - Echo 6/23 EF  normalized, 55-60% G2DD Aov ok   - Echo (3/24): EF 55-60%, RV ok - Echo (09/24): EF 60-65%, RV okay - NYHA I-II Volume ok  - GDMT limited by hyperkalemia and CKD - Continue Farxiga  10 mg daily. - Continue Toprol  25  - Not on spiro with CKD and hyperkalemia. - Off losartan  with worsening renal function and hyperkalemia (failed x 2). Would no retry  - Recent Scr 2.33   2. CAD - NSTEMI 1/23, LHC w/ severe, diffuse MVCAD--> medically managed. - NSTEMI 3/23, s/p CABG x 4, LIMA to  LAD, SVG to diagonal, SVG to OM, SVG to distal RCA and AVR - No s/s angina - Continue ASA 81 + atorva 80 daily + beta blocker. - Goal LDL < 70  Managed by PCP    3. PAF - In SR today  - Continue Toprol . - Continue Eliquis  5 mg bid.   4. Aortic Stenosis  - s/p aortic valve replacement, stable on echo 09/24 - Aware of SBE prophylaxis  - repeat echo at next visit   5. CKD IIIb - Baseline Scr previously 1.5, averaging 2.1-2.4 since March.  - Scr up to 2.6 during recent admission - Recent labs reviewed personally Scr 2.33    6. Type 2DM - Hgb A1c 7.8 recently  - followed by PCP  - Continue SGLT2i   7. HLD  - Continue atorva 80 mg - Goal LDL < 70 - Followed by PCP   8. Remote Tobacco Use - Quit 40 years ago.  9. Acute blood loss anemia GI bleed - GI bleed 09/24 in setting of NSAID use. Had gastric ulcers, treated with 1 clip. Multiple transfusions. Colonoscopy okay.     Toribio Fuel, MD  12:21 PM

## 2023-06-05 NOTE — Addendum Note (Signed)
 Encounter addended by: Glorietta Lark, RN on: 06/05/2023 12:36 PM  Actions taken: Pharmacy for encounter modified, Order list changed

## 2023-06-09 ENCOUNTER — Other Ambulatory Visit (HOSPITAL_COMMUNITY): Payer: Self-pay

## 2023-06-09 ENCOUNTER — Telehealth (HOSPITAL_COMMUNITY): Payer: Self-pay

## 2023-06-09 NOTE — Telephone Encounter (Signed)
Advanced Heart Failure Patient Advocate Encounter  The patient was approved for a [employer] grant that will help cover the cost of Farxiga.  Total amount awarded, $10,000.  Effective: 05/10/2023 - 05/08/2024.  BIN [id number] PCN [id number] Group [id number] ID [id number]  Pharmacy provided with approval and processing information. Patient informed via voicemail.  Burnell Blanks, CPhT Rx Patient Advocate Phone: [phone]

## 2023-06-16 ENCOUNTER — Telehealth (HOSPITAL_COMMUNITY): Payer: Self-pay | Admitting: Pharmacy Technician

## 2023-06-16 NOTE — Telephone Encounter (Signed)
 Medication Samples have been provided to the patient.  Drug name: Eliquis       Strength: 5mg         Qty: 4 boxes  LOT: [id number]  Exp.Date: 09/2024  Dosing instructions: Take 1 tablet by mouth twice daily.   The patient has been instructed regarding the correct time, dose, and frequency of taking this medication, including desired effects and most common side effects.   Allen Kell [facility] 3:56 PM 06/16/2023

## 2023-06-29 ENCOUNTER — Ambulatory Visit: Payer: Medicare Other | Attending: Cardiovascular Disease | Admitting: Cardiovascular Disease

## 2023-06-29 NOTE — Progress Notes (Deleted)
 No chief complaint on file.  History of Present Illness: 68 yo male with history of DM, chronic systolic CHF, ischemic cardiomyopathy, CAD s/p 4V CABG, aortic stenosis s/p AVR, atrial fibrillation/flutter and hyperlipidemia who is here today for follow up. He has been followed in our office by Dr. Eldridge Dace. He was admitted to [facility] in January 2023 with dyspnea and was found to be acute heart failure with atrial fib with RVR, elevated troponin c/w NSTEMI. He spontaneously converted to sinus. Echo with LVEF=20-25%. Moderate aortic stenosis. Cardiac cath with diffuse multi-vessel CAD with diffuse distal vessel disease. At that time, he was not felt to have good targets for CABG. He was managed medically. Right heart cath with normal pressures. He was placed  on ASA, Eliquis, statin and a beta blocker. GDMT limited by soft BP and renal insufficiency. He was admitted again in march 2023 with a NSTEMI. Cardiac MRi with LVEF=47%. He underwent 4V CABG and AVR. Post-op course complicated by atrial fib. Echo June 2023 with LVEF=55-60%. AVR working well. Last seen in our office by Dr. Eldridge Dace March 2024 and doing well.   He is here today for follow up. The patient denies any chest pain, dyspnea, palpitations, lower extremity edema, orthopnea, PND, dizziness, near syncope or syncope.   Primary Care Physician: Ellis Parents, FNP   Past Medical History:  Diagnosis Date   Atrial fibrillation and flutter (HCC)    Blood transfusion without reported diagnosis    Cardiac arrhythmia due to congenital heart disease    Cataract    CHF (congestive heart failure) (HCC)    CKD (chronic kidney disease)    Diabetes mellitus without complication (HCC)    Diabetic peripheral neuropathy associated with type 2 diabetes mellitus (HCC)    Heart failure (HCC)    High blood cholesterol    Hypertension    Myocardial infarction (HCC)    08/06/2021   Thyroid disease     Past Surgical History:  Procedure Laterality  Date   AORTIC VALVE REPLACEMENT N/A 07/29/2021   Procedure: AORTIC VALVE REPLACEMENT (AVR) USING EDWARDS INSPIRIS AORTIC VALVE SIZE ;  Surgeon: Alleen Borne, MD;  Location: [facility] OR;  Service: Open Heart Surgery;  Laterality: N/A;   BIOPSY  12/30/2022   Procedure: BIOPSY;  Surgeon: Benancio Deeds, MD;  Location: [facility] ENDOSCOPY;  Service: Gastroenterology;;   COLONOSCOPY N/A 12/30/2022   Procedure: COLONOSCOPY;  Surgeon: Benancio Deeds, MD;  Location: [facility] ENDOSCOPY;  Service: Gastroenterology;  Laterality: N/A;   CORONARY ARTERY BYPASS GRAFT N/A 07/29/2021   Procedure: CORONARY ARTERY BYPASS GRAFTING (CABG) TIMES FOUR, USING LEFT INTERNAL MAMMARY ARTERY AND RIGHT GREATER SAPHENOUS VEIN HARVESTED ENDOSCOPICALLYY.;  Surgeon: Alleen Borne, MD;  Location: [facility] OR;  Service: Open Heart Surgery;  Laterality: N/A;   ESOPHAGOGASTRODUODENOSCOPY N/A 12/30/2022   Procedure: ESOPHAGOGASTRODUODENOSCOPY (EGD);  Surgeon: Benancio Deeds, MD;  Location: [facility] ENDOSCOPY;  Service: Gastroenterology;  Laterality: N/A;   fatty tumar removal     On back   HEMOSTASIS CLIP PLACEMENT  12/30/2022   Procedure: HEMOSTASIS CLIP PLACEMENT;  Surgeon: Benancio Deeds, MD;  Location: [facility] ENDOSCOPY;  Service: Gastroenterology;;   RIGHT/LEFT HEART CATH AND CORONARY ANGIOGRAPHY N/A 05/21/2021   Procedure: RIGHT/LEFT HEART CATH AND CORONARY ANGIOGRAPHY;  Surgeon: Corky Crafts, MD;  Location: [facility] INVASIVE CV LAB;  Service: Cardiovascular;  Laterality: N/A;   TEE WITHOUT CARDIOVERSION N/A 07/29/2021   Procedure: TRANSESOPHAGEAL ECHOCARDIOGRAM (TEE);  Surgeon: Alleen Borne, MD;  Location: [facility] OR;  Service: Open Heart Surgery;  Laterality: N/A;    Current Outpatient Medications  Medication Sig Dispense Refill   apixaban (ELIQUIS) 5 MG TABS tablet Take 1 tablet (5 mg total) by mouth 2 (two) times daily.     aspirin EC 81 MG tablet Take 1 tablet (81 mg total) by mouth daily. Swallow whole.      atorvastatin (LIPITOR) 80 MG tablet Take 1 tablet (80 mg total) by mouth daily. 30 tablet 0   dapagliflozin propanediol (FARXIGA) 10 MG TABS tablet Take 1 tablet (10 mg total) by mouth daily before breakfast. 90 tablet 3   famotidine (PEPCID) 40 MG tablet Take 40 mg by mouth daily.     furosemide (LASIX) 20 MG tablet Take 1 tablet by mouth on Monday Wednesday and Friday 45 tablet 3   glipiZIDE (GLUCOTROL) 5 MG tablet Take 5 mg by mouth daily.     indomethacin (INDOCIN) 25 MG capsule Take 25 mg by mouth daily as needed for mild pain or moderate pain.     levothyroxine (SYNTHROID) 137 MCG tablet Take 137 mcg by mouth daily before breakfast.     metoprolol succinate (TOPROL-XL) 25 MG 24 hr tablet Take 1 tablet (25 mg total) by mouth daily. 90 tablet 3   nitroGLYCERIN (NITROSTAT) 0.4 MG SL tablet Place 1 tablet (0.4 mg total) under the tongue every 5 (five) minutes as needed for chest pain. 25 tablet 3   pantoprazole (PROTONIX) 40 MG tablet Take 40 mg by mouth daily.     Current Facility-Administered Medications  Medication Dose Route Frequency Provider Last Rate Last Admin   0.9 %  sodium chloride infusion  500 mL Intravenous Once Lynann Bologna, MD        No Known Allergies  Social History   Socioeconomic History   Marital status: Married    Spouse name: Not on file   Number of children: 1   Years of education: Not on file   Highest education level: 10th grade  Occupational History   Occupation: retired  Tobacco Use   Smoking status: Former    Current packs/day: 0.00    Average packs/day: 1.5 packs/day for 15.0 years (22.5 ttl pk-yrs)    Types: Cigarettes    Start date: 21    Quit date: 1983    Years since quitting: 42.1   Smokeless tobacco: Never  Vaping Use   Vaping status: Never Used  Substance and Sexual Activity   Alcohol use: No   Drug use: No   Sexual activity: Not on file  Other Topics Concern   Not on file  Social History Narrative   Not on file   Social  Drivers of Health   Financial Resource Strain: Low Risk  (05/21/2021)   Overall Financial Resource Strain (CARDIA)    Difficulty of Paying Living Expenses: Not very hard  Food Insecurity: No Food Insecurity (12/28/2022)   Hunger Vital Sign    Worried About Running Out of Food in the Last Year: Never true    Ran Out of Food in the Last Year: Never true  Transportation Needs: No Transportation Needs (12/28/2022)   PRAPARE - Administrator, Civil Service (Medical): No    Lack of Transportation (Non-Medical): No  Physical Activity: Not on file  Stress: Not on file  Social Connections: Not on file  Intimate Partner Violence: Not At Risk (12/28/2022)   Humiliation, Afraid, Rape, and Kick questionnaire    Fear of Current or Ex-Partner: No  Emotionally Abused: No    Physically Abused: No    Sexually Abused: No    Family History  Problem Relation Age of Onset   Diabetes Mother    Diabetes Father    Throat cancer Brother    Heart attack Maternal Uncle    Sudden Cardiac Death Neg Hx    Liver disease Neg Hx    Colon cancer Neg Hx    Colon polyps Neg Hx    Esophageal cancer Neg Hx    Stomach cancer Neg Hx    Rectal cancer Neg Hx     Review of Systems:  As stated in the HPI and otherwise negative.   There were no vitals taken for this visit.  Physical Examination: General: Well developed, well nourished, NAD  HEENT: OP clear, mucus membranes moist  SKIN: warm, dry. No rashes. Neuro: No focal deficits  Musculoskeletal: Muscle strength 5/5 all ext  Psychiatric: Mood and affect normal  Neck: No JVD, no carotid bruits, no thyromegaly, no lymphadenopathy.  Lungs:Clear bilaterally, no wheezes, rhonci, crackles Cardiovascular: Regular rate and rhythm. No murmurs, gallops or rubs. Abdomen:Soft. Bowel sounds present. Non-tender.  Extremities: No lower extremity edema. Pulses are 2 + in the bilateral DP/PT.  EKG:  EKG is ordered today. The ekg ordered today demonstrates  ***  Recent Labs: 12/29/2022: TSH 5.597 01/07/2023: ALT 19; B Natriuretic Peptide 237.9 01/21/2023: BUN 38; Creatinine, Ser 2.33; Potassium 4.7; Sodium 136 05/05/2023: Hemoglobin 10.8; Platelets 238.0   Lipid Panel    Component Value Date/Time   CHOL 95 05/22/2022 1229   TRIG 81 05/22/2022 1229   HDL 26 (L) 05/22/2022 1229   CHOLHDL 3.7 05/22/2022 1229   VLDL 16 05/22/2022 1229   LDLCALC 53 05/22/2022 1229     Wt Readings from Last 3 Encounters:  06/05/23 80 kg  05/05/23 77.1 kg  04/06/23 77.1 kg    Assessment and Plan:   1. CAD s/p CABG without angina: No chest pain suggestive of angina. Continue ASA, Lipitor and Toprol.   2. Chronic systolic CHF: LVEF normal in 2023. Continue Toprol. He has not been on an ARB or Ace-inh due to soft BP and renal insufficiency  3. Paroxysmal atrial fibrillation: Sinus today. Continue Toprol and Eliquis.   4. Aortic stenosis: He is s/p AVR in 2023. Continue ASA, SBE prophylaxis as needed.   5. Hyperlipidemia: LDL at goal in January 2024. Continue statin. Check lipids and LFTs today.   Labs/ tests ordered today include:  No orders of the defined types were placed in this encounter.  Disposition:   F/U with me in 12 months.     Signed, Verne Carrow, MD, [facility] 06/29/2023 9:52 AM    [facility] [address] [place], [place], [place]  [address] Phone: [phone]; Fax: [phone]

## 2023-06-30 ENCOUNTER — Encounter: Payer: Self-pay | Admitting: Cardiovascular Disease

## 2023-07-02 ENCOUNTER — Ambulatory Visit (INDEPENDENT_AMBULATORY_CARE_PROVIDER_SITE_OTHER): Payer: Medicare Other | Admitting: Otolaryngology

## 2023-07-02 ENCOUNTER — Encounter (INDEPENDENT_AMBULATORY_CARE_PROVIDER_SITE_OTHER): Payer: Self-pay | Admitting: Otolaryngology

## 2023-07-02 VITALS — BP 87/58 | HR 76 | Ht 66.0 in | Wt 172.0 lb

## 2023-07-02 DIAGNOSIS — R09A2 Foreign body sensation, throat: Secondary | ICD-10-CM | POA: Diagnosis not present

## 2023-07-02 DIAGNOSIS — K219 Gastro-esophageal reflux disease without esophagitis: Secondary | ICD-10-CM | POA: Diagnosis not present

## 2023-07-02 MED ORDER — OMEPRAZOLE 20 MG PO CPDR: 20.0000 mg | DELAYED_RELEASE_CAPSULE | Freq: Every day | ORAL | 3 refills | Status: DC

## 2023-07-02 MED ORDER — FAMOTIDINE 20 MG PO TABS: 20.0000 mg | ORAL_TABLET | Freq: Every day | ORAL | 1 refills | Status: DC

## 2023-07-02 NOTE — Progress Notes (Signed)
 ENT CONSULT:  Reason for Consult: sensation of a lump in the throat    HPI: Discussed the use of AI scribe software for clinical note transcription with the patient, who gave verbal consent to proceed.  History of Present Illness   Antonio Cervantes is a 68 year old male with a history of heart attack/open heart surgery who presents with a sensation of a lump in his throat.   He has been experiencing a sensation of a lump in his throat, particularly noticeable when swallowing, for about a year. This sensation began around the time of his heart attack in April of the previous year. He does not feel the lump externally, and there is no associated pain when swallowing.  He has a history of heartburn and reflux symptoms that started after his heart attack. He experiences heartburn approximately once a week and is currently taking over-the-counter famotidine twice daily, although he does not find it effective. He previously took both Protonix and famotidine but discontinued them after a month due to lack of perceived benefit. He has not made any dietary changes and does not consume coffee. He quit smoking 43 years ago.  A barium swallow study showed esophageal dysmotility, but no significant abnormalities were noted otherwise. An EGD/endoscopy performed two months ago showed healing bleeding ulcers but did not reveal any issues per report.   No voice changes, no noisy breathing.      Records Reviewed:  Admitted for chest pain 09/23/22 Admitted for GI bleed 12/28/22  Patient is a 68 y.o.  male with history of CAD/aortic valve replacement-April 2023, A-fib on Eliquis, HFpEF, HTN, DM-2 who presented to the ED with exertional dyspnea/dizziness/lightheadedness-recent fall-he was found to have a hemoglobin of 6.4, he was found to have melena on digital rectal exam by EDP-he was admitted to [facility] on PPI-given 2 units of PRBC.  See below for further details.   Significant events: 9/1>> admit to  TRH-symptomatic anemia-melanotic stools on digital rectal exam by EDP   Significant studies: 9/1>> CXR: No active disease 9/1>> CT head: No acute abnormalities 9/1>> CT chest/abdomen/pelvis: No evidence of acute injury in the chest/abdomen/pelvis. 9/2>> echo: EF 60-65%     Procedures: 9/3>> EGD: Nonbleeding gastric ulcer-largest with pigmented material at base-clip was placed.  9/3>> colonoscopy: No mass/polyps noted.   Past Medical History:  Diagnosis Date   Atrial fibrillation and flutter (HCC)    Blood transfusion without reported diagnosis    Cardiac arrhythmia due to congenital heart disease    Cataract    CHF (congestive heart failure) (HCC)    CKD (chronic kidney disease)    Diabetes mellitus without complication (HCC)    Diabetic peripheral neuropathy associated with type 2 diabetes mellitus (HCC)    Heart failure (HCC)    High blood cholesterol    Hypertension    Myocardial infarction (HCC)    08/06/2021   Thyroid disease     Past Surgical History:  Procedure Laterality Date   AORTIC VALVE REPLACEMENT N/A 07/29/2021   Procedure: AORTIC VALVE REPLACEMENT (AVR) USING EDWARDS INSPIRIS AORTIC VALVE SIZE ;  Surgeon: Alleen Borne, MD;  Location: [facility] OR;  Service: Open Heart Surgery;  Laterality: N/A;   BIOPSY  12/30/2022   Procedure: BIOPSY;  Surgeon: Benancio Deeds, MD;  Location: [facility] ENDOSCOPY;  Service: Gastroenterology;;   COLONOSCOPY N/A 12/30/2022   Procedure: COLONOSCOPY;  Surgeon: Benancio Deeds, MD;  Location: [facility] ENDOSCOPY;  Service: Gastroenterology;  Laterality: N/A;   CORONARY ARTERY BYPASS  GRAFT N/A 07/29/2021   Procedure: CORONARY ARTERY BYPASS GRAFTING (CABG) TIMES FOUR, USING LEFT INTERNAL MAMMARY ARTERY AND RIGHT GREATER SAPHENOUS VEIN HARVESTED ENDOSCOPICALLYY.;  Surgeon: Alleen Borne, MD;  Location: [facility] OR;  Service: Open Heart Surgery;  Laterality: N/A;   ESOPHAGOGASTRODUODENOSCOPY N/A 12/30/2022   Procedure:  ESOPHAGOGASTRODUODENOSCOPY (EGD);  Surgeon: Benancio Deeds, MD;  Location: [facility] ENDOSCOPY;  Service: Gastroenterology;  Laterality: N/A;   fatty tumar removal     On back   HEMOSTASIS CLIP PLACEMENT  12/30/2022   Procedure: HEMOSTASIS CLIP PLACEMENT;  Surgeon: Benancio Deeds, MD;  Location: [facility] ENDOSCOPY;  Service: Gastroenterology;;   RIGHT/LEFT HEART CATH AND CORONARY ANGIOGRAPHY N/A 05/21/2021   Procedure: RIGHT/LEFT HEART CATH AND CORONARY ANGIOGRAPHY;  Surgeon: Corky Crafts, MD;  Location: [facility] INVASIVE CV LAB;  Service: Cardiovascular;  Laterality: N/A;   TEE WITHOUT CARDIOVERSION N/A 07/29/2021   Procedure: TRANSESOPHAGEAL ECHOCARDIOGRAM (TEE);  Surgeon: Alleen Borne, MD;  Location: [facility] OR;  Service: Open Heart Surgery;  Laterality: N/A;    Family History  Problem Relation Age of Onset   Diabetes Mother    Diabetes Father    Throat cancer Brother    Heart attack Maternal Uncle    Sudden Cardiac Death Neg Hx    Liver disease Neg Hx    Colon cancer Neg Hx    Colon polyps Neg Hx    Esophageal cancer Neg Hx    Stomach cancer Neg Hx    Rectal cancer Neg Hx     Social History:  reports that he quit smoking about 42 years ago. His smoking use included cigarettes. He started smoking about 57 years ago. He has a 22.5 pack-year smoking history. He has never used smokeless tobacco. He reports that he does not drink alcohol and does not use drugs.  Allergies: No Known Allergies  Medications: I have reviewed the patient's current medications.  The PMH, PSH, Medications, Allergies, and SH were reviewed and updated.  ROS: Constitutional: Negative for fever, weight loss and weight gain. Cardiovascular: Negative for chest pain and dyspnea on exertion. Respiratory: Is not experiencing shortness of breath at rest. Gastrointestinal: Negative for nausea and vomiting. Neurological: Negative for headaches. Psychiatric: The patient is not nervous/anxious  Blood pressure (!)  87/58, pulse 76, height 5\' 6"  (1.676 m), weight 172 lb (78 kg), SpO2 98%. Body mass index is 27.76 kg/m.  PHYSICAL EXAM:  Exam: General: Well-developed, well-nourished Communication and Voice: raspy Respiratory Respiratory effort: Equal inspiration and expiration without stridor Cardiovascular Peripheral Vascular: Warm extremities with equal color/perfusion Eyes: No nystagmus with equal extraocular motion bilaterally Neuro/Psych/Balance: Patient oriented to person, place, and time; Appropriate mood and affect; Gait is intact with no imbalance; Cranial nerves I-XII are intact Head and Face Inspection: Normocephalic and atraumatic without mass or lesion Palpation: Facial skeleton intact without bony stepoffs Salivary Glands: No mass or tenderness Facial Strength: Facial motility symmetric and full bilaterally ENT Pinna: External ear intact and fully developed External canal: Canal is patent with intact skin Tympanic Membrane: Clear and mobile External Nose: No scar or anatomic deformity Internal Nose: Septum is relatively straight. No polyp, or purulence. Mucosal edema and erythema present.  Bilateral inferior turbinate hypertrophy.  Lips, Teeth, and gums: Mucosa and teeth intact and viable TMJ: No pain to palpation with full mobility Oral cavity/oropharynx: No erythema or exudate, no lesions present Nasopharynx: No mass or lesion with intact mucosa Hypopharynx: Intact mucosa without pooling of secretions Larynx Glottic: Full true vocal cord mobility without  lesion or mass Supraglottic: Normal appearing epiglottis and AE folds Interarytenoid Space: Moderate pachydermia&edema Subglottic Space: Patent without lesion or edema Neck Neck and Trachea: Midline trachea without mass or lesion Thyroid: No mass or nodularity Lymphatics: No lymphadenopathy  Procedure: Preoperative diagnosis: globus sensation   Postoperative diagnosis:   Same + GERD LPR  Procedure: Flexible fiberoptic  laryngoscopy  Surgeon: Ashok Croon, MD  Anesthesia: Topical lidocaine and Afrin Complications: None Condition is stable throughout exam  Indications and consent:  The patient presents to the clinic with above symptoms. Indirect laryngoscopy view was incomplete. Thus it was recommended that they undergo a flexible fiberoptic laryngoscopy. All of the risks, benefits, and potential complications were reviewed with the patient preoperatively and verbal informed consent was obtained.  Procedure: The patient was seated upright in the clinic. Topical lidocaine and Afrin were applied to the nasal cavity. After adequate anesthesia had occurred, I then proceeded to pass the flexible telescope into the nasal cavity. The nasal cavity was patent without rhinorrhea or polyp. The nasopharynx was also patent without mass or lesion. The base of tongue was visualized and was normal. There were no signs of pooling of secretions in the piriform sinuses. The true vocal folds were mobile bilaterally. There were no signs of glottic or supraglottic mucosal lesion or mass. There was moderate interarytenoid pachydermia and post cricoid edema. The telescope was then slowly withdrawn and the patient tolerated the procedure throughout.   Studies Reviewed: EGD 05/05/23   12/28/22 CT chest/A/P IMPRESSION: 1. No evidence of acute injury to the chest, abdomen or pelvis. 2. Aortic and coronary artery atherosclerotic vascular calcifications. 3. Surgical changes of prior median sternotomy with aortic valve replacement and multivessel CABG. 4. Evaluation of the intra-abdominal and pelvic structures is somewhat limited due to extensive streak artifact from high density barium throughout the colon. 5. Mild multilevel degenerative changes throughout the spine.  Esophagram 12/23/22 IMPRESSION: Mild esophageal dysmotility, likely presbyesophagus. No other explanation for patient's symptoms.  Assessment/Plan: Encounter  Diagnoses  Name Primary?   Chronic GERD Yes   Globus sensation     Assessment and Plan    Globus sensation x 1 year Sensation of a lump in the throat when swallowing, persisting for about a year, intermittent and not associated with pain. Scope exam revealed no mass or tumor, and the upper airway is open, but he did have changes c/w GERD LPR. Esophagram and upper endoscopy did not show significant abnormalities. Most likely caused by irritation from gastroesophageal reflux disease (GERD). - Provide information on dietary modifications to manage reflux, such as avoiding late meals and caffeine. - Recommend a supplement to be taken after meals to prevent reflux symptoms - Reflux Gourmet - Prescribe omeprazole 20 mg to be taken in the morning, 30 minutes before eating. - Advise taking famotidine (Pepcid) 20 mg at night.  Gastroesophageal reflux disease (GERD) LPR Experiences heartburn symptoms approximately weekly, which started after a myocardial infarction and subsequent coronary artery bypass grafting in April of last year. Famotidine is not effective. Previously took pantoprazole but discontinued due to lack of perceived benefit. Scope exam showed reflux changes without mass or tumor. Treatment plan focuses on managing GERD to alleviate the globus sensation. Omeprazole is preferred over long-term pantoprazole use. - Provide information on dietary modifications to manage reflux, such as avoiding late meals and caffeine. - Recommend a supplement to be taken after meals to prevent reflux symptoms - Reflux Gourmet - Prescribe omeprazole 20 mg to be taken in the morning, 30  minutes before eating. - Advise taking famotidine (Pepcid) 20 mg at night.   Thank you for allowing me to participate in the care of this patient. Please do not hesitate to contact me with any questions or concerns.   Ashok Croon, MD Otolaryngology [facility] Phone: [phone] Fax:  [phone]    07/02/2023, 3:00 PM

## 2023-07-02 NOTE — Patient Instructions (Signed)

## 2023-07-25 ENCOUNTER — Other Ambulatory Visit (HOSPITAL_COMMUNITY): Payer: Self-pay | Admitting: Family Medicine

## 2023-07-25 DIAGNOSIS — I5020 Unspecified systolic (congestive) heart failure: Secondary | ICD-10-CM

## 2023-08-03 ENCOUNTER — Other Ambulatory Visit (INDEPENDENT_AMBULATORY_CARE_PROVIDER_SITE_OTHER): Payer: Self-pay | Admitting: Otolaryngology

## 2023-08-06 ENCOUNTER — Telehealth: Payer: Self-pay | Admitting: Gastroenterology

## 2023-08-06 DIAGNOSIS — E1122 Type 2 diabetes mellitus with diabetic chronic kidney disease: Secondary | ICD-10-CM | POA: Diagnosis not present

## 2023-08-06 DIAGNOSIS — D649 Anemia, unspecified: Secondary | ICD-10-CM | POA: Diagnosis not present

## 2023-08-06 DIAGNOSIS — I10 Essential (primary) hypertension: Secondary | ICD-10-CM | POA: Diagnosis not present

## 2023-08-06 DIAGNOSIS — Z952 Presence of prosthetic heart valve: Secondary | ICD-10-CM | POA: Diagnosis not present

## 2023-08-06 DIAGNOSIS — E782 Mixed hyperlipidemia: Secondary | ICD-10-CM | POA: Diagnosis not present

## 2023-08-06 DIAGNOSIS — E039 Hypothyroidism, unspecified: Secondary | ICD-10-CM | POA: Diagnosis not present

## 2023-08-06 DIAGNOSIS — N182 Chronic kidney disease, stage 2 (mild): Secondary | ICD-10-CM | POA: Diagnosis not present

## 2023-08-06 DIAGNOSIS — E538 Deficiency of other specified B group vitamins: Secondary | ICD-10-CM | POA: Diagnosis not present

## 2023-08-06 LAB — LAB REPORT - SCANNED
A1c: 7.4
EGFR: 28

## 2023-08-06 NOTE — Telephone Encounter (Signed)
 Patient Daughter in law called and stated that she is wanting to know if can accept her father in laws lab work done today at his primary for Dr. Chales Abrahams to look at. Patient daughter in law is requesting a call back at [phone]. Please advise.

## 2023-08-06 NOTE — Telephone Encounter (Signed)
 Pt had labs collected at PCP office today & daughter in law will have them fax results to [phone] for Dr. Chales Abrahams to review since patient is overdue for CBC recheck. Recently lost his mother & hasn't been able to stop by.

## 2023-08-11 NOTE — Telephone Encounter (Signed)
 Received labs from PCP office. Placed on Dr. Hobert Lull desk for review.

## 2023-08-11 NOTE — Telephone Encounter (Signed)
 Copy of labs have not been received yet. Fax sent to PCP office requesting copy.

## 2023-08-13 NOTE — Telephone Encounter (Signed)
 Labs reviewed from 08/06/2023 ([facility], [place]) -Hemoglobin A1c 7.4 -BUN/creatinine 40/2.4 with GFR 28 mL/min -Normal LFTs with AST 21, ALT 18, albumin 4.1 -Hemoglobin is better at 11.3, MCV 93.  Normal iron studies except TIBC 225 consistent with anemia of chronic disease.  Reticulocyte count 1.4 RG

## 2023-08-13 NOTE — Telephone Encounter (Signed)
 Daughter in law made aware of results/recommendations & verbalized all understanding.

## 2023-08-13 NOTE — Telephone Encounter (Signed)
Left message for daughter in law to call back

## 2023-08-26 NOTE — Progress Notes (Signed)
 Cardiology Office Note    Patient Name: Antonio Cervantes Date of Encounter: 08/26/2023  Primary Care Provider:  Freida Jes, FNP Primary Cardiologist:  None Primary Electrophysiologist: None   Past Medical History    Past Medical History:  Diagnosis Date   Atrial fibrillation and flutter (HCC)    Blood transfusion without reported diagnosis    Cardiac arrhythmia due to congenital heart disease    Cataract    CHF (congestive heart failure) (HCC)    CKD (chronic kidney disease)    Diabetes mellitus without complication (HCC)    Diabetic peripheral neuropathy associated with type 2 diabetes mellitus (HCC)    Heart failure (HCC)    High blood cholesterol    Hypertension    Myocardial infarction (HCC)    08/06/2021   Thyroid  disease     History of Present Illness  Antonio Cervantes is a 68 y.o. male with a PMH of CAD s/p NSTEMI initially on 1/23 with medical therapy, NSTEMI 06/2021 with three-vessel disease, CABG x 4 with AVR, ICM, persistent AF, hypothyroidism, HFrEF, CKD stage IIIb,DM type II, HLD, GI bleed    Antonio Cervantes  was seen initially in 05/20/2021 for complaint of chest pain and was found to have NSTEMI with acute CHF and new onset AF with RVR.  LHC was performed showing diffuse underlying CAD with 100% RCA, diffuse LAD and circumflex disease that was deemed nongraftable. Echo showed severely reduced LVEF, 20-25%, RV normal.  He converted spontaneously to sinus rhythm and medical therapy was recommended for treatment.  GDMT for HFrEF was limited due to soft BP and renal insufficiency.  He was started on Eliquis  at discharge as well as Farxiga  and digoxin .  He was admitted 07/24/2021 with complaint of chest pain with troponin trending upward [id number]. cMRI was completed showing LVEF 47%, normal RVEF, viable multi-vessel disease. CVTS was consulted and patient underwent CABG x 4 with AVR with postoperative course complicated by persistent AF that was resistant to amiodarone .   2D echo was completed 10/18/2021 showing normalized EF of 55 to 60% with grade 2 DD AV working well.  He was admitted on 07/12/2021 with complaint of chest pain with no ACS based on workup and found to have acute on chronic CHF exacerbation treated with diuretics and complicated by AKI.   He was admitted on on 12/28/2022 with complaints of dyspnea and dizziness with lightheadedness.  Hemoglobin was 6.4 in the setting of NSAID use for gout and patient found to have melena on digital exam.  Troponins were significantly elevated thought to be related to demand ischemia in the setting of anemia.  EKG completed showing sinus with inferior lateral ST segment changes consistent with ischemia.  He was transfused with 2 units of PRBC and was consulted by GI and completed EGD that showed gastric ulcer that was stable with no further bleeding.  Colonoscopy also completed showing no mass or polyps after exam.  Patient had additional unit of PRBC and 2D echo was completed showing EF of 60 to 65% with no RWMA, mild MVR with normal gradient at AV valve.  He was started on Protonix  x 4 weeks then once daily and advised to avoid NSAIDs.  He was cleared to resume Eliquis  on 01/04/2023.  He was seen in follow-up by Antonio Cervantes on 06/05/2023 and reported doing well with yard work and working in his shed.  No medication changes made with plan to repeat echo in next visit.  Antonio Cervantes presents today for  annual follow-up with his daughter and granddaughter.  He is euvolemic on examination and reports compliance with his current medication regimen.  He denies any new cardiac complaints but does endorse occasional episodes of dizziness, particularly when rising from a seated position, which he attributes to his medications. No bleeding problems, chest pain, or shortness of breath during activities, except occasionally when climbing stairs quickly. His legs are not swollen, but he experiences coldness occasionally. His blood pressure has been  low, with readings of 90/54 mmHg after medication. He monitors his blood pressure at home. He is on medications including Farxiga  and Toprol , but not on losartan  due to high potassium levels. He has type 2 diabetes, with blood sugars generally well-controlled, though he occasionally indulges in dietary choices like pound cake. His last A1c was 7.4%. Patient denies chest pain, palpitations, dyspnea, PND, orthopnea, nausea, vomiting, dizziness, syncope, edema, weight gain, or early satiety.  Discussed the use of AI scribe software for clinical note transcription with the patient, who gave verbal consent to proceed.  History of Present Illness   Review of Systems  Please see the history of present illness.    All other systems reviewed and are otherwise negative except as noted above.  Physical Exam    Wt Readings from Last 3 Encounters:  07/02/23 172 lb (78 kg)  06/05/23 176 lb 6.4 oz (80 kg)  05/05/23 170 lb (77.1 kg)   NW:GNFAO were no vitals filed for this visit.,There is no height or weight on file to calculate BMI. GEN: Well nourished, well developed in no acute distress Neck: No JVD; No carotid bruits Pulmonary: Clear to auscultation without rales, wheezing or rhonchi  Cardiovascular: Normal rate. Regular rhythm. Normal S1. Normal S2.   Murmurs: There is no murmur.  ABDOMEN: Soft, non-tender, non-distended EXTREMITIES:  No edema; No deformity   EKG/LABS/ Recent Cardiac Studies   ECG personally reviewed by me today -none completed today  Risk Assessment/Calculations:    CHA2DS2-VASc Score = 5   This indicates a 7.2% annual risk of stroke. The patient's score is based upon: CHF History: 1 HTN History: 1 Diabetes History: 1 Stroke History: 0 Vascular Disease History: 1 Age Score: 1 Gender Score: 0         Lab Results  Component Value Date   WBC 8.7 05/05/2023   HGB 10.8 (L) 05/05/2023   HCT 31.5 (L) 05/05/2023   MCV 92.0 05/05/2023   PLT 238.0 05/05/2023   Lab  Results  Component Value Date   CREATININE 2.33 (H) 01/21/2023   BUN 38 (H) 01/21/2023   NA 136 01/21/2023   K 4.7 01/21/2023   CL 107 01/21/2023   CO2 19 (L) 01/21/2023   Lab Results  Component Value Date   CHOL 95 05/22/2022   HDL 26 (L) 05/22/2022   LDLCALC 53 05/22/2022   TRIG 81 05/22/2022   CHOLHDL 3.7 05/22/2022    Lab Results  Component Value Date   HGBA1C 7.6 (H) 07/11/2022   Assessment & Plan    1.  HFrEF/ICM: -Most recent 2D echo completed on 12/28/2022 showing EF of 60 to 65% no RWMA and mild concentric LVH, mild MVR -Today patient reports occasional dizziness likely from metoprolol  and Lasix  - Patient will contact office if he continues to have symptoms with plan to reduce diuretic and beta-blocker. -Continue current GDMT with Farxiga  10 mg, Toprol -XL 25 mg, Lasix  20 mg twice daily  -Low sodium diet, fluid restriction <2L, and daily weights encouraged. Educated to contact  our office for weight gain of 2 lbs overnight or 5 lbs in one week.   2.  Coronary artery disease: -s/p NSTEMI 06/2021 with CABG x 4 completed 07/2021 elevated troponin during admission recent admission due to demand ischemia -Patient reports no chest pain or angina since previous follow-up.  -Continue current GDMT with ASA 81 mg, Lipitor  80 mg, Toprol -XL 25 mg and Nitrostat  as needed 0.4 mg  3.  Persistent AF: -Patient maintaining sinus rhythm -Eliquis  was held during admission with guidance to restart 9/8 -Today patient reports no episodes of bleeding in urine or stool. -Patient's rate currently controlled at 65 bpm -Continue Eliquis  5 mg twice daily and metoprolol  25 mg daily -CHA2DS2-VASc Score = 5 [CHF History: 1, HTN History: 1, Diabetes History: 1, Stroke History: 0, Vascular Disease History: 1, Age Score: 1, Gender Score: 0].  Therefore, the patient's annual risk of stroke is 7.2 %.       4.  Nonrheumatic AVR: -s/p AVR repair 07/2021 -SBE prophylaxis discussed  -Good valve function on  echocardiogram. No dysfunction symptoms. - Continue routine valve function monitoring.  5.  CKD stage IV: -Patient's most recent creatinine was 2.4 - Continue current treatment plan per nephrology   Disposition: Follow-up with Dr. Bensimhon as scheduled    Signed, Francene Ing, Retha Cast, NP 08/26/2023, 6:27 PM [facility]

## 2023-08-27 ENCOUNTER — Encounter: Payer: Self-pay | Admitting: Nurse Practitioner

## 2023-08-27 ENCOUNTER — Ambulatory Visit: Attending: Nurse Practitioner | Admitting: Nurse Practitioner

## 2023-08-27 VITALS — BP 98/64 | HR 65 | Ht 66.0 in | Wt 170.8 lb

## 2023-08-27 DIAGNOSIS — I5020 Unspecified systolic (congestive) heart failure: Secondary | ICD-10-CM | POA: Diagnosis not present

## 2023-08-27 DIAGNOSIS — N1832 Chronic kidney disease, stage 3b: Secondary | ICD-10-CM | POA: Diagnosis not present

## 2023-08-27 DIAGNOSIS — I350 Nonrheumatic aortic (valve) stenosis: Secondary | ICD-10-CM | POA: Diagnosis not present

## 2023-08-27 DIAGNOSIS — I2510 Atherosclerotic heart disease of native coronary artery without angina pectoris: Secondary | ICD-10-CM

## 2023-08-27 DIAGNOSIS — I480 Paroxysmal atrial fibrillation: Secondary | ICD-10-CM

## 2023-08-27 NOTE — Patient Instructions (Signed)
 Medication Instructions:  Your physician recommends that you continue on your current medications as directed. Please refer to the Current Medication list given to you today. *If you need a refill on your cardiac medications before your next appointment, please call your pharmacy*  Lab Work: NONE ORDERED If you have labs (blood work) drawn today and your tests are completely normal, you will receive your results only by: MyChart Message (if you have MyChart) OR A paper copy in the mail If you have any lab test that is abnormal or we need to change your treatment, we will call you to review the results.  Testing/Procedures: NONE ORDERED  Follow-Up: At [facility], you and your health needs are our priority.  As part of our continuing mission to provide you with exceptional heart care, our providers are all part of one team.  This team includes your primary Cardiologist (physician) and Advanced Practice Providers or APPs (Physician Assistants and Nurse Practitioners) who all work together to provide you with the care you need, when you need it.  Your next appointment:   12 month(s)  Provider:   Dorothye Gathers, MD   We recommend signing up for the patient portal called "MyChart".  Sign up information is provided on this After Visit Summary.  MyChart is used to connect with patients for Virtual Visits (Telemedicine).  Patients are able to view lab/test results, encounter notes, upcoming appointments, etc.  Non-urgent messages can be sent to your provider as well.   To learn more about what you can do with MyChart, go to [email or web address].   Other Instructions Please check your weight daily. Please contact the office if you gain more than 2lbs in a day or 5lbs in a week.  Limit your salt intake to 1500-2000mg  per day or 500mg  of Sodium per meal.

## 2023-09-03 ENCOUNTER — Other Ambulatory Visit (HOSPITAL_COMMUNITY): Payer: Self-pay | Admitting: Internal Medicine

## 2023-09-09 ENCOUNTER — Telehealth (HOSPITAL_COMMUNITY): Payer: Self-pay

## 2023-09-09 NOTE — Telephone Encounter (Signed)
 Advanced Heart Failure Patient Advocate Encounter  Pt daughter called concerned about grant coverage for Eliquis . Discussed grant coverage and informed her that I am not currently aware of any grants open that would cover the cost of Eliquis . She will reach out to other office to see if she can provide any information about a grant that the pt may have previously been on for Eliquis . Samples provided in the meantime.   Medication Samples have been left at registration desk for patient pick up. Drug name: Eliquis  5 MG Qty: 4x 14 ct packages LOT: [id number] Exp.: 09/2024 SIG: Take 1 tablet by mouth twice daily   The patient has been instructed regarding the correct time, dose, and frequency of taking this medication, including desired effects and most common side effects.   Kennis Peacock, CPhT Rx Patient Advocate Phone: [phone]

## 2023-09-10 ENCOUNTER — Telehealth: Payer: Self-pay | Admitting: Gastroenterology

## 2023-09-10 NOTE — Telephone Encounter (Signed)
 Patient daughter in law is calling due to her father in law having symptoms of weakness and shakiness along with black stool. Patient daughter in law is worried he might be having a bleeding ulcer again. Patient daughter in law is requesting a call back. Please advise.

## 2023-09-10 NOTE — Telephone Encounter (Signed)
 Received secure chat from Dr. Venice Gillis. "Looks like melena on eliquis . Pl send him to [facility] ED or [facility] ED @ [address] (where cancer center is located). Hold eliquis  until eval." Daughter [name] made aware & she plans to take him this evening to [facility]. He did take his eliquis  this morning, but had previously been off of it for 4 days. Aware to hold any further doses until seen.

## 2023-09-10 NOTE — Telephone Encounter (Signed)
 Returned Jessica's call & she stated patient had been experiencing black stool (1-2/daily) for the last few days. He also complains of weakness, occasional dizziness, and bloating. Denies any pain, n/v. He's currently on an iron supplement daily, prilosec 20 mg daily, and pepcid  20 mg at night. Takes Eliquis  daily. He was last seen for endo with Dr. Venice Gillis on 05/05/2023. Daughter is concerned this could be another GIB. Discussed ED precautions with patient & daughter.

## 2023-09-23 NOTE — Telephone Encounter (Signed)
 Inbound call from patient's daughter in law stating patient has not been to the emergency room. States patient's symptoms are progressively getting worse and also losing blood. Daughter in law is requesting a call back. Please advise, thank you.

## 2023-09-23 NOTE — Telephone Encounter (Signed)
 Antonio Cervantes, thanks for letting me know. Patient has not yet arrived to [facility] ED.  Pls contact the patient tomorrow for further update if he does not show up in the ED. THX.

## 2023-09-23 NOTE — Telephone Encounter (Signed)
 Pt daughter in [place] stated that the pt has been having blood in his stool for going on two weeks now and that the pt is starting to have symptoms of being lightheaded, dizziness  and low energy.  Pt chart was reviewed and noted Dr. Venice Gillis recommendations from 13 days ago. [name] was notified that the pt needs to go to the ED ASAP. [name] stated that she was going to go back home and tell him this Information and take him to [facility] ED.  Routed as FYI as you are working in the Hospital this Week.

## 2023-09-24 ENCOUNTER — Other Ambulatory Visit: Payer: Self-pay

## 2023-09-24 ENCOUNTER — Encounter (HOSPITAL_COMMUNITY): Payer: Self-pay

## 2023-09-24 ENCOUNTER — Emergency Department (HOSPITAL_COMMUNITY)
Admission: EM | Admit: 2023-09-24 | Discharge: 2023-09-24 | Discharge status: Against Medical Advice | Attending: Emergency Medicine | Admitting: Emergency Medicine

## 2023-09-24 DIAGNOSIS — Z5321 Procedure and treatment not carried out due to patient leaving prior to being seen by health care provider: Secondary | ICD-10-CM | POA: Insufficient documentation

## 2023-09-24 DIAGNOSIS — R195 Other fecal abnormalities: Secondary | ICD-10-CM | POA: Diagnosis not present

## 2023-09-24 LAB — COMPREHENSIVE METABOLIC PANEL WITH GFR
ALT: 20 U/L (ref 0–44)
AST: 22 U/L (ref 15–41)
Albumin: 3.9 g/dL (ref 3.5–5.0)
Alkaline Phosphatase: 92 U/L (ref 38–126)
Anion gap: 6 (ref 5–15)
BUN: 55 mg/dL — ABNORMAL HIGH (ref 8–23)
CO2: 22 mmol/L (ref 22–32)
Calcium: 8.8 mg/dL — ABNORMAL LOW (ref 8.9–10.3)
Chloride: 106 mmol/L (ref 98–111)
Creatinine, Ser: 2.74 mg/dL — ABNORMAL HIGH (ref 0.61–1.24)
GFR, Estimated: 24 mL/min — ABNORMAL LOW (ref >60)
Glucose, Bld: 118 mg/dL — ABNORMAL HIGH (ref 70–99)
Potassium: 4.6 mmol/L (ref 3.5–5.1)
Sodium: 134 mmol/L — ABNORMAL LOW (ref 135–145)
Total Bilirubin: 0.8 mg/dL (ref 0.0–1.2)
Total Protein: 8 g/dL (ref 6.5–8.1)

## 2023-09-24 LAB — CBC
HCT: 36.2 % — ABNORMAL LOW (ref 39.0–52.0)
Hemoglobin: 11.6 g/dL — ABNORMAL LOW (ref 13.0–17.0)
MCH: 30.2 pg (ref 26.0–34.0)
MCHC: 32 g/dL (ref 30.0–36.0)
MCV: 94.3 fL (ref 80.0–100.0)
Platelets: 280 10*3/uL (ref 150–400)
RBC: 3.84 MIL/uL — ABNORMAL LOW (ref 4.22–5.81)
RDW: 13.5 % (ref 11.5–15.5)
WBC: 9.3 10*3/uL (ref 4.0–10.5)
nRBC: 0 % (ref 0.0–0.2)

## 2023-09-24 LAB — TYPE AND SCREEN
ABO/RH(D): A POS
Antibody Screen: NEGATIVE

## 2023-09-24 LAB — HEMOGLOBIN AND HEMATOCRIT, BLOOD
HCT: 33.9 % — ABNORMAL LOW (ref 39.0–52.0)
Hemoglobin: 11.2 g/dL — ABNORMAL LOW (ref 13.0–17.0)

## 2023-09-24 NOTE — ED Triage Notes (Signed)
 Patient presented to ER with blood in stool, patient endorses having bleeding ulcers in stomach before, states he feels like it is happening again. Patient endorses taking blood thinners.

## 2023-09-24 NOTE — Telephone Encounter (Signed)
 Spoke with daughter in law [name] & she stated patient did not want to go yesterday evening, but will be going to [facility] ED today.

## 2023-09-24 NOTE — Telephone Encounter (Signed)
Dr. Collene Gobble :

## 2023-09-25 ENCOUNTER — Other Ambulatory Visit: Payer: Self-pay

## 2023-09-25 NOTE — Telephone Encounter (Signed)
 For now please increase the omeprazole  40 mg twice daily for 4 weeks then go down to omeprazole  40 mg once daily can continue Pepcid  at night. Please have patient follow-up on Monday or Tuesday for repeat CBC stat Advised to go to the ER if there is any severe weakness, severe abdominal pain, vomit blood, dark red blood in your bowel movement, shortness of breath or chest pain.

## 2023-09-25 NOTE — Telephone Encounter (Signed)
Left message for Antonio Cervantes to call back.  

## 2023-09-25 NOTE — Telephone Encounter (Signed)
 Spoke with daughter in law Antonio Cervantes & she stated patient was seen at [facility] ED yesterday after a 9 hour wait. Hgb is 11.2. No bleeding on rectal exam per daughter in law. States MD advised them they may want to increase omeprazole , but left the room d/t a phone call & never came back so they left. Calling to follow up with our office for next steps. Pt is on 20 mg omeprazole  & 20 mg famotidine  daily. States pt may have improved some, but still having some black stool (unsure how frequently) & some weakness. He has been off his Eliquis  for 2 weeks. Last seen with Dr. Venice Gillis for endo on 05/05/23.

## 2023-09-25 NOTE — Telephone Encounter (Signed)
 PT daughter in law [name] is returning call about ED visit. She explained it was a joke. The doctor came in to see them after a 7.5 hour wait only to have him abruptly leave during a phone call and never coming back. They eventually just left and gave up.  She would like to discuss next steps they can take. Please advise.

## 2023-09-30 ENCOUNTER — Other Ambulatory Visit: Payer: Self-pay

## 2023-09-30 ENCOUNTER — Telehealth (HOSPITAL_COMMUNITY): Payer: Self-pay | Admitting: Pharmacy Technician

## 2023-09-30 DIAGNOSIS — K922 Gastrointestinal hemorrhage, unspecified: Secondary | ICD-10-CM

## 2023-09-30 DIAGNOSIS — Z8711 Personal history of peptic ulcer disease: Secondary | ICD-10-CM

## 2023-09-30 DIAGNOSIS — D649 Anemia, unspecified: Secondary | ICD-10-CM

## 2023-09-30 DIAGNOSIS — K259 Gastric ulcer, unspecified as acute or chronic, without hemorrhage or perforation: Secondary | ICD-10-CM

## 2023-09-30 MED ORDER — OMEPRAZOLE 40 MG PO CPDR: DELAYED_RELEASE_CAPSULE | ORAL | 3 refills | Status: DC

## 2023-09-30 NOTE — Telephone Encounter (Signed)
 [name] stated that she is just returning a call from [employer]. [name] stated that the pt stated that he feels that the bleeding is getting a little less.  [name] was made aware of [name] PA recommendations: Prescription was sent  to pharmacy. [name] made aware.  Order for lab placed in Epic. [name] made aware. [name] verbalized understanding with all questions answered.

## 2023-09-30 NOTE — Telephone Encounter (Signed)
 Advanced Heart Failure Patient Advocate Encounter  Received a vm from the patient's daughter. She is stating that the pharmacy is not billing the patients grant for Farxiga . Called and spoke with the pharmacy, they updated the claim. Co-pay now, $0.  Called and updated patients daughter. Sent grant information via email.  Correne Dillon, CPhT

## 2023-10-07 ENCOUNTER — Other Ambulatory Visit (INDEPENDENT_AMBULATORY_CARE_PROVIDER_SITE_OTHER): Payer: Self-pay | Admitting: Otolaryngology

## 2023-10-24 ENCOUNTER — Other Ambulatory Visit (HOSPITAL_COMMUNITY): Payer: Self-pay | Admitting: Family Medicine

## 2023-10-28 ENCOUNTER — Other Ambulatory Visit (HOSPITAL_COMMUNITY): Payer: Self-pay

## 2023-11-02 NOTE — Telephone Encounter (Signed)
 Samples were not picked up in office, returned to stock

## 2023-11-11 ENCOUNTER — Other Ambulatory Visit: Payer: Self-pay | Admitting: Nurse Practitioner

## 2023-11-11 DIAGNOSIS — R519 Headache, unspecified: Secondary | ICD-10-CM | POA: Diagnosis not present

## 2023-11-11 DIAGNOSIS — N182 Chronic kidney disease, stage 2 (mild): Secondary | ICD-10-CM | POA: Diagnosis not present

## 2023-11-11 DIAGNOSIS — M109 Gout, unspecified: Secondary | ICD-10-CM | POA: Diagnosis not present

## 2023-11-11 DIAGNOSIS — Z9181 History of falling: Secondary | ICD-10-CM | POA: Diagnosis not present

## 2023-11-11 DIAGNOSIS — Z139 Encounter for screening, unspecified: Secondary | ICD-10-CM | POA: Diagnosis not present

## 2023-11-11 DIAGNOSIS — D649 Anemia, unspecified: Secondary | ICD-10-CM | POA: Diagnosis not present

## 2023-11-11 DIAGNOSIS — Z800 Family history of malignant neoplasm of digestive organs: Secondary | ICD-10-CM | POA: Diagnosis not present

## 2023-11-11 DIAGNOSIS — E1122 Type 2 diabetes mellitus with diabetic chronic kidney disease: Secondary | ICD-10-CM | POA: Diagnosis not present

## 2023-11-13 DIAGNOSIS — I480 Paroxysmal atrial fibrillation: Secondary | ICD-10-CM | POA: Diagnosis not present

## 2023-11-13 DIAGNOSIS — N184 Chronic kidney disease, stage 4 (severe): Secondary | ICD-10-CM | POA: Diagnosis not present

## 2023-11-13 DIAGNOSIS — Z952 Presence of prosthetic heart valve: Secondary | ICD-10-CM | POA: Diagnosis not present

## 2023-11-13 DIAGNOSIS — N1832 Chronic kidney disease, stage 3b: Secondary | ICD-10-CM | POA: Diagnosis not present

## 2023-11-13 DIAGNOSIS — I129 Hypertensive chronic kidney disease with stage 1 through stage 4 chronic kidney disease, or unspecified chronic kidney disease: Secondary | ICD-10-CM | POA: Diagnosis not present

## 2023-11-13 DIAGNOSIS — N179 Acute kidney failure, unspecified: Secondary | ICD-10-CM | POA: Diagnosis not present

## 2023-11-13 DIAGNOSIS — N2581 Secondary hyperparathyroidism of renal origin: Secondary | ICD-10-CM | POA: Diagnosis not present

## 2023-11-13 DIAGNOSIS — E1122 Type 2 diabetes mellitus with diabetic chronic kidney disease: Secondary | ICD-10-CM | POA: Diagnosis not present

## 2023-11-13 DIAGNOSIS — M109 Gout, unspecified: Secondary | ICD-10-CM | POA: Diagnosis not present

## 2023-11-13 DIAGNOSIS — I5032 Chronic diastolic (congestive) heart failure: Secondary | ICD-10-CM | POA: Diagnosis not present

## 2023-11-19 ENCOUNTER — Other Ambulatory Visit

## 2023-11-23 ENCOUNTER — Other Ambulatory Visit

## 2023-11-23 IMAGING — DX DG CHEST 1V PORT
1 series · 1 of 1 positions shown · non-contrast
Comparison: Chest radiograph 07/22/2021

CLINICAL DATA: Status post aortic valve replacement.

EXAM:
PORTABLE CHEST 1 VIEW

[chest]
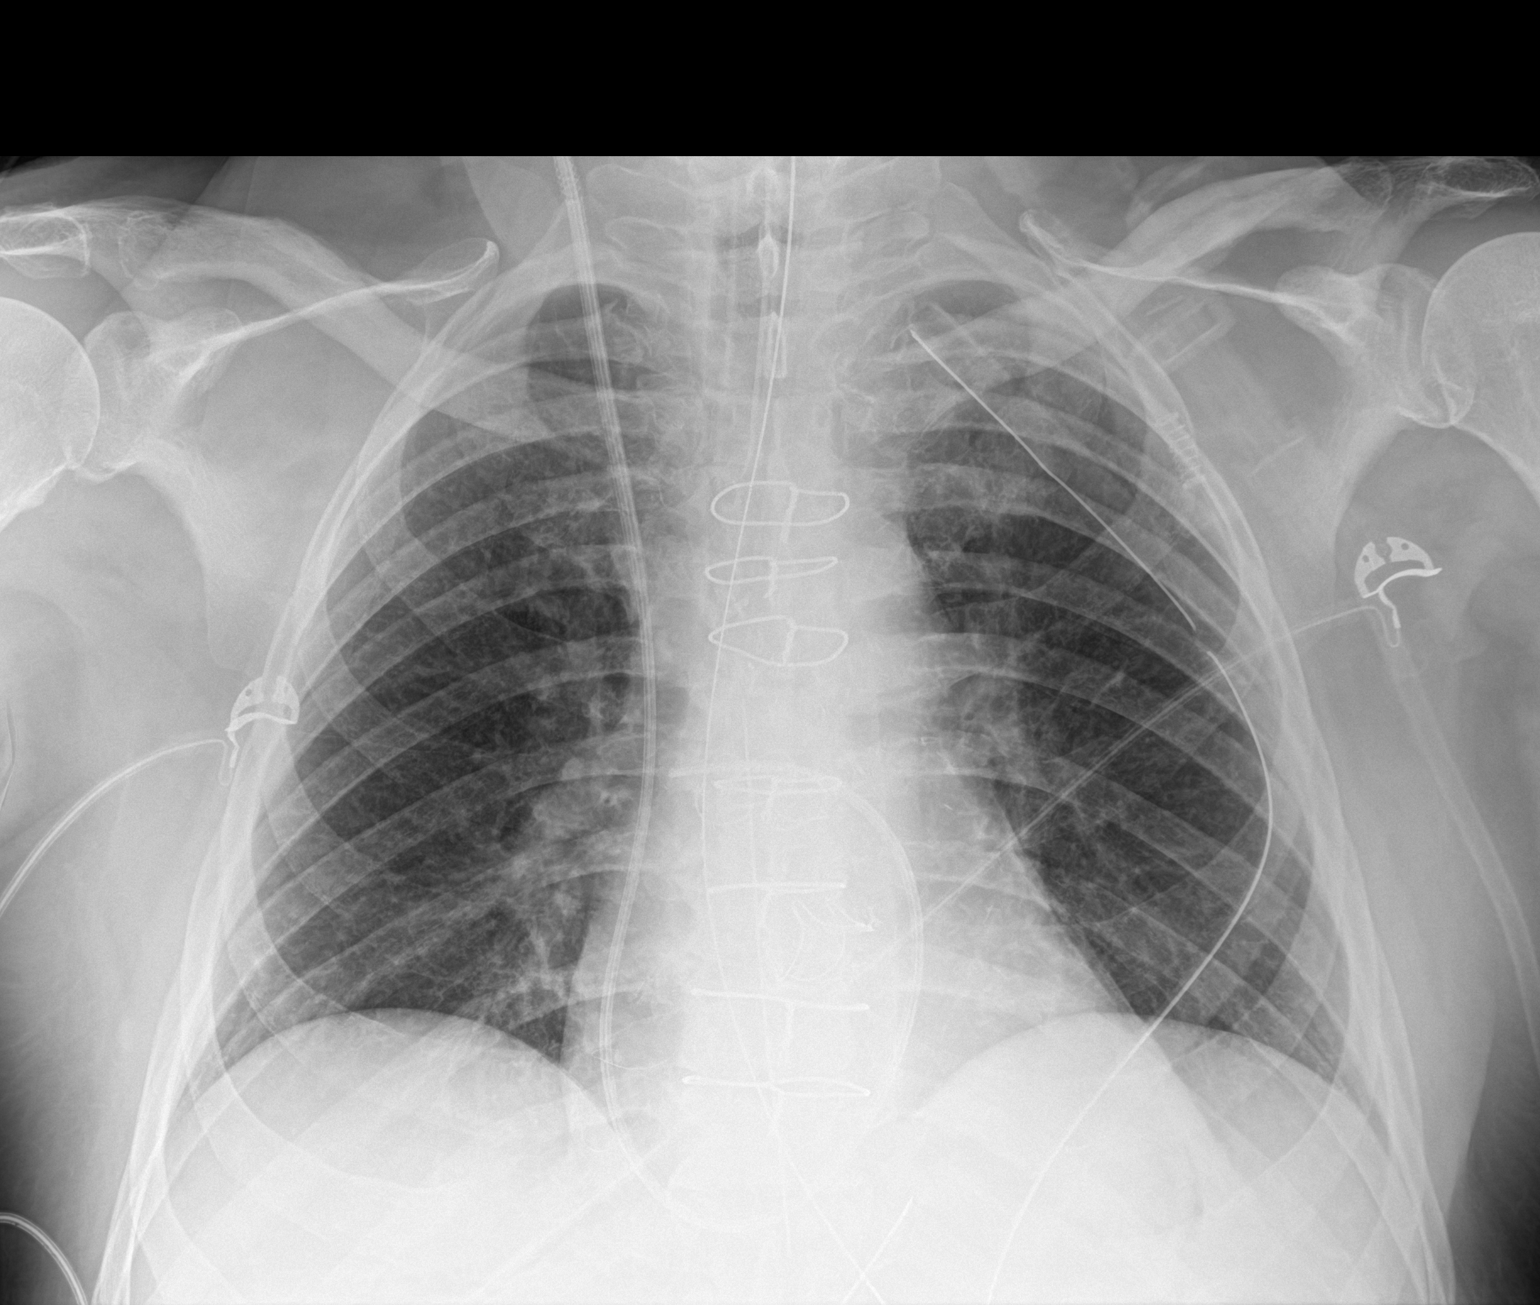

[1 of 1 positions shown; findings below may reference images not displayed]

FINDINGS: Sequelae of interval aortic valve replacement are identified.
Endotracheal tube terminates at the level of the clavicular heads. A
right jugular Swan-Ganz catheter terminates over the right main
pulmonary artery. Mediastinal drains and a left chest tube are
noted. An enteric tube courses into the abdomen with tip not imaged.
The cardiomediastinal silhouette is unchanged with normal heart
size. Lung volumes are low. No airspace consolidation, edema,
sizable pleural effusion, or pneumothorax is identified. No acute
osseous abnormality is seen.
IMPRESSION: Interval aortic valve replacement with support devices as above. No
evidence of acute airspace disease or pneumothorax.

## 2023-11-24 ENCOUNTER — Telehealth: Payer: Self-pay | Admitting: Nurse Practitioner

## 2023-11-24 IMAGING — DX DG CHEST 1V PORT
1 series · 1 of 1 positions shown · non-contrast
Comparison: Portable chest yesterday at [DATE] p.m.

CLINICAL DATA: Aortic valve replacement and four-vessel CABG.

EXAM:
PORTABLE CHEST 1 VIEW

[chest ap]
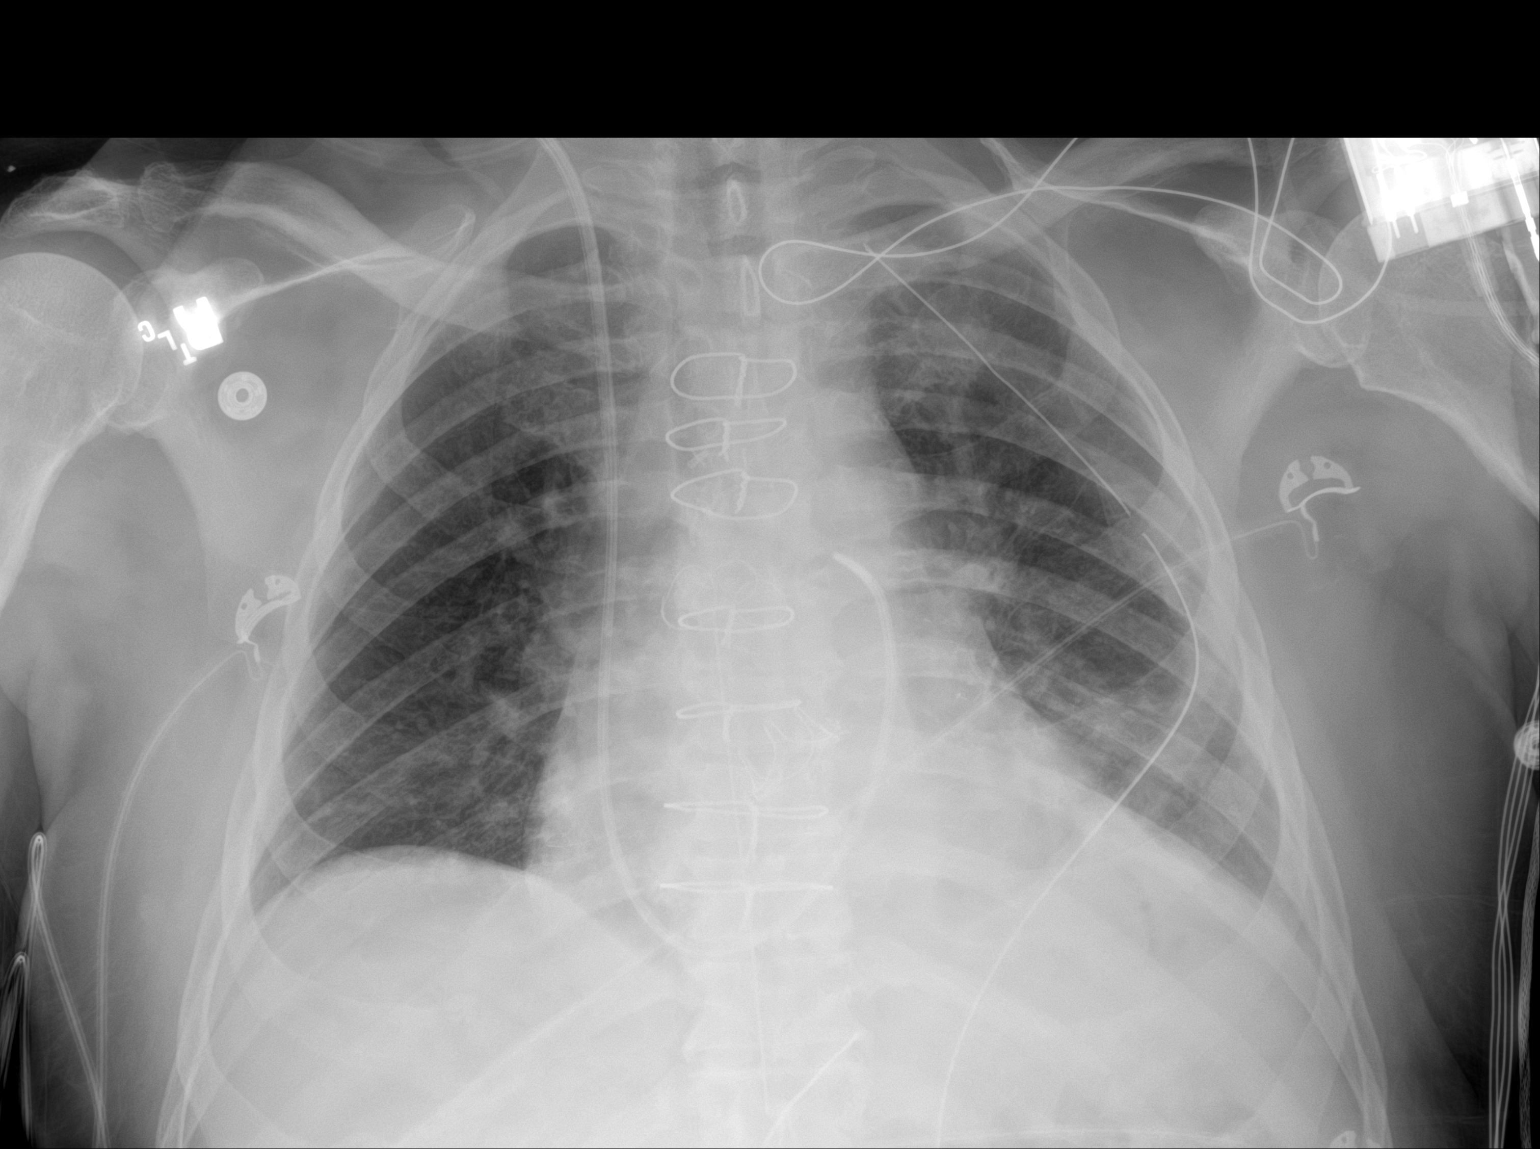

[1 of 1 positions shown; findings below may reference images not displayed]

FINDINGS: [DATE] a.m., 07/30/2021. There are intact median sternotomy sutures,
CABG changes and an aortic valve prosthesis, left chest tube with
tip in the apex, 2 mediastinal drains and right IJ Swan-Ganz
catheter with tip in the pulmonary outflow tract having been pulled
back from the right main pulmonary artery in the interval.

There is no visible pneumothorax. There is a small volume of left
pleural fluid or blood with overlying interval increased patchy
atelectasis or consolidation in the left base.

The remaining lungs clear. There is mild cardiomegaly without
findings of CHF or edema.
IMPRESSION: 1. Increased patchy consolidation or atelectasis in the left base
overlying increased small volume of left pleural fluid or blood.
2. No pneumothorax is seen with mediastinal drains and left chest
tube, all unchanged.
3. Swan-Ganz catheter interval pullback from the right main
pulmonary artery into the outflow tract.

## 2023-11-24 NOTE — Telephone Encounter (Signed)
 Left message to call back

## 2023-11-24 NOTE — Telephone Encounter (Signed)
 STAT if patient feels like he/she is going to faint   1. Are you feeling dizzy, lightheaded, or faint right now? yes    2. Have you passed out?   (If yes move to .SYNCOPECHMG) no   3. Do you have any other symptoms? No appetite   4. Have you checked your HR and BP (record if available)? Blood pressure is good- daughter wants patient to be seen asap please

## 2023-11-24 NOTE — Telephone Encounter (Signed)
   Pts dtr, Harlene, called this evening to report that pt has been feeling poorly lately, dizziness, sob, intermittent chest pain.  Recently saw PCP and was advised to f/u w/ cardiology.  Pt actively having c/p today.  I recommended that given his complex history and active symptoms, he will require ED evaluation.  Pts dtr expected as much but just wanted to confirm and is bringing him to the ED tonight.   Lonni Meager, NP 11/24/2023, 6:23 PM

## 2023-11-25 ENCOUNTER — Ambulatory Visit
Admission: RE | Admit: 2023-11-25 | Discharge: 2023-11-25 | Disposition: A | Discharge status: Home / Self Care | Source: Ambulatory Visit | Attending: Nurse Practitioner | Admitting: Nurse Practitioner

## 2023-11-25 DIAGNOSIS — I6782 Cerebral ischemia: Secondary | ICD-10-CM | POA: Diagnosis not present

## 2023-11-25 DIAGNOSIS — R519 Headache, unspecified: Secondary | ICD-10-CM

## 2023-11-25 IMAGING — DX DG CHEST 1V PORT
1 series · 1 of 1 positions shown · non-contrast
Comparison: Chest yesterday at [DATE] a.m.

CLINICAL DATA: CABG and aortic valve replacement. Follow-up lung
status.

EXAM:
PORTABLE CHEST 1 VIEW

[chest ap]
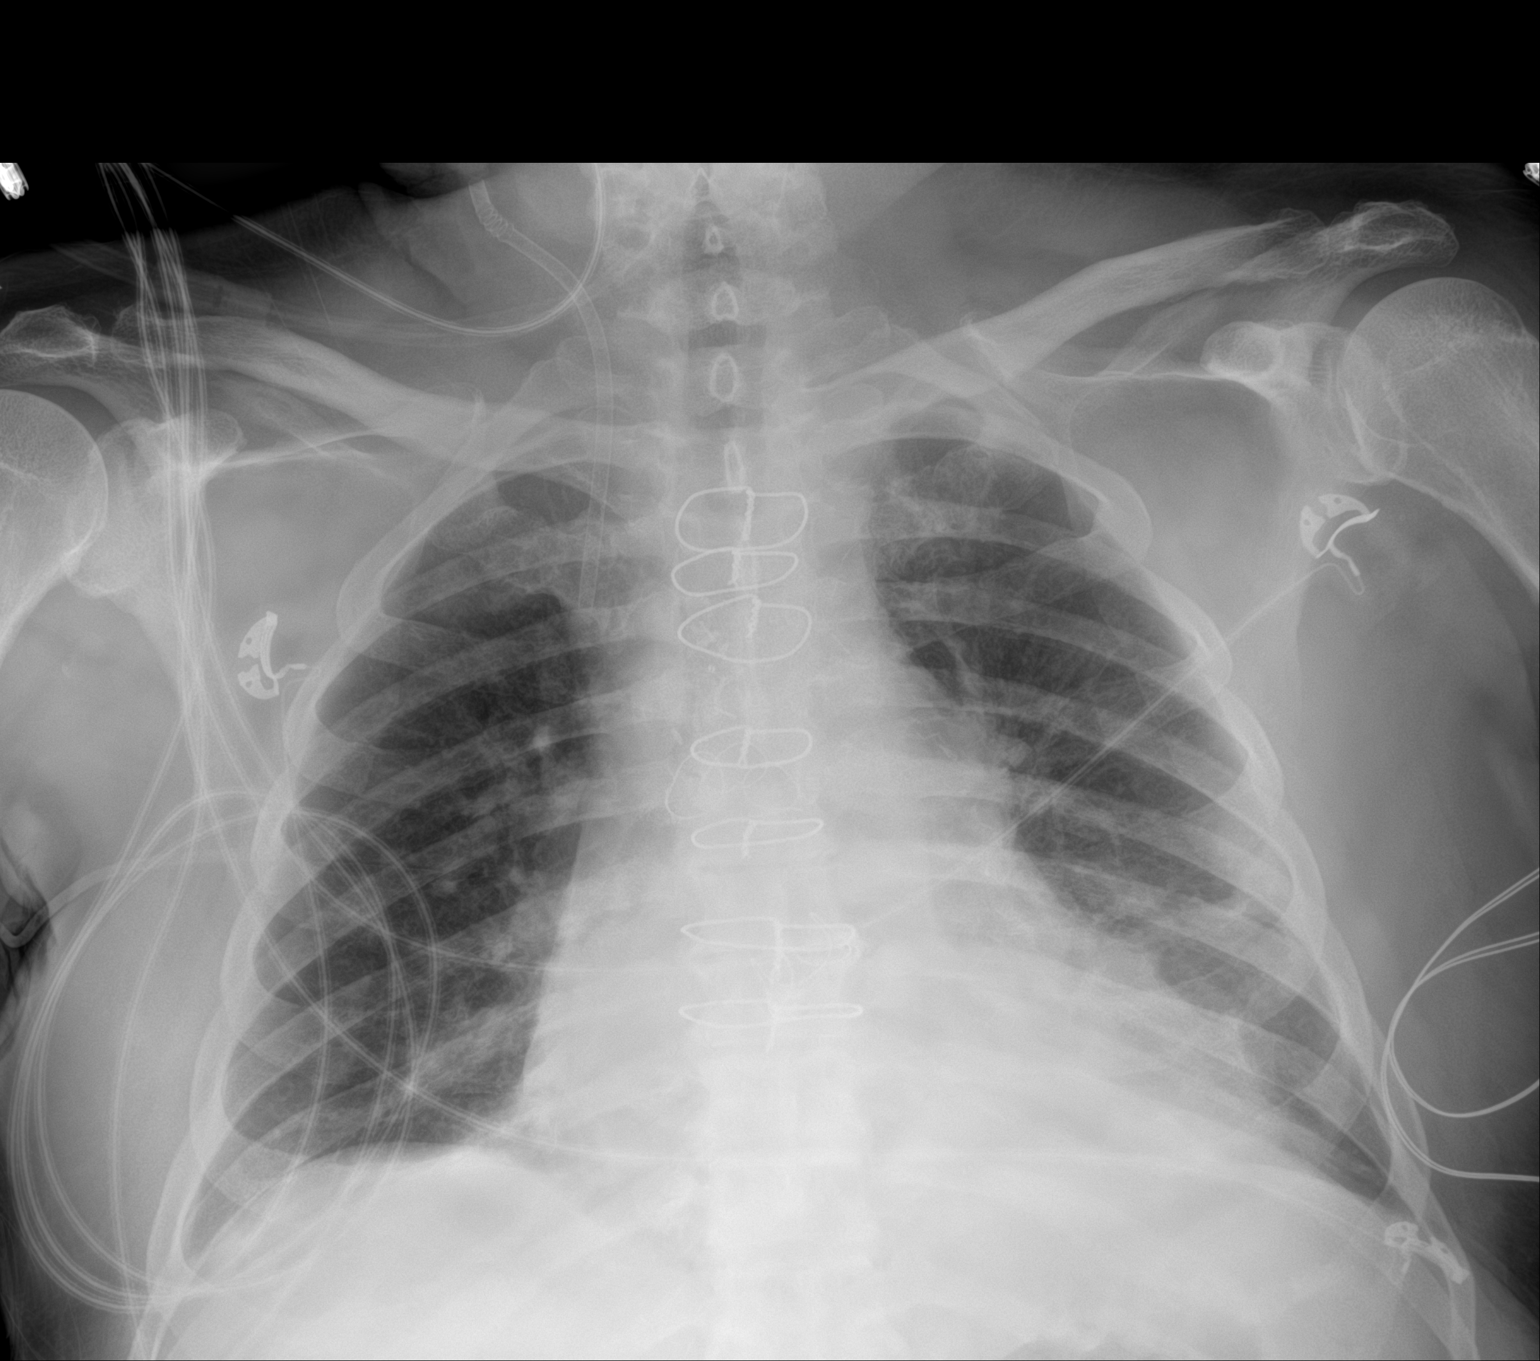

[1 of 1 positions shown; findings below may reference images not displayed]

FINDINGS: [DATE] a.m., 07/31/2021. Mediastinal drains and left chest tube have
been removed.

Interval removal Swan-Ganz catheter with right IJ catheter
introducer sheath remaining with the tip at the level of the
brachiocephalic/SVC junction.

There is no pneumothorax. There are stable CABG changes and aortic
valve replacement position.

Cardiac size is stable with no vascular congestion findings. Minimal
left pleural effusion shows improvement with persistent patchy
consolidation or atelectasis in the left lower lung field.

The remaining lungs clear. In all other respects no further changes.
IMPRESSION: 1. Improving left pleural fluid now minimal but persistent patchy
consolidation or atelectasis in the left base.
2. Interval removal left chest tube, mediastinal drains, Swan-Ganz
catheter with no appreciable pneumothorax.

## 2023-11-26 IMAGING — DX DG ABDOMEN 1V
1 series · 1 of 1 positions shown · non-contrast
Comparison: None.

CLINICAL DATA: Abdominal pain

EXAM:
ABDOMEN - 1 VIEW

[abdomen kub]
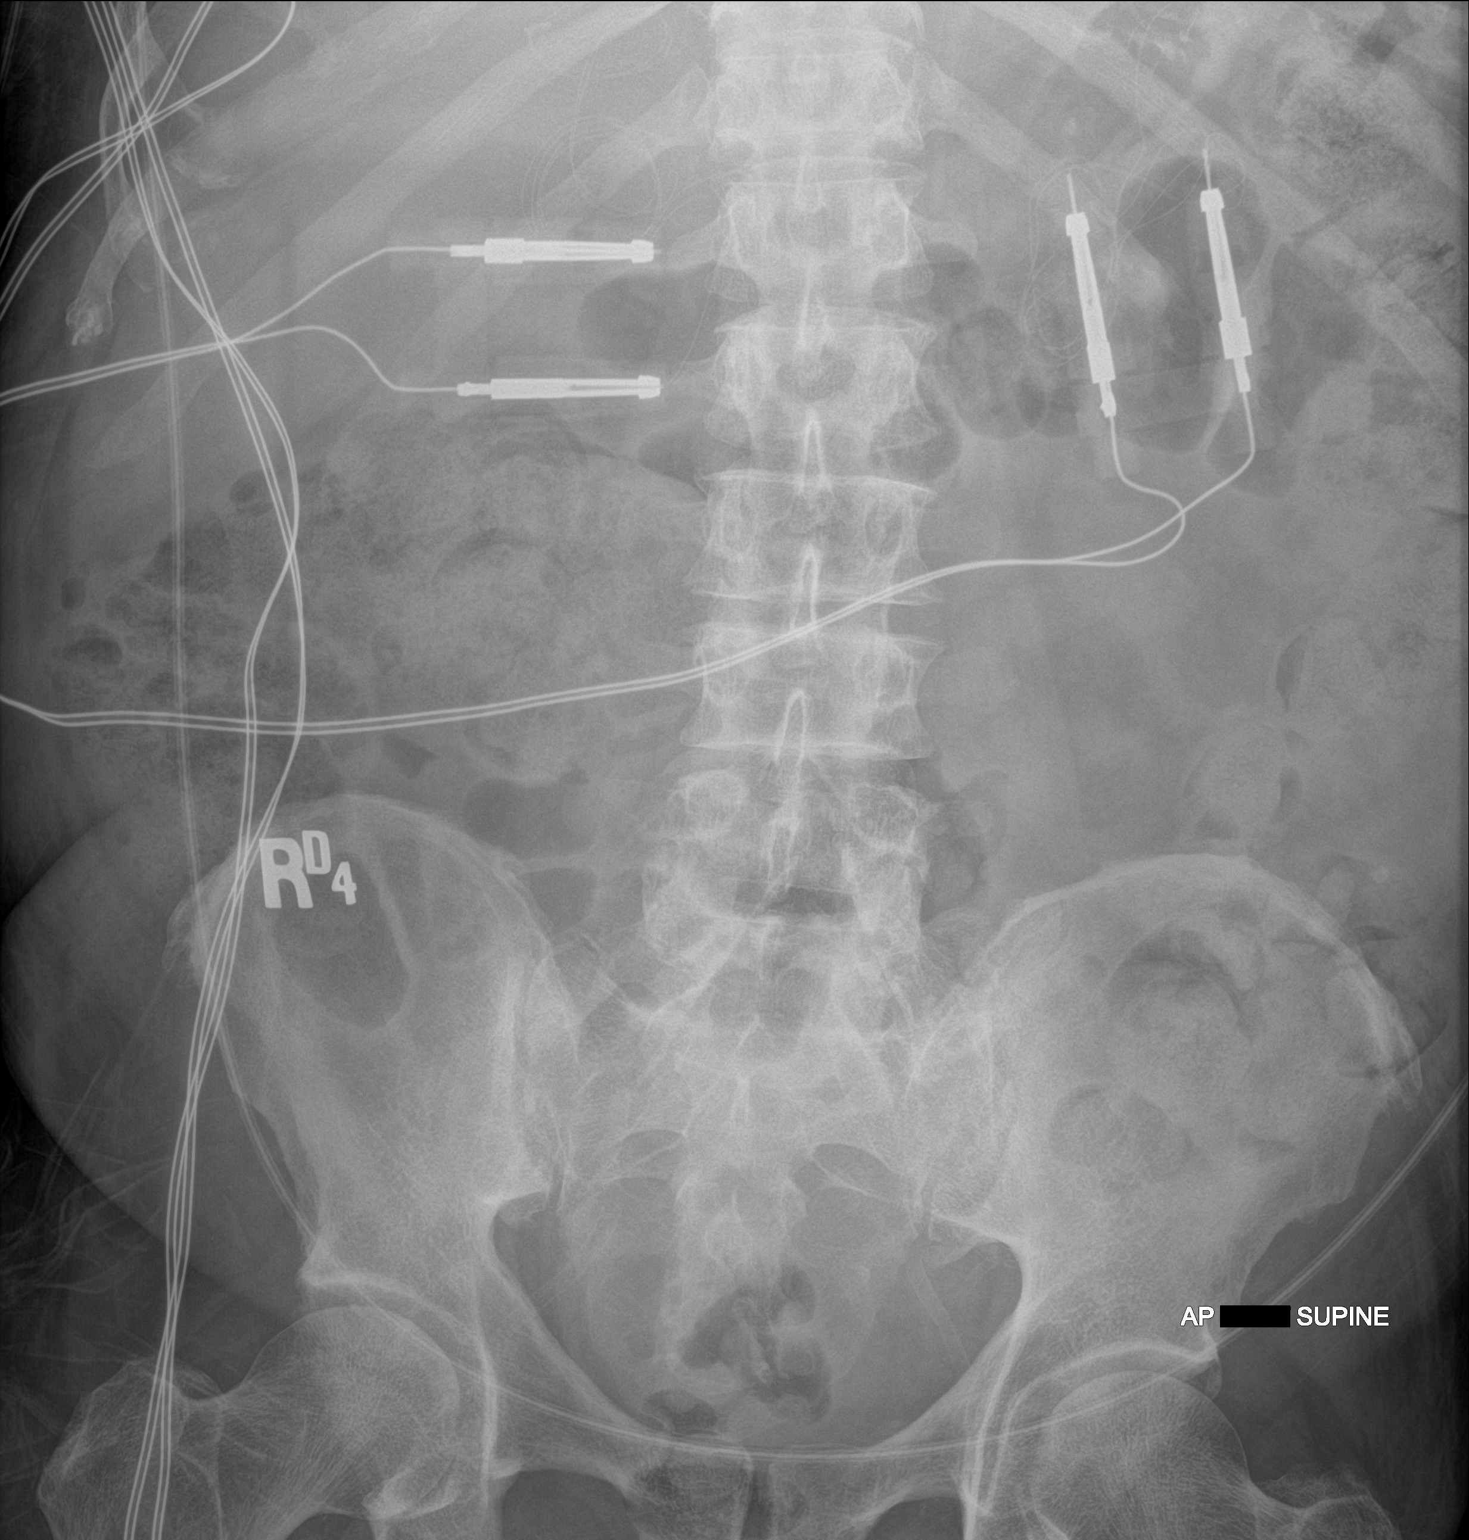

[1 of 1 positions shown; findings below may reference images not displayed]

FINDINGS: Bowel gas pattern is nonspecific. Moderate to large amount of stool
is seen in the ascending, transverse and descending colon. There is
no fecal impaction in the rectosigmoid. No definite calcific
densities seen. Kidneys are mostly obscured by bowel contents
limiting evaluation for small renal stones.
IMPRESSION: Nonspecific bowel gas pattern. Moderate to large amount of stool is
seen in the proximal colon without evidence of fecal impaction in
the rectosigmoid.

## 2023-11-30 ENCOUNTER — Telehealth: Payer: Self-pay | Admitting: *Deleted

## 2023-11-30 NOTE — Telephone Encounter (Signed)
 Unable to reach pt or leave a message mailbox is full. Do not see that he went to a local ER.

## 2023-11-30 NOTE — Telephone Encounter (Signed)
 Patient's daughter in law return calling. Per daughter in law someone called today and was not able to leave message.  Per daughter in law patient was not seen in ED. Per daughter in law, Harlene patient has been  lightheaded and dizzy. Patient daughter in law reports patient is hydrated and blood pressure and weight are good, but unable to give readings. Daughter in law would like to schedule an in office visit with a provide. Visit scheduled 8/21. Offered sooner appt ,daughter in law verbalized patient has other appointments with other providers and would like to keep appt scheduled. Advise daughter in law on ED precautions and advised her to take patient to Emergency. Per daughter in law patient is asymptomatic and she will take patient if she believes he needs to go. Appointment made for patient and made daughter in law  aware to call office for any questions. Understanding verbalized.

## 2023-12-03 DIAGNOSIS — E1159 Type 2 diabetes mellitus with other circulatory complications: Secondary | ICD-10-CM | POA: Diagnosis not present

## 2023-12-03 DIAGNOSIS — I152 Hypertension secondary to endocrine disorders: Secondary | ICD-10-CM | POA: Diagnosis not present

## 2023-12-03 DIAGNOSIS — E785 Hyperlipidemia, unspecified: Secondary | ICD-10-CM | POA: Diagnosis not present

## 2023-12-03 DIAGNOSIS — E039 Hypothyroidism, unspecified: Secondary | ICD-10-CM | POA: Diagnosis not present

## 2023-12-03 DIAGNOSIS — E1169 Type 2 diabetes mellitus with other specified complication: Secondary | ICD-10-CM | POA: Diagnosis not present

## 2023-12-03 DIAGNOSIS — E1122 Type 2 diabetes mellitus with diabetic chronic kidney disease: Secondary | ICD-10-CM | POA: Diagnosis not present

## 2023-12-03 DIAGNOSIS — N184 Chronic kidney disease, stage 4 (severe): Secondary | ICD-10-CM | POA: Diagnosis not present

## 2023-12-03 DIAGNOSIS — E1140 Type 2 diabetes mellitus with diabetic neuropathy, unspecified: Secondary | ICD-10-CM | POA: Diagnosis not present

## 2023-12-16 NOTE — Progress Notes (Unsigned)
 Cardiology Office Note    Patient Name: Antonio Cervantes Date of Encounter: 12/16/2023  Primary Care Provider:  [name], FNP Primary Cardiologist:  None Primary Electrophysiologist: None   Past Medical History    Past Medical History:  Diagnosis Date   Atrial fibrillation and flutter (HCC)    Blood transfusion without reported diagnosis    Cardiac arrhythmia due to congenital heart disease    Cataract    CHF (congestive heart failure) (HCC)    CKD (chronic kidney disease)    Diabetes mellitus without complication (HCC)    Diabetic peripheral neuropathy associated with type 2 diabetes mellitus (HCC)    Heart failure (HCC)    High blood cholesterol    Hypertension    Myocardial infarction (HCC)    08/06/2021   Thyroid  disease     History of Present Illness  Antonio Cervantes is a 68 y.o. male with a PMH of CAD s/p NSTEMI initially on 1/23 with medical therapy, NSTEMI 06/2021 with three-vessel disease, CABG x 4 with AVR, ICM, persistent AF, hypothyroidism, HFrEF, CKD stage IIIb,DM type II, HLD, GI bleed who presents today for 46-month follow-up.  Antonio Cervantes was last seen on 08/27/2023 and during visit he noted occasional dizziness with a plan to decrease Lasix  and or metoprolol .  He contacted our office on 11/24/2023 with need of shortness of breath, dizziness and intermittent chest pain.  He was advised to follow-up in the ED for further evaluation.  He contacted our office again on 8//25 and reported lightheadedness and dizziness.  Daughter-in-law stated that his blood pressures were stable and he was maintaining good hydration.  Antonio Cervantes presents today with his daughter-in-law and granddaughter with a complaint of worsening dizziness and fatigue. He experiences dizziness primarily when transitioning from sitting to standing, describing it as feeling 'dizzy' and 'unbalanced', particularly in the morning, which affects his ability to walk. He is currently taking metoprolol   and furosemide  20 mg on Monday, Wednesday, and Friday. He has a history of diabetes, with a recent A1c of 7.2. He experiences numbness and tingling in his hands and feet, and reports difficulty holding a pen due to weakness. He has not undergone a nerve conduction test. He reports pain in his feet and has had an ultrasound of his legs in March of the previous year. He is not currently on any medications like Neurontin for neuropathy. He drinks less than the recommended 64 ounces of water daily, which may contribute to his symptoms. He feels better after eating and maintains a good appetite. His blood sugars are well-controlled, with readings around 120-125. He has been seen by a diabetic specialist and a kidney doctor, with his kidney function being monitored. He has not had his thyroid  function checked recently. Patient denies chest pain, palpitations, dyspnea, PND, orthopnea, nausea, vomiting, dizziness, syncope, edema, weight gain, or early satiety.  Discussed the use of AI scribe software for clinical note transcription with the patient, who gave verbal consent to proceed.  History of Present Illness   Review of Systems  Please see the history of present illness.    All other systems reviewed and are otherwise negative except as noted above.  Physical Exam    Wt Readings from Last 3 Encounters:  09/24/23 178 lb (80.7 kg)  08/27/23 170 lb 12.8 oz (77.5 kg)  07/02/23 172 lb (78 kg)   CD:Uyzmz were no vitals filed for this visit.,There is no height or weight on file to calculate BMI. GEN: Well  nourished, well developed in no acute distress Neck: No JVD; No carotid bruits Pulmonary: Clear to auscultation without rales, wheezing or rhonchi  Cardiovascular: Normal rate. Regular rhythm. Normal S1. Normal S2.   Murmurs: There is no murmur.  ABDOMEN: Soft, non-tender, non-distended EXTREMITIES:  No edema; No deformity   EKG/LABS/ Recent Cardiac Studies   ECG personally reviewed by me today  -none completed today  Risk Assessment/Calculations:          Lab Results  Component Value Date   WBC 9.3 09/24/2023   HGB 11.2 (L) 09/24/2023   HCT 33.9 (L) 09/24/2023   MCV 94.3 09/24/2023   PLT 280 09/24/2023   Lab Results  Component Value Date   CREATININE 2.74 (H) 09/24/2023   BUN 55 (H) 09/24/2023   NA 134 (L) 09/24/2023   K 4.6 09/24/2023   CL 106 09/24/2023   CO2 22 09/24/2023   Lab Results  Component Value Date   CHOL 95 05/22/2022   HDL 26 (L) 05/22/2022   LDLCALC 53 05/22/2022   TRIG 81 05/22/2022   CHOLHDL 3.7 05/22/2022    Lab Results  Component Value Date   HGBA1C 7.6 (H) 07/11/2022   Assessment & Plan    Assessment & Plan  1.Dizziness and fatigue:  -likely due to low blood pressure, possibly exacerbated by metoprolol . - Reduce metoprolol  dosage from 25 mg to 12.5 mg, take at bedtime. - Encourage fluid intake to at least 64 ounces daily. - Consider compression stockings if symptoms persist. - We will check TSH today  2. HFrEF/ICM: -Most recent 2D echo completed on 12/28/2022 showing EF of 60 to 65% no RWMA and mild concentric LVH, mild MVR - Patient is euvolemic today with no lower extremity edema or shortness of breath noted. - Continue Farxiga  10 mg, Lasix  20 mg Monday, Wednesday, Friday  2. Coronary artery disease: -s/p NSTEMI 06/2021 with CABG x 4 completed 07/2021 elevated troponin during admission recent admission due to demand ischemia -Patient reports no chest pain or angina since previous follow-up. - Continue ASA 81 mg, as needed Nitrostat  0.4 mg, Toprol -XL 12.5 mg  3. Persistent AF: -Patient maintaining sinus rhythm - Continue Eliquis  5 mg twice daily - Patient's most recent hemoglobin was 11.3 and creatinine was 2.5  4.Nonrheumatic AVR: -s/p AVR repair 07/2021 -SBE prophylaxis discussed  -Good valve function on echocardiogram. No dysfunction symptoms. - Continue routine valve function monitoring  5. CKD stage IV: -Patient's most  recent creatinine was 2.4 - Continue current treatment plan per nephrology  6.Peripheral neuropathy and numbness: -Numbness in extremities likely due to diabetic neuropathy. Diabetes well-controlled with A1c of 7.2%. - Continue current diabetes management. - Patient will complete lower and upper extremity Doppler to rule out peripheral vascular disease  Disposition: Follow-up with None or APP in 3 months    Signed, Wyn Raddle, Jackee Shove, NP 12/16/2023, 5:33 PM [facility]

## 2023-12-17 ENCOUNTER — Ambulatory Visit: Attending: Nurse Practitioner | Admitting: Nurse Practitioner

## 2023-12-17 ENCOUNTER — Encounter: Payer: Self-pay | Admitting: Nurse Practitioner

## 2023-12-17 VITALS — BP 104/74 | HR 61 | Ht 66.0 in | Wt 165.2 lb

## 2023-12-17 DIAGNOSIS — I4819 Other persistent atrial fibrillation: Secondary | ICD-10-CM | POA: Diagnosis not present

## 2023-12-17 DIAGNOSIS — I350 Nonrheumatic aortic (valve) stenosis: Secondary | ICD-10-CM | POA: Diagnosis not present

## 2023-12-17 DIAGNOSIS — I5020 Unspecified systolic (congestive) heart failure: Secondary | ICD-10-CM

## 2023-12-17 DIAGNOSIS — N184 Chronic kidney disease, stage 4 (severe): Secondary | ICD-10-CM

## 2023-12-17 DIAGNOSIS — I2510 Atherosclerotic heart disease of native coronary artery without angina pectoris: Secondary | ICD-10-CM

## 2023-12-17 MED ORDER — METOPROLOL SUCCINATE ER 25 MG PO TB24: 12.5000 mg | ORAL_TABLET | Freq: Every day | ORAL | 1 refills | Status: DC

## 2023-12-17 NOTE — Patient Instructions (Addendum)
 Medication Instructions:  DECREASE Metoprolol  to 12.5mg  Take 1 tablet daily at bedtime  *If you need a refill on your cardiac medications before your next appointment, please call your pharmacy*  Lab Work: TODAY-TSH If you have labs (blood work) drawn today and your tests are completely normal, you will receive your results only by: MyChart Message (if you have MyChart) OR A paper copy in the mail If you have any lab test that is abnormal or we need to change your treatment, we will call you to review the results.  Testing/Procedures: Your physician has requested that you have a lower and upper extremity arterial duplex. This test is an ultrasound of the arteries in the legs or arms. It looks at arterial blood flow in the legs and arms. Allow one hour for Lower and Upper Arterial scans. There are no restrictions or special instructions.  Please note: We ask at that you not bring children with you during ultrasound (echo/ vascular) testing. Due to room size and safety concerns, children are not allowed in the ultrasound rooms during exams. Our front office staff cannot provide observation of children in our lobby area while testing is being conducted. An adult accompanying a patient to their appointment will only be allowed in the ultrasound room at the discretion of the ultrasound technician under special circumstances. We apologize for any inconvenience.  Follow-Up: At [facility], you and your health needs are our priority.  As part of our continuing mission to provide you with exceptional heart care, our providers are all part of one team.  This team includes your primary Cardiologist (physician) and Advanced Practice Providers or APPs (Physician Assistants and Nurse Practitioners) who all work together to provide you with the care you need, when you need it.  Your next appointment:   3 month(s)  Provider:   Dr Cherrie or CHF APP    We recommend signing up for the patient  portal called MyChart.  Sign up information is provided on this After Visit Summary.  MyChart is used to connect with patients for Virtual Visits (Telemedicine).  Patients are able to view lab/test results, encounter notes, upcoming appointments, etc.  Non-urgent messages can be sent to your provider as well.   To learn more about what you can do with MyChart, go to [email or web address].   Other Instructions DRINK AT LEAST 64 OUNCES OF FLUID PER DAY

## 2023-12-18 LAB — TSH: TSH: 2.1 u[IU]/mL (ref 0.450–4.500)

## 2023-12-20 ENCOUNTER — Ambulatory Visit: Payer: Self-pay | Admitting: Nurse Practitioner

## 2023-12-21 NOTE — Telephone Encounter (Deleted)
 Called the patient to review lab results. No answer and unable to leave a voicemail; mailbox is full.

## 2024-01-04 ENCOUNTER — Ambulatory Visit (HOSPITAL_COMMUNITY)
Admission: RE | Admit: 2024-01-04 | Discharge: 2024-01-04 | Disposition: A | Discharge status: Home / Self Care | Source: Ambulatory Visit | Attending: Nurse Practitioner | Admitting: Nurse Practitioner

## 2024-01-04 ENCOUNTER — Ambulatory Visit (HOSPITAL_COMMUNITY)

## 2024-01-04 DIAGNOSIS — I4819 Other persistent atrial fibrillation: Secondary | ICD-10-CM | POA: Diagnosis not present

## 2024-01-04 DIAGNOSIS — R200 Anesthesia of skin: Secondary | ICD-10-CM | POA: Diagnosis not present

## 2024-01-04 DIAGNOSIS — N184 Chronic kidney disease, stage 4 (severe): Secondary | ICD-10-CM | POA: Insufficient documentation

## 2024-01-04 DIAGNOSIS — I5020 Unspecified systolic (congestive) heart failure: Secondary | ICD-10-CM | POA: Diagnosis not present

## 2024-01-04 DIAGNOSIS — I2510 Atherosclerotic heart disease of native coronary artery without angina pectoris: Secondary | ICD-10-CM | POA: Diagnosis not present

## 2024-01-04 DIAGNOSIS — I350 Nonrheumatic aortic (valve) stenosis: Secondary | ICD-10-CM | POA: Insufficient documentation

## 2024-01-06 LAB — VAS US ABI WITH/WO TBI

## 2024-01-17 ENCOUNTER — Other Ambulatory Visit (HOSPITAL_COMMUNITY): Payer: Self-pay | Admitting: Physician Assistant

## 2024-01-17 DIAGNOSIS — I5020 Unspecified systolic (congestive) heart failure: Secondary | ICD-10-CM

## 2024-01-18 ENCOUNTER — Telehealth (HOSPITAL_COMMUNITY): Payer: Self-pay | Admitting: Cardiology

## 2024-01-18 NOTE — Telephone Encounter (Signed)
   Please call and instruct to call 911 for shortness of breath at rest and chest pain. Needs to go to ED for an evaluation.    Chayson Charters NP-C  11:31 AM

## 2024-01-18 NOTE — Telephone Encounter (Signed)
 Daughter aware.

## 2024-01-18 NOTE — Telephone Encounter (Signed)
 Patients daughter called with increasing concerns despite upcoming appointment 11/4    Reports chest pain and increased SOB  Weight today 174, normal weight ~164 B/p 86/60 HR 68 No swelling No missed does of medications Lasix  20 MWF SOB is continuous with minimal exertion  Chest pain is also off and on  NTG in the home however he has not take any    Please advise

## 2024-01-18 NOTE — Telephone Encounter (Signed)
 Attempted to contact daughter as requested-LMOM  -daughter number is also listed as patients contact  Will attempt later

## 2024-01-19 ENCOUNTER — Other Ambulatory Visit: Payer: Self-pay

## 2024-01-19 ENCOUNTER — Emergency Department (HOSPITAL_COMMUNITY)

## 2024-01-19 ENCOUNTER — Observation Stay (HOSPITAL_COMMUNITY)
Admission: EM | Admit: 2024-01-19 | Discharge: 2024-01-21 | Disposition: A | Discharge status: Home / Self Care | Attending: Internal Medicine | Admitting: Internal Medicine

## 2024-01-19 DIAGNOSIS — J90 Pleural effusion, not elsewhere classified: Secondary | ICD-10-CM | POA: Diagnosis not present

## 2024-01-19 DIAGNOSIS — E782 Mixed hyperlipidemia: Secondary | ICD-10-CM

## 2024-01-19 DIAGNOSIS — I2510 Atherosclerotic heart disease of native coronary artery without angina pectoris: Secondary | ICD-10-CM | POA: Insufficient documentation

## 2024-01-19 DIAGNOSIS — R0602 Shortness of breath: Secondary | ICD-10-CM | POA: Diagnosis not present

## 2024-01-19 DIAGNOSIS — J9811 Atelectasis: Secondary | ICD-10-CM | POA: Diagnosis not present

## 2024-01-19 DIAGNOSIS — D638 Anemia in other chronic diseases classified elsewhere: Secondary | ICD-10-CM | POA: Insufficient documentation

## 2024-01-19 DIAGNOSIS — E1140 Type 2 diabetes mellitus with diabetic neuropathy, unspecified: Secondary | ICD-10-CM | POA: Insufficient documentation

## 2024-01-19 DIAGNOSIS — I10 Essential (primary) hypertension: Secondary | ICD-10-CM | POA: Diagnosis present

## 2024-01-19 DIAGNOSIS — R0789 Other chest pain: Secondary | ICD-10-CM | POA: Diagnosis not present

## 2024-01-19 DIAGNOSIS — N184 Chronic kidney disease, stage 4 (severe): Secondary | ICD-10-CM | POA: Diagnosis not present

## 2024-01-19 DIAGNOSIS — R079 Chest pain, unspecified: Secondary | ICD-10-CM | POA: Diagnosis not present

## 2024-01-19 DIAGNOSIS — E785 Hyperlipidemia, unspecified: Secondary | ICD-10-CM | POA: Insufficient documentation

## 2024-01-19 DIAGNOSIS — K219 Gastro-esophageal reflux disease without esophagitis: Secondary | ICD-10-CM | POA: Diagnosis not present

## 2024-01-19 DIAGNOSIS — Z951 Presence of aortocoronary bypass graft: Secondary | ICD-10-CM | POA: Diagnosis not present

## 2024-01-19 DIAGNOSIS — Z7984 Long term (current) use of oral hypoglycemic drugs: Secondary | ICD-10-CM | POA: Insufficient documentation

## 2024-01-19 DIAGNOSIS — I130 Hypertensive heart and chronic kidney disease with heart failure and stage 1 through stage 4 chronic kidney disease, or unspecified chronic kidney disease: Secondary | ICD-10-CM | POA: Insufficient documentation

## 2024-01-19 DIAGNOSIS — I480 Paroxysmal atrial fibrillation: Secondary | ICD-10-CM | POA: Diagnosis not present

## 2024-01-19 DIAGNOSIS — R072 Precordial pain: Secondary | ICD-10-CM | POA: Diagnosis not present

## 2024-01-19 DIAGNOSIS — I5033 Acute on chronic diastolic (congestive) heart failure: Secondary | ICD-10-CM | POA: Diagnosis not present

## 2024-01-19 DIAGNOSIS — E119 Type 2 diabetes mellitus without complications: Secondary | ICD-10-CM

## 2024-01-19 DIAGNOSIS — R7989 Other specified abnormal findings of blood chemistry: Secondary | ICD-10-CM | POA: Diagnosis not present

## 2024-01-19 DIAGNOSIS — Z7901 Long term (current) use of anticoagulants: Secondary | ICD-10-CM | POA: Diagnosis not present

## 2024-01-19 DIAGNOSIS — I5031 Acute diastolic (congestive) heart failure: Secondary | ICD-10-CM | POA: Diagnosis present

## 2024-01-19 DIAGNOSIS — E1122 Type 2 diabetes mellitus with diabetic chronic kidney disease: Secondary | ICD-10-CM | POA: Insufficient documentation

## 2024-01-19 LAB — BASIC METABOLIC PANEL WITH GFR
Anion gap: 11 (ref 5–15)
BUN: 27 mg/dL — ABNORMAL HIGH (ref 8–23)
CO2: 17 mmol/L — ABNORMAL LOW (ref 22–32)
Calcium: 8.9 mg/dL (ref 8.9–10.3)
Chloride: 110 mmol/L (ref 98–111)
Creatinine, Ser: 2.09 mg/dL — ABNORMAL HIGH (ref 0.61–1.24)
GFR, Estimated: 34 mL/min — ABNORMAL LOW (ref >60)
Glucose, Bld: 128 mg/dL — ABNORMAL HIGH (ref 70–99)
Potassium: 4.5 mmol/L (ref 3.5–5.1)
Sodium: 138 mmol/L (ref 135–145)

## 2024-01-19 LAB — CBC
HCT: 31.5 % — ABNORMAL LOW (ref 39.0–52.0)
Hemoglobin: 10.3 g/dL — ABNORMAL LOW (ref 13.0–17.0)
MCH: 31.2 pg (ref 26.0–34.0)
MCHC: 32.7 g/dL (ref 30.0–36.0)
MCV: 95.5 fL (ref 80.0–100.0)
Platelets: 238 K/uL (ref 150–400)
RBC: 3.3 MIL/uL — ABNORMAL LOW (ref 4.22–5.81)
RDW: 13.6 % (ref 11.5–15.5)
WBC: 10.5 K/uL (ref 4.0–10.5)
nRBC: 0 % (ref 0.0–0.2)

## 2024-01-19 LAB — CBG MONITORING, ED: Glucose-Capillary: 134 mg/dL — ABNORMAL HIGH (ref 70–99)

## 2024-01-19 LAB — TROPONIN I (HIGH SENSITIVITY)
Troponin I (High Sensitivity): 18 ng/L — ABNORMAL HIGH (ref <18)
Troponin I (High Sensitivity): 17 ng/L (ref <18)

## 2024-01-19 LAB — BRAIN NATRIURETIC PEPTIDE: B Natriuretic Peptide: 363.2 pg/mL — ABNORMAL HIGH (ref 0.0–100.0)

## 2024-01-19 MED ORDER — ACETAMINOPHEN 325 MG PO TABS: 650.0000 mg | ORAL_TABLET | Freq: Four times a day (QID) | ORAL | Status: DC | PRN

## 2024-01-19 MED ORDER — AMLODIPINE BESYLATE 5 MG PO TABS
5.0000 mg | ORAL_TABLET | Freq: Every day | ORAL | Status: DC
  Administered 2024-01-19: 5 mg via ORAL
  Filled 2024-01-19: qty 1

## 2024-01-19 MED ORDER — NITROGLYCERIN 0.4 MG SL SUBL: 0.4000 mg | SUBLINGUAL_TABLET | SUBLINGUAL | Status: DC | PRN

## 2024-01-19 MED ORDER — ACETAMINOPHEN 650 MG RE SUPP: 650.0000 mg | Freq: Four times a day (QID) | RECTAL | Status: DC | PRN

## 2024-01-19 MED ORDER — APIXABAN 5 MG PO TABS
5.0000 mg | ORAL_TABLET | Freq: Two times a day (BID) | ORAL | Status: DC
Start: 2024-01-19 — End: 2024-01-21
  Administered 2024-01-19 – 2024-01-21 (×3): 5 mg via ORAL
  Filled 2024-01-19 (×3): qty 1

## 2024-01-19 MED ORDER — FUROSEMIDE 10 MG/ML IJ SOLN
20.0000 mg | Freq: Once | INTRAMUSCULAR | Status: AC
  Administered 2024-01-19: 20 mg via INTRAVENOUS
  Filled 2024-01-19: qty 2

## 2024-01-19 MED ORDER — MELATONIN 3 MG PO TABS: 3.0000 mg | ORAL_TABLET | Freq: Every evening | ORAL | Status: DC | PRN

## 2024-01-19 MED ORDER — ONDANSETRON HCL 4 MG/2ML IJ SOLN: 4.0000 mg | Freq: Four times a day (QID) | INTRAMUSCULAR | Status: DC | PRN

## 2024-01-19 MED ORDER — INSULIN ASPART 100 UNIT/ML IJ SOLN: 0.0000 [IU] | Freq: Every day | INTRAMUSCULAR | Status: DC

## 2024-01-19 MED ORDER — FUROSEMIDE 10 MG/ML IJ SOLN: 40.0000 mg | Freq: Once | INTRAMUSCULAR | Status: DC

## 2024-01-19 MED ORDER — INSULIN ASPART 100 UNIT/ML IJ SOLN: 0.0000 [IU] | Freq: Three times a day (TID) | INTRAMUSCULAR | Status: DC

## 2024-01-19 MED ORDER — LABETALOL HCL 200 MG PO TABS: 200.0000 mg | ORAL_TABLET | Freq: Two times a day (BID) | ORAL | Status: DC

## 2024-01-19 MED ORDER — LABETALOL HCL 100 MG PO TABS
100.0000 mg | ORAL_TABLET | Freq: Two times a day (BID) | ORAL | Status: DC
  Administered 2024-01-19 – 2024-01-21 (×3): 100 mg via ORAL
  Filled 2024-01-19 (×3): qty 1

## 2024-01-19 NOTE — ED Provider Notes (Signed)
 [place] EMERGENCY DEPARTMENT AT [facility] Provider Note   CSN: [id number] Arrival date & time: 01/19/24  9075     Patient presents with: Chest Pain and Shortness of Breath   Antonio Cervantes is a 68 y.o. male.   67 year old male DM, CHF, HTN, Afib on Eliquis  presents to the ED with a chief complaint of chest pain for several weeks. Patient reports ongoing left-sided sharp stabbing pain without any radiation.  This has been an ongoing problem for several weeks, according to daughter-in-law at the bedside, he did receive some nitroglycerin  today which did improve his pain.  He rates his pain about a 4 out of 10 at this time.  He also complains of feeling very fatigued, states that he cannot get from 1 side of his room to another without getting very winded.  According to daughter-in-law he has been to diabetic specialist, kidney doctor, heart care and has not had any idea what is causing his ongoing fatigue and shortness of breath.  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  According to daughter-in-law, his blood pressure still significantly elevated for him, he is usually on blood pressure that keeps it stable to make sure his heart is not working too hard.  In addition, they continue to feel burning in his face, hands feet.  He has had a weight gain of 10 to 12 pounds over the last couple weeks.  According to daughter he is also feeling very dizzy, this occurs with any type of movement and ambulation.  Does have a mild cough.  No leg swelling, no fevers, no abdominal pain.  The history is provided by the patient and a relative.  Chest Pain Pain location:  Substernal area Pain quality: sharp and stabbing   Pain radiates to:  Does not radiate Pain severity:  Mild Onset quality:  Sudden Duration:  3 weeks Timing:  Intermittent Progression:  Unchanged Chronicity:  New Relieved by:  Nitroglycerin  Worsened by:  Nothing Associated  symptoms: dizziness, fatigue, shortness of breath and weakness   Associated symptoms: no abdominal pain, no back pain, no fever, no nausea and no vomiting   Shortness of Breath Associated symptoms: chest pain   Associated symptoms: no abdominal pain, no fever and no vomiting        Prior to Admission medications   Medication Sig Start Date End Date Taking? Authorizing Provider  aspirin  EC 81 MG tablet Take 1 tablet (81 mg total) by mouth daily. Swallow whole. 01/04/23   Ghimire, Donalda HERO, MD  atorvastatin  (LIPITOR ) 80 MG tablet Take 1 tablet (80 mg total) by mouth daily. 05/24/21   Jillian Buttery, MD  dapagliflozin  propanediol (FARXIGA ) 10 MG TABS tablet Take 1 tablet (10 mg total) by mouth daily before breakfast. 09/05/22   Milford, Harlene HERO, FNP  ELIQUIS  5 MG TABS tablet TAKE 1 TABLET BY MOUTH TWICE A DAY Patient taking differently: Take 5 mg by mouth daily at 6 (six) AM. 09/04/23   Bensimhon, Toribio SAUNDERS, MD  famotidine  (PEPCID ) 20 MG tablet TAKE 1 TABLET BY MOUTH EVERYDAY AT BEDTIME 08/03/23   Soldatova, Liuba, MD  famotidine  (PEPCID ) 40 MG tablet Take 40 mg by mouth daily. 04/27/23   [provider]  furosemide  (LASIX ) 20 MG tablet TAKE 2 TABLETS (40 MG TOTAL) BY MOUTH DAILY. 07/27/23   Bensimhon, Toribio SAUNDERS, MD  furosemide  (LASIX ) 20 MG tablet Take 20 mg by mouth 3 (three) times a week. 07/02/21  [provider]  furosemide  (LASIX ) 20 MG tablet TAKE 1 TABLET BY MOUTH ON MONDAY WEDNESDAY AND FRIDAY 01/19/24   Colletta Manuelita Garre, PA-C  glipiZIDE (GLUCOTROL) 10 MG tablet Take 10 mg by mouth daily at 6 (six) AM. 11/09/23   [provider]  glipiZIDE (GLUCOTROL) 5 MG tablet Take 5 mg by mouth daily. 09/01/22   [provider]  indomethacin  (INDOCIN ) 25 MG capsule Take 25 mg by mouth daily as needed for mild pain or moderate pain. Patient not taking: Reported on 12/17/2023    [provider]  levothyroxine  (SYNTHROID ) 137 MCG tablet Take 137 mcg by mouth daily  before breakfast.    [provider]  metoprolol  succinate (TOPROL -XL) 25 MG 24 hr tablet Take 0.5 tablets (12.5 mg total) by mouth daily. 12/17/23   Wyn Jackee VEAR Mickey., NP  mupirocin  ointment (BACTROBAN ) 2 % Apply 1 Application topically 2 (two) times daily as needed. 05/14/23   [provider]  nitroGLYCERIN  (NITROSTAT ) 0.4 MG SL tablet Place 1 tablet (0.4 mg total) under the tongue every 5 (five) minutes as needed for chest pain. Patient not taking: Reported on 12/17/2023 06/05/23   Bensimhon, Toribio SAUNDERS, MD  omeprazole  (PRILOSEC) 40 MG capsule Take 40 mg twice daily for 4 weeks and then go down to once daily Patient taking differently: Take 40 mg by mouth daily. 09/30/23   Craig Alan SAUNDERS, PA-C    Allergies: Patient has no known allergies.    Review of Systems  Constitutional:  Positive for fatigue. Negative for chills and fever.  HENT:  Negative for sinus pressure.   Respiratory:  Positive for shortness of breath.   Cardiovascular:  Positive for chest pain.  Gastrointestinal:  Negative for abdominal pain, nausea and vomiting.  Genitourinary:  Negative for flank pain.  Musculoskeletal:  Negative for back pain.  Skin:  Negative for pallor and wound.  Neurological:  Positive for dizziness and weakness. Negative for seizures and syncope.  All other systems reviewed and are negative.   Updated Vital Signs BP (!) 174/75   Pulse 64   Temp 97.7 F (36.5 C) (Oral)   Resp 12   Ht 5' 3 (1.6 m)   Wt 77.1 kg   SpO2 98%   BMI 30.11 kg/m   Physical Exam Vitals and nursing note reviewed.  Constitutional:      Appearance: He is well-developed.  HENT:     Head: Normocephalic and atraumatic.  Cardiovascular:     Comments: No bilateral pitting edema.  No calf tenderness bilaterally. Pulmonary:     Effort: Pulmonary effort is normal.     Breath sounds: No decreased breath sounds.  Musculoskeletal:     Cervical back: Normal range of motion and neck supple.     Right lower  leg: No edema.     Left lower leg: No edema.  Skin:    General: Skin is warm and dry.  Neurological:     Mental Status: He is alert and oriented to person, place, and time.     (all labs ordered are listed, but only abnormal results are displayed) Labs Reviewed  BASIC METABOLIC PANEL WITH GFR - Abnormal; Notable for the following components:      Result Value   CO2 17 (*)    Glucose, Bld 128 (*)    BUN 27 (*)    Creatinine, Ser 2.09 (*)    GFR, Estimated 34 (*)    All other components within normal limits  CBC -  Abnormal; Notable for the following components:   RBC 3.30 (*)    Hemoglobin 10.3 (*)    HCT 31.5 (*)    All other components within normal limits  BRAIN NATRIURETIC PEPTIDE - Abnormal; Notable for the following components:   B Natriuretic Peptide 363.2 (*)    All other components within normal limits  TROPONIN I (HIGH SENSITIVITY) - Abnormal; Notable for the following components:   Troponin I (High Sensitivity) 18 (*)    All other components within normal limits  TROPONIN I (HIGH SENSITIVITY)    EKG: EKG Interpretation Date/Time:  Tuesday January 19 2024 09:35:06 EDT Ventricular Rate:  72 PR Interval:  214 QRS Duration:  96 QT Interval:  406 QTC Calculation: 444 R Axis:   21  Text Interpretation: Sinus rhythm with 1st degree A-V block Nonspecific ST abnormality Abnormal ECG When compared with ECG of 05-Jun-2023 11:55, No significant change since last tracing Confirmed by Patt Alm DEL [phone]) on 01/19/2024 4:17:33 PM  Radiology: [facility] Chest 2 View Result Date: 01/19/2024 CLINICAL DATA:  Intermittent chest pain and shortness of breath for 2 years. EXAM: CHEST - 2 VIEW COMPARISON:  December 28, 2022 FINDINGS: Multiple sternal wires and vascular clips are present. These are seen on the prior study. The heart size and mediastinal contours are within normal limits. Very mild atelectasis is suspected within the retrocardiac region of the left lung base. Very small  bilateral pleural effusions are seen. No pneumothorax is identified. The visualized skeletal structures are unremarkable. IMPRESSION: 1. Evidence of prior median sternotomy/CABG. 2. Very mild left basilar atelectasis. 3. Very small bilateral pleural effusions. Electronically Signed   By: Suzen Dials M.D.   On: 01/19/2024 10:31     Procedures   Medications Ordered in the ED  amLODipine  (NORVASC ) tablet 5 mg (5 mg Oral Given 01/19/24 1825)  labetalol  (NORMODYNE ) tablet 100 mg (has no administration in time range)  furosemide  (LASIX ) injection 20 mg (20 mg Intravenous Given 01/19/24 1649)  furosemide  (LASIX ) injection 20 mg (20 mg Intravenous Given 01/19/24 1845)                                    Medical Decision Making Amount and/or Complexity of Data Reviewed Labs: ordered. Radiology: ordered.  Risk Prescription drug management.    This patient presents to the ED for concern of chest pain, shortness of breath, this involves a number of treatment options, and is a complaint that carries with it a high risk of complications and morbidity.  The differential diagnosis includes ACS, CHF exacerbation, viral illness.   Co morbidities: Discussed in HPI   Brief History:  See HPI.  EMR reviewed including pt PMHx, past surgical history and past visits to ER.   See HPI for more details   Lab Tests:  I ordered and independently interpreted labs.  The pertinent results include:    CBC with no leukocytosis, hemoglobin is slightly decreased.  BMP with no electrolyte derangement.  Creatinine level is at his baseline, BUN is slightly elevated.  First troponin was slightly elevated at 18, recheck at 17.  Imaging Studies:  Chest x-ray showed: 1. Evidence of prior median sternotomy/CABG.  2. Very mild left basilar atelectasis.  3. Very small bilateral pleural effusions.   Cardiac Monitoring:  The patient was maintained on a cardiac monitor.  I personally viewed and interpreted  the cardiac monitored which showed an underlying rhythm of:  NSR EKG non-ischemic  Medicines ordered:  Patient received nitroglycerin  prior to arrival in the emergency department.  Consults:  I requested consultation with cardiology,  and discussed lab and imaging findings as well as pertinent plan - they evaluated patient and recommend Echo, duplex, please see his note.   Reevaluation:  After the interventions noted above I re-evaluated patient and found that they have :stayed the same  Social Determinants of Health:  The patient's social determinants of health were a factor in the care of this patient  Problem List / ED Course:  Patient presented to the emergency department with chest pain, shortness of breath has been ongoing for several weeks.  He is accompanied by his daughter-in-law who takes care of him, he did respond to nitroglycerin  which did help with his symptoms.  Blood work here CBC is unremarkable, BMP with elevated creatinine but he does have a history of CKD.  Troponin was mildly elevated, second 1 is better.  BNP was elevated.  Chest x-ray does show some pleural effusions consistent with likely volume overload.  He was given Lasix  20 mg, daughter-in-law does report a weight gain of 10 to 12 pounds over the last several days. He does have severe underlying cardiac history including an MI in 2021.  Currently followed by Dr. Odis Savant.  I did feel that it was warranted to have cardiology see patient. Due to patient's significant cardiac history I do feel that he warrants admission at this time.  Did discuss this case with cardiology who feels that hospitalist admission is warranted, he will need to obtain an echo, duplex while he is in the hospital.  This was communicated to him and his daughter-in-law at the bedside.  He remains hemodynamically stable at this time. Records reviewed by me, last echocardiogram in 2024 with an EF of 60 to 65%. Spoke to Dr. Marcene, appreciate  his assistance.  Patient is hemodynamically stable for admission and further workup.   Dispostion:  After consideration of the diagnostic results and the patients response to treatment, I feel that the patent would benefit from admission for further management of chest pain.    Portions of this note were generated with Scientist, clinical (histocompatibility and immunogenetics). Dictation errors may occur despite best attempts at proofreading.  Final diagnoses:  Shortness of breath  Chest pain, unspecified type    ED Discharge Orders     None          Maureen Broad, PA-C 01/19/24 1928    Patt Alm Macho, MD 01/19/24 [phone]

## 2024-01-19 NOTE — H&P (Signed)
 History and Physical      GUST EUGENE FMW:[id number] DOB: 05-Feb-1956 DOA: 01/19/2024; DOS: 01/19/2024  PCP: [name], FNP *** Patient coming from: home ***  I have personally briefly reviewed patient's old medical records in [facility]  Chief Complaint: ***  HPI: EZIO WIECK is a 68 y.o. male with medical history significant for *** who is admitted to [facility] on 01/19/2024 with *** after presenting from home*** to [facility] ED complaining of ***.    ***       ***   ED Course:  Vital signs in the ED were notable for the following: ***  Labs were notable for the following: ***  Per my interpretation, EKG in ED demonstrated the following:  ***  Imaging in the ED, per corresponding formal radiology read, was notable for the following:  ***  While in the ED, the following were administered: ***  Subsequently, the patient was admitted  ***  ***red    Review of Systems: As per HPI otherwise 10 point review of systems negative.   Past Medical History:  Diagnosis Date   Atrial fibrillation and flutter (HCC)    Blood transfusion without reported diagnosis    Cardiac arrhythmia due to congenital heart disease    Cataract    CHF (congestive heart failure) (HCC)    CKD (chronic kidney disease)    Diabetes mellitus without complication (HCC)    Diabetic peripheral neuropathy associated with type 2 diabetes mellitus (HCC)    Heart failure (HCC)    High blood cholesterol    Hypertension    Myocardial infarction (HCC)    08/06/2021   Thyroid  disease     Past Surgical History:  Procedure Laterality Date   AORTIC VALVE REPLACEMENT N/A 07/29/2021   Procedure: AORTIC VALVE REPLACEMENT (AVR) USING EDWARDS INSPIRIS AORTIC VALVE SIZE ;  Surgeon: Lucas Dorise POUR, MD;  Location[facility] OR;  Service: Open Heart Surgery;  Laterality: N/A;   BIOPSY  12/30/2022   Procedure: BIOPSY;  Surgeon: Leigh Elspeth SQUIBB, MD;  Location: [facility] ENDOSCOPY;   Service: Gastroenterology;;   COLONOSCOPY N/A 12/30/2022   Procedure: COLONOSCOPY;  Surgeon: Leigh Elspeth SQUIBB, MD;  Location: [facility] ENDOSCOPY;  Service: Gastroenterology;  Laterality: N/A;   CORONARY ARTERY BYPASS GRAFT N/A 07/29/2021   Procedure: CORONARY ARTERY BYPASS GRAFTING (CABG) TIMES FOUR, USING LEFT INTERNAL MAMMARY ARTERY AND RIGHT GREATER SAPHENOUS VEIN HARVESTED ENDOSCOPICALLYY.;  Surgeon: Lucas Dorise POUR, MD;  Location: [facility] OR;  Service: Open Heart Surgery;  Laterality: N/A;   ESOPHAGOGASTRODUODENOSCOPY N/A 12/30/2022   Procedure: ESOPHAGOGASTRODUODENOSCOPY (EGD);  Surgeon: Leigh Elspeth SQUIBB, MD;  Location: [facility] ENDOSCOPY;  Service: Gastroenterology;  Laterality: N/A;   fatty tumar removal     On back   HEMOSTASIS CLIP PLACEMENT  12/30/2022   Procedure: HEMOSTASIS CLIP PLACEMENT;  Surgeon: Leigh Elspeth SQUIBB, MD;  Location: [facility] ENDOSCOPY;  Service: Gastroenterology;;   RIGHT/LEFT HEART CATH AND CORONARY ANGIOGRAPHY N/A 05/21/2021   Procedure: RIGHT/LEFT HEART CATH AND CORONARY ANGIOGRAPHY;  Surgeon: Dann Candyce RAMAN, MD;  Location: [facility] INVASIVE CV LAB;  Service: Cardiovascular;  Laterality: N/A;   TEE WITHOUT CARDIOVERSION N/A 07/29/2021   Procedure: TRANSESOPHAGEAL ECHOCARDIOGRAM (TEE);  Surgeon: Lucas Dorise POUR, MD;  Location: [facility] OR;  Service: Open Heart Surgery;  Laterality: N/A;    Social History:  reports that he quit smoking about 42 years ago. His smoking use included cigarettes. He started smoking about 57 years ago. He has a 22.5 pack-year smoking history. He  has never used smokeless tobacco. He reports that he does not drink alcohol and does not use drugs.   No Known Allergies  Family History  Problem Relation Age of Onset   Diabetes Mother    Diabetes Father    Throat cancer Brother    Heart attack Maternal Uncle    Sudden Cardiac Death Neg Hx    Liver disease Neg Hx    Colon cancer Neg Hx    Colon polyps Neg Hx    Esophageal cancer Neg Hx    Stomach  cancer Neg Hx    Rectal cancer Neg Hx     Family history reviewed and not pertinent ***   Prior to Admission medications   Medication Sig Start Date End Date Taking? Authorizing Provider  levothyroxine  (SYNTHROID ) 137 MCG tablet Take 137 mcg by mouth at bedtime.   Yes [provider]  aspirin  EC 81 MG tablet Take 1 tablet (81 mg total) by mouth daily. Swallow whole. 01/04/23   Ghimire, Donalda HERO, MD  atorvastatin  (LIPITOR ) 80 MG tablet Take 1 tablet (80 mg total) by mouth daily. 05/24/21   Jillian Buttery, MD  dapagliflozin  propanediol (FARXIGA ) 10 MG TABS tablet Take 1 tablet (10 mg total) by mouth daily before breakfast. 09/05/22   Milford, Harlene HERO, FNP  ELIQUIS  5 MG TABS tablet TAKE 1 TABLET BY MOUTH TWICE A DAY Patient taking differently: Take 5 mg by mouth daily at 6 (six) AM. 09/04/23   Bensimhon, Toribio SAUNDERS, MD  famotidine  (PEPCID ) 20 MG tablet TAKE 1 TABLET BY MOUTH EVERYDAY AT BEDTIME 08/03/23   Soldatova, Liuba, MD  furosemide  (LASIX ) 20 MG tablet TAKE 2 TABLETS (40 MG TOTAL) BY MOUTH DAILY. 07/27/23   Bensimhon, Toribio SAUNDERS, MD  furosemide  (LASIX ) 20 MG tablet TAKE 1 TABLET BY MOUTH ON MONDAY WEDNESDAY AND FRIDAY 01/19/24   Colletta Manuelita Garre, PA-C  glipiZIDE (GLUCOTROL) 10 MG tablet Take 10 mg by mouth daily at 6 (six) AM. 11/09/23   [provider]  indomethacin  (INDOCIN ) 25 MG capsule Take 25 mg by mouth daily as needed for mild pain or moderate pain. Patient not taking: Reported on 12/17/2023    [provider]  metoprolol  succinate (TOPROL -XL) 25 MG 24 hr tablet Take 0.5 tablets (12.5 mg total) by mouth daily. 12/17/23   Wyn Jackee VEAR Mickey., NP  mupirocin  ointment (BACTROBAN ) 2 % Apply 1 Application topically 2 (two) times daily as needed. 05/14/23   [provider]  nitroGLYCERIN  (NITROSTAT ) 0.4 MG SL tablet Place 1 tablet (0.4 mg total) under the tongue every 5 (five) minutes as needed for chest pain. Patient not taking: Reported on 12/17/2023 06/05/23    Bensimhon, Toribio SAUNDERS, MD  omeprazole  (PRILOSEC) 40 MG capsule Take 40 mg twice daily for 4 weeks and then go down to once daily Patient taking differently: Take 40 mg by mouth daily. 09/30/23   Craig Alan SAUNDERS, PA-C     Objective    Physical Exam: Vitals:   01/19/24 1745 01/19/24 1800 01/19/24 1825 01/19/24 1830  BP: (!) 162/106 (!) 170/78 (!) 161/73 (!) 174/75  Pulse: 69 (!) 59  64  Resp: 18 13  12   Temp:      TempSrc:      SpO2: 100% 97%  98%  Weight:      Height:        General: appears to be stated age; alert, oriented Skin: warm, dry, no rash Head:  AT/ Mouth:  Oral mucosa membranes appear moist,  normal dentition Neck: supple; trachea midline Heart:  RRR; did not appreciate any M/R/G Lungs: CTAB, did not appreciate any wheezes, rales, or rhonchi Abdomen: + BS; soft, ND, NT Vascular: 2+ pedal pulses b/l; 2+ radial pulses b/l Extremities: no peripheral edema, no muscle wasting Neuro: strength and sensation intact in upper and lower extremities b/l ***   *** Neuro: 5/5 strength of the proximal and distal flexors and extensors of the upper and lower extremities bilaterally; sensation intact in upper and lower extremities b/l; cranial nerves II through XII grossly intact; no pronator drift; no evidence suggestive of slurred speech, dysarthria, or facial droop; Normal muscle tone. No tremors.  *** Neuro: In the setting of the patient's current mental status and associated inability to follow instructions, unable to perform full neurologic exam at this time.  As such, assessment of strength, sensation, and cranial nerves is limited at this time. Patient noted to spontaneously move all 4 extremities. No tremors.  ***    Labs on Admission: I have personally reviewed following labs and imaging studies  CBC: Recent Labs  Lab 01/19/24 0938  WBC 10.5  HGB 10.3*  HCT 31.5*  MCV 95.5  PLT 238   Basic Metabolic Panel: Recent Labs  Lab 01/19/24 0938  NA 138  K 4.5   CL 110  CO2 17*  GLUCOSE 128*  BUN 27*  CREATININE 2.09*  CALCIUM  8.9   GFR: Estimated Creatinine Clearance: 31.1 mL/min (A) (by C-G formula based on SCr of 2.09 mg/dL (H)). Liver Function Tests: No results for input(s): AST, ALT, ALKPHOS, BILITOT, PROT, ALBUMIN  in the last 168 hours. No results for input(s): LIPASE, AMYLASE in the last 168 hours. No results for input(s): AMMONIA in the last 168 hours. Coagulation Profile: No results for input(s): INR, PROTIME in the last 168 hours. Cardiac Enzymes: No results for input(s): CKTOTAL, CKMB, CKMBINDEX, TROPONINI in the last 168 hours. BNP (last 3 results) No results for input(s): PROBNP in the last 8760 hours. HbA1C: No results for input(s): HGBA1C in the last 72 hours. CBG: No results for input(s): GLUCAP in the last 168 hours. Lipid Profile: No results for input(s): CHOL, HDL, LDLCALC, TRIG, CHOLHDL, LDLDIRECT in the last 72 hours. Thyroid  Function Tests: No results for input(s): TSH, T4TOTAL, FREET4, T3FREE, THYROIDAB in the last 72 hours. Anemia Panel: No results for input(s): VITAMINB12, FOLATE, FERRITIN, TIBC, IRON, RETICCTPCT in the last 72 hours. Urine analysis:    Component Value Date/Time   COLORURINE STRAW (A) 07/28/2021 2100   APPEARANCEUR CLEAR 07/28/2021 2100   LABSPEC 1.010 07/28/2021 2100   PHURINE 5.0 07/28/2021 2100   GLUCOSEU >=500 (A) 07/28/2021 2100   HGBUR NEGATIVE 07/28/2021 2100   BILIRUBINUR NEGATIVE 07/28/2021 2100   KETONESUR NEGATIVE 07/28/2021 2100   PROTEINUR NEGATIVE 07/28/2021 2100   NITRITE NEGATIVE 07/28/2021 2100   LEUKOCYTESUR NEGATIVE 07/28/2021 2100    Radiological Exams on Admission: DG Chest 2 View Result Date: 01/19/2024 CLINICAL DATA:  Intermittent chest pain and shortness of breath for 2 years. EXAM: CHEST - 2 VIEW COMPARISON:  December 28, 2022 FINDINGS: Multiple sternal wires and vascular clips are present.  These are seen on the prior study. The heart size and mediastinal contours are within normal limits. Very mild atelectasis is suspected within the retrocardiac region of the left lung base. Very small bilateral pleural effusions are seen. No pneumothorax is identified. The visualized skeletal structures are unremarkable. IMPRESSION: 1. Evidence of prior median sternotomy/CABG. 2. Very mild left basilar atelectasis. 3. Very small  bilateral pleural effusions. Electronically Signed   By: Suzen Dials M.D.   On: 01/19/2024 10:31      Assessment/Plan   Principal Problem:   Chest pain   ***            ***                  ***                   ***                  ***                  ***                  ***                   ***                  ***                  ***                  ***                  ***                 ***                ***  DVT prophylaxis: SCD's ***  Code Status: Full code*** Family Communication: none*** Disposition Plan: Per Rounding Team Consults called: none***;  Admission status: ***     I SPENT GREATER THAN 75 *** MINUTES IN CLINICAL CARE TIME/MEDICAL DECISION-MAKING IN COMPLETING THIS ADMISSION.      Eva NOVAK Lameka Disla DO [facility]  From 7PM - 7AM   01/19/2024, 7:46 PM   ***

## 2024-01-19 NOTE — ED Notes (Signed)
 Pt back in lobby with family.

## 2024-01-19 NOTE — Progress Notes (Incomplete)
 68 y/o male w/hypertension, type 2 DM, CAD s/p s/p CABG x 4, LIMA to LAD, SVG to diagonal, SVG to OM, SVG to distal RCA and AVR), ischemic cardiomyopathy w/recovered LVEF, paroxysmal Afib, nicotine dependence  Patient presented with dizziness, shortness of breath, breif sharp chest pains. Blood pressure generally low normal, but noe very high.   Physical Exam Vitals and nursing note reviewed.  Constitutional:      General: He is not in acute distress. Neck:     Vascular: JVD present.  Cardiovascular:     Rate and Rhythm: Normal rate and regular rhythm.     Heart sounds: Normal heart sounds. No murmur heard. Pulmonary:     Effort: Pulmonary effort is normal.     Breath sounds: Examination of the left-lower field reveals rales. Rales present. No wheezing.  Musculoskeletal:     Right lower leg: No edema.     Left lower leg: No edema.      EKG 01/19/2024: Sinus rhythm 72 bpm First degree AV block Nonspecific ST-T abnormality  Trop HS 18, 17 BNP 363  Echocardiogram 12/2022:  1. Left ventricular ejection fraction, by estimation, is 60 to 65%. The  left ventricle has normal function. The left ventricle has no regional  wall motion abnormalities. There is mild concentric left ventricular  hypertrophy. Left ventricular diastolic  parameters were normal.   2. Right ventricular systolic function is normal. The right ventricular  size is normal. Tricuspid regurgitation signal is inadequate for assessing  PA pressure.   3. The mitral valve is degenerative. Mild mitral valve regurgitation.   4. The aortic valve has been repaired/replaced. Aortic valve  regurgitation is not visualized. There is a 23 mm Edwards Inspiris Resilia  pericardial valve present in the aortic position. Aortic valve mean  gradient measures 12.0 mmHg.   5. The inferior vena cava is normal in size with <50% respiratory  variability, suggesting right atrial pressure of 8 mmHg.   Comparison(s): Prior images reviewed  side by side. LVEF normal range at  60-65%. Pericardial AVR with grossly normal function (dimentionless index  0.61), no aortic regurgitation. mean AV gradient has increased from 6-12  mmHg however since prior study.   Shortness of breath: Mild volume overload on exam, acute on chronic HFpEF. Recommend IV lasix  40 mg once. Hypertension is new to him. Management limited by CKD. Recommend amlodipine  5 mg daily and labetalol  100 mg bid for now. Check renal artery duplex to r/o renal artery stenosis given the sudden jump. Check echocardiogram.  CAD: Mild trop likely due to heart failure and hypertension. Do not suspect ACS.   Newman JINNY Lawrence, MD

## 2024-01-19 NOTE — Consult Note (Signed)
 Cardiology Consultation   Patient ID: Antonio Cervantes MRN: [id number]; DOB: 1956/03/26  Admit date: 01/19/2024 Date of Consult: 01/19/2024  PCP:  Antonio Dwayne NOVAK, FNP   [facility] Providers Cardiologist:  None  Advanced Heart Failure:  Toribio Fuel, MD       Patient Profile: Antonio Cervantes is a 68 y.o. male with a hx of PMH of CAD s/p NSTEMI initially on 1/23 with medical therapy, NSTEMI 06/2021 with three-vessel disease, CABG x 4 with AVR, ICM, persistent AF, hypothyroidism, HFrEF, CKD stage IIIb,DM type II, HLD, GI bleed, who is being seen 01/19/2024 for the evaluation of chest pain and SOB, at the request of Dr Patt.  History of Present Illness: Mr. Niehoff was seen on 08/21.  During a previous visit he had noted occasional dizziness with a plan to decrease Lasix  and or metoprolol .  He contacted our office on 11/24/2023 with c/o shortness of breath, dizziness and intermittent chest pain.  He was advised to follow-up in the ED for further evaluation but did not.    When Mr. Mannie was seen on 12/17/2023, his metoprolol  XL dose was reduced to 12.5 mg and take at bedtime. Drink 64 oz fluid daily. TSH checked and was normal. Continue Lasix  20 mg M-W-F.   Dtr called the office 09/22 c/o BP 86/60, off/on chest pain, SOB w/ minimal exertion. Advised to call 911.  Pt came to ER this am c/o CP/SOB. Was hypertensive w/ peak BP 227/93. Got Lasix  20 mg IV this pm. Cards asked to see.   Mr. Durrell was doing well until a couple of months ago.  His systolic blood pressure is chronically in the 80s.  A couple of months ago, he developed some dyspnea on exertion despite maintaining his weight and dizziness/lightheadedness, worse on standing.  His medications were titrated down, but his symptoms did not improve.  Yesterday, he developed sharp chest pain.  The pains were intermittent, but severe with a cane.  They were brief and went away within 10 seconds.  He had multiple  pains yesterday, and took a nitroglycerin  sublingual with some improvement.  Yesterday during the chest pain and after the nitroglycerin , his systolic blood pressure was in the 80s, stable.  Yesterday, his weight jumped up by 10 pounds.  According to the patient, his normal weight is 164 and yesterday it was 174.  He took his Monday Wednesday Friday Lasix  20 mg last p.m. as usual.  In reviewing p.o. intake, the patient does not drink much water at all but he will drink a 12 ounce Dr. Nunzio and some low sugar Kool-Aid.  The total is probably less than 32 ounces a day.  He has not eaten out, food is cooked by the family.  They are clear that there has been no dietary indiscretion with salt.  Today, his weight was no different, his shortness of breath on exertion was significant and his chest pain was occurring pretty frequently.  He called the office and came to the ER as directed.  While this writer was with him, he did not have any episodes of chest pain, but seemed to get short of breath with conversation.  On arrival to the ER, his blood pressure was 227/93.  This is a big surprise to the family as they had no idea his blood pressure was so high.  It has remained high in the emergency room.  He does not have any issues with double vision or headache.   Past Medical  History:  Diagnosis Date   Atrial fibrillation and flutter (HCC)    Blood transfusion without reported diagnosis    Cardiac arrhythmia due to congenital heart disease    Cataract    CHF (congestive heart failure) (HCC)    CKD (chronic kidney disease)    Diabetes mellitus without complication (HCC)    Diabetic peripheral neuropathy associated with type 2 diabetes mellitus (HCC)    Heart failure (HCC)    High blood cholesterol    Hypertension    Myocardial infarction (HCC)    08/06/2021   Thyroid  disease     Past Surgical History:  Procedure Laterality Date   AORTIC VALVE REPLACEMENT N/A 07/29/2021   Procedure: AORTIC  VALVE REPLACEMENT (AVR) USING EDWARDS INSPIRIS AORTIC VALVE SIZE ;  Surgeon: Lucas Dorise POUR, MD;  Location: [facility] OR;  Service: Open Heart Surgery;  Laterality: N/A;   BIOPSY  12/30/2022   Procedure: BIOPSY;  Surgeon: Leigh Elspeth SQUIBB, MD;  Location: [facility] ENDOSCOPY;  Service: Gastroenterology;;   COLONOSCOPY N/A 12/30/2022   Procedure: COLONOSCOPY;  Surgeon: Leigh Elspeth SQUIBB, MD;  Location: [facility] ENDOSCOPY;  Service: Gastroenterology;  Laterality: N/A;   CORONARY ARTERY BYPASS GRAFT N/A 07/29/2021   Procedure: CORONARY ARTERY BYPASS GRAFTING (CABG) TIMES FOUR, USING LEFT INTERNAL MAMMARY ARTERY AND RIGHT GREATER SAPHENOUS VEIN HARVESTED ENDOSCOPICALLYY.;  Surgeon: Lucas Dorise POUR, MD;  Location: [facility] OR;  Service: Open Heart Surgery;  Laterality: N/A;   ESOPHAGOGASTRODUODENOSCOPY N/A 12/30/2022   Procedure: ESOPHAGOGASTRODUODENOSCOPY (EGD);  Surgeon: Leigh Elspeth SQUIBB, MD;  Location: [facility] ENDOSCOPY;  Service: Gastroenterology;  Laterality: N/A;   fatty tumar removal     On back   HEMOSTASIS CLIP PLACEMENT  12/30/2022   Procedure: HEMOSTASIS CLIP PLACEMENT;  Surgeon: Leigh Elspeth SQUIBB, MD;  Location: [facility] ENDOSCOPY;  Service: Gastroenterology;;   RIGHT/LEFT HEART CATH AND CORONARY ANGIOGRAPHY N/A 05/21/2021   Procedure: RIGHT/LEFT HEART CATH AND CORONARY ANGIOGRAPHY;  Surgeon: Dann Candyce RAMAN, MD;  Location: [facility] INVASIVE CV LAB;  Service: Cardiovascular;  Laterality: N/A;   TEE WITHOUT CARDIOVERSION N/A 07/29/2021   Procedure: TRANSESOPHAGEAL ECHOCARDIOGRAM (TEE);  Surgeon: Lucas Dorise POUR, MD;  Location: [facility] OR;  Service: Open Heart Surgery;  Laterality: N/A;     Home Medications:  Prior to Admission medications   Medication Sig Start Date End Date Taking? Authorizing Provider  aspirin  EC 81 MG tablet Take 1 tablet (81 mg total) by mouth daily. Swallow whole. 01/04/23   Ghimire, Donalda HERO, MD  atorvastatin  (LIPITOR ) 80 MG tablet Take 1 tablet (80 mg total) by mouth daily. 05/24/21    Jillian Buttery, MD  dapagliflozin  propanediol (FARXIGA ) 10 MG TABS tablet Take 1 tablet (10 mg total) by mouth daily before breakfast. 09/05/22   Milford, Harlene HERO, FNP  ELIQUIS  5 MG TABS tablet TAKE 1 TABLET BY MOUTH TWICE A DAY Patient taking differently: Take 5 mg by mouth daily at 6 (six) AM. 09/04/23   Bensimhon, Toribio SAUNDERS, MD  famotidine  (PEPCID ) 20 MG tablet TAKE 1 TABLET BY MOUTH EVERYDAY AT BEDTIME 08/03/23   Soldatova, Liuba, MD  famotidine  (PEPCID ) 40 MG tablet Take 40 mg by mouth daily. 04/27/23   [provider]  furosemide  (LASIX ) 20 MG tablet TAKE 2 TABLETS (40 MG TOTAL) BY MOUTH DAILY. 07/27/23   Bensimhon, Toribio SAUNDERS, MD  furosemide  (LASIX ) 20 MG tablet Take 20 mg by mouth 3 (three) times a week. 07/02/21   [provider]  furosemide  (LASIX ) 20 MG tablet TAKE 1 TABLET BY MOUTH ON MONDAY WEDNESDAY  AND FRIDAY 01/19/24   Colletta Manuelita Garre, PA-C  glipiZIDE (GLUCOTROL) 10 MG tablet Take 10 mg by mouth daily at 6 (six) AM. 11/09/23   [provider]  glipiZIDE (GLUCOTROL) 5 MG tablet Take 5 mg by mouth daily. 09/01/22   [provider]  indomethacin  (INDOCIN ) 25 MG capsule Take 25 mg by mouth daily as needed for mild pain or moderate pain. Patient not taking: Reported on 12/17/2023    [provider]  levothyroxine  (SYNTHROID ) 137 MCG tablet Take 137 mcg by mouth daily before breakfast.    [provider]  metoprolol  succinate (TOPROL -XL) 25 MG 24 hr tablet Take 0.5 tablets (12.5 mg total) by mouth daily. 12/17/23   Wyn Jackee VEAR Mickey., NP  mupirocin  ointment (BACTROBAN ) 2 % Apply 1 Application topically 2 (two) times daily as needed. 05/14/23   [provider]  nitroGLYCERIN  (NITROSTAT ) 0.4 MG SL tablet Place 1 tablet (0.4 mg total) under the tongue every 5 (five) minutes as needed for chest pain. Patient not taking: Reported on 12/17/2023 06/05/23   Bensimhon, Toribio SAUNDERS, MD  omeprazole  (PRILOSEC) 40 MG capsule Take 40 mg twice daily for 4  weeks and then go down to once daily Patient taking differently: Take 40 mg by mouth daily. 09/30/23   Craig Alan SAUNDERS, PA-C    Scheduled Meds:  amLODipine   5 mg Oral Daily   furosemide   20 mg Intravenous Once   labetalol   100 mg Oral BID   Continuous Infusions:  sodium chloride      PRN Meds:   Allergies:   No Known Allergies  Social History:   Social History   Socioeconomic History   Marital status: Divorced    Spouse name: Not on file   Number of children: 1   Years of education: Not on file   Highest education level: 10th grade  Occupational History   Occupation: retired  Tobacco Use   Smoking status: Former    Current packs/day: 0.00    Average packs/day: 1.5 packs/day for 15.0 years (22.5 ttl pk-yrs)    Types: Cigarettes    Start date: 1968    Quit date: 1983    Years since quitting: 42.7   Smokeless tobacco: Never  Vaping Use   Vaping status: Never Used  Substance and Sexual Activity   Alcohol use: No   Drug use: No   Sexual activity: Not on file  Other Topics Concern   Not on file  Social History Narrative   Not on file   Social Drivers of Health   Financial Resource Strain: Low Risk  (12/03/2023)   Received from [facility]   Overall Financial Resource Strain (CARDIA)    Difficulty of Paying Living Expenses: Not hard at all  Food Insecurity: No Food Insecurity (12/03/2023)   Received from [facility]   Hunger Vital Sign    Within the past 12 months, you worried that your food would run out before you got the money to buy more.: Never true    Within the past 12 months, the food you bought just didn't last and you didn't have money to get more.: Never true  Transportation Needs: No Transportation Needs (12/03/2023)   Received from [facility] - Transportation    In the past 12 months, has lack of transportation kept you from medical appointments or from getting medications?: No    Lack of  Transportation (Non-Medical): No  Physical Activity: Not on  file  Stress: Not on file  Social Connections: Not on file  Intimate Partner Violence: Not At Risk (12/28/2022)   Humiliation, Afraid, Rape, and Kick questionnaire    Fear of Current or Ex-Partner: No    Emotionally Abused: No    Physically Abused: No    Sexually Abused: No    Family History:   Family History  Problem Relation Age of Onset   Diabetes Mother    Diabetes Father    Throat cancer Brother    Heart attack Maternal Uncle    Sudden Cardiac Death Neg Hx    Liver disease Neg Hx    Colon cancer Neg Hx    Colon polyps Neg Hx    Esophageal cancer Neg Hx    Stomach cancer Neg Hx    Rectal cancer Neg Hx      ROS:  Please see the history of present illness.  All other ROS reviewed and negative.     Physical Exam/Data: Vitals:   01/19/24 1730 01/19/24 1745 01/19/24 1800 01/19/24 1825  BP: (!) 182/97 (!) 162/106 (!) 170/78 (!) 161/73  Pulse: 62 69 (!) 59   Resp: 13 18 13    Temp:      TempSrc:      SpO2: 97% 100% 97%   Weight:      Height:       No intake or output data in the 24 hours ending 01/19/24 1845    01/19/2024    9:35 AM 12/17/2023    2:47 PM 09/24/2023    1:36 PM  Last 3 Weights  Weight (lbs) 170 lb 165 lb 3.2 oz 178 lb  Weight (kg) 77.111 kg 74.934 kg 80.74 kg     Body mass index is 30.11 kg/m.  General:  Well nourished, well developed, in mild respiratory acute distress HEENT: normal Neck: JVD 12 cm Vascular: No carotid bruits; Distal pulses 2+ bilaterally Cardiac:  normal S1, S2; RRR; 2/6 murmur  Lungs: Rales bases bilaterally, no wheezing, rhonchi   Abd: soft, nontender, no hepatomegaly  Ext: no edema Musculoskeletal:  No deformities, BUE and BLE strength normal and equal Skin: warm and dry  Neuro:  CNs 2-12 intact, no focal abnormalities noted Psych:  Normal affect   EKG:  The EKG was personally reviewed and demonstrates: Sinus rhythm, heart rate 72, normal intervals, some diffuse  ST/T flattening Telemetry:  Telemetry was personally reviewed and demonstrates: Sinus rhythm  Relevant CV Studies: No cath since CABG Vascular ultrasound with ABIs: 01/04/2024 Summary:  Right: Resting right ankle-brachial index indicates noncompressible right lower extremity arteries. The right toe-brachial index is abnormal.  Right toe pressure is >60 mmHg which suggests adequate perfusion for healing.  Right brachial, radial, and ulnar arteries are multiphasic. The radial and ulnar arteries are non compressible.   Left: Resting left ankle-brachial index indicates noncompressible left  lower extremity arteries. The left toe-brachial index is abnormal.  Left toe pressure is >60 mmHg which suggests adequate perfusion for healing.  Left brachial, radial, and ulnar arteries are multiphasic. The radial and ulnar arteries are non compressible.   ECHO: 12/29/2022  1. Left ventricular ejection fraction, by estimation, is 60 to 65%. The  left ventricle has normal function. The left ventricle has no regional  wall motion abnormalities. There is mild concentric left ventricular  hypertrophy. Left ventricular diastolic parameters were normal.   2. Right ventricular systolic function is normal. The right ventricular  size is normal. Tricuspid regurgitation signal is inadequate for assessing PA  pressure.   3. The mitral valve is degenerative. Mild mitral valve regurgitation.   4. The aortic valve has been repaired/replaced. Aortic valve  regurgitation is not visualized. There is a 23 mm Edwards Inspiris Resilia pericardial valve present in the aortic position. Aortic valve mean gradient measures 12.0 mmHg.   5. The inferior vena cava is normal in size with <50% respiratory  variability, suggesting right atrial pressure of 8 mmHg.   Comparison(s): Prior images reviewed side by side. LVEF normal range at 60-65%. Pericardial AVR with grossly normal function (dimentionless index 0.61), no aortic  regurgitation. mean AV gradient has increased from 6-12 mmHg however since prior study.   CABG: 07/29/2021 Left internal mammary artery graft to the LAD SVG to diagonal SVG to OM SVG to distal RCA Aortic valve replacement using a 23 mm Edwards INSPIRIS RESILIA pericardial valve   Laboratory Data: High Sensitivity Troponin:   Recent Labs  Lab 01/19/24 0938 01/19/24 1155  TROPONINIHS 18* 17     Chemistry Recent Labs  Lab 01/19/24 0938  NA 138  K 4.5  CL 110  CO2 17*  GLUCOSE 128*  BUN 27*  CREATININE 2.09*  CALCIUM  8.9  GFRNONAA 34*  ANIONGAP 11    No results for input(s): PROT, ALBUMIN , AST, ALT, ALKPHOS, BILITOT in the last 168 hours. Lipids No results for input(s): CHOL, TRIG, HDL, LABVLDL, LDLCALC, CHOLHDL in the last 168 hours.  Hematology Recent Labs  Lab 01/19/24 0938  WBC 10.5  RBC 3.30*  HGB 10.3*  HCT 31.5*  MCV 95.5  MCH 31.2  MCHC 32.7  RDW 13.6  PLT 238   Thyroid   Lab Results  Component Value Date   TSH 2.100 12/17/2023    BNP Recent Labs  Lab 01/19/24 1155  BNP 363.2*    DDimer No results for input(s): DDIMER in the last 168 hours.  Radiology/Studies:  DG Chest 2 View Result Date: 01/19/2024 CLINICAL DATA:  Intermittent chest pain and shortness of breath for 2 years. EXAM: CHEST - 2 VIEW COMPARISON:  December 28, 2022 FINDINGS: Multiple sternal wires and vascular clips are present. These are seen on the prior study. The heart size and mediastinal contours are within normal limits. Very mild atelectasis is suspected within the retrocardiac region of the left lung base. Very small bilateral pleural effusions are seen. No pneumothorax is identified. The visualized skeletal structures are unremarkable. IMPRESSION: 1. Evidence of prior median sternotomy/CABG. 2. Very mild left basilar atelectasis. 3. Very small bilateral pleural effusions. Electronically Signed   By: Suzen Dials M.D.   On: 01/19/2024 10:31      Assessment and Plan: Chest pain: - Very minimal elevation of troponin at 18 is not consistent with ACS - Chest wall is not tender to palpation and he does not get any pain with deep inspiration -No rub on exam - MD advised on checking CK-MB - Continue to monitor for symptoms  2.  Acute on chronic CHF - In 2023, prior to bypass surgery and AVR, his EF was 25-30% - With excellent medical management, and surgery, his EF has been at least 50% since June 2023 - Last echo was in 2024, recheck - He recently had Lasix  20 mg IV, put out 575 cc w/ this, need more off, will give additional Lasix  20 mg for a total of 40 mg, and see how tolerated - he reports a 10 lb wt gain, but by chart records, is only 5 lbs up since his last office visit -  adjust Lasix  dose as needed.  3. CKD IIIb - GFR has been < 35 since 2024 - Cr 2.09 is lower than usual for him. - follow daily  4. HTN: - SBP < 120 since 01/07/2023 except for 05/29 when it was 157/90 - SBP < 100 by readings 07/02/2023 and 08/27/2023 but according to family, SBP has been consistently 80s for months. - per pt, he was asymptomatic with this until a couple of months ago. - today, big surprise that BP was 227/93 - BP has come down after Lasix   - add amlodipine  5 mg, has anti-anginal and BP effects - also add labetalol  100 mg bid, hold metoprolol  XL 12.5 mg nightly  5. CAD - no cath since CABG - no hx of ischemic sx recently - CP is very brief, started yesterday, has had multiple episodes - Despite this, troponin with a very minimal elevation, not consistent with ACS - Continue ASA, beta-blocker, statin - Follow-up on echo, if abnormal may need further ischemic eval  Otherwise, per IM   Risk Assessment/Risk Scores:    TIMI Risk Score for Unstable Angina or Non-ST Elevation MI:   The patient's TIMI risk score is 5, which indicates a 26% risk of all cause mortality, new or recurrent myocardial infarction or need for urgent  revascularization in the next 14 days.  New York  Heart Association (NYHA) Functional Class NYHA Class III  CHA2DS2-VASc Score = 5   This indicates a 7.2% annual risk of stroke. The patient's score is based upon: CHF History: 1 HTN History: 1 Diabetes History: 1 Stroke History: 0 Vascular Disease History: 1 Age Score: 1 Gender Score: 0    For questions or updates, please contact [facility] Please consult [email or web address] for contact info under      Signed, Shona Shad, PA-C  01/19/2024 6:45 PM

## 2024-01-19 NOTE — ED Triage Notes (Signed)
 Pt. Stated, I have chest pain with SOB off and on for the last 2 years. This episode started yesterday.

## 2024-01-19 NOTE — ED Notes (Signed)
 Pt taken back to triage for repeat 2nd trop.

## 2024-01-20 ENCOUNTER — Encounter (HOSPITAL_COMMUNITY): Payer: Self-pay | Admitting: Internal Medicine

## 2024-01-20 ENCOUNTER — Inpatient Hospital Stay (HOSPITAL_BASED_OUTPATIENT_CLINIC_OR_DEPARTMENT_OTHER)

## 2024-01-20 ENCOUNTER — Other Ambulatory Visit (HOSPITAL_COMMUNITY)

## 2024-01-20 DIAGNOSIS — I10 Essential (primary) hypertension: Secondary | ICD-10-CM | POA: Diagnosis present

## 2024-01-20 DIAGNOSIS — R7989 Other specified abnormal findings of blood chemistry: Secondary | ICD-10-CM | POA: Diagnosis not present

## 2024-01-20 DIAGNOSIS — I5033 Acute on chronic diastolic (congestive) heart failure: Secondary | ICD-10-CM | POA: Diagnosis present

## 2024-01-20 DIAGNOSIS — N184 Chronic kidney disease, stage 4 (severe): Secondary | ICD-10-CM | POA: Diagnosis present

## 2024-01-20 DIAGNOSIS — I5031 Acute diastolic (congestive) heart failure: Secondary | ICD-10-CM

## 2024-01-20 DIAGNOSIS — R0602 Shortness of breath: Secondary | ICD-10-CM | POA: Diagnosis not present

## 2024-01-20 DIAGNOSIS — R079 Chest pain, unspecified: Secondary | ICD-10-CM | POA: Diagnosis not present

## 2024-01-20 DIAGNOSIS — K219 Gastro-esophageal reflux disease without esophagitis: Secondary | ICD-10-CM | POA: Diagnosis present

## 2024-01-20 LAB — CBC WITH DIFFERENTIAL/PLATELET
Abs Immature Granulocytes: 0.02 K/uL (ref 0.00–0.07)
Basophils Absolute: 0.1 K/uL (ref 0.0–0.1)
Basophils Relative: 1 %
Eosinophils Absolute: 0.4 K/uL (ref 0.0–0.5)
Eosinophils Relative: 5 %
HCT: 27.9 % — ABNORMAL LOW (ref 39.0–52.0)
Hemoglobin: 9.2 g/dL — ABNORMAL LOW (ref 13.0–17.0)
Immature Granulocytes: 0 %
Lymphocytes Relative: 19 %
Lymphs Abs: 1.5 K/uL (ref 0.7–4.0)
MCH: 31.2 pg (ref 26.0–34.0)
MCHC: 33 g/dL (ref 30.0–36.0)
MCV: 94.6 fL (ref 80.0–100.0)
Monocytes Absolute: 0.8 K/uL (ref 0.1–1.0)
Monocytes Relative: 10 %
Neutro Abs: 5.3 K/uL (ref 1.7–7.7)
Neutrophils Relative %: 65 %
Platelets: 223 K/uL (ref 150–400)
RBC: 2.95 MIL/uL — ABNORMAL LOW (ref 4.22–5.81)
RDW: 13.4 % (ref 11.5–15.5)
WBC: 8.2 K/uL (ref 4.0–10.5)
nRBC: 0 % (ref 0.0–0.2)

## 2024-01-20 LAB — HEMOGLOBIN A1C
Hgb A1c MFr Bld: 6.9 % — ABNORMAL HIGH (ref 4.8–5.6)
Mean Plasma Glucose: 151.33 mg/dL

## 2024-01-20 LAB — CBG MONITORING, ED
Glucose-Capillary: 125 mg/dL — ABNORMAL HIGH (ref 70–99)
Glucose-Capillary: 116 mg/dL — ABNORMAL HIGH (ref 70–99)

## 2024-01-20 LAB — COMPREHENSIVE METABOLIC PANEL WITH GFR
ALT: 18 U/L (ref 0–44)
AST: 25 U/L (ref 15–41)
Albumin: 3.2 g/dL — ABNORMAL LOW (ref 3.5–5.0)
Alkaline Phosphatase: 66 U/L (ref 38–126)
Anion gap: 11 (ref 5–15)
BUN: 30 mg/dL — ABNORMAL HIGH (ref 8–23)
CO2: 22 mmol/L (ref 22–32)
Calcium: 8.4 mg/dL — ABNORMAL LOW (ref 8.9–10.3)
Chloride: 105 mmol/L (ref 98–111)
Creatinine, Ser: 2.28 mg/dL — ABNORMAL HIGH (ref 0.61–1.24)
GFR, Estimated: 30 mL/min — ABNORMAL LOW (ref >60)
Glucose, Bld: 120 mg/dL — ABNORMAL HIGH (ref 70–99)
Potassium: 4 mmol/L (ref 3.5–5.1)
Sodium: 138 mmol/L (ref 135–145)
Total Bilirubin: 0.6 mg/dL (ref 0.0–1.2)
Total Protein: 6.6 g/dL (ref 6.5–8.1)

## 2024-01-20 LAB — GLUCOSE, CAPILLARY
Glucose-Capillary: 106 mg/dL — ABNORMAL HIGH (ref 70–99)
Glucose-Capillary: 157 mg/dL — ABNORMAL HIGH (ref 70–99)

## 2024-01-20 LAB — MAGNESIUM
Magnesium: 1.5 mg/dL — ABNORMAL LOW (ref 1.7–2.4)
Magnesium: 1.6 mg/dL — ABNORMAL LOW (ref 1.7–2.4)

## 2024-01-20 LAB — TROPONIN I (HIGH SENSITIVITY): Troponin I (High Sensitivity): 20 ng/L — ABNORMAL HIGH (ref <18)

## 2024-01-20 MED ORDER — FAMOTIDINE 20 MG PO TABS: 20.0000 mg | ORAL_TABLET | Freq: Every day | ORAL | Status: DC

## 2024-01-20 MED ORDER — FUROSEMIDE 10 MG/ML IJ SOLN
40.0000 mg | Freq: Every day | INTRAMUSCULAR | Status: DC
  Administered 2024-01-20: 40 mg via INTRAVENOUS
  Filled 2024-01-20: qty 4

## 2024-01-20 MED ORDER — ASPIRIN 81 MG PO TBEC
81.0000 mg | DELAYED_RELEASE_TABLET | Freq: Every day | ORAL | Status: DC
  Administered 2024-01-20 – 2024-01-21 (×2): 81 mg via ORAL
  Filled 2024-01-20 (×2): qty 1

## 2024-01-20 MED ORDER — INSULIN ASPART 100 UNIT/ML IJ SOLN
0.0000 [IU] | Freq: Three times a day (TID) | INTRAMUSCULAR | Status: DC
  Administered 2024-01-20 – 2024-01-21 (×2): 1 [IU] via SUBCUTANEOUS

## 2024-01-20 MED ORDER — PANTOPRAZOLE SODIUM 40 MG PO TBEC
40.0000 mg | DELAYED_RELEASE_TABLET | Freq: Every day | ORAL | Status: DC
  Administered 2024-01-20 – 2024-01-21 (×2): 40 mg via ORAL
  Filled 2024-01-20 (×2): qty 1

## 2024-01-20 MED ORDER — INSULIN ASPART 100 UNIT/ML IJ SOLN: 0.0000 [IU] | Freq: Every day | INTRAMUSCULAR | Status: DC

## 2024-01-20 MED ORDER — ATORVASTATIN CALCIUM 80 MG PO TABS
80.0000 mg | ORAL_TABLET | Freq: Every day | ORAL | Status: DC
  Administered 2024-01-20 – 2024-01-21 (×2): 80 mg via ORAL
  Filled 2024-01-20: qty 2
  Filled 2024-01-20: qty 1

## 2024-01-20 MED ORDER — INSULIN ASPART 100 UNIT/ML IJ SOLN
3.0000 [IU] | Freq: Three times a day (TID) | INTRAMUSCULAR | Status: DC
Start: 2024-01-20 — End: 2024-01-21
  Administered 2024-01-20 – 2024-01-21 (×2): 3 [IU] via SUBCUTANEOUS

## 2024-01-20 MED ORDER — LEVOTHYROXINE SODIUM 112 MCG PO TABS
137.0000 ug | ORAL_TABLET | Freq: Every day | ORAL | Status: DC
  Administered 2024-01-20 – 2024-01-21 (×2): 137 ug via ORAL
  Filled 2024-01-20 (×2): qty 1

## 2024-01-20 NOTE — TOC CM/SW Note (Signed)
 Transition of Care [facility]) - Inpatient Brief Assessment   Patient Details  Name: Antonio Cervantes MRN: [id number] Date of Birth: 26-Feb-1956  [facility] CM/SW Contact:    Waddell Barnie Rama, RN Phone Number: 01/20/2024, 5:24 PM   Clinical Narrative: From home with daughter, has PCP and insurance on file, states has no HH services in place at this time, has cane/walker at home.  States family member will transport them home at [employer] and family is support system, states gets medications from [facility] in [place].  Pta self ambulatory with walker/cane.   There are no ICM needs identified  at this time.  Please place consult for ICM needs.     Transition of Care Asessment: Insurance and Status: Insurance coverage has been reviewed Patient has primary care physician: Yes Home environment has been reviewed: from home , daughter lives with him Prior level of function:: ambulatory with walker/cane Prior/Current Home Services: Current home services (walker/cane) Social Drivers of Health Review: SDOH reviewed no interventions necessary Readmission risk has been reviewed: Yes Transition of care needs: no transition of care needs at this time

## 2024-01-20 NOTE — Progress Notes (Signed)
   Patient Name: Antonio Cervantes Date of Encounter: 01/20/2024 [facility] Cardiologist: Heart failure clinic  Interval Summary  .    Feels back to baseline this morning Denies difficulty breathing, or chest pain  Vital Signs .    Vitals:   01/20/24 0653 01/20/24 0700 01/20/24 0800 01/20/24 0906  BP:  (!) 125/54 (!) 124/54 (!) 150/66  Pulse:  66 64 71  Resp:  13 12 15   Temp: 98 F (36.7 C)     TempSrc: Oral     SpO2:  96% 95% 98%  Weight:      Height:        Intake/Output Summary (Last 24 hours) at 01/20/2024 0928 Last data filed at 01/20/2024 0019 Gross per 24 hour  Intake --  Output 1250 ml  Net -1250 ml      01/19/2024    9:35 AM 12/17/2023    2:47 PM 09/24/2023    1:36 PM  Last 3 Weights  Weight (lbs) 170 lb 165 lb 3.2 oz 178 lb  Weight (kg) 77.111 kg 74.934 kg 80.74 kg      Telemetry/ECG    01/20/2024- Personally Reviewed No significant arrhythmia  Physical Exam .   Physical Exam Vitals and nursing note reviewed.  Constitutional:      General: He is not in acute distress. Neck:     Vascular: No JVD.  Cardiovascular:     Rate and Rhythm: Normal rate and regular rhythm.     Heart sounds: Murmur heard.     Harsh midsystolic murmur is present with a grade of 1/6 at the upper right sternal border radiating to the neck.  Pulmonary:     Effort: Pulmonary effort is normal.     Breath sounds: Normal breath sounds. No wheezing or rales.  Musculoskeletal:     Right lower leg: No edema.     Left lower leg: No edema.      Assessment & Plan .     68 y/o male w/hypertension, type 2 DM, CAD s/p s/p CABG x 4, LIMA to LAD, SVG to diagonal, SVG to OM, SVG to distal RCA and AVR), ischemic cardiomyopathy w/recovered LVEF, paroxysmal Afib, nicotine dependence   Shortness of breath: Euvolemic this morning after overnight diuresis. Back to his baseline. Blood pressure is much improved as well. Recommend only labetalol  100 mg twice daily as needed for SBP  >140 mmHg, on discharge. Echocardiogram pending. Patient ate this morning and may not be able to get renal artery duplex.  Will consider doing this outpatient.   CAD: Mild trop likely due to heart failure and hypertension. Do not suspect ACS.   From cardiac standpoint, he is reasonable for discharge today. Will arrange outpatient follow-up.  For questions or updates, please contact [facility] Please consult [email or web address] for contact info under        Signed, Newman JINNY Lawrence, MD

## 2024-01-20 NOTE — Care Management CC44 (Signed)
 Condition Code 44 Documentation Completed  Patient Details  Name: BRECKIN SAVANNAH MRN: [id number] Date of Birth: [birth year]   Condition Code 44 given:  Yes Patient signature on Condition Code 44 notice:  Yes Documentation of 2 MD's agreement:  Yes Code 44 added to claim:  Yes    Waddell Barnie Rama, RN 01/20/2024, 5:19 PM

## 2024-01-20 NOTE — Care Management Obs Status (Signed)
 MEDICARE OBSERVATION STATUS NOTIFICATION   Patient Details  Name: Antonio Cervantes MRN: [id number] Date of Birth: 1955-07-08   Medicare Observation Status Notification Given:  Yes    Waddell Barnie Rama, RN 01/20/2024, 5:19 PM

## 2024-01-20 NOTE — Progress Notes (Signed)
 Heart Failure Navigator Progress Note  Assessed for Heart & Vascular TOC clinic readiness.  Patient does not meet criteria due to Advanced Heart Failure Team patient of Dr. Gala Romney. .   Navigator will sign off at this time.   Rhae Hammock, BSN, Scientist, clinical (histocompatibility and immunogenetics) Only

## 2024-01-20 NOTE — Progress Notes (Signed)
 [facility] PROGRESS NOTE    Progress Note  [name]  FMW:[id number] DOB: 1955/06/04 DOA: 01/19/2024 PCP: [name], FNP     Brief Narrative:   Antonio Cervantes is an 68 y.o. 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 with an aortic valve replacement in April 2023, ischemic cardiomyopathy with a recovered ejection fraction last 2D echo showed an EF of 60% chronic kidney disease stage IV with a baseline like around 2-2.8, history of GI bleed comes in for shortness of breath chest pain and dizziness, his chest pain in the ED resolved with nitroglycerin    Assessment/Plan:   Acute on chronic diastolic CHF (congestive heart failure) (HCC) Positive hepatojugular reflux on physical exam.  Started on IV Lasix . Monitor strict I's and O's and daily weights.  Monitor electrolytes and replete as necessary. Cardiology has been consulted.  Cardiology recommended to continue Lasix  IV Norvasc  and labetalol . Negative about 1200 cc. Discontinue metoprolol  2D echo has been sent.  Diabetes mellitus type 2: Last A1c of 7.6. Hold all oral hypoglycemic agents. Start on sliding scale insulin   Essential hypertension: Cardiology recommended to discontinue metoprolol  and start labetalol . Blood pressure is low this morning hold Norvasc  for now.  Hyperlipidemia: Continue statins.  Paroxysmal atrial fibrillation (HCC) [place] Vascor greater than 2. Currently rate controlled continue Eliquis .  GERD: Continue PPI.  Chronic kidney disease stage IV: Creatinine appears to be at baseline. Monitor strict I's and O's and daily weights.  Anemia of chronic renal disease: Hemoglobin has been relatively stable continue to monitor intermittently.  DVT prophylaxis: Eliquis  Family Communication:none Status is: Observation The patient will  require care spanning > 2 midnights and should be moved to inpatient because: Admit to inpatient    Code Status:     Code Status Orders  (From admission, onward)           Start     Ordered   01/19/24 1933  Full code  Continuous       Question:  By:  Answer:  Consent: discussion documented in EHR   01/19/24 1932           Code Status History     Date Active Date Inactive Code Status Order ID Comments User Context   12/28/2022 1509 01/01/2023 1646 Full Code [id number]  Antonio Nydia POUR, MD ED   07/11/2022 2058 07/14/2022 2229 Full Code [id number]  Antonio Camila LABOR, MD ED   07/29/2021 1413 08/05/2021 1829 Full Code [id number]  Antonio Laurel MATSU, PA-C Inpatient   07/23/2021 0138 07/29/2021 1413 Full Code [id number]  Antonio Cervantes., MD ED   05/20/2021 0158 05/23/2021 1854 Full Code [id number]  Antonio Locus, DO ED   05/19/2021 2118 05/20/2021 0158 Full Code [id number]  Antonio Locus, DO ED         IV Access:   Peripheral IV   Procedures and diagnostic studies:   DG Chest 2 View Result Date: 01/19/2024 CLINICAL DATA:  Intermittent chest pain and shortness of breath for 2 years. EXAM: CHEST - 2 VIEW COMPARISON:  December 28, 2022 FINDINGS: Multiple sternal wires and vascular clips are present. These are seen on the prior study. The heart size and mediastinal contours are within normal limits. Very mild atelectasis is suspected within the retrocardiac region of the left lung base. Very small bilateral pleural effusions are seen. No pneumothorax is identified. The visualized skeletal structures are unremarkable. IMPRESSION:  1. Evidence of prior median sternotomy/CABG. 2. Very mild left basilar atelectasis. 3. Very small bilateral pleural effusions. Electronically Signed   By: Suzen Dials M.D.   On: 01/19/2024 10:31     Medical Consultants:   None.   Subjective:    DEANGLEO PASSAGE relates his shortness of breath is improved.  Objective:    Vitals:   01/20/24 0045  01/20/24 0100 01/20/24 0205 01/20/24 0400  BP: (!) 119/47 (!) 107/52  121/69  Pulse: 64 67  72  Resp: 16 15  18   Temp:   97.9 F (36.6 C)   TempSrc:   Oral   SpO2: 95% 94%  95%  Weight:      Height:       SpO2: 95 %   Intake/Output Summary (Last 24 hours) at 01/20/2024 0626 Last data filed at 01/20/2024 0019 Gross per 24 hour  Intake --  Output 1250 ml  Net -1250 ml   Filed Weights   01/19/24 0935  Weight: 77.1 kg    Exam: General exam: In no acute distress. Respiratory system: Good air movement and clear to auscultation. Cardiovascular system: S1 & S2 heard, RRR. No JVD. Gastrointestinal system: Abdomen is nondistended, soft and nontender.  Extremities: No pedal edema. Skin: No rashes, lesions or ulcers Psychiatry: Judgement and insight appear normal. Mood & affect appropriate.    Data Reviewed:    Labs: Basic Metabolic Panel: Recent Labs  Lab 01/19/24 0938  NA 138  K 4.5  CL 110  CO2 17*  GLUCOSE 128*  BUN 27*  CREATININE 2.09*  CALCIUM  8.9   GFR Estimated Creatinine Clearance: 31.1 mL/min (A) (by C-G formula based on SCr of 2.09 mg/dL (H)). Liver Function Tests: No results for input(s): AST, ALT, ALKPHOS, BILITOT, PROT, ALBUMIN  in the last 168 hours. No results for input(s): LIPASE, AMYLASE in the last 168 hours. No results for input(s): AMMONIA in the last 168 hours. Coagulation profile No results for input(s): INR, PROTIME in the last 168 hours. COVID-19 Labs  No results for input(s): DDIMER, FERRITIN, LDH, CRP in the last 72 hours.  Lab Results  Component Value Date   SARSCOV2NAA NEGATIVE 07/22/2021   SARSCOV2NAA NEGATIVE 05/19/2021    CBC: Recent Labs  Lab 01/19/24 0938  WBC 10.5  HGB 10.3*  HCT 31.5*  MCV 95.5  PLT 238   Cardiac Enzymes: No results for input(s): CKTOTAL, CKMB, CKMBINDEX, TROPONINI in the last 168 hours. BNP (last 3 results) No results for input(s): PROBNP in the last 8760  hours. CBG: Recent Labs  Lab 01/19/24 2201  GLUCAP 134*   D-Dimer: No results for input(s): DDIMER in the last 72 hours. Hgb A1c: No results for input(s): HGBA1C in the last 72 hours. Lipid Profile: No results for input(s): CHOL, HDL, LDLCALC, TRIG, CHOLHDL, LDLDIRECT in the last 72 hours. Thyroid  function studies: No results for input(s): TSH, T4TOTAL, T3FREE, THYROIDAB in the last 72 hours.  Invalid input(s): FREET3 Anemia work up: No results for input(s): VITAMINB12, FOLATE, FERRITIN, TIBC, IRON, RETICCTPCT in the last 72 hours. Sepsis Labs: Recent Labs  Lab 01/19/24 0938  WBC 10.5   Microbiology No results found for this or any previous visit (from the past 240 hours).   Medications:    amLODipine   5 mg Oral Daily   apixaban   5 mg Oral BID   aspirin  EC  81 mg Oral Daily   atorvastatin   80 mg Oral Daily   famotidine   20 mg Oral QHS  insulin  aspart  0-5 Units Subcutaneous QHS   insulin  aspart  0-9 Units Subcutaneous TID WC   labetalol   100 mg Oral BID   levothyroxine   137 mcg Oral Q0600   pantoprazole   40 mg Oral Daily   Continuous Infusions:  sodium chloride         LOS: 0 days   Erle Odell Castor  [facility]  01/20/2024, 6:26 AM

## 2024-01-21 ENCOUNTER — Other Ambulatory Visit (INDEPENDENT_AMBULATORY_CARE_PROVIDER_SITE_OTHER): Payer: Self-pay | Admitting: Otolaryngology

## 2024-01-21 ENCOUNTER — Other Ambulatory Visit (HOSPITAL_COMMUNITY): Payer: Self-pay

## 2024-01-21 ENCOUNTER — Telehealth (HOSPITAL_COMMUNITY): Payer: Self-pay | Admitting: Internal Medicine

## 2024-01-21 ENCOUNTER — Observation Stay (HOSPITAL_COMMUNITY)

## 2024-01-21 DIAGNOSIS — I5033 Acute on chronic diastolic (congestive) heart failure: Secondary | ICD-10-CM | POA: Diagnosis not present

## 2024-01-21 LAB — BASIC METABOLIC PANEL WITH GFR
Anion gap: 12 (ref 5–15)
BUN: 33 mg/dL — ABNORMAL HIGH (ref 8–23)
CO2: 22 mmol/L (ref 22–32)
Calcium: 8.3 mg/dL — ABNORMAL LOW (ref 8.9–10.3)
Chloride: 105 mmol/L (ref 98–111)
Creatinine, Ser: 2.72 mg/dL — ABNORMAL HIGH (ref 0.61–1.24)
GFR, Estimated: 25 mL/min — ABNORMAL LOW (ref >60)
Glucose, Bld: 129 mg/dL — ABNORMAL HIGH (ref 70–99)
Potassium: 4.2 mmol/L (ref 3.5–5.1)
Sodium: 139 mmol/L (ref 135–145)

## 2024-01-21 LAB — GLUCOSE, CAPILLARY: Glucose-Capillary: 129 mg/dL — ABNORMAL HIGH (ref 70–99)

## 2024-01-21 MED ORDER — FUROSEMIDE 20 MG PO TABS
20.0000 mg | ORAL_TABLET | Freq: Every day | ORAL | 0 refills | Status: AC
  Filled 2024-01-21: qty 30, 30d supply, fill #0

## 2024-01-21 MED ORDER — FUROSEMIDE 20 MG PO TABS
20.0000 mg | ORAL_TABLET | Freq: Every day | ORAL | Status: DC
  Administered 2024-01-21: 20 mg via ORAL
  Filled 2024-01-21: qty 1

## 2024-01-21 MED ORDER — LABETALOL HCL 100 MG PO TABS
100.0000 mg | ORAL_TABLET | Freq: Two times a day (BID) | ORAL | 0 refills | Status: AC
  Filled 2024-01-21: qty 60, 30d supply, fill #0

## 2024-01-21 NOTE — Plan of Care (Signed)

## 2024-01-21 NOTE — Discharge Summary (Signed)
 Physician Discharge Summary  Antonio Cervantes FMW:[id number] DOB: 1955/05/04 DOA: 01/19/2024  PCP: Antonio Dwayne NOVAK, FNP  Admit date: 01/19/2024 Discharge date: 01/21/2024  Admitted From: Home Disposition:  Home  Recommendations for Outpatient Follow-up:  Follow-up with cardiology in 1 week for renal artery duplex to rule out renal artery stenosis.  Follow-up on 2D echo Please obtain BMP/CBC in one week   Home Health:No Equipment/Devices:None  Discharge Condition:Stable CODE STATUS:Full Diet recommendation: Heart Healthy  Brief/Interim Summary: 68 y.o. 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 with an aortic valve replacement in April 2023, ischemic cardiomyopathy with a recovered ejection fraction last 2D echo showed an EF of 60% chronic kidney disease stage IV with a baseline like around 2-2.8, history of GI bleed comes in for shortness of breath chest pain and dizziness, his chest pain in the ED resolved with nitroglycerin    Discharge Diagnoses:  Principal Problem:   Acute on chronic diastolic CHF (congestive heart failure) (HCC) Active Problems:   Paroxysmal atrial fibrillation (HCC)   DM2 (diabetes mellitus, type 2) (HCC)   HLD (hyperlipidemia)   Anemia of chronic disease   Chest pain   Essential hypertension   CKD (chronic kidney disease) stage 4, GFR 15-29 ml/min (HCC)   GERD (gastroesophageal reflux disease)   Shortness of breath   Acute diastolic heart failure (HCC)   Elevated troponin  Acute on chronic diastolic dysfunction: He was started on IV Lasix  diuresed well. Cardiology was consulted recommend to continue Lasix  and changed to oral labetalol . 2D echo will be follow-up as an outpatient. He will follow-up with cardiology as an outpatient.  Diabetes mellitus type 2: No changes made to his medication resume oral  hypoglycemic agent as an outpatient.  Essential hypertension: Continue labetalol  and Lasix , blood pressure well-controlled.  Hyperlipidemia: Continue statins.  Paroxysmal atrial fibrillation: Rate controlled with a chads Vascor greater than 2. Continue Eliquis  and labetalol .  GERD: Continue PPI.  Chronic kidney stage IV: Creatinine appears to be at baseline.  Anemia of chronic renal disease: Hemoglobin relatively stable follow-up with PCP as an outpatient.   Discharge Instructions  Discharge Instructions     Diet - low sodium heart healthy   Complete by: As directed    Increase activity slowly   Complete by: As directed       Allergies as of 01/21/2024   No Known Allergies      Medication List     STOP taking these medications    indomethacin  25 MG capsule Commonly known as: INDOCIN        TAKE these medications    aspirin  EC 81 MG tablet Take 1 tablet (81 mg total) by mouth daily. Swallow whole.   atorvastatin  80 MG tablet Commonly known as: LIPITOR  Take 1 tablet (80 mg total) by mouth daily.   dapagliflozin  propanediol 10 MG Tabs tablet Commonly known as: Farxiga  Take 1 tablet (10 mg total) by mouth daily before breakfast.   Eliquis  5 MG Tabs tablet Generic drug: apixaban  TAKE 1 TABLET BY MOUTH TWICE A DAY What changed: when to take this   famotidine  20 MG tablet Commonly known as: PEPCID  TAKE 1 TABLET BY MOUTH EVERYDAY AT BEDTIME   furosemide  20 MG tablet Commonly known as: LASIX  Take 1 tablet (20 mg total) by mouth daily. What changed: See the new instructions.   glipiZIDE 10 MG tablet Commonly known as: GLUCOTROL Take 10 mg by  mouth daily at 6 (six) AM.   levothyroxine  137 MCG tablet Commonly known as: SYNTHROID  Take 137 mcg by mouth at bedtime.   metoprolol  succinate 25 MG 24 hr tablet Commonly known as: TOPROL -XL Take 0.5 tablets (12.5 mg total) by mouth daily.   mupirocin  ointment 2 % Commonly known as: BACTROBAN  Apply 1  Application topically 2 (two) times daily as needed.   nitroGLYCERIN  0.4 MG SL tablet Commonly known as: NITROSTAT  Place 1 tablet (0.4 mg total) under the tongue every 5 (five) minutes as needed for chest pain.   omeprazole  40 MG capsule Commonly known as: PRILOSEC Take 40 mg twice daily for 4 weeks and then go down to once daily What changed:  how much to take how to take this when to take this additional instructions        Follow-up Information     Antonio Dwayne NOVAK, FNP Follow up.   Specialty: Nurse Practitioner Contact information: [address] [place] [address] [phone]                No Known Allergies  Consultations: Cardiology   Procedures/Studies: VAS US  RENAL ARTERY DUPLEX Result Date: 01/20/2024 ABDOMINAL VISCERAL Patient Name:  Antonio Cervantes  Date of Exam:   01/20/2024 Medical Rec #: [id number]          Accession #:    [id number] Date of Birth: Aug 27, 1955         Patient Gender: M Patient Age:   68 years Exam Location:  [facility] Procedure:      VAS US  RENAL ARTERY DUPLEX Referring Phys: [id number] EVA Cervantes PORE -------------------------------------------------------------------------------- Indications: Hypertension High Risk Factors: Hypertension, hyperlipidemia, Diabetes, past history of                    smoking, prior MI. Other Factors: Afib, CKD, CHF, s/p AVR and CABGx4. Limitations: Air/bowel gas. Comparison Study: No previous exams Performing Technologist: Jody Hill RVT, RDMS  Examination Guidelines: A complete evaluation includes B-mode imaging, spectral Doppler, color Doppler, and power Doppler as needed of all accessible portions of each vessel. Bilateral testing is considered an integral part of a complete examination. Limited examinations for reoccurring indications may be performed as noted.  Duplex Findings: +----------------------+--------+--------+------+-------------------+ Mesenteric            PSV cm/sEDV  cm/sPlaque     Comments       +----------------------+--------+--------+------+-------------------+ Aorta at SMA             91      13                             +----------------------+--------+--------+------+-------------------+ Celiac Artery Proximal                        not visualized    +----------------------+--------+--------+------+-------------------+ SMA Proximal            179      17         not well visualized +----------------------+--------+--------+------+-------------------+    +------------------+--------+--------+--------------+ Right Renal ArteryPSV cm/sEDV cm/s   Comment     +------------------+--------+--------+--------------+ Origin                            not visualized +------------------+--------+--------+--------------+ Proximal            114      19                  +------------------+--------+--------+--------------+  Mid                  71      9                   +------------------+--------+--------+--------------+ Distal               87      17                  +------------------+--------+--------+--------------+ +-----------------+--------+--------+-------+ Left Renal ArteryPSV cm/sEDV cm/sComment +-----------------+--------+--------+-------+ Origin              98      16           +-----------------+--------+--------+-------+ Proximal            65      9            +-----------------+--------+--------+-------+ Mid                 43      9            +-----------------+--------+--------+-------+ Distal              78      10           +-----------------+--------+--------+-------+ +------------+--------+--------+----+-----------+--------+--------+----+ Right KidneyPSV cm/sEDV cm/sRI  Left KidneyPSV cm/sEDV cm/sRI   +------------+--------+--------+----+-----------+--------+--------+----+ Upper Pole  39      6       0.85Upper Pole 31      5       0.83  +------------+--------+--------+----+-----------+--------+--------+----+ Mid         24      7       0.        36      7       0.81 +------------+--------+--------+----+-----------+--------+--------+----+ Lower Pole  36      7       0.80Lower Pole 39      5       0.86 +------------+--------+--------+----+-----------+--------+--------+----+ Hilar       32      7       0.77Hilar      40      7       0.84 +------------+--------+--------+----+-----------+--------+--------+----+ +------------------+-----------------+------------------+-----------------+ Right Kidney                       Left Kidney                         +------------------+-----------------+------------------+-----------------+ RAR                                RAR                                 +------------------+-----------------+------------------+-----------------+ RAR (manual)      1.25             RAR (manual)      1.08              +------------------+-----------------+------------------+-----------------+ Cortex            23/5 cm/s RI 0.78Cortex            19/4 cm/s RI 0.80 +------------------+-----------------+------------------+-----------------+ Cortex thickness  Corex thickness                     +------------------+-----------------+------------------+-----------------+ Kidney length (cm)10.53            Kidney length (cm)10.03             +------------------+-----------------+------------------+-----------------+  Summary: Renal:  Right: No evidence of right renal artery stenosis. Abnormal right        Resistive Index. Normal size right kidney. RRV flow present. Left:  No evidence of left renal artery stenosis. Abnormal left        Resisitve Index. Normal size of left kidney. LRV flow        present.  *See table(s) above for measurements and observations.  Diagnosing physician: Debby Robertson  Electronically signed by Debby Robertson on 01/20/2024 at 7:40:28  PM.    Final    DG Chest 2 View Result Date: 01/19/2024 CLINICAL DATA:  Intermittent chest pain and shortness of breath for 2 years. EXAM: CHEST - 2 VIEW COMPARISON:  December 28, 2022 FINDINGS: Multiple sternal wires and vascular clips are present. These are seen on the prior study. The heart size and mediastinal contours are within normal limits. Very mild atelectasis is suspected within the retrocardiac region of the left lung base. Very small bilateral pleural effusions are seen. No pneumothorax is identified. The visualized skeletal structures are unremarkable. IMPRESSION: 1. Evidence of prior median sternotomy/CABG. 2. Very mild left basilar atelectasis. 3. Very small bilateral pleural effusions. Electronically Signed   By: Suzen Dials M.D.   On: 01/19/2024 10:31   VAS US  ABI WITH/WO TBI Result Date: 01/06/2024  LOWER EXTREMITY DOPPLER STUDY Patient Name:  MARRELL DICAPRIO  Date of Exam:   01/04/2024 Medical Rec #: [id number]          Accession #:    [id number] Date of Birth: [birth year]         Patient Gender: M Patient Age:   40 years Exam Location:  [address] Procedure:      VAS US  ABI WITH/WO TBI Referring Phys: ERNEST DICK --------------------------------------------------------------------------------  Indications: Peripheral artery disease. Per NP office note 12/17/23: Peripheral              neuropathy and numbness:              -Numbness in extremities likely due to diabetic neuropathy.              Diabetes well-controlled with A1c of 7.2%.              - Continue current diabetes management.              - Patient will complete lower and upper extremity Doppler to rule              out peripheral vascular disease High Risk         Hypertension, hyperlipidemia, Diabetes, past history of Factors:          smoking.  Performing Technologist: [place] RVT  Examination Guidelines: A complete evaluation includes at minimum, Doppler waveform signals and systolic blood pressure reading at  the level of bilateral brachial, anterior tibial, and posterior tibial arteries, when vessel segments are accessible. Bilateral testing is considered an integral part of a complete examination. Photoelectric Plethysmograph (PPG) waveforms and toe systolic pressure readings are included as required and additional duplex testing as needed. Limited examinations for reoccurring indications may be performed as noted.  ABI Findings: +---------+------------------+-----+-----------+--------+ Right  Rt Pressure (mmHg)IndexWaveform   Comment  +---------+------------------+-----+-----------+--------+ Brachial 197                                        +---------+------------------+-----+-----------+--------+ ATA      255               1.29 multiphasic         +---------+------------------+-----+-----------+--------+ PTA      246               1.25 multiphasic         +---------+------------------+-----+-----------+--------+ Great Toe64                0.32                     +---------+------------------+-----+-----------+--------+ +---------+------------------+-----+-----------+-------+ Left     Lt Pressure (mmHg)IndexWaveform   Comment +---------+------------------+-----+-----------+-------+ Brachial 187                                       +---------+------------------+-----+-----------+-------+ ATA      255               1.29 multiphasic        +---------+------------------+-----+-----------+-------+ PTA      255               1.29 multiphasic        +---------+------------------+-----+-----------+-------+ Great Toe117               0.59                    +---------+------------------+-----+-----------+-------+ +-------+-----------+-----------+------------+------------+ ABI/TBIToday's ABIToday's TBIPrevious ABIPrevious TBI +-------+-----------+-----------+------------+------------+ Right  [place]         0.32                                 +-------+-----------+-----------+------------+------------+ Left   [place]         0.59                                +-------+-----------+-----------+------------+------------+  No previous ABI.  Summary: Right: Resting right ankle-brachial index indicates noncompressible right lower extremity arteries. The right toe-brachial index is abnormal. Right toe pressure is >60 mmHg which suggests adequate perfusion for healing. Right brachial, radial, and ulnar arteries are multiphasic. The radial and ulnar arteries are non compressible. Left: Resting left ankle-brachial index indicates noncompressible left lower extremity arteries. The left toe-brachial index is abnormal. Left toe pressure is >60 mmHg which suggests adequate perfusion for healing. Left brachial, radial, and ulnar arteries are multiphasic. The radial and ulnar arteries are non compressible.  *See table(s) above for measurements and observations.  Suggest follow up study in when clinically indicated. If suspision for PAD, consider complete LE duplex as ABI can be falsely elevated in medial calcinosis of the vessels. Electronically signed by Gordy Bergamo MD on 01/06/2024 at 5:48:31 AM.    Final    (Echo, Carotid, EGD, Colonoscopy, ERCP)    Subjective: No complaints  Discharge Exam: Vitals:   01/21/24 0048 01/21/24 0413  BP: (!) 119/49 129/64  Pulse: 62 64  Resp: 18 18  Temp: 98.3 F (36.8 C) 98.1 F (36.7 C)  SpO2:  93% 96%   Vitals:   01/20/24 1544 01/20/24 2022 01/21/24 0048 01/21/24 0413  BP: 120/72 (!) 100/52 (!) 119/49 129/64  Pulse: 76 67 62 64  Resp: 16 18 18 18   Temp: 98.7 F (37.1 C) 98.3 F (36.8 C) 98.3 F (36.8 C) 98.1 F (36.7 C)  TempSrc: Oral Oral Oral Oral  SpO2: 99% 94% 93% 96%  Weight: 76.5 kg   76.5 kg  Height: 5' 6 (1.676 m)       General: Pt is alert, awake, not in acute distress Cardiovascular: RRR, S1/S2 +, no rubs, no gallops Respiratory: CTA bilaterally, no wheezing, no rhonchi Abdominal: Soft,  NT, ND, bowel sounds + Extremities: no edema, no cyanosis    The results of significant diagnostics from this hospitalization (including imaging, microbiology, ancillary and laboratory) are listed below for reference.     Microbiology: No results found for this or any previous visit (from the past 240 hours).   Labs: BNP (last 3 results) Recent Labs    01/19/24 1155  BNP 363.2*   Basic Metabolic Panel: Recent Labs  Lab 01/19/24 0938 01/20/24 0609 01/21/24 0156  NA 138 138 139  K 4.5 4.0 4.2  CL 110 105 105  CO2 17* 22 22  GLUCOSE 128* 120* 129*  BUN 27* 30* 33*  CREATININE 2.09* 2.28* 2.72*  CALCIUM  8.9 8.4* 8.3*  MG  --  1.6*  1.5*  --    Liver Function Tests: Recent Labs  Lab 01/20/24 0609  AST 25  ALT 18  ALKPHOS 66  BILITOT 0.6  PROT 6.6  ALBUMIN  3.2*   No results for input(s): LIPASE, AMYLASE in the last 168 hours. No results for input(s): AMMONIA in the last 168 hours. CBC: Recent Labs  Lab 01/19/24 0938 01/20/24 0609  WBC 10.5 8.2  NEUTROABS  --  5.3  HGB 10.3* 9.2*  HCT 31.5* 27.9*  MCV 95.5 94.6  PLT 238 223   Cardiac Enzymes: No results for input(s): CKTOTAL, CKMB, CKMBINDEX, TROPONINI in the last 168 hours. BNP: Invalid input(s): POCBNP CBG: Recent Labs  Lab 01/20/24 0742 01/20/24 1158 01/20/24 1554 01/20/24 2138 01/21/24 0627  GLUCAP 125* 116* 106* 157* 129*   D-Dimer No results for input(s): DDIMER in the last 72 hours. Hgb A1c Recent Labs    01/20/24 0609  HGBA1C 6.9*   Lipid Profile No results for input(s): CHOL, HDL, LDLCALC, TRIG, CHOLHDL, LDLDIRECT in the last 72 hours. Thyroid  function studies No results for input(s): TSH, T4TOTAL, T3FREE, THYROIDAB in the last 72 hours.  Invalid input(s): FREET3 Anemia work up No results for input(s): VITAMINB12, FOLATE, FERRITIN, TIBC, IRON, RETICCTPCT in the last 72 hours. Urinalysis    Component Value Date/Time    COLORURINE STRAW (A) 07/28/2021 2100   APPEARANCEUR CLEAR 07/28/2021 2100   LABSPEC 1.010 07/28/2021 2100   PHURINE 5.0 07/28/2021 2100   GLUCOSEU >=500 (A) 07/28/2021 2100   HGBUR NEGATIVE 07/28/2021 2100   BILIRUBINUR NEGATIVE 07/28/2021 2100   KETONESUR NEGATIVE 07/28/2021 2100   PROTEINUR NEGATIVE 07/28/2021 2100   NITRITE NEGATIVE 07/28/2021 2100   LEUKOCYTESUR NEGATIVE 07/28/2021 2100   Sepsis Labs Recent Labs  Lab 01/19/24 0938 01/20/24 0609  WBC 10.5 8.2   Microbiology No results found for this or any previous visit (from the past 240 hours).   Time coordinating discharge: Over 30 minutes  SIGNED:   Erle Odell Castor, MD  [facility] 01/21/2024, 7:19 AM Pager   If 7PM-7AM, please contact night-coverage [email or web address] Password [email or web address]

## 2024-01-21 NOTE — Progress Notes (Addendum)
 Discharge  Ok to discharge per Primary RN  Patient verbalized understanding of discharge POC.  NO Tele or PIV on during discharge.

## 2024-01-21 NOTE — Progress Notes (Signed)
 Discharge Delay  Patient needs echo and reading per MD before discharge.

## 2024-01-21 NOTE — Progress Notes (Signed)
 Per MD Ok to discharge and followup with cardiology.

## 2024-01-21 NOTE — TOC Transition Note (Signed)
 Transition of Care [facility]) - Discharge Note   Patient Details  Name: Antonio Cervantes MRN: [id number] Date of Birth: January 29, 1956  [facility] CM/SW Contact:  Waddell Barnie Rama, RN Phone Number: 01/21/2024, 8:39 AM   Clinical Narrative:    For dc today, has no needs.          Patient Goals and CMS Choice            Discharge Placement                       Discharge Plan and Services Additional resources added to the After Visit Summary for                                       Social Drivers of Health (SDOH) Interventions SDOH Screenings   Food Insecurity: No Food Insecurity (01/20/2024)  Housing: Low Risk  (01/20/2024)  Transportation Needs: No Transportation Needs (01/20/2024)  Utilities: Not At Risk (01/20/2024)  Alcohol Screen: Low Risk  (05/21/2021)  Financial Resource Strain: Low Risk  (12/03/2023)   Received from [facility]  Social Connections: Unknown (01/20/2024)  Tobacco Use: Medium Risk (01/20/2024)     Readmission Risk Interventions    01/20/2024    5:22 PM 08/05/2021   10:07 AM  Readmission Risk Prevention Plan  Medication Screening Complete   Transportation Screening Complete Complete  PCP or Specialist Appt within 5-7 Days  Complete  Home Care Screening  Complete  Medication Review (RN CM)  Complete

## 2024-01-27 DIAGNOSIS — Z09 Encounter for follow-up examination after completed treatment for conditions other than malignant neoplasm: Secondary | ICD-10-CM | POA: Diagnosis not present

## 2024-01-27 DIAGNOSIS — E039 Hypothyroidism, unspecified: Secondary | ICD-10-CM | POA: Diagnosis not present

## 2024-01-27 DIAGNOSIS — Z79899 Other long term (current) drug therapy: Secondary | ICD-10-CM | POA: Diagnosis not present

## 2024-01-27 DIAGNOSIS — R42 Dizziness and giddiness: Secondary | ICD-10-CM | POA: Diagnosis not present

## 2024-01-27 DIAGNOSIS — N184 Chronic kidney disease, stage 4 (severe): Secondary | ICD-10-CM | POA: Diagnosis not present

## 2024-01-27 DIAGNOSIS — I5020 Unspecified systolic (congestive) heart failure: Secondary | ICD-10-CM | POA: Diagnosis not present

## 2024-01-27 DIAGNOSIS — K922 Gastrointestinal hemorrhage, unspecified: Secondary | ICD-10-CM | POA: Diagnosis not present

## 2024-01-29 ENCOUNTER — Telehealth (HOSPITAL_COMMUNITY): Payer: Self-pay

## 2024-01-29 NOTE — Telephone Encounter (Signed)
 Called to confirm/remind patient of their appointment [facility] on 02/01/24 1:30.   Appointment:   [x] Confirmed  [] Left mess   [] No answer/No voice mail  [] VM Full/unable to leave message  [] Phone not in service  Patient reminded to bring all medications and/or complete list.  Confirmed patient has transportation. Gave directions, instructed to utilize valet parking.

## 2024-02-01 ENCOUNTER — Ambulatory Visit (HOSPITAL_COMMUNITY): Payer: Self-pay | Admitting: Cardiology

## 2024-02-01 ENCOUNTER — Ambulatory Visit (HOSPITAL_COMMUNITY)
Admission: RE | Admit: 2024-02-01 | Discharge: 2024-02-01 | Disposition: A | Discharge status: Home / Self Care | Source: Ambulatory Visit | Attending: Cardiology | Admitting: Cardiology

## 2024-02-01 ENCOUNTER — Encounter (HOSPITAL_COMMUNITY): Payer: Self-pay

## 2024-02-01 VITALS — BP 104/68 | HR 57 | Ht 66.0 in | Wt 170.0 lb

## 2024-02-01 DIAGNOSIS — I5042 Chronic combined systolic (congestive) and diastolic (congestive) heart failure: Secondary | ICD-10-CM | POA: Insufficient documentation

## 2024-02-01 DIAGNOSIS — I350 Nonrheumatic aortic (valve) stenosis: Secondary | ICD-10-CM | POA: Insufficient documentation

## 2024-02-01 DIAGNOSIS — Z951 Presence of aortocoronary bypass graft: Secondary | ICD-10-CM | POA: Insufficient documentation

## 2024-02-01 DIAGNOSIS — Z8249 Family history of ischemic heart disease and other diseases of the circulatory system: Secondary | ICD-10-CM | POA: Insufficient documentation

## 2024-02-01 DIAGNOSIS — I480 Paroxysmal atrial fibrillation: Secondary | ICD-10-CM | POA: Insufficient documentation

## 2024-02-01 DIAGNOSIS — I440 Atrioventricular block, first degree: Secondary | ICD-10-CM | POA: Insufficient documentation

## 2024-02-01 DIAGNOSIS — I130 Hypertensive heart and chronic kidney disease with heart failure and stage 1 through stage 4 chronic kidney disease, or unspecified chronic kidney disease: Secondary | ICD-10-CM | POA: Diagnosis not present

## 2024-02-01 DIAGNOSIS — Z952 Presence of prosthetic heart valve: Secondary | ICD-10-CM | POA: Insufficient documentation

## 2024-02-01 DIAGNOSIS — Z7901 Long term (current) use of anticoagulants: Secondary | ICD-10-CM | POA: Insufficient documentation

## 2024-02-01 DIAGNOSIS — D509 Iron deficiency anemia, unspecified: Secondary | ICD-10-CM

## 2024-02-01 DIAGNOSIS — E785 Hyperlipidemia, unspecified: Secondary | ICD-10-CM | POA: Insufficient documentation

## 2024-02-01 DIAGNOSIS — Z87891 Personal history of nicotine dependence: Secondary | ICD-10-CM | POA: Insufficient documentation

## 2024-02-01 DIAGNOSIS — E1122 Type 2 diabetes mellitus with diabetic chronic kidney disease: Secondary | ICD-10-CM | POA: Insufficient documentation

## 2024-02-01 DIAGNOSIS — Z833 Family history of diabetes mellitus: Secondary | ICD-10-CM | POA: Diagnosis not present

## 2024-02-01 DIAGNOSIS — E039 Hypothyroidism, unspecified: Secondary | ICD-10-CM | POA: Diagnosis not present

## 2024-02-01 DIAGNOSIS — I5032 Chronic diastolic (congestive) heart failure: Secondary | ICD-10-CM

## 2024-02-01 DIAGNOSIS — Z79899 Other long term (current) drug therapy: Secondary | ICD-10-CM | POA: Insufficient documentation

## 2024-02-01 DIAGNOSIS — I255 Ischemic cardiomyopathy: Secondary | ICD-10-CM | POA: Diagnosis not present

## 2024-02-01 DIAGNOSIS — I2510 Atherosclerotic heart disease of native coronary artery without angina pectoris: Secondary | ICD-10-CM | POA: Insufficient documentation

## 2024-02-01 DIAGNOSIS — N1832 Chronic kidney disease, stage 3b: Secondary | ICD-10-CM | POA: Diagnosis not present

## 2024-02-01 DIAGNOSIS — Z7984 Long term (current) use of oral hypoglycemic drugs: Secondary | ICD-10-CM | POA: Diagnosis not present

## 2024-02-01 DIAGNOSIS — I252 Old myocardial infarction: Secondary | ICD-10-CM | POA: Insufficient documentation

## 2024-02-01 DIAGNOSIS — Z7982 Long term (current) use of aspirin: Secondary | ICD-10-CM | POA: Insufficient documentation

## 2024-02-01 LAB — FERRITIN: Ferritin: 105 ng/mL (ref 24–336)

## 2024-02-01 LAB — BASIC METABOLIC PANEL WITH GFR
Anion gap: 9 (ref 5–15)
BUN: 54 mg/dL — ABNORMAL HIGH (ref 8–23)
CO2: 21 mmol/L — ABNORMAL LOW (ref 22–32)
Calcium: 9.1 mg/dL (ref 8.9–10.3)
Chloride: 109 mmol/L (ref 98–111)
Creatinine, Ser: 2.8 mg/dL — ABNORMAL HIGH (ref 0.61–1.24)
GFR, Estimated: 24 mL/min — ABNORMAL LOW (ref >60)
Glucose, Bld: 240 mg/dL — ABNORMAL HIGH (ref 70–99)
Potassium: 5.2 mmol/L — ABNORMAL HIGH (ref 3.5–5.1)
Sodium: 139 mmol/L (ref 135–145)

## 2024-02-01 LAB — CBC
HCT: 34.8 % — ABNORMAL LOW (ref 39.0–52.0)
Hemoglobin: 11.3 g/dL — ABNORMAL LOW (ref 13.0–17.0)
MCH: 30.3 pg (ref 26.0–34.0)
MCHC: 32.5 g/dL (ref 30.0–36.0)
MCV: 93.3 fL (ref 80.0–100.0)
Platelets: 280 K/uL (ref 150–400)
RBC: 3.73 MIL/uL — ABNORMAL LOW (ref 4.22–5.81)
RDW: 13.2 % (ref 11.5–15.5)
WBC: 11.2 K/uL — ABNORMAL HIGH (ref 4.0–10.5)
nRBC: 0 % (ref 0.0–0.2)

## 2024-02-01 LAB — IRON AND TIBC
Iron: 46 ug/dL (ref 45–182)
Saturation Ratios: 17 % — ABNORMAL LOW (ref 17.9–39.5)
TIBC: 273 ug/dL (ref 250–450)
UIBC: 227 ug/dL

## 2024-02-01 LAB — BRAIN NATRIURETIC PEPTIDE: B Natriuretic Peptide: 338.6 pg/mL — ABNORMAL HIGH (ref 0.0–100.0)

## 2024-02-01 NOTE — Progress Notes (Signed)
 ADVANCED HF CLINIC NOTE  PCP: Dwayne Ellen, FNP Primary Cardiologist: Ellen Dwayne NOVAK, FNP HF Cardiologist: Dr. Cherrie  Reason for Visit: f/u for Heart Failure   HPI: Antonio Cervantes 68 y.o. male w/ T2DM, remote tobacco use, systolic heart failure/iCM, CAD, AF, and HLD.   Admitted 1/23 for NSTEMI and found to be in acute CHF and afib w/ RVR, of unknown duration but spontaneously converted back to NSR. Echo with severely reduced LVEF, 20-25%, RV normal. LHC showed severe MVCAD, however given diffuse, distal vessel disease, there were no good options for revascularization (Diffuse inoperable CAD, targets not amendable to PCI). RHC showed normal filling pressures and normal CO  5.4 L/min, cardiac index 2.88. Medical therapy for CAD was recommended. Placed on ASA, statin and ? blocker. GDMT limited by soft BP and renal insuffiency, SCr ~1.5 during hospitalization (baseline unknown). Eliquis  started for Afib. Referred to [facility] clinic.    Of note, there was also concern for potential aortic stenosis base on echo interpretation, degree of AS hard to judge due to severe decrease in LV function DVI 0.55 AVA 2 cm2 but morphology and gradients suggest AS could be moderate .   Seen in [facility] 2/23, stable NYHA I-II symptoms. GDMT titrated. Follow up in AHF 3/23 with stable NYHA II symptoms, euvolemic. Started on low-dose losartan .  Re-admitted 3/23 with NSTEMI. Underwent cMRI showing LVEF 47%, normal RVEF, viable multi-vessel disease. Moderate AI. CVTS consulted and underwent CABG x 4 and aortic valve replacement. Course complicated by persistent AF despite amiodarone  and started on Coumadin  (plan to continue for 3 months then switch to Eliquis ). Spiro, beta blocker, and Lasix  stopped with low BP and bradycardia. Discharged home, weight 163 lbs.  Echo 10/18/21 EF normalized, 55-60% G2DD AoV ok.   Follow up 1/24, doing well NYHA II.   Admitted 3/24 with a/c dHF. Diuresed with IV lasix , complicated  by AKI on CKD. Had been taking NSAID for gout. Echo showed EF 55-60%, RV ok. He was discharged home, weight 164 lbs.  Admitted 09/24 with severe anemia, weakness and dyspnea. Hgb down to 6.4 in setting of NSAID use for gout attack. Required transfusions. EGD revealed gastric ulcers with placement of one clip. Colonoscopy okay. Eliquis  and Aspirin  initially held but later restarted. He had elevated troponin up to 1420. Cardiology consulted. Echo with stable EF, 60-65%, RV okay, mean gradient of 12 mmhg across aortic valve prosthesis. Troponin elevation thought to be likely 2/2 demand ischemia in setting of acute blood loss anemia. No further ischemic workup recommended.   Presented to the ED last month, 9/25, w/ complaints of worsening dyspnea and dizziness. In the ED, BP was markedly elevated and w/u revealed acute CHF/pulmonary edema. Hs trops were low level and flat c/w demand ischemia. He was diuresed w/ IV Lasix  and antihypertensives adjusted. Renal artery dopplers showed no RAS. He diuresed well and BP improved. Transitioned to oral labetolol. Recommended to get repeat echo done as outpatient.   He presents to clinic today for post hospital f/u. Here w/ his daughter. Doing fairly ok. No resting dyspnea or orthopnea but reports NYHA Class II symptoms. No LEE. ReDs normal at 28%. Compliant w/ daily lasix  but no longer taking labetalol . His daughter reports he had taken for 2 days post d/c and had to stop b/c his BP was getting too low, in the 90s systolic. He has been checking his BP daily at home and has remained in the low 100s. BP today 104/68. EKG shows NSR w/  1st deg AVB.  His main compliant is low energy. Stays tired all of the time. Denies melena and hematochezia. Denies CP. Says he saw his PCP last week and was told his thyroid  fx was low and dose of levothyroxine  was increased.    Cardiac Testing  - Echo (09/24): EF 60-65%, RV okay, mean gradient of 12 across aortic valve prosthesis   -  Echo (3/24): EF 55-60%, RV ok, AoV ok  - Echo 6/23 EF 55-60%, GDDD, AoV ok   - cMRI (3/23): LVEF 47%, normal RVEF, mild to moderate AI, study suggests viable multi-vessel disease.  - Echo (3/23): EF 50%, mild LVH, RV ok, mild AI  - Echo (1/23): EF 20-25%, severe LV dysfunction, mild LVH, RV ok, mild MR, degree of AS hard to judge due to severe decrease in LV function DVI 0.55 AVA 2 cm2 but morphology and gradients suggest AS could be moderate .   - LHC/RHC (05/21/21):   Prox LAD to Mid LAD lesion is 75% stenosed.   Dist LAD lesion is 80% stenosed.   1st Diag lesion is 80% stenosed.   Ost RCA to Mid RCA lesion is 99% stenosed.   2nd Mrg lesion is 99% stenosed.   Dist Cx lesion is 90% stenosed.   Prox Cx to Mid Cx lesion is 75% stenosed.   LV end diastolic pressure is normal.   There is no aortic valve stenosis.   Severe, multivessel coronary artery disease.   Normal LVEDP. Given diffuse, distal vessel disease, there are no good options for revascularization.   Right heart pressures: RA mean 2 PA pressure 26/10 mean 17 PCWP mean 6 CO/CI 5.4/2.88 (Fick)  ROS: All systems reviewed and negative except as per HPI.   Past Medical History:  Diagnosis Date   Atrial fibrillation and flutter (HCC)    Blood transfusion without reported diagnosis    Cardiac arrhythmia due to congenital heart disease    Cataract    CHF (congestive heart failure) (HCC)    CKD (chronic kidney disease)    Diabetes mellitus without complication (HCC)    Diabetic peripheral neuropathy associated with type 2 diabetes mellitus (HCC)    Heart failure (HCC)    High blood cholesterol    Hypertension    Myocardial infarction (HCC)    08/06/2021   Thyroid  disease    Current Outpatient Medications  Medication Sig Dispense Refill   aspirin  EC 81 MG tablet Take 1 tablet (81 mg total) by mouth daily. Swallow whole.     atorvastatin  (LIPITOR ) 80 MG tablet Take 1 tablet (80 mg total) by mouth daily. 30 tablet 0    dapagliflozin  propanediol (FARXIGA ) 10 MG TABS tablet Take 1 tablet (10 mg total) by mouth daily before breakfast. 90 tablet 3   ELIQUIS  5 MG TABS tablet TAKE 1 TABLET BY MOUTH TWICE A DAY (Patient taking differently: Take 5 mg by mouth daily at 6 (six) AM.) 60 tablet 5   famotidine  (PEPCID ) 20 MG tablet TAKE 1 TABLET BY MOUTH EVERYDAY AT BEDTIME 90 tablet 1   furosemide  (LASIX ) 20 MG tablet Take 1 tablet (20 mg total) by mouth daily. 30 tablet 0   glipiZIDE (GLUCOTROL) 10 MG tablet Take 10 mg by mouth daily at 6 (six) AM.     labetalol  (NORMODYNE ) 100 MG tablet Take 1 tablet (100 mg total) by mouth 2 (two) times daily. 60 tablet 0   levothyroxine  (SYNTHROID ) 137 MCG tablet Take 137 mcg by mouth at bedtime. (Patient taking differently:  Take 188 mcg by mouth at bedtime.)     mupirocin  ointment (BACTROBAN ) 2 % Apply 1 Application topically 2 (two) times daily as needed.     nitroGLYCERIN  (NITROSTAT ) 0.4 MG SL tablet Place 1 tablet (0.4 mg total) under the tongue every 5 (five) minutes as needed for chest pain. 25 tablet 3   omeprazole  (PRILOSEC) 40 MG capsule Take 40 mg twice daily for 4 weeks and then go down to once daily (Patient taking differently: Take 40 mg by mouth daily.) 90 capsule 3   Current Facility-Administered Medications  Medication Dose Route Frequency Provider Last Rate Last Admin   0.9 %  sodium chloride  infusion  500 mL Intravenous Once Charlanne Groom, MD       No Known Allergies  Social History   Socioeconomic History   Marital status: Divorced    Spouse name: Not on file   Number of children: 1   Years of education: Not on file   Highest education level: 10th grade  Occupational History   Occupation: retired  Tobacco Use   Smoking status: Former    Current packs/day: 0.00    Average packs/day: 1.5 packs/day for 15.0 years (22.5 ttl pk-yrs)    Types: Cigarettes    Start date: 48    Quit date: 1983    Years since quitting: 42.7   Smokeless tobacco: Never  Vaping  Use   Vaping status: Never Used  Substance and Sexual Activity   Alcohol use: No   Drug use: No   Sexual activity: Not on file  Other Topics Concern   Not on file  Social History Narrative   Not on file   Social Drivers of Health   Financial Resource Strain: Low Risk  (12/03/2023)   Received from [facility]   Overall Financial Resource Strain (CARDIA)    Difficulty of Paying Living Expenses: Not hard at all  Food Insecurity: No Food Insecurity (01/20/2024)   Hunger Vital Sign    Worried About Running Out of Food in the Last Year: Never true    Ran Out of Food in the Last Year: Never true  Transportation Needs: No Transportation Needs (01/20/2024)   PRAPARE - Administrator, Civil Service (Medical): No    Lack of Transportation (Non-Medical): No  Physical Activity: Not on file  Stress: Not on file  Social Connections: Unknown (01/20/2024)   Social Connection and Isolation Panel    Frequency of Communication with Friends and Family: More than three times a week    Frequency of Social Gatherings with Friends and Family: More than three times a week    Attends Religious Services: More than 4 times per year    Active Member of [employer] or Organizations: Not on file    Attends Banker Meetings: Not on file    Marital Status: Not on file  Intimate Partner Violence: Unknown (01/20/2024)   Humiliation, Afraid, Rape, and Kick questionnaire    Fear of Current or Ex-Partner: No    Emotionally Abused: No    Physically Abused: Not on file    Sexually Abused: Not on file   Family History  Problem Relation Age of Onset   Diabetes Mother    Diabetes Father    Throat cancer Brother    Heart attack Maternal Uncle    Sudden Cardiac Death Neg Hx    Liver disease Neg Hx    Colon cancer Neg Hx    Colon polyps Neg Hx  Esophageal cancer Neg Hx    Stomach cancer Neg Hx    Rectal cancer Neg Hx    Wt Readings from Last 3 Encounters:  02/01/24 77.1 kg  (170 lb)  01/21/24 76.5 kg (168 lb 11.2 oz)  12/17/23 74.9 kg (165 lb 3.2 oz)   BP 104/68   Pulse (!) 57   Ht 5' 6 (1.676 m)   Wt 77.1 kg (170 lb)   SpO2 92%   BMI 27.44 kg/m   PHYSICAL EXAM: ReDs 28%, normal  GENERAL: fatigued appearing. NAD Lungs- clear  CARDIAC:  JVP not elevated         Normal rate with regular rhythm. No MRG, no LEE  ABDOMEN: Soft, non-tender, non-distended.  EXTREMITIES: Warm and well perfused.  NEUROLOGIC: No obvious FND   ECG (personally reviewed):  sinus bradycardia 57 bpm     ASSESSMENT & PLAN:  1. Chronic Systolic Heart Failure>>Chronic Diastolic CHF - Ischemic CM.  - Echo 1/23 EF 20-25%, RV ok - LHC (1/23): w/ severe MVCAD. RHC w/ normal filling pressures and preserved CO 5.4 L/min, cardiac index 2.88 - Echo (3/23): improved EF 50%, mild LVH, RV ok, mild AI - cMRI (3/23): LVEF 47%, RVEF 48%, viable multi-vessel disease, moderate AI - s/p CABG 4/23 - Echo 6/23 EF  normalized, 55-60% G2DD Aov ok   - Echo (3/24): EF 55-60%, RV ok - Echo (09/24): EF 60-65%, RV okay - NYHA II, limited more by fatigue. Euvolemic on exam and by ReDs, 28%  - GDMT limited by hyperkalemia and CKD - Continue Farxiga  10 mg daily - Not on spiro with CKD and hyperkalemia. - Off losartan  with worsening renal function and hyperkalemia (failed x 2). Would not retry  - off ? blocker w/ h/o low BP and bradycardia  - check BMP and BNP today    2. CAD - NSTEMI 1/23, LHC w/ severe, diffuse MVCAD--> medically managed. - NSTEMI 3/23, s/p CABG x 4, LIMA to LAD, SVG to diagonal, SVG to OM, SVG to distal RCA and AVR - denies CP  - Continue ASA 81 + atorva 80 daily  - Goal LDL < 70  Managed by PCP   3. PAF - EKG shows sinus brady, 57 bpm  - off ? blocker w/ h/o bradycardia - Continue Eliquis  5 mg bid. He denies gross bleeding. Given fatigue, will check CBC and Iron studies   4. Aortic Stenosis  - s/p aortic valve replacement, stable on echo 09/24 - Aware of SBE  prophylaxis  - repeat echo per above   5. CKD IIIb - most recent baseline SCr ~2.6-2.8 - on SGLT2i - check BMP today  - refer to nephrology    6. Type 2DM - followed by PCP  - Continue SGLT2i   7. HLD  - Continue atorva 80 mg  8. Remote Tobacco Use - Quit 40 years ago  10. Hypothyroidism - suspect contributing to fatigue - PCP following and levothyroxine  dose has just been increased by PCP    F/u w/ Dr. Bensimhon in 4-6 months. Will bring back sooner if echo abnormal.    Caffie Shed, PA-C  1:31 PM

## 2024-02-01 NOTE — Progress Notes (Signed)
 ReDS Vest / Clip - 02/01/24 0923       ReDS Vest / Clip   Station Marker C    Ruler Value 38.5    ReDS Value Range Low volume    ReDS Actual Value 28

## 2024-02-01 NOTE — Patient Instructions (Signed)
 Medication Changes:  None, continue current medications  Lab Work:  Labs done today, your results will be available in MyChart, we will contact you for abnormal readings.  Testing/Procedures:  Your physician has requested that you have an echocardiogram. Echocardiography is a painless test that uses sound waves to create images of your heart. It provides your doctor with information about the size and shape of your heart and how well your heart's chambers and valves are working. This procedure takes approximately one hour. There are no restrictions for this procedure. Please do NOT wear cologne, perfume, aftershave, or lotions (deodorant is allowed). Please arrive 15 minutes prior to your appointment time.  Please note: We ask at that you not bring children with you during ultrasound (echo/ vascular) testing. Due to room size and safety concerns, children are not allowed in the ultrasound rooms during exams. Our front office staff cannot provide observation of children in our lobby area while testing is being conducted. An adult accompanying a patient to their appointment will only be allowed in the ultrasound room at the discretion of the ultrasound technician under special circumstances. We apologize for any inconvenience.   Special Instructions // Education:  Do the following things EVERYDAY: Weigh yourself in the morning before breakfast. Write it down and keep it in a log. Take your medicines as prescribed Eat low salt foods--Limit salt (sodium) to 2000 mg per day.  Stay as active as you can everyday Limit all fluids for the day to less than 2 liters   Follow-Up in: 3 months (January 2026), **PLEASE CALL IN DECEMBER TO SCHEDULE THIS APPOINTMENT   At the [facility], you and your health needs are our priority. We have a designated team specialized in the treatment of Heart Failure. This Care Team includes your primary Heart Failure Specialized Cardiologist  (physician), Advanced Practice Providers (APPs- Physician Assistants and Nurse Practitioners), and Pharmacist who all work together to provide you with the care you need, when you need it.   You may see any of the following providers on your designated Care Team at your next follow up:  Dr. Toribio Fuel Dr. Ezra Shuck Dr. Ria Commander Dr. Odis Brownie Greig Mosses, NP Caffie Shed, [place] [facility] [place], [place] Beckey Coe, NP [place] Lee, NP Tinnie Redman, PharmD   Please be sure to bring in all your medications bottles to every appointment.   Need to Contact Us :  If you have any questions or concerns before your next appointment please send us  a message through [place] or call our office at [phone].    TO LEAVE A MESSAGE FOR THE NURSE SELECT OPTION 2, PLEASE LEAVE A MESSAGE INCLUDING: YOUR NAME DATE OF BIRTH CALL BACK NUMBER REASON FOR CALL**this is important as we prioritize the call backs  YOU WILL RECEIVE A CALL BACK THE SAME DAY AS LONG AS YOU CALL BEFORE 4:00 PM

## 2024-02-02 ENCOUNTER — Telehealth (HOSPITAL_COMMUNITY): Payer: Self-pay

## 2024-02-02 ENCOUNTER — Telehealth: Payer: Self-pay | Admitting: Pharmacy Technician

## 2024-02-02 DIAGNOSIS — D509 Iron deficiency anemia, unspecified: Secondary | ICD-10-CM | POA: Insufficient documentation

## 2024-02-02 NOTE — Telephone Encounter (Signed)
 Auth Submission: NO AUTH NEEDED Site of care: Site of care: [facility] Payer: [employer]HC DUAL Medication & CPT/J Code(s) submitted: Feraheme (ferumoxytol) [id number] Diagnosis Code:  Route of submission (phone, fax, portal):  Phone # Fax # Auth type: Buy/Bill PB Units/visits requested: X2 DOSES Reference number:  Approval from: 02/02/24 to 04/27/24

## 2024-02-02 NOTE — Telephone Encounter (Signed)
 Patient referred to infusion pharmacy team for ambulatory infusion of IV iron.  Insurance - UHC  Site of care - Site of care: [facility] Dx code - D50.9  IV Iron Therapy - Feraheme 510 mg Iv x 2  Infusion appointments - Scheduling team will schedule patient as soon as possible.   Jacquelene Kopecky D. Marnesha Gagen, PharmD

## 2024-02-04 DIAGNOSIS — I480 Paroxysmal atrial fibrillation: Secondary | ICD-10-CM | POA: Diagnosis not present

## 2024-02-04 DIAGNOSIS — E1122 Type 2 diabetes mellitus with diabetic chronic kidney disease: Secondary | ICD-10-CM | POA: Diagnosis not present

## 2024-02-04 DIAGNOSIS — N184 Chronic kidney disease, stage 4 (severe): Secondary | ICD-10-CM | POA: Diagnosis not present

## 2024-02-04 DIAGNOSIS — I129 Hypertensive chronic kidney disease with stage 1 through stage 4 chronic kidney disease, or unspecified chronic kidney disease: Secondary | ICD-10-CM | POA: Diagnosis not present

## 2024-02-04 DIAGNOSIS — N179 Acute kidney failure, unspecified: Secondary | ICD-10-CM | POA: Diagnosis not present

## 2024-02-04 DIAGNOSIS — N2581 Secondary hyperparathyroidism of renal origin: Secondary | ICD-10-CM | POA: Diagnosis not present

## 2024-02-04 DIAGNOSIS — I5032 Chronic diastolic (congestive) heart failure: Secondary | ICD-10-CM | POA: Diagnosis not present

## 2024-02-04 DIAGNOSIS — Z952 Presence of prosthetic heart valve: Secondary | ICD-10-CM | POA: Diagnosis not present

## 2024-02-04 DIAGNOSIS — M109 Gout, unspecified: Secondary | ICD-10-CM | POA: Diagnosis not present

## 2024-02-05 ENCOUNTER — Encounter (HOSPITAL_COMMUNITY)

## 2024-02-10 ENCOUNTER — Ambulatory Visit (HOSPITAL_COMMUNITY)
Admission: RE | Admit: 2024-02-10 | Discharge: 2024-02-10 | Disposition: A | Discharge status: Home / Self Care | Source: Ambulatory Visit | Attending: *Deleted | Admitting: *Deleted

## 2024-02-10 DIAGNOSIS — Z951 Presence of aortocoronary bypass graft: Secondary | ICD-10-CM | POA: Insufficient documentation

## 2024-02-10 DIAGNOSIS — I359 Nonrheumatic aortic valve disorder, unspecified: Secondary | ICD-10-CM | POA: Diagnosis present

## 2024-02-10 DIAGNOSIS — Z952 Presence of prosthetic heart valve: Secondary | ICD-10-CM | POA: Diagnosis not present

## 2024-02-10 DIAGNOSIS — I2510 Atherosclerotic heart disease of native coronary artery without angina pectoris: Secondary | ICD-10-CM | POA: Insufficient documentation

## 2024-02-10 DIAGNOSIS — I5032 Chronic diastolic (congestive) heart failure: Secondary | ICD-10-CM | POA: Insufficient documentation

## 2024-02-10 LAB — ECHOCARDIOGRAM COMPLETE
AR max vel: 1.58 cm2
AV Area VTI: 1.81 cm2
AV Area mean vel: 1.96 cm2
AV Mean grad: 10.7 mmHg
AV Peak grad: 26.4 mmHg
Ao pk vel: 2.57 m/s
Area-P 1/2: 5.88 cm2
Calc EF: 60.2 %
S' Lateral: 2.3 cm
Single Plane A2C EF: 56.3 %
Single Plane A4C EF: 66.5 %

## 2024-02-15 DIAGNOSIS — E1142 Type 2 diabetes mellitus with diabetic polyneuropathy: Secondary | ICD-10-CM | POA: Diagnosis not present

## 2024-02-15 DIAGNOSIS — I129 Hypertensive chronic kidney disease with stage 1 through stage 4 chronic kidney disease, or unspecified chronic kidney disease: Secondary | ICD-10-CM | POA: Diagnosis not present

## 2024-02-15 DIAGNOSIS — Z23 Encounter for immunization: Secondary | ICD-10-CM | POA: Diagnosis not present

## 2024-02-15 DIAGNOSIS — E1122 Type 2 diabetes mellitus with diabetic chronic kidney disease: Secondary | ICD-10-CM | POA: Diagnosis not present

## 2024-02-15 DIAGNOSIS — N189 Chronic kidney disease, unspecified: Secondary | ICD-10-CM | POA: Diagnosis not present

## 2024-02-15 DIAGNOSIS — N184 Chronic kidney disease, stage 4 (severe): Secondary | ICD-10-CM | POA: Diagnosis not present

## 2024-02-15 DIAGNOSIS — E039 Hypothyroidism, unspecified: Secondary | ICD-10-CM | POA: Diagnosis not present

## 2024-02-15 DIAGNOSIS — E559 Vitamin D deficiency, unspecified: Secondary | ICD-10-CM | POA: Diagnosis not present

## 2024-02-15 DIAGNOSIS — N2581 Secondary hyperparathyroidism of renal origin: Secondary | ICD-10-CM | POA: Diagnosis not present

## 2024-02-15 DIAGNOSIS — N182 Chronic kidney disease, stage 2 (mild): Secondary | ICD-10-CM | POA: Diagnosis not present

## 2024-02-16 ENCOUNTER — Encounter (HOSPITAL_COMMUNITY)
Admission: RE | Admit: 2024-02-16 | Discharge: 2024-02-16 | Disposition: A | Discharge status: Home / Self Care | Source: Ambulatory Visit | Attending: Cardiology | Admitting: Cardiology

## 2024-02-16 VITALS — BP 131/74 | HR 73 | Temp 97.5°F | Resp 16

## 2024-02-16 DIAGNOSIS — D509 Iron deficiency anemia, unspecified: Secondary | ICD-10-CM | POA: Diagnosis present

## 2024-02-16 MED ORDER — SODIUM CHLORIDE 0.9 % IV SOLN
510.0000 mg | Freq: Once | INTRAVENOUS | Status: AC
  Administered 2024-02-16: 510 mg via INTRAVENOUS
  Filled 2024-02-16: qty 510

## 2024-02-23 ENCOUNTER — Encounter (HOSPITAL_COMMUNITY)
Admission: RE | Admit: 2024-02-23 | Discharge: 2024-02-23 | Disposition: A | Discharge status: Home / Self Care | Source: Ambulatory Visit | Attending: Cardiology | Admitting: Cardiology

## 2024-02-23 VITALS — BP 163/80 | HR 74 | Temp 97.7°F | Resp 16

## 2024-02-23 DIAGNOSIS — D509 Iron deficiency anemia, unspecified: Secondary | ICD-10-CM | POA: Diagnosis not present

## 2024-02-23 MED ORDER — SODIUM CHLORIDE 0.9 % IV SOLN
510.0000 mg | Freq: Once | INTRAVENOUS | Status: AC
  Administered 2024-02-23: 510 mg via INTRAVENOUS
  Filled 2024-02-23: qty 510

## 2024-03-01 ENCOUNTER — Inpatient Hospital Stay (HOSPITAL_COMMUNITY): Admission: RE | Admit: 2024-03-01 | Payer: Medicare Other | Source: Ambulatory Visit

## 2024-04-11 ENCOUNTER — Encounter (HOSPITAL_BASED_OUTPATIENT_CLINIC_OR_DEPARTMENT_OTHER): Admitting: Internal Medicine

## 2024-04-12 ENCOUNTER — Encounter (HOSPITAL_BASED_OUTPATIENT_CLINIC_OR_DEPARTMENT_OTHER): Attending: General Surgery | Admitting: General Surgery

## 2024-04-12 DIAGNOSIS — E1122 Type 2 diabetes mellitus with diabetic chronic kidney disease: Secondary | ICD-10-CM | POA: Diagnosis not present

## 2024-04-12 DIAGNOSIS — E1142 Type 2 diabetes mellitus with diabetic polyneuropathy: Secondary | ICD-10-CM | POA: Diagnosis not present

## 2024-04-12 DIAGNOSIS — I480 Paroxysmal atrial fibrillation: Secondary | ICD-10-CM | POA: Diagnosis not present

## 2024-04-12 DIAGNOSIS — N184 Chronic kidney disease, stage 4 (severe): Secondary | ICD-10-CM | POA: Insufficient documentation

## 2024-04-12 DIAGNOSIS — E11622 Type 2 diabetes mellitus with other skin ulcer: Secondary | ICD-10-CM | POA: Diagnosis present

## 2024-04-12 DIAGNOSIS — L98A322 Non-pressure chronic ulcer of left hand with fat layer exposed: Secondary | ICD-10-CM | POA: Diagnosis not present

## 2024-04-12 DIAGNOSIS — I5020 Unspecified systolic (congestive) heart failure: Secondary | ICD-10-CM | POA: Diagnosis not present

## 2024-04-12 DIAGNOSIS — I5032 Chronic diastolic (congestive) heart failure: Secondary | ICD-10-CM

## 2024-05-02 ENCOUNTER — Encounter (HOSPITAL_COMMUNITY): Payer: Self-pay | Admitting: Internal Medicine

## 2024-05-02 ENCOUNTER — Ambulatory Visit (HOSPITAL_COMMUNITY)
Admission: RE | Admit: 2024-05-02 | Discharge: 2024-05-02 | Disposition: A | Discharge status: Home / Self Care | Source: Ambulatory Visit | Attending: Internal Medicine | Admitting: Internal Medicine

## 2024-05-02 VITALS — BP 122/70 | HR 82 | Wt 162.0 lb

## 2024-05-02 DIAGNOSIS — I5020 Unspecified systolic (congestive) heart failure: Secondary | ICD-10-CM | POA: Diagnosis present

## 2024-05-02 DIAGNOSIS — Z79899 Other long term (current) drug therapy: Secondary | ICD-10-CM | POA: Diagnosis not present

## 2024-05-02 DIAGNOSIS — E785 Hyperlipidemia, unspecified: Secondary | ICD-10-CM | POA: Insufficient documentation

## 2024-05-02 DIAGNOSIS — Z951 Presence of aortocoronary bypass graft: Secondary | ICD-10-CM | POA: Insufficient documentation

## 2024-05-02 DIAGNOSIS — Z7984 Long term (current) use of oral hypoglycemic drugs: Secondary | ICD-10-CM | POA: Insufficient documentation

## 2024-05-02 DIAGNOSIS — I130 Hypertensive heart and chronic kidney disease with heart failure and stage 1 through stage 4 chronic kidney disease, or unspecified chronic kidney disease: Secondary | ICD-10-CM | POA: Diagnosis not present

## 2024-05-02 DIAGNOSIS — N1832 Chronic kidney disease, stage 3b: Secondary | ICD-10-CM | POA: Insufficient documentation

## 2024-05-02 DIAGNOSIS — I480 Paroxysmal atrial fibrillation: Secondary | ICD-10-CM | POA: Insufficient documentation

## 2024-05-02 DIAGNOSIS — Z79890 Hormone replacement therapy (postmenopausal): Secondary | ICD-10-CM | POA: Diagnosis not present

## 2024-05-02 DIAGNOSIS — E1122 Type 2 diabetes mellitus with diabetic chronic kidney disease: Secondary | ICD-10-CM | POA: Insufficient documentation

## 2024-05-02 DIAGNOSIS — E039 Hypothyroidism, unspecified: Secondary | ICD-10-CM | POA: Insufficient documentation

## 2024-05-02 DIAGNOSIS — Z87891 Personal history of nicotine dependence: Secondary | ICD-10-CM | POA: Insufficient documentation

## 2024-05-02 DIAGNOSIS — I2510 Atherosclerotic heart disease of native coronary artery without angina pectoris: Secondary | ICD-10-CM | POA: Insufficient documentation

## 2024-05-02 DIAGNOSIS — I4819 Other persistent atrial fibrillation: Secondary | ICD-10-CM | POA: Diagnosis not present

## 2024-05-02 DIAGNOSIS — I255 Ischemic cardiomyopathy: Secondary | ICD-10-CM | POA: Insufficient documentation

## 2024-05-02 DIAGNOSIS — Z952 Presence of prosthetic heart valve: Secondary | ICD-10-CM | POA: Diagnosis not present

## 2024-05-02 DIAGNOSIS — I5022 Chronic systolic (congestive) heart failure: Secondary | ICD-10-CM | POA: Diagnosis not present

## 2024-05-02 DIAGNOSIS — I252 Old myocardial infarction: Secondary | ICD-10-CM | POA: Insufficient documentation

## 2024-05-02 DIAGNOSIS — Z7982 Long term (current) use of aspirin: Secondary | ICD-10-CM | POA: Insufficient documentation

## 2024-05-02 MED ORDER — AMOXICILLIN 500 MG PO TABS: 2000.0000 mg | ORAL_TABLET | ORAL | 3 refills | Status: AC | PRN

## 2024-05-02 NOTE — Patient Instructions (Addendum)
 Medication Changes:  NONE, continue current medications  Make sure to take Amoxicillin  2000 mg (4 tabs) 1 hour before dental work, a refill has been sent in for this    Special Instructions // Education:  Do the following things EVERYDAY: Weigh yourself in the morning before breakfast. Write it down and keep it in a log. Take your medicines as prescribed Eat low salt foods--Limit salt (sodium) to 2000 mg per day.  Stay as active as you can everyday Limit all fluids for the day to less than 2 liters   Follow-Up in: AS NEEDED, please follow-up with Dr Jeffrie in April    At the [facility], you and your health needs are our priority. We have a designated team specialized in the treatment of Heart Failure. This Care Team includes your primary Heart Failure Specialized Cardiologist (physician), Advanced Practice Providers (APPs- Physician Assistants and Nurse Practitioners), and Pharmacist who all work together to provide you with the care you need, when you need it.   You may see any of the following providers on your designated Care Team at your next follow up:  Dr. Toribio Fuel Dr. Ezra Shuck Dr. Odis Brownie Greig Mosses, NP Caffie Shed, [place] [facility] [place], [place] Beckey Coe, NP [place] Lee, NP Tinnie Redman, PharmD   Please be sure to bring in all your medications bottles to every appointment.   Need to Contact Us :  If you have any questions or concerns before your next appointment please send us  a message through [place] or call our office at [phone].    TO LEAVE A MESSAGE FOR THE NURSE SELECT OPTION 2, PLEASE LEAVE A MESSAGE INCLUDING: YOUR NAME DATE OF BIRTH CALL BACK NUMBER REASON FOR CALL**this is important as we prioritize the call backs  YOU WILL RECEIVE A CALL BACK THE SAME DAY AS LONG AS YOU CALL BEFORE 4:00 PM

## 2024-05-02 NOTE — Progress Notes (Signed)
 "  ADVANCED HF CLINIC NOTE  PCP: Dwayne Ellen, FNP Primary Cardiologist: Ellen Dwayne NOVAK, FNP HF Cardiologist: Dr. Cherrie  Reason for Visit: f/u for Heart Failure   HPI: Antonio Cervantes 69 y.o. male w/ T2DM, remote tobacco use, systolic heart failure/iCM, CAD, AF, and HLD.   Admitted 1/23 for NSTEMI and found to be in acute CHF and afib w/ RVR, of unknown duration but spontaneously converted back to NSR. Echo LVEF, 20-25%, RV normal. LHC showed severe MVCAD, however given diffuse, distal vessel disease, there were no good options for revascularization. RHC showed normal filling pressures and normal CO  5.4 L/min, cardiac index 2.88. Medical therapy for CAD was recommended.    Of note, there was also concern for potential aortic stenosis base on echo interpretation, degree of AS hard to judge due to severe decrease in LV function DVI 0.55 AVA 2 cm2 but morphology and gradients suggest AS could be moderate .  Re-admitted 3/23 with NSTEMI. Underwent cMRI showing LVEF 47%, normal RVEF, viable multi-vessel disease. Moderate AI. CVTS consulted and underwent CABG x 4 and aortic valve replacement. Course complicated by persistent AF despite amiodarone   Echo 10/18/21 EF normalized, 55-60% G2DD AoV ok.   Admitted 3/24 with a/c dHF. Diuresed with IV lasix , complicated by AKI on CKD. Had been taking NSAID for gout. Echo showed EF 55-60%, RV ok. He was discharged home, weight 164 lbs.  Admitted 09/24 with severe anemia, weakness and dyspnea. Hgb down to 6.4 in setting of NSAID use for gout attack. Required transfusions. EGD revealed gastric ulcers with placement of one clip. Colonoscopy okay. Echo with stable EF, 60-65%, RV okay, mean gradient of 12 mmhg across aortic valve prosthesis.   Echo 10/25 EF 55-60% AoV ok   Presented to the ED 9/25, w/ complaints of worsening dyspnea and dizziness. In the ED, BP was markedly elevated and w/u revealed acute CHF/pulmonary edema.   Here for f/u with  his daughter. Says his heart has been fine. Having neuropathy and knee pain. Seeing Rheum today. No CP or SOB. No bleeding with Eliquis .     ROS: All systems reviewed and negative except as per HPI.   Past Medical History:  Diagnosis Date   Atrial fibrillation and flutter (HCC)    Blood transfusion without reported diagnosis    Cardiac arrhythmia due to congenital heart disease    Cataract    CHF (congestive heart failure) (HCC)    CKD (chronic kidney disease)    Diabetes mellitus without complication (HCC)    Diabetic peripheral neuropathy associated with type 2 diabetes mellitus (HCC)    Heart failure (HCC)    High blood cholesterol    Hypertension    Myocardial infarction (HCC)    08/06/2021   Thyroid  disease    Current Outpatient Medications  Medication Sig Dispense Refill   aspirin  EC 81 MG tablet Take 1 tablet (81 mg total) by mouth daily. Swallow whole.     atorvastatin  (LIPITOR ) 80 MG tablet Take 1 tablet (80 mg total) by mouth daily. 30 tablet 0   dapagliflozin  propanediol (FARXIGA ) 10 MG TABS tablet Take 1 tablet (10 mg total) by mouth daily before breakfast. 90 tablet 3   ELIQUIS  5 MG TABS tablet TAKE 1 TABLET BY MOUTH TWICE A DAY (Patient taking differently: Take 5 mg by mouth daily.) 60 tablet 5   famotidine  (PEPCID ) 20 MG tablet TAKE 1 TABLET BY MOUTH EVERYDAY AT BEDTIME 90 tablet 1   furosemide  (LASIX ) 20 MG tablet Take  1 tablet (20 mg total) by mouth daily. 30 tablet 0   gabapentin (NEURONTIN) 300 MG capsule Take 300 mg by mouth 3 (three) times daily.     glipiZIDE (GLUCOTROL) 10 MG tablet Take 10 mg by mouth daily at 6 (six) AM.     indomethacin  (INDOCIN ) 25 MG capsule Take 25 mg by mouth as needed.     levothyroxine  (SYNTHROID ) 175 MCG tablet Take 175 mcg by mouth daily before breakfast.     mupirocin  ointment (BACTROBAN ) 2 % Apply 1 Application topically 2 (two) times daily as needed.     nitroGLYCERIN  (NITROSTAT ) 0.4 MG SL tablet Place 1 tablet (0.4 mg total)  under the tongue every 5 (five) minutes as needed for chest pain. 25 tablet 3   omeprazole  (PRILOSEC) 40 MG capsule Take 40 mg twice daily for 4 weeks and then go down to once daily 90 capsule 3   labetalol  (NORMODYNE ) 100 MG tablet Take 1 tablet (100 mg total) by mouth 2 (two) times daily. (Patient not taking: Reported on 05/02/2024) 60 tablet 0   Current Facility-Administered Medications  Medication Dose Route Frequency Provider Last Rate Last Admin   0.9 %  sodium chloride  infusion  500 mL Intravenous Once Charlanne Groom, MD       No Known Allergies  Social History   Socioeconomic History   Marital status: Divorced    Spouse name: Not on file   Number of children: 1   Years of education: Not on file   Highest education level: 10th grade  Occupational History   Occupation: retired  Tobacco Use   Smoking status: Former    Current packs/day: 0.00    Average packs/day: 1.5 packs/day for 15.0 years (22.5 ttl pk-yrs)    Types: Cigarettes    Start date: 43    Quit date: 1983    Years since quitting: 43.0   Smokeless tobacco: Never  Vaping Use   Vaping status: Never Used  Substance and Sexual Activity   Alcohol use: No   Drug use: No   Sexual activity: Not on file  Other Topics Concern   Not on file  Social History Narrative   Not on file   Social Drivers of Health   Tobacco Use: Medium Risk (05/02/2024)   Patient History    Smoking Tobacco Use: Former    Smokeless Tobacco Use: Never    Passive Exposure: Not on Actuary Strain: Low Risk  (03/07/2024)   Received from [facility]   Overall Financial Resource Strain (CARDIA)    Difficulty of Paying Living Expenses: Not very hard  Food Insecurity: No Food Insecurity (03/07/2024)   Received from [facility]   Epic    Within the past 12 months, you worried that your food would run out before you got the money to buy more.: Never true    Within the past 12 months, the food  you bought just didn't last and you didn't have money to get more.: Never true  Transportation Needs: No Transportation Needs (03/07/2024)   Received from [facility] - Transportation    In the past 12 months, has lack of transportation kept you from medical appointments or from getting medications?: No    Lack of Transportation (Non-Medical): No  Physical Activity: Not on file  Stress: Not on file  Social Connections: Unknown (01/20/2024)   Social Connection and Isolation Panel    Frequency of Communication with Friends  and Family: More than three times a week    Frequency of Social Gatherings with Friends and Family: More than three times a week    Attends Religious Services: More than 4 times per year    Active Member of Clubs or Organizations: Not on file    Attends Banker Meetings: Not on file    Marital Status: Not on file  Intimate Partner Violence: Unknown (01/20/2024)   Epic    Fear of Current or Ex-Partner: No    Emotionally Abused: No    Physically Abused: Not on file    Sexually Abused: Not on file  Depression (EYV7-0): Not on file  Alcohol Screen: Low Risk (05/21/2021)   Alcohol Screen    Last Alcohol Screening Score (AUDIT): 0  Housing: Low Risk  (03/07/2024)   Received from [facility]   Epic    In the last 12 months, was there a time when you were not able to pay the mortgage or rent on time?: No    In the past 12 months, how many times have you moved where you were living?: 0    At any time in the past 12 months, were you homeless or living in a shelter (including now)?: No  Utilities: Not At Risk (03/07/2024)   Received from [facility]   Epic    In the past 12 months has the electric, gas, oil, or water company threatened to shut off services in your home?: No  Health Literacy: Not on file   Family History  Problem Relation Age of Onset   Diabetes Mother    Diabetes Father    Throat  cancer Brother    Heart attack Maternal Uncle    Sudden Cardiac Death Neg Hx    Liver disease Neg Hx    Colon cancer Neg Hx    Colon polyps Neg Hx    Esophageal cancer Neg Hx    Stomach cancer Neg Hx    Rectal cancer Neg Hx    Wt Readings from Last 3 Encounters:  05/02/24 73.5 kg (162 lb)  02/01/24 77.1 kg (170 lb)  01/21/24 76.5 kg (168 lb 11.2 oz)   BP 122/70   Pulse 82   Wt 73.5 kg (162 lb)   SpO2 100%   BMI 26.15 kg/m   PHYSICAL EXAM: General:  Sitting up. No resp difficulty HEENT: normal Neck: supple. no JVD.  Cor: Regular rate & rhythm. No rubs, gallops or murmurs. Lungs: clear Abdomen: soft, nontender, nondistended.Good bowel sounds. Extremities: no cyanosis, clubbing, rash, edema Neuro: alert & orientedx3, cranial nerves grossly intact. moves all 4 extremities w/o difficulty. Affect pleasant   ECG (personally reviewed): Sinus 86 1AVB ( ) Non specific STs. Personally reviewed   ASSESSMENT & PLAN:  1. Chronic Systolic Heart Failure>>Chronic Diastolic CHF - Ischemic CM.  - Echo 1/23 EF 20-25%, RV ok - LHC (1/23): w/ severe MVCAD. RHC w/ normal filling pressures and preserved CO 5.4 L/min, cardiac index 2.88 - Echo (3/23): improved EF 50%, mild LVH, RV ok, mild AI - cMRI (3/23): LVEF 47%, RVEF 48%, viable multi-vessel disease, moderate AI - s/p CABG 4/23 - Echo 6/23 EF  normalized, 55-60% G2DD Aov ok   - Echo (3/24): EF 55-60%, RV ok - Echo (09/24): EF 60-65%, RV okay - Echo 10/25 EF 55-60% AoV ok  - Doing well. NYHA I-II - GDMT limited by hyperkalemia and CKD - Continue Farxiga  10 mg daily - Not on spiro  with CKD and hyperkalemia. - Off losartan  with worsening renal function and hyperkalemia (failed x 2). Would not retry  - off ? blocker w/ h/o low BP and bradycardia    2. CAD - NSTEMI 1/23, LHC w/ severe, diffuse MVCAD--> medically managed. - NSTEMI 3/23, s/p CABG x 4, LIMA to LAD, SVG to diagonal, SVG to OM, SVG to distal RCA and AVR - No s/s  angina - Continue ASA 81 + atorva 80 daily  - Goal LDL < 70  Managed by PCP   3. PAF - Remains in NSR. Continue Eliquis  - No bleeding  4. Aortic Stenosis  - s/p aortic valve replacement, stable on echo 09/24 - We discussed SBE prophylaxis. Refill amox  5. CKD IIIb - most recent baseline SCr ~2.6-2.8 - on SGLT2i - Follows with [facility]   6. Type 2DM - followed by PCP  - Continue SGLT2i   7. HLD  - Continue atorva 80 mg - Followed by PCP  8. Remote Tobacco Use - Quit 40 years ago  10. Hypothyroidism - suspect contributing to fatigue - PCP following and levothyroxine  dose has just been increased by PCP    Can graduate HF Clinic and return care to Dr. Jeffrie.   Toribio Fuel, MD  10:15 AM "

## 2024-05-03 ENCOUNTER — Encounter (HOSPITAL_BASED_OUTPATIENT_CLINIC_OR_DEPARTMENT_OTHER): Attending: General Surgery | Admitting: General Surgery

## 2024-05-28 ENCOUNTER — Other Ambulatory Visit (HOSPITAL_COMMUNITY): Payer: Self-pay | Admitting: Internal Medicine

## 2024-05-31 ENCOUNTER — Other Ambulatory Visit: Payer: Self-pay | Admitting: Physician Assistant

## 2024-05-31 DIAGNOSIS — D649 Anemia, unspecified: Secondary | ICD-10-CM

## 2024-05-31 DIAGNOSIS — Z8711 Personal history of peptic ulcer disease: Secondary | ICD-10-CM

## 2024-05-31 DIAGNOSIS — K259 Gastric ulcer, unspecified as acute or chronic, without hemorrhage or perforation: Secondary | ICD-10-CM

## 2024-05-31 DIAGNOSIS — K922 Gastrointestinal hemorrhage, unspecified: Secondary | ICD-10-CM
